# Patient Record
Sex: Male | Born: 1955
Health system: Southern US, Community
[De-identification: ages and names within clinical notes are randomized; demographics above are authoritative.]

## PROBLEM LIST (undated history)

## (undated) DIAGNOSIS — C259 Malignant neoplasm of pancreas, unspecified: Secondary | ICD-10-CM

## (undated) DIAGNOSIS — I2510 Atherosclerotic heart disease of native coronary artery without angina pectoris: Secondary | ICD-10-CM

## (undated) DIAGNOSIS — I4891 Unspecified atrial fibrillation: Secondary | ICD-10-CM

## (undated) DIAGNOSIS — Z9289 Personal history of other medical treatment: Secondary | ICD-10-CM

## (undated) DIAGNOSIS — I639 Cerebral infarction, unspecified: Secondary | ICD-10-CM

## (undated) DIAGNOSIS — I219 Acute myocardial infarction, unspecified: Secondary | ICD-10-CM

## (undated) HISTORY — DX: Personal history of other medical treatment: Z92.89

---
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
## 1998-07-24 ENCOUNTER — Emergency Department (HOSPITAL_COMMUNITY): Admission: EM | Admit: 1998-07-24 | Discharge: 1998-07-24 | Payer: Self-pay

## 1998-12-23 ENCOUNTER — Inpatient Hospital Stay (HOSPITAL_COMMUNITY): Admission: EM | Admit: 1998-12-23 | Discharge: 1998-12-27 | Payer: Self-pay | Admitting: Emergency Medicine

## 1998-12-23 ENCOUNTER — Encounter: Payer: Self-pay | Admitting: Emergency Medicine

## 1999-01-17 ENCOUNTER — Encounter (HOSPITAL_COMMUNITY): Admission: RE | Admit: 1999-01-17 | Discharge: 1999-04-17 | Payer: Self-pay | Admitting: Interventional Cardiology

## 1999-03-20 HISTORY — PX: CORONARY STENT PLACEMENT: SHX1402

## 1999-04-18 ENCOUNTER — Encounter (HOSPITAL_COMMUNITY): Admission: RE | Admit: 1999-04-18 | Discharge: 1999-06-05 | Payer: Self-pay | Admitting: Interventional Cardiology

## 2014-04-19 DIAGNOSIS — I639 Cerebral infarction, unspecified: Secondary | ICD-10-CM

## 2014-04-19 HISTORY — DX: Cerebral infarction, unspecified: I63.9

## 2014-05-04 ENCOUNTER — Inpatient Hospital Stay (HOSPITAL_COMMUNITY): Payer: BC Managed Care – PPO

## 2014-05-04 ENCOUNTER — Encounter (HOSPITAL_COMMUNITY): Payer: Self-pay | Admitting: Radiology

## 2014-05-04 ENCOUNTER — Emergency Department (HOSPITAL_COMMUNITY): Payer: BC Managed Care – PPO

## 2014-05-04 ENCOUNTER — Inpatient Hospital Stay (HOSPITAL_COMMUNITY)
Admission: EM | Admit: 2014-05-04 | Discharge: 2014-05-05 | DRG: 066 | Disposition: A | Discharge status: Home / Self Care | Payer: BC Managed Care – PPO | Attending: Internal Medicine | Admitting: Internal Medicine

## 2014-05-04 DIAGNOSIS — I252 Old myocardial infarction: Secondary | ICD-10-CM | POA: Diagnosis not present

## 2014-05-04 DIAGNOSIS — I4891 Unspecified atrial fibrillation: Secondary | ICD-10-CM | POA: Diagnosis present

## 2014-05-04 DIAGNOSIS — I6789 Other cerebrovascular disease: Secondary | ICD-10-CM

## 2014-05-04 DIAGNOSIS — Z7982 Long term (current) use of aspirin: Secondary | ICD-10-CM

## 2014-05-04 DIAGNOSIS — G459 Transient cerebral ischemic attack, unspecified: Secondary | ICD-10-CM | POA: Diagnosis present

## 2014-05-04 DIAGNOSIS — I25119 Atherosclerotic heart disease of native coronary artery with unspecified angina pectoris: Secondary | ICD-10-CM | POA: Diagnosis present

## 2014-05-04 DIAGNOSIS — Z955 Presence of coronary angioplasty implant and graft: Secondary | ICD-10-CM | POA: Diagnosis not present

## 2014-05-04 DIAGNOSIS — Z9660 Presence of unspecified orthopedic joint implant: Secondary | ICD-10-CM | POA: Diagnosis present

## 2014-05-04 DIAGNOSIS — I10 Essential (primary) hypertension: Secondary | ICD-10-CM | POA: Diagnosis present

## 2014-05-04 DIAGNOSIS — E780 Pure hypercholesterolemia: Secondary | ICD-10-CM | POA: Diagnosis not present

## 2014-05-04 DIAGNOSIS — I63412 Cerebral infarction due to embolism of left middle cerebral artery: Secondary | ICD-10-CM

## 2014-05-04 DIAGNOSIS — I639 Cerebral infarction, unspecified: Principal | ICD-10-CM | POA: Diagnosis present

## 2014-05-04 DIAGNOSIS — I63411 Cerebral infarction due to embolism of right middle cerebral artery: Secondary | ICD-10-CM

## 2014-05-04 DIAGNOSIS — E785 Hyperlipidemia, unspecified: Secondary | ICD-10-CM | POA: Diagnosis present

## 2014-05-04 DIAGNOSIS — Z8673 Personal history of transient ischemic attack (TIA), and cerebral infarction without residual deficits: Secondary | ICD-10-CM | POA: Diagnosis present

## 2014-05-04 DIAGNOSIS — I4819 Other persistent atrial fibrillation: Secondary | ICD-10-CM

## 2014-05-04 DIAGNOSIS — E875 Hyperkalemia: Secondary | ICD-10-CM

## 2014-05-04 HISTORY — DX: Acute myocardial infarction, unspecified: I21.9

## 2014-05-04 HISTORY — DX: Cerebral infarction, unspecified: I63.9

## 2014-05-04 HISTORY — DX: Atherosclerotic heart disease of native coronary artery without angina pectoris: I25.10

## 2014-05-04 LAB — DIFFERENTIAL
BASOS PCT: 1 % (ref 0–1)
Basophils Absolute: 0.1 10*3/uL (ref 0.0–0.1)
EOS ABS: 0.9 10*3/uL — AB (ref 0.0–0.7)
Eosinophils Relative: 9 % — ABNORMAL HIGH (ref 0–5)
Lymphocytes Relative: 36 % (ref 12–46)
Lymphs Abs: 3.5 10*3/uL (ref 0.7–4.0)
MONO ABS: 0.9 10*3/uL (ref 0.1–1.0)
MONOS PCT: 10 % (ref 3–12)
Neutro Abs: 4.3 10*3/uL (ref 1.7–7.7)
Neutrophils Relative %: 44 % (ref 43–77)

## 2014-05-04 LAB — I-STAT CHEM 8, ED
BUN: 13 mg/dL (ref 6–23)
Calcium, Ion: 1.06 mmol/L — ABNORMAL LOW (ref 1.13–1.30)
Chloride: 99 mmol/L (ref 96–112)
Creatinine, Ser: 0.8 mg/dL (ref 0.50–1.35)
Glucose, Bld: 129 mg/dL — ABNORMAL HIGH (ref 70–99)
HCT: 44 % (ref 39.0–52.0)
Hemoglobin: 15 g/dL (ref 13.0–17.0)
Potassium: 3.6 mmol/L (ref 3.5–5.1)
Sodium: 142 mmol/L (ref 135–145)
TCO2: 28 mmol/L (ref 0–100)

## 2014-05-04 LAB — URINALYSIS, ROUTINE W REFLEX MICROSCOPIC
BILIRUBIN URINE: NEGATIVE
Glucose, UA: NEGATIVE mg/dL
Hgb urine dipstick: NEGATIVE
Ketones, ur: NEGATIVE mg/dL
LEUKOCYTES UA: NEGATIVE
NITRITE: NEGATIVE
Protein, ur: NEGATIVE mg/dL
Specific Gravity, Urine: 1.028 (ref 1.005–1.030)
Urobilinogen, UA: 0.2 mg/dL (ref 0.0–1.0)
pH: 8.5 — ABNORMAL HIGH (ref 5.0–8.0)

## 2014-05-04 LAB — COMPREHENSIVE METABOLIC PANEL
ALT: 55 U/L — AB (ref 0–53)
AST: 44 U/L — ABNORMAL HIGH (ref 0–37)
Albumin: 3.8 g/dL (ref 3.5–5.2)
Alkaline Phosphatase: 41 U/L (ref 39–117)
Anion gap: 8 (ref 5–15)
BUN: 12 mg/dL (ref 6–23)
CO2: 31 mmol/L (ref 19–32)
Calcium: 8.9 mg/dL (ref 8.4–10.5)
Chloride: 99 mmol/L (ref 96–112)
Creatinine, Ser: 0.85 mg/dL (ref 0.50–1.35)
GFR calc Af Amer: >90 mL/min (ref >90)
GLUCOSE: 129 mg/dL — AB (ref 70–99)
POTASSIUM: 3.9 mmol/L (ref 3.5–5.1)
Sodium: 138 mmol/L (ref 135–145)
Total Bilirubin: 0.9 mg/dL (ref 0.3–1.2)
Total Protein: 6.4 g/dL (ref 6.0–8.3)

## 2014-05-04 LAB — RAPID URINE DRUG SCREEN, HOSP PERFORMED
Amphetamines: NOT DETECTED
Barbiturates: NOT DETECTED
Benzodiazepines: NOT DETECTED
Cocaine: NOT DETECTED
Opiates: NOT DETECTED
TETRAHYDROCANNABINOL: NOT DETECTED

## 2014-05-04 LAB — LIPID PANEL
CHOLESTEROL: 168 mg/dL (ref 0–200)
HDL: 30 mg/dL — ABNORMAL LOW (ref >39)
LDL CALC: 66 mg/dL (ref 0–99)
TRIGLYCERIDES: 362 mg/dL — AB (ref <150)
Total CHOL/HDL Ratio: 5.6 RATIO
VLDL: 72 mg/dL — ABNORMAL HIGH (ref 0–40)

## 2014-05-04 LAB — CBC
HCT: 48.5 % (ref 39.0–52.0)
HEMOGLOBIN: 15.4 g/dL (ref 13.0–17.0)
MCH: 30.6 pg (ref 26.0–34.0)
MCHC: 31.8 g/dL (ref 30.0–36.0)
MCV: 96.2 fL (ref 78.0–100.0)
PLATELETS: 237 10*3/uL (ref 150–400)
RBC: 5.04 MIL/uL (ref 4.22–5.81)
RDW: 12.8 % (ref 11.5–15.5)
WBC: 9.6 10*3/uL (ref 4.0–10.5)

## 2014-05-04 LAB — TROPONIN I: Troponin I: <0.03 ng/mL (ref <0.031)

## 2014-05-04 LAB — I-STAT TROPONIN, ED: Troponin i, poc: 0 ng/mL (ref 0.00–0.08)

## 2014-05-04 LAB — PROTIME-INR
INR: 0.98 (ref 0.00–1.49)
PROTHROMBIN TIME: 13.1 s (ref 11.6–15.2)

## 2014-05-04 LAB — ETHANOL: Alcohol, Ethyl (B): <5 mg/dL (ref 0–9)

## 2014-05-04 LAB — APTT: aPTT: 31 seconds (ref 24–37)

## 2014-05-04 MED ORDER — SENNOSIDES-DOCUSATE SODIUM 8.6-50 MG PO TABS
1.0000 | ORAL_TABLET | Freq: Every evening | ORAL | Status: DC | PRN
Start: 2014-05-04 — End: 2014-05-05
  Filled 2014-05-04: qty 1

## 2014-05-04 MED ORDER — STROKE: EARLY STAGES OF RECOVERY BOOK
Freq: Once | Status: DC
  Filled 2014-05-04: qty 1

## 2014-05-04 MED ORDER — FLUTICASONE PROPIONATE 50 MCG/ACT NA SUSP
2.0000 | NASAL | Status: DC | PRN
  Administered 2014-05-04: 2 via NASAL
  Filled 2014-05-04 (×2): qty 16

## 2014-05-04 MED ORDER — IOHEXOL 350 MG/ML SOLN
50.0000 mL | Freq: Once | INTRAVENOUS | Status: AC | PRN
  Administered 2014-05-04: 50 mL via INTRAVENOUS

## 2014-05-04 MED ORDER — METOPROLOL TARTRATE 25 MG PO TABS
25.0000 mg | ORAL_TABLET | Freq: Two times a day (BID) | ORAL | Status: DC
  Administered 2014-05-04 (×2): 25 mg via ORAL
  Filled 2014-05-04 (×2): qty 1

## 2014-05-04 MED ORDER — ASPIRIN 325 MG PO TABS
325.0000 mg | ORAL_TABLET | Freq: Every day | ORAL | Status: DC
  Administered 2014-05-04: 325 mg via ORAL
  Filled 2014-05-04: qty 1

## 2014-05-04 MED ORDER — ATORVASTATIN CALCIUM 10 MG PO TABS
10.0000 mg | ORAL_TABLET | Freq: Every day | ORAL | Status: DC
  Administered 2014-05-04: 10 mg via ORAL
  Filled 2014-05-04 (×2): qty 1

## 2014-05-04 MED ORDER — CLOPIDOGREL BISULFATE 75 MG PO TABS: 75.0000 mg | ORAL_TABLET | Freq: Every day | ORAL | Status: DC

## 2014-05-04 MED ORDER — NITROGLYCERIN 0.4 MG SL SUBL: 0.4000 mg | SUBLINGUAL_TABLET | SUBLINGUAL | Status: DC | PRN

## 2014-05-04 MED ORDER — HEPARIN SODIUM (PORCINE) 5000 UNIT/ML IJ SOLN
5000.0000 [IU] | Freq: Three times a day (TID) | INTRAMUSCULAR | Status: DC
  Administered 2014-05-04 (×3): 5000 [IU] via SUBCUTANEOUS
  Filled 2014-05-04 (×4): qty 1

## 2014-05-04 MED ORDER — ASPIRIN 300 MG RE SUPP: 300.0000 mg | Freq: Every day | RECTAL | Status: DC

## 2014-05-04 MED ORDER — ENOXAPARIN SODIUM 30 MG/0.3ML ~~LOC~~ SOLN
30.0000 mg | SUBCUTANEOUS | Status: DC
  Filled 2014-05-04: qty 0.3

## 2014-05-04 NOTE — ED Notes (Addendum)
Pt got up to use the bathroom around 1015 and vomited X 1, pt went back to sleep and then awoken, pt wife reported slurred speech and pt staring off. Pt with R sided deficits and facial droop

## 2014-05-04 NOTE — Consult Note (Signed)
Neurology Consultation Reason for Consult: Aphasia Referring Physician: Margaretha Seeds  CC: Aphasia  History is obtained from:patient, wife  HPI: Casey Pierce is a 59 y.o. male with a history of heart disease who was in his normal state of health on going to bed at 10:15pm and then around 12am was returning from the bathroom and found himself unable to speak. He did complain of having diarrhea earlier in the night.   He states that he takes aspirin as his only blood thinner.  Initially, on presentation to the emergency room he was partially aphasic with right facial droop and discoordination of his right arm. He was taken for CT scan which appears to demonstrate a thrombus in the insular branch of the left MCA, however on CT angiogram appears that this thrombus is moved significantly distally.  This corresponded exactly in time to the patient's clinical improvement with rapid resolution of all symptoms except for mild dysarthria and right hand numbness.   LKW: 10:15 pm 2/15 tpa given?: no, rapidly improving symptoms    ROS: A 14 point ROS was performed and is negative except as noted in the HPI.   PMH: Heart disease High cholesterol  Family History: Grandmother - stroke  Social History: Tob: Denies  Exam: Current vital signs: BP 153/71 mmHg  Pulse 79  Temp(Src) 97.8 F (36.6 C)  Resp 18  Ht 5\' 9"  (1.753 m)  Wt 81.647 kg (180 lb)  BMI 26.57 kg/m2  SpO2 95% Vital signs in last 24 hours: Temp:  [97.8 F (36.6 C)] 97.8 F (36.6 C) (02/16 0147) Pulse Rate:  [79-90] 79 (02/16 0145) Resp:  [18-23] 18 (02/16 0145) BP: (141-153)/(71-77) 153/71 mmHg (02/16 0145) SpO2:  [95 %-98 %] 95 % (02/16 0145) Weight:  [81.647 kg (180 lb)] 81.647 kg (180 lb) (02/16 0125)   Physical Exam  Constitutional: Appears well-developed and well-nourished.  Psych: Affect appropriate to situation Eyes: No scleral injection HENT: No OP obstrucion Head: Normocephalic.  Cardiovascular: Normal rate  and regular rhythm.  Respiratory: Effort normal and breath sounds normal to anterior ascultation GI: Soft.  No distension. There is no tenderness.  Skin: WDI   The following is a post-improvement exam. Neuro: Mental Status: Patient is awake, alert, oriented to person, place, month, year, and situation. Patient is able to give a clear and coherent history. No signs of aphasia or neglect He has mild dysarthria Cranial Nerves: II: Visual Fields are full. Pupils are equal, round, and reactive to light.   III,IV, VI: EOMI without ptosis or diploplia.  V: Facial sensation is symmetric to temperature VII: Facial movement is symmetric.  VIII: hearing is intact to voice X: Uvula elevates symmetrically XI: Shoulder shrug is symmetric. XII: tongue is midline without atrophy or fasciculations.  Motor: Tone is normal. Bulk is normal. 5/5 strength was present in all four extremities.  Sensory: Sensation is decreased to pin in the right hand.  Plantars: Toes are downgoing bilaterally.  Cerebellar: FNF and HKS are intact bilaterally   I have reviewed labs in epic and the results pertinent to this consultation are: Chem 8 - nml creatinine  I have reviewed the images obtained: CT head - hyperdense mca branch  Impression: 59 year old male with likely embolic transient ischemic attack versus mild stroke. He will need a workup for possible sources of emboli. He was on aspirin prior to arrival.  Recommendations: 1. HgbA1c, fasting lipid panel 2. MRI of the brain without MRA 3. Frequent neuro checks 4. Echocardiogram 5. Carotid Dopplers  are not needed given the CT angiogram 6. Prophylactic therapy-Antiplatelet med: Aspirin - dose 325mg  PO or 300mg  PR 7. Risk factor modification 8. Telemetry monitoring 9. PT consult, OT consult, Speech consult    Roland Rack, MD [facility] [phone]  If 7pm- 7am, please page neurology on call as listed in [place].

## 2014-05-04 NOTE — Progress Notes (Signed)
Pt arrived to 4N14 alert and oriented X4. Tele placed and CCMD notified. Oriented to room and questions answered. Will continue to monitor. Bobbye Charleston, RN

## 2014-05-04 NOTE — Progress Notes (Signed)
Echocardiogram 2D Echocardiogram has been performed.  Casey Pierce 05/04/2014, 9:36 AM

## 2014-05-04 NOTE — Code Documentation (Addendum)
Casey Pierce is a 59yo wm presenting to the ED with c/o garbled speech after an episode of N/V/D.  He got in bed around 2215 and got up at midnight to use the restroom.  Casey Pierce had an episode of V/D & he family noticed his speech was garbled & he seemed weak on th right side.  Initially he was an NIH 5 but began to make improvements and by the time he got out of the scanner he had improved.

## 2014-05-04 NOTE — Progress Notes (Signed)
STROKE TEAM PROGRESS NOTE   HISTORY Casey Pierce is a 59 y.o. male with a history of heart disease who was in his normal state of health on going to bed at 10:15pm and then around 12am was returning from the bathroom and found himself unable to speak. He did complain of having diarrhea earlier in the night.   He states that he takes aspirin as his only blood thinner.  Initially, on presentation to the emergency room he was partially aphasic with right facial droop and discoordination of his right arm. He was taken for CT scan which appears to demonstrate a thrombus in the insular branch of the left MCA, however on CT angiogram appears that this thrombus is moved significantly distally.  This corresponded exactly in time to the patient's clinical improvement with rapid resolution of all symptoms except for mild dysarthria and right hand numbness.  LKW: 10:15 pm 2/15 tpa given?: no, rapidly improving symptoms  SUBJECTIVE (INTERVAL HISTORY) His wife is at the bedside.  Overall he feels his condition has improved and resolved. He stated that he had an MRI 16 years ago, no palpitation or heart racing. Denies weight loss, abdominal pain, chest pain, GI bleeding.  OBJECTIVE Temp:  [97.8 F (36.6 C)-98.8 F (37.1 C)] 98.4 F (36.9 C) (02/16 1047) Pulse Rate:  [64-90] 64 (02/16 1047) Cardiac Rhythm:  [-]  Resp:  [14-23] 14 (02/16 1047) BP: (120-153)/(61-85) 128/75 mmHg (02/16 1047) SpO2:  [93 %-98 %] 95 % (02/16 1047) Weight:  [81.647 kg (180 lb)] 81.647 kg (180 lb) (02/16 0125)  No results for input(s): GLUCAP in the last 168 hours.  Recent Labs Lab 05/04/14 0110 05/04/14 0112  NA 138 142  K 3.9 3.6  CL 99 99  CO2 31  --   GLUCOSE 129* 129*  BUN 12 13  CREATININE 0.85 0.80  CALCIUM 8.9  --     Recent Labs Lab 05/04/14 0110  AST 44*  ALT 55*  ALKPHOS 41  BILITOT 0.9  PROT 6.4  ALBUMIN 3.8    Recent Labs Lab 05/04/14 0110 05/04/14 0112  WBC 9.6  --   NEUTROABS 4.3   --   HGB 15.4 15.0  HCT 48.5 44.0  MCV 96.2  --   PLT 237  --     Recent Labs Lab 05/04/14 0110  TROPONINI <0.03    Recent Labs  05/04/14 0110  LABPROT 13.1  INR 0.98    Recent Labs  05/04/14 0315  COLORURINE YELLOW  LABSPEC 1.028  PHURINE 8.5*  GLUCOSEU NEGATIVE  HGBUR NEGATIVE  BILIRUBINUR NEGATIVE  KETONESUR NEGATIVE  PROTEINUR NEGATIVE  UROBILINOGEN 0.2  NITRITE NEGATIVE  LEUKOCYTESUR NEGATIVE       Component Value Date/Time   CHOL 168 05/04/2014 0110   TRIG 362* 05/04/2014 0110   HDL 30* 05/04/2014 0110   CHOLHDL 5.6 05/04/2014 0110   VLDL 72* 05/04/2014 0110   LDLCALC 66 05/04/2014 0110   No results found for: HGBA1C    Component Value Date/Time   LABOPIA NONE DETECTED 05/04/2014 0315   COCAINSCRNUR NONE DETECTED 05/04/2014 0315   LABBENZ NONE DETECTED 05/04/2014 0315   AMPHETMU NONE DETECTED 05/04/2014 0315   THCU NONE DETECTED 05/04/2014 0315   LABBARB NONE DETECTED 05/04/2014 0315     Recent Labs Lab 05/04/14 0110  ETH <5    Ct Angio head and Neck W/cm &/or Wo/cm  05/04/2014  IMPRESSION: CT head: Insular. Sign concerning for LEFT MCA thrombus without large vessel occlusion.  CTA  neck: Mild intimal thickening and calcific atherosclerosis without hemodynamically significant stenosis.  CTA head: Tiny left M3 branch occlusion, this appears to suggest migration of the M3 thrombus during the examination.    Mri and Mra Head/brain Wo Cm  05/04/2014   IMPRESSION: MRI HEAD: 2 subcentimeter foci of acute ischemia in LEFT middle cerebral artery territory. Linear susceptibility artifact in LEFT M3 branch consistent with thromboembolic disease.  Otherwise normal MRI of the brain for age.  MRA HEAD: Thready irregular LEFT M3 segment and slightly decreased distal LEFT middle cerebral artery branches. No large vessel occlusion.    Echo: - Left ventricle: The cavity size was normal. Wall thickness was normal. Systolic function was normal. The estimated  ejection fraction was in the range of 50% to 55%. Wall motion was normal; there were no regional wall motion abnormalities. Left ventricular diastolic function parameters were normal. - Atrial septum: No defect or patent foramen ovale was identified.  Impressions: - No cardiac source of emboli was indentified.  TEE: Pending  LE venous doppler - pending  EKG: Pending  PHYSICAL EXAM Constitutional: Appears well-developed and well-nourished.  Psych: Affect appropriate to situation Eyes: No scleral injection HENT: No OP obstrucion Head: Normocephalic.  Cardiovascular: Normal rate and regular rhythm.  Respiratory: Effort normal and breath sounds normal to anterior ascultation GI: Soft. No distension. There is no tenderness.  Skin: WDI  Neuro: Mental Status: Patient is awake, alert, oriented to person, place, month, year, and situation. Patient is able to give a clear and coherent history. No signs of aphasia or neglect He has mild dysarthria Cranial Nerves: II: Visual Fields are full. Pupils are equal, round, and reactive to light.  III,IV, VI: EOMI without ptosis or diploplia.  V: Facial sensation is symmetric to temperature VII: Facial movement is symmetric.  VIII: hearing is intact to voice X: Uvula elevates symmetrically XI: Shoulder shrug is symmetric. XII: tongue is midline without atrophy or fasciculations.  Motor: Tone is normal. Bulk is normal. 5/5 strength was present in all four extremities.  Sensory: Sensation is decreased to pin in the right hand.  Plantars: Toes are downgoing bilaterally.  Cerebellar: FNF and HKS are intact bilaterally Gait normal  ASSESSMENT/PLAN [name] is a 59 y.o. male with history of MI status post stent in 2000 presenting with aphasia with right facial droop and discoordination of his right arm. He did not receive IV t-PA due to rapidly improving symptoms.   Stroke - small acute infarct at the left  insular cortex, consistent with embolic stroke, source unclear.     MRI as above  MRA - Linear susceptibility artifact in LEFT M3 branch consistent with thromboembolic disease.  2D Echo  unremarkable  LE venous Doppler pending  TEE with Loop Pending. NPO after midnight.  LDL 66, at the goal  HgbA1c pending  Heparin for VTE prophylaxis  Diet Heart  Diet NPO time specified thin liquids  aspirin 81 mg orally every day prior to admission, now on clopidogrel 75 mg orally every day. Continue Plavix at discharge  Patient counseled to be compliant with his antithrombotic medications  Ongoing aggressive stroke risk factor management  Therapy recommendations:  Pending   Disposition:  Pending   Hypertension  Home meds:  Metoprolol  Stable  Patient counseled to be compliant with his blood pressure medications  Hyperlipidemia  Home meds:  Lipitor 20 daily resumed in hospital  LDL 66, goal < 70  Under control  Continue statin at discharge  Other Stroke  Risk Factors  Coronary artery disease s/p stent placement  Hospital day # 0  Patient seen and discussed with Dr. Lerry Liner, PA-C Department of Neurology [facility].  I, the attending vascular neurologist, have personally obtained a history, examined the patient, evaluated laboratory data, individually viewed imaging studies and agree with radiology interpretations. Together with the NP/PA, we formulated the assessment and plan of care which reflects our mutual decision.  I have made any additions or clarifications directly to the above note and agree with the findings and plan as currently documented.   59 year old male with history of CAD status post stenting presented with aphasia with right-sided deficit. MRI showed left insular cortex infarct, consistent with embolic stroke. CTA neck did not show significant carotid artery stenosis. Concerning for cardioembolic stroke. Will do TEE with  potential loop recorder. Pending lower extremity DVT and A1c. On Plavix and Lipitor.  Rosalin Hawking, MD PhD Stroke Neurology 05/04/2014 4:27 PM    To contact Stroke Continuity provider, please refer to [email or web address] After hours, contact General Neurology

## 2014-05-04 NOTE — Progress Notes (Signed)
PT Cancellation Note  Patient Details Name: Casey Pierce MRN: [id number] DOB: 09-May-1955   Cancelled Treatment:    Reason Eval/Treat Not Completed: Pt currently on bedrest until 14:22 today. Also, PT orders are to begin 05/05/14 0700.  MD, if appropriate, please update activity orders AND start time for PT evaluation.    Daysha Ashmore 05/04/2014, 8:05 AM Pager [phone]

## 2014-05-04 NOTE — Progress Notes (Signed)
Patient seen and examined, patient admitted by Dr. Ernestina Patches this morning  Brief history 59 year old male admitted with slurred speech, right facial droop and numbness of his right arm. CT scan showed thrombus in the insular branch of left MCA MRI showed 2 subcentimeter foci of acute ischemia in the left middle cerebral artery territory.  BP 115/76 mmHg  Pulse 60  Temp(Src) 98.2 F (36.8 C) (Oral)  Resp 16  Ht 5\' 9"  (1.753 m)  Wt 81.647 kg (180 lb)  BMI 26.57 kg/m2  SpO2 96%   Patient seen and examined, and agree with assessment and plan per Dr. Ernestina Patches Neurology consulted, continue stroke Workup Discussed with patient and his wife at the bedside in detail.   Becket Wecker M.D. [facility] 05/04/2014, 12:51 PM  Pager: [phone]

## 2014-05-04 NOTE — ED Provider Notes (Signed)
CSN: [id number]     Arrival date & time 05/04/14  0106 History  This chart was scribed for Kalman Drape, MD by Randa Evens, ED Scribe. This patient was seen in room D33C/D33C and the patient's care was started at 1:10 AM.     Chief Complaint  Patient presents with  . Code Stroke   The history is provided by the EMS personnel. No language interpreter was used.   HPI Comments: Casey Pierce is a 59 y.o. male brought in by ambulance, who presents to the Emergency Department for stroke like symptoms. Per EMS pt was last seen normal at 12 AM 1 hour PTA. EMS states that pt started out with nausea and vomiting. EMS states that shortly after pt became non verbal and any time he was asked to speak he would speak in [place]. EMS states that he showed weakness in his right hand as well. Per wife, she last spoke to him around 1045.  Pt reports he knew just before going to bed that something was not right.  Pt had n/v x 1.  Prior h/o CAD/MI 16 years ago.  Pt with left facial droop, left sided weakness, and word finding difficulty.  No prior h/o stroke.     History reviewed. No pertinent past medical history. No past surgical history on file. No family history on file. History  Substance Use Topics  . Smoking status: Never Smoker   . Smokeless tobacco: Not on file  . Alcohol Use: No    Review of Systems  Neurological: Positive for speech difficulty and weakness.    Allergies  Review of patient's allergies indicates no known allergies.  Home Medications   Prior to Admission medications   Medication Sig Start Date End Date Taking? Authorizing Provider  aspirin EC 81 MG tablet Take 81 mg by mouth daily.   Yes Historical Provider, MD  atorvastatin (LIPITOR) 20 MG tablet Take 10 mg by mouth daily.   Yes Historical Provider, MD  metoprolol tartrate (LOPRESSOR) 25 MG tablet Take 25 mg by mouth 2 (two) times daily.   Yes Historical Provider, MD  nitroGLYCERIN (NITROSTAT) 0.4 MG SL tablet Place 0.4  mg under the tongue every 5 (five) minutes as needed for chest pain.   Yes Historical Provider, MD  Omega-3 Fatty Acids (FISH OIL PO) Take 1 tablet by mouth daily.   Yes Historical Provider, MD   Triage Vitals: Pulse 90  Resp 18  Ht 5\' 9"  (1.753 m)  Wt 180 lb (81.647 kg)  BMI 26.57 kg/m2  SpO2 98%   Physical Exam  Constitutional: He is oriented to person, place, and time. He appears well-developed and well-nourished. He appears distressed (anxious appearing).  HENT:  Head: Normocephalic and atraumatic.  Right Ear: External ear normal.  Left Ear: External ear normal.  Nose: Nose normal.  Mouth/Throat: Oropharynx is clear and moist.  Eyes: Conjunctivae and EOM are normal. Pupils are equal, round, and reactive to light.  Neck: Normal range of motion. Neck supple. No JVD present. No tracheal deviation present. No thyromegaly present.  Cardiovascular: Normal rate, regular rhythm, normal heart sounds and intact distal pulses.  Exam reveals no gallop and no friction rub.   No murmur heard. Pulmonary/Chest: Effort normal and breath sounds normal. No stridor. No respiratory distress. He has no wheezes. He has no rales. He exhibits no tenderness.  Abdominal: Soft. Bowel sounds are normal. He exhibits no distension and no mass. There is no tenderness. There is no rebound and no  guarding.  Musculoskeletal: Normal range of motion. He exhibits no edema or tenderness.  Lymphadenopathy:    He has no cervical adenopathy.  Neurological: He is alert and oriented to person, place, and time. He displays normal reflexes. A cranial nerve deficit (right sided facial droop) is present. He exhibits normal muscle tone. Coordination (right sided weakness 3/5) abnormal.  Slurred speech, word finding difficulty  Skin: Skin is warm and dry. No rash noted. No erythema. No pallor.  Psychiatric: He has a normal mood and affect. His behavior is normal. Judgment and thought content normal.  Nursing note and vitals  reviewed.   ED Course  Procedures (including critical care time) DIAGNOSTIC STUDIES: Oxygen Saturation is 98% on RA, normal by my interpretation.    COORDINATION OF CARE:    Labs Review Labs Reviewed  DIFFERENTIAL - Abnormal; Notable for the following:    Eosinophils Relative 9 (*)    Eosinophils Absolute 0.9 (*)    All other components within normal limits  COMPREHENSIVE METABOLIC PANEL - Abnormal; Notable for the following:    Glucose, Bld 129 (*)    AST 44 (*)    ALT 55 (*)    All other components within normal limits  I-STAT CHEM 8, ED - Abnormal; Notable for the following:    Glucose, Bld 129 (*)    Calcium, Ion 1.06 (*)    All other components within normal limits  ETHANOL  PROTIME-INR  APTT  CBC  TROPONIN I  URINE RAPID DRUG SCREEN (HOSP PERFORMED)  URINALYSIS, ROUTINE W REFLEX MICROSCOPIC  HEMOGLOBIN A1C  LIPID PANEL  CBC  CREATININE, SERUM  I-STAT TROPOININ, ED  I-STAT TROPOININ, ED  I-STAT CHEM 8, ED    Imaging Review Ct Angio Head W/cm &/or Wo Cm  05/04/2014   CLINICAL DATA:  Nausea and vomiting beginning 1 hour prior to arrival in the ER, subsequently nonverbal then altered, RIGHT hand weakness.  EXAM: CT ANGIOGRAPHY HEAD AND NECK  TECHNIQUE: Multidetector CT imaging of the head and neck was performed using the standard protocol during bolus administration of intravenous contrast. Multiplanar CT image reconstructions and MIPs were obtained to evaluate the vascular anatomy. Carotid stenosis measurements (when applicable) are obtained utilizing NASCET criteria, using the distal internal carotid diameter as the denominator.  CONTRAST:  37mL OMNIPAQUE IOHEXOL 350 MG/ML SOLN  COMPARISON:  None.  FINDINGS: CT HEAD  The ventricles and sulci are normal. No intraparenchymal hemorrhage, mass effect nor midline shift. No acute large vascular territory infarcts. LEFT insular. Spine, axial 14/32.  No abnormal extra-axial fluid collections. Basal cisterns are patent.  No  skull fracture. The included ocular globes and orbital contents are non-suspicious ; Bilateral drusen. Paranasal sinus mucosal thickening without air-fluid levels. Mastoid air cells are well aerated.  CTA NECK  Normal appearance of the thoracic arch, normal branch pattern. The origins of the innominate, left Common carotid artery and subclavian artery are widely patent.  Bilateral Common carotid arteries are widely patent, coursing in a straight line fashion. Eccentric RIGHT greater LEFT intimal thickening of the distal Common carotid arteries, trace calcific atherosclerosis of the LEFT carotid bulb. Normal appearance of the carotid bifurcations without hemodynamically significant stenosis by NASCET criteria. Normal appearance of the included internal carotid arteries. Bilateral tonsillar loops.  RIGHT vertebral artery is dominant. Normal appearance of the vertebral arteries, which appear widely patent. Mild extrinsic compression due to uncovertebral hypertrophy and facet arthropathy.  No hemodynamically significant stenosis by NASCET criteria. No dissection, no pseudoaneurysm. No abnormal luminal  irregularity. No contrast extravasation.  Fatty replaced parotid glands with small intra parotid lymph nodes. No acute osseous process though bone windows have not been submitted.  CTA HEAD  Anterior circulation: Normal appearance of the cervical internal carotid arteries, petrous, cavernous and supra clinoid internal carotid arteries. Mild calcific atherosclerosis of the carotid siphons. Widely patent anterior communicating artery. Normal appearance of the anterior cerebral arteries. Focally occluded branch of left M3 branch.  Posterior circulation: RIGHT vertebral artery is dominant with normal appearance of the vertebral arteries, vertebrobasilar junction and basilar artery, as well as main branch vessels. RIGHT P1 infundibulum. LEFT vertebral artery terminates in the LEFT posterior-inferior cerebellar artery. Robust  RIGHT posterior communicating artery present. Normal appearance of the posterior cerebral arteries.  No large vessel occlusion, hemodynamically significant stenosis, dissection, luminal irregularity, contrast extravasation or aneurysm within the anterior nor posterior circulation.  IMPRESSION: CT head: Insular. Sign concerning for LEFT MCA thrombus without large vessel occlusion.  CTA neck: Mild intimal thickening and calcific atherosclerosis without hemodynamically significant stenosis.  CTA head: Tiny left M3 branch occlusion, this appears to suggest migration of the M3 thrombus during the examination.  Acute findings discussed with and reconfirmed by Dr.Kirkpatrick on 05/04/2014 at 1:53 am.   Electronically Signed   By: Elon Alas   On: 05/04/2014 01:54   Ct Angio Neck W/cm &/or Wo/cm  05/04/2014   CLINICAL DATA:  Nausea and vomiting beginning 1 hour prior to arrival in the ER, subsequently nonverbal then altered, RIGHT hand weakness.  EXAM: CT ANGIOGRAPHY HEAD AND NECK  TECHNIQUE: Multidetector CT imaging of the head and neck was performed using the standard protocol during bolus administration of intravenous contrast. Multiplanar CT image reconstructions and MIPs were obtained to evaluate the vascular anatomy. Carotid stenosis measurements (when applicable) are obtained utilizing NASCET criteria, using the distal internal carotid diameter as the denominator.  CONTRAST:  19mL OMNIPAQUE IOHEXOL 350 MG/ML SOLN  COMPARISON:  None.  FINDINGS: CT HEAD  The ventricles and sulci are normal. No intraparenchymal hemorrhage, mass effect nor midline shift. No acute large vascular territory infarcts. LEFT insular. Spine, axial 14/32.  No abnormal extra-axial fluid collections. Basal cisterns are patent.  No skull fracture. The included ocular globes and orbital contents are non-suspicious ; Bilateral drusen. Paranasal sinus mucosal thickening without air-fluid levels. Mastoid air cells are well aerated.  CTA NECK   Normal appearance of the thoracic arch, normal branch pattern. The origins of the innominate, left Common carotid artery and subclavian artery are widely patent.  Bilateral Common carotid arteries are widely patent, coursing in a straight line fashion. Eccentric RIGHT greater LEFT intimal thickening of the distal Common carotid arteries, trace calcific atherosclerosis of the LEFT carotid bulb. Normal appearance of the carotid bifurcations without hemodynamically significant stenosis by NASCET criteria. Normal appearance of the included internal carotid arteries. Bilateral tonsillar loops.  RIGHT vertebral artery is dominant. Normal appearance of the vertebral arteries, which appear widely patent. Mild extrinsic compression due to uncovertebral hypertrophy and facet arthropathy.  No hemodynamically significant stenosis by NASCET criteria. No dissection, no pseudoaneurysm. No abnormal luminal irregularity. No contrast extravasation.  Fatty replaced parotid glands with small intra parotid lymph nodes. No acute osseous process though bone windows have not been submitted.  CTA HEAD  Anterior circulation: Normal appearance of the cervical internal carotid arteries, petrous, cavernous and supra clinoid internal carotid arteries. Mild calcific atherosclerosis of the carotid siphons. Widely patent anterior communicating artery. Normal appearance of the anterior cerebral arteries. Focally occluded branch  of left M3 branch.  Posterior circulation: RIGHT vertebral artery is dominant with normal appearance of the vertebral arteries, vertebrobasilar junction and basilar artery, as well as main branch vessels. RIGHT P1 infundibulum. LEFT vertebral artery terminates in the LEFT posterior-inferior cerebellar artery. Robust RIGHT posterior communicating artery present. Normal appearance of the posterior cerebral arteries.  No large vessel occlusion, hemodynamically significant stenosis, dissection, luminal irregularity, contrast  extravasation or aneurysm within the anterior nor posterior circulation.  IMPRESSION: CT head: Insular. Sign concerning for LEFT MCA thrombus without large vessel occlusion.  CTA neck: Mild intimal thickening and calcific atherosclerosis without hemodynamically significant stenosis.  CTA head: Tiny left M3 branch occlusion, this appears to suggest migration of the M3 thrombus during the examination.  Acute findings discussed with and reconfirmed by Dr.Kirkpatrick on 05/04/2014 at 1:53 am.   Electronically Signed   By: Elon Alas   On: 05/04/2014 01:54     EKG Interpretation   Date/Time:  Tuesday May 04 2014 01:28:46 EST Ventricular Rate:  87 PR Interval:  239 QRS Duration: 106 QT Interval:  395 QTC Calculation: 475 R Axis:   -58 Text Interpretation:  Sinus rhythm Atrial premature complex Prolonged PR  interval Probable left atrial enlargement Incomplete RBBB and LAFB  Consider right ventricular hypertrophy Borderline prolonged QT interval No  old tracing to compare Confirmed by Jossette Zirbel  MD, Zailey Audia ([id number]) on 05/04/2014  1:30:46 AM     CRITICAL CARE Performed by: Kalman Drape Total critical care time: 30 min Critical care time was exclusive of separately billable procedures and treating other patients. Critical care was necessary to treat or prevent imminent or life-threatening deterioration. Critical care was time spent personally by me on the following activities: development of treatment plan with patient and/or surrogate as well as nursing, discussions with consultants, evaluation of patient's response to treatment, examination of patient, obtaining history from patient or surrogate, ordering and performing treatments and interventions, ordering and review of laboratory studies, ordering and review of radiographic studies, pulse oximetry and re-evaluation of patient's condition.  MDM   Final diagnoses:  Transient cerebral ischemia, unspecified transient cerebral ischemia type     I personally performed the services described in this documentation, which was scribed in my presence. The recorded information has been reviewed and is accurate.  Pt with onset of symptoms around 1045 pm, presents with right sided facial droop, right sided weakness, slurred speech with word finding difficulty.  After CTA neck/head, pt with complete resolution of symptoms.  Not TPA candidate.  Clot seen to move from Left MCA to M3 distribution on two head CTs.  To be admitted to hospitalist service, Dr Leonel Ramsay has seen in consult.     Kalman Drape, MD 05/04/14 [phone]

## 2014-05-04 NOTE — H&P (Signed)
Hospitalist Admission History and Physical  Patient name: Casey Pierce Medical record number: [id number] Date of birth: Oct 17, 1955 Age: 59 y.o. Gender: male  Primary Care Provider: Irven Shelling, MD  Chief Complaint: CVA History of Present Illness:This is a 59 y.o. year old male with significant past medical history of CAD s/p MI, HLD  presenting with CVA. Per report, pt was found by daughter around midnight non verbal, w/ R sided facial droop and unable to lift up R hand. Symptoms persisted on presentation to the ER. A code stroke was called. Pt had head CT that initially showed L MCA thrombus. However, after IV contrast was given for Head CTA, thrombus had appeared to migrate to small M3 branch w/ subsequent complete resolution of sxs.  Pt denies any prior hx/o CVA in the past. Non smoker. Reports compliance w/ medication at home. No reported illicit drug use. Hemodynamically stable on presentation. CBC, CMET, trop grossly WNL.   Assessment and Plan: Casey Pierce is a 59 y.o. year old male presenting with stroke   Active Problems:   Stroke   TIA (transient ischemic attack)   1- Stroke -focal deficits on exam clinically resolved  -proceed down stroke pathway including MRI, MRA, 2D ECHO, carotid dopplers, risk stratification labs -full dose ASA -f/u neuro recs  2- HTN -BP stable  -cont home regimen   3-HLD -check lipid panel  -cont lipitor  FEN/GI: NPO pending bedside swallow eval  Prophylaxis: lovenox  Disposition: pending further evaluation  Code Status:Full Code    Patient Active Problem List   Diagnosis Date Noted  . Stroke 05/04/2014  . TIA (transient ischemic attack) 05/04/2014   Past Medical History: History reviewed. No pertinent past medical history.  Past Surgical History: Past Surgical History  Procedure Laterality Date  . Joint replacement      Social History: History   Social History  . Marital Status: Married    Spouse Name: N/A  .  Number of Children: N/A  . Years of Education: N/A   Social History Main Topics  . Smoking status: Never Smoker   . Smokeless tobacco: Not on file  . Alcohol Use: No  . Drug Use: Not on file  . Sexual Activity: Not on file   Other Topics Concern  . None   Social History Narrative  . None    Family History: No family history on file.  Allergies: No Known Allergies  Current Facility-Administered Medications  Medication Dose Route Frequency Provider Last Rate Last Dose  .  stroke: mapping our early stages of recovery book   Does not apply Once Shanda Howells, MD      . aspirin suppository 300 mg  300 mg Rectal Daily Shanda Howells, MD       Or  . aspirin tablet 325 mg  325 mg Oral Daily Shanda Howells, MD      . atorvastatin (LIPITOR) tablet 10 mg  10 mg Oral Daily Shanda Howells, MD      . enoxaparin (LOVENOX) injection 30 mg  30 mg Subcutaneous Q24H Shanda Howells, MD      . metoprolol tartrate (LOPRESSOR) tablet 25 mg  25 mg Oral BID Shanda Howells, MD      . nitroGLYCERIN (NITROSTAT) SL tablet 0.4 mg  0.4 mg Sublingual Q5 min PRN Shanda Howells, MD      . senna-docusate (Senokot-S) tablet 1 tablet  1 tablet Oral QHS PRN Shanda Howells, MD       Current Outpatient Prescriptions  Medication Sig Dispense  Refill  . aspirin EC 81 MG tablet Take 81 mg by mouth daily.    Marland Kitchen atorvastatin (LIPITOR) 20 MG tablet Take 10 mg by mouth daily.    . metoprolol tartrate (LOPRESSOR) 25 MG tablet Take 25 mg by mouth 2 (two) times daily.    . nitroGLYCERIN (NITROSTAT) 0.4 MG SL tablet Place 0.4 mg under the tongue every 5 (five) minutes as needed for chest pain.    . Omega-3 Fatty Acids (FISH OIL PO) Take 1 tablet by mouth daily.     Review Of Systems: 12 point ROS negative except as noted above in HPI.  Physical Exam: Filed Vitals:   05/04/14 0230  BP: 134/85  Pulse: 78  Temp:   Resp: 18    General: alert and cooperative HEENT: PERRLA and extra ocular movement intact Heart: S1, S2  normal, no murmur, rub or gallop, regular rate and rhythm Lungs: clear to auscultation, no wheezes or rales and unlabored breathing Abdomen: abdomen is soft without significant tenderness, masses, organomegaly or guarding Extremities: extremities normal, atraumatic, no cyanosis or edema Skin:no rashes, no ecchymoses Neurology: normal without focal findings  Labs and Imaging: Lab Results  Component Value Date/Time   NA 142 05/04/2014 01:12 AM   K 3.6 05/04/2014 01:12 AM   CL 99 05/04/2014 01:12 AM   CO2 31 05/04/2014 01:10 AM   BUN 13 05/04/2014 01:12 AM   CREATININE 0.80 05/04/2014 01:12 AM   GLUCOSE 129* 05/04/2014 01:12 AM   Lab Results  Component Value Date   WBC 9.6 05/04/2014   HGB 15.0 05/04/2014   HCT 44.0 05/04/2014   MCV 96.2 05/04/2014   PLT 237 05/04/2014    Ct Angio Head W/cm &/or Wo Cm  05/04/2014   CLINICAL DATA:  Nausea and vomiting beginning 1 hour prior to arrival in the ER, subsequently nonverbal then altered, RIGHT hand weakness.  EXAM: CT ANGIOGRAPHY HEAD AND NECK  TECHNIQUE: Multidetector CT imaging of the head and neck was performed using the standard protocol during bolus administration of intravenous contrast. Multiplanar CT image reconstructions and MIPs were obtained to evaluate the vascular anatomy. Carotid stenosis measurements (when applicable) are obtained utilizing NASCET criteria, using the distal internal carotid diameter as the denominator.  CONTRAST:  91mL OMNIPAQUE IOHEXOL 350 MG/ML SOLN  COMPARISON:  None.  FINDINGS: CT HEAD  The ventricles and sulci are normal. No intraparenchymal hemorrhage, mass effect nor midline shift. No acute large vascular territory infarcts. LEFT insular. Spine, axial 14/32.  No abnormal extra-axial fluid collections. Basal cisterns are patent.  No skull fracture. The included ocular globes and orbital contents are non-suspicious ; Bilateral drusen. Paranasal sinus mucosal thickening without air-fluid levels. Mastoid air cells  are well aerated.  CTA NECK  Normal appearance of the thoracic arch, normal branch pattern. The origins of the innominate, left Common carotid artery and subclavian artery are widely patent.  Bilateral Common carotid arteries are widely patent, coursing in a straight line fashion. Eccentric RIGHT greater LEFT intimal thickening of the distal Common carotid arteries, trace calcific atherosclerosis of the LEFT carotid bulb. Normal appearance of the carotid bifurcations without hemodynamically significant stenosis by NASCET criteria. Normal appearance of the included internal carotid arteries. Bilateral tonsillar loops.  RIGHT vertebral artery is dominant. Normal appearance of the vertebral arteries, which appear widely patent. Mild extrinsic compression due to uncovertebral hypertrophy and facet arthropathy.  No hemodynamically significant stenosis by NASCET criteria. No dissection, no pseudoaneurysm. No abnormal luminal irregularity. No contrast extravasation.  Fatty replaced  parotid glands with small intra parotid lymph nodes. No acute osseous process though bone windows have not been submitted.  CTA HEAD  Anterior circulation: Normal appearance of the cervical internal carotid arteries, petrous, cavernous and supra clinoid internal carotid arteries. Mild calcific atherosclerosis of the carotid siphons. Widely patent anterior communicating artery. Normal appearance of the anterior cerebral arteries. Focally occluded branch of left M3 branch.  Posterior circulation: RIGHT vertebral artery is dominant with normal appearance of the vertebral arteries, vertebrobasilar junction and basilar artery, as well as main branch vessels. RIGHT P1 infundibulum. LEFT vertebral artery terminates in the LEFT posterior-inferior cerebellar artery. Robust RIGHT posterior communicating artery present. Normal appearance of the posterior cerebral arteries.  No large vessel occlusion, hemodynamically significant stenosis, dissection,  luminal irregularity, contrast extravasation or aneurysm within the anterior nor posterior circulation.  IMPRESSION: CT head: Insular. Sign concerning for LEFT MCA thrombus without large vessel occlusion.  CTA neck: Mild intimal thickening and calcific atherosclerosis without hemodynamically significant stenosis.  CTA head: Tiny left M3 branch occlusion, this appears to suggest migration of the M3 thrombus during the examination.  Acute findings discussed with and reconfirmed by Dr.Kirkpatrick on 05/04/2014 at 1:53 am.   Electronically Signed   By: Elon Alas   On: 05/04/2014 01:54   Ct Angio Neck W/cm &/or Wo/cm  05/04/2014   CLINICAL DATA:  Nausea and vomiting beginning 1 hour prior to arrival in the ER, subsequently nonverbal then altered, RIGHT hand weakness.  EXAM: CT ANGIOGRAPHY HEAD AND NECK  TECHNIQUE: Multidetector CT imaging of the head and neck was performed using the standard protocol during bolus administration of intravenous contrast. Multiplanar CT image reconstructions and MIPs were obtained to evaluate the vascular anatomy. Carotid stenosis measurements (when applicable) are obtained utilizing NASCET criteria, using the distal internal carotid diameter as the denominator.  CONTRAST:  20mL OMNIPAQUE IOHEXOL 350 MG/ML SOLN  COMPARISON:  None.  FINDINGS: CT HEAD  The ventricles and sulci are normal. No intraparenchymal hemorrhage, mass effect nor midline shift. No acute large vascular territory infarcts. LEFT insular. Spine, axial 14/32.  No abnormal extra-axial fluid collections. Basal cisterns are patent.  No skull fracture. The included ocular globes and orbital contents are non-suspicious ; Bilateral drusen. Paranasal sinus mucosal thickening without air-fluid levels. Mastoid air cells are well aerated.  CTA NECK  Normal appearance of the thoracic arch, normal branch pattern. The origins of the innominate, left Common carotid artery and subclavian artery are widely patent.  Bilateral  Common carotid arteries are widely patent, coursing in a straight line fashion. Eccentric RIGHT greater LEFT intimal thickening of the distal Common carotid arteries, trace calcific atherosclerosis of the LEFT carotid bulb. Normal appearance of the carotid bifurcations without hemodynamically significant stenosis by NASCET criteria. Normal appearance of the included internal carotid arteries. Bilateral tonsillar loops.  RIGHT vertebral artery is dominant. Normal appearance of the vertebral arteries, which appear widely patent. Mild extrinsic compression due to uncovertebral hypertrophy and facet arthropathy.  No hemodynamically significant stenosis by NASCET criteria. No dissection, no pseudoaneurysm. No abnormal luminal irregularity. No contrast extravasation.  Fatty replaced parotid glands with small intra parotid lymph nodes. No acute osseous process though bone windows have not been submitted.  CTA HEAD  Anterior circulation: Normal appearance of the cervical internal carotid arteries, petrous, cavernous and supra clinoid internal carotid arteries. Mild calcific atherosclerosis of the carotid siphons. Widely patent anterior communicating artery. Normal appearance of the anterior cerebral arteries. Focally occluded branch of left M3 branch.  Posterior circulation:  RIGHT vertebral artery is dominant with normal appearance of the vertebral arteries, vertebrobasilar junction and basilar artery, as well as main branch vessels. RIGHT P1 infundibulum. LEFT vertebral artery terminates in the LEFT posterior-inferior cerebellar artery. Robust RIGHT posterior communicating artery present. Normal appearance of the posterior cerebral arteries.  No large vessel occlusion, hemodynamically significant stenosis, dissection, luminal irregularity, contrast extravasation or aneurysm within the anterior nor posterior circulation.  IMPRESSION: CT head: Insular. Sign concerning for LEFT MCA thrombus without large vessel occlusion.   CTA neck: Mild intimal thickening and calcific atherosclerosis without hemodynamically significant stenosis.  CTA head: Tiny left M3 branch occlusion, this appears to suggest migration of the M3 thrombus during the examination.  Acute findings discussed with and reconfirmed by Dr.Kirkpatrick on 05/04/2014 at 1:53 am.   Electronically Signed   By: Elon Alas   On: 05/04/2014 01:54           Shanda Howells MD  Pager: [phone]

## 2014-05-05 ENCOUNTER — Encounter (HOSPITAL_COMMUNITY)
Admission: EM | Disposition: A | Discharge status: Home / Self Care | Payer: Self-pay | Source: Home / Self Care | Attending: Internal Medicine

## 2014-05-05 ENCOUNTER — Other Ambulatory Visit: Payer: Self-pay | Admitting: Neurology

## 2014-05-05 ENCOUNTER — Ambulatory Visit (HOSPITAL_COMMUNITY): Admit: 2014-05-05 | Payer: Self-pay | Admitting: Cardiology

## 2014-05-05 ENCOUNTER — Encounter (HOSPITAL_COMMUNITY): Payer: Self-pay

## 2014-05-05 DIAGNOSIS — I480 Paroxysmal atrial fibrillation: Secondary | ICD-10-CM

## 2014-05-05 DIAGNOSIS — I63411 Cerebral infarction due to embolism of right middle cerebral artery: Secondary | ICD-10-CM

## 2014-05-05 DIAGNOSIS — I4819 Other persistent atrial fibrillation: Secondary | ICD-10-CM

## 2014-05-05 LAB — BASIC METABOLIC PANEL
Anion gap: 3 — ABNORMAL LOW (ref 5–15)
BUN: 10 mg/dL (ref 6–23)
CHLORIDE: 105 mmol/L (ref 96–112)
CO2: 28 mmol/L (ref 19–32)
CREATININE: 0.81 mg/dL (ref 0.50–1.35)
Calcium: 8.9 mg/dL (ref 8.4–10.5)
GFR calc non Af Amer: >90 mL/min (ref >90)
GLUCOSE: 112 mg/dL — AB (ref 70–99)
POTASSIUM: 3.8 mmol/L (ref 3.5–5.1)
Sodium: 136 mmol/L (ref 135–145)

## 2014-05-05 LAB — CBC
HCT: 44.3 % (ref 39.0–52.0)
Hemoglobin: 15.3 g/dL (ref 13.0–17.0)
MCH: 30.1 pg (ref 26.0–34.0)
MCHC: 34.5 g/dL (ref 30.0–36.0)
MCV: 87.2 fL (ref 78.0–100.0)
PLATELETS: 230 10*3/uL (ref 150–400)
RBC: 5.08 MIL/uL (ref 4.22–5.81)
RDW: 12.6 % (ref 11.5–15.5)
WBC: 9 10*3/uL (ref 4.0–10.5)

## 2014-05-05 LAB — HEMOGLOBIN A1C
Hgb A1c MFr Bld: 6.3 % — ABNORMAL HIGH (ref 4.8–5.6)
MEAN PLASMA GLUCOSE: 134 mg/dL

## 2014-05-05 SURGERY — ECHOCARDIOGRAM, TRANSESOPHAGEAL
Anesthesia: Moderate Sedation

## 2014-05-05 SURGERY — Surgical Case
Anesthesia: *Unknown

## 2014-05-05 SURGERY — LOOP RECORDER IMPLANT
Anesthesia: LOCAL

## 2014-05-05 MED ORDER — APIXABAN 5 MG PO TABS
5.0000 mg | ORAL_TABLET | Freq: Two times a day (BID) | ORAL | Status: DC
  Administered 2014-05-05: 5 mg via ORAL
  Filled 2014-05-05: qty 1

## 2014-05-05 MED ORDER — APIXABAN 5 MG PO TABS: 5.0000 mg | ORAL_TABLET | Freq: Two times a day (BID) | ORAL | Status: DC

## 2014-05-05 MED ORDER — IBUPROFEN 200 MG PO TABS
800.0000 mg | ORAL_TABLET | Freq: Once | ORAL | Status: AC
  Administered 2014-05-05: 800 mg via ORAL
  Filled 2014-05-05: qty 4

## 2014-05-05 NOTE — Progress Notes (Addendum)
STROKE TEAM PROGRESS NOTE   HISTORY Casey Pierce is a 59 y.o. male with a history of heart disease who was in his normal state of health on going to bed at 10:15pm and then around 12am was returning from the bathroom and found himself unable to speak. He did complain of having diarrhea earlier in the night.   He states that he takes aspirin as his only blood thinner.  Initially, on presentation to the emergency room he was partially aphasic with right facial droop and discoordination of his right arm. He was taken for CT scan which appears to demonstrate a thrombus in the insular branch of the left MCA, however on CT angiogram appears that this thrombus is moved significantly distally.  This corresponded exactly in time to the patient's clinical improvement with rapid resolution of all symptoms except for mild dysarthria and right hand numbness.  LKW: 10:15 pm 2/15 tpa given?: no, rapidly improving symptoms  SUBJECTIVE (INTERVAL HISTORY) His wife is at the bedside.  Overall he feels his condition has resolved. No issues overnight as per patient and nursing staff. EKG today revealed A-fib, his TEE and loop were then cancelled. Put on eliquis.  OBJECTIVE Temp:  [97.5 F (36.4 C)-98.4 F (36.9 C)] 97.5 F (36.4 C) (02/17 1035) Pulse Rate:  [57-78] 76 (02/17 1035) Cardiac Rhythm:  [-] Heart block (02/17 0800) Resp:  [15-18] 16 (02/17 1035) BP: (101-128)/(58-90) 128/90 mmHg (02/17 1035) SpO2:  [95 %-98 %] 97 % (02/17 1035)  No results for input(s): GLUCAP in the last 168 hours.  Recent Labs Lab 05/04/14 0110 05/04/14 0112 05/05/14 0621  NA 138 142 136  K 3.9 3.6 3.8  CL 99 99 105  CO2 31  --  28  GLUCOSE 129* 129* 112*  BUN 12 13 10   CREATININE 0.85 0.80 0.81  CALCIUM 8.9  --  8.9    Recent Labs Lab 05/04/14 0110  AST 44*  ALT 55*  ALKPHOS 41  BILITOT 0.9  PROT 6.4  ALBUMIN 3.8    Recent Labs Lab 05/04/14 0110 05/04/14 0112 05/05/14 0621  WBC 9.6  --  9.0   NEUTROABS 4.3  --   --   HGB 15.4 15.0 15.3  HCT 48.5 44.0 44.3  MCV 96.2  --  87.2  PLT 237  --  230    Recent Labs Lab 05/04/14 0110  TROPONINI <0.03    Recent Labs  05/04/14 0110  LABPROT 13.1  INR 0.98    Recent Labs  05/04/14 0315  COLORURINE YELLOW  LABSPEC 1.028  PHURINE 8.5*  GLUCOSEU NEGATIVE  HGBUR NEGATIVE  BILIRUBINUR NEGATIVE  KETONESUR NEGATIVE  PROTEINUR NEGATIVE  UROBILINOGEN 0.2  NITRITE NEGATIVE  LEUKOCYTESUR NEGATIVE       Component Value Date/Time   CHOL 168 05/04/2014 0110   TRIG 362* 05/04/2014 0110   HDL 30* 05/04/2014 0110   CHOLHDL 5.6 05/04/2014 0110   VLDL 72* 05/04/2014 0110   LDLCALC 66 05/04/2014 0110   Lab Results  Component Value Date   HGBA1C 6.3* 05/04/2014      Component Value Date/Time   LABOPIA NONE DETECTED 05/04/2014 0315   COCAINSCRNUR NONE DETECTED 05/04/2014 0315   LABBENZ NONE DETECTED 05/04/2014 0315   AMPHETMU NONE DETECTED 05/04/2014 0315   THCU NONE DETECTED 05/04/2014 0315   LABBARB NONE DETECTED 05/04/2014 0315     Recent Labs Lab 05/04/14 0110  ETH <5   I have personally reviewed the radiological images below and agree with the  radiology interpretations.  Ct Angio head and Neck W/cm &/or Wo/cm  05/04/2014  IMPRESSION: CT head: Insular. Sign concerning for LEFT MCA thrombus without large vessel occlusion.  CTA neck: Mild intimal thickening and calcific atherosclerosis without hemodynamically significant stenosis.  CTA head: Tiny left M3 branch occlusion, this appears to suggest migration of the M3 thrombus during the examination.    Mri and Mra Head/brain Wo Cm  05/04/2014   IMPRESSION: MRI HEAD: 2 subcentimeter foci of acute ischemia in LEFT middle cerebral artery territory. Linear susceptibility artifact in LEFT M3 branch consistent with thromboembolic disease.  Otherwise normal MRI of the brain for age.  MRA HEAD: Thready irregular LEFT M3 segment and slightly decreased distal LEFT middle  cerebral artery branches. No large vessel occlusion.    Echo: - Left ventricle: The cavity size was normal. Wall thickness was normal. Systolic function was normal. The estimated ejection fraction was in the range of 50% to 55%. Wall motion was normal; there were no regional wall motion abnormalities. Left ventricular diastolic function parameters were normal. - Atrial septum: No defect or patent foramen ovale was identified. Impressions: - No cardiac source of emboli was indentified.  LE venous doppler - Neg  EKG: Afib captured on 05/05/14  PHYSICAL EXAM Constitutional: Appears well-developed and well-nourished.  Psych: Affect appropriate to situation Eyes: No scleral injection HENT: No OP obstrucion Head: Normocephalic.  Cardiovascular: Normal rate and regular rhythm.  Respiratory: Effort normal and breath sounds normal to anterior ascultation GI: Soft. No distension. There is no tenderness.  Skin: WDI  Neuro: Mental Status: Patient is awake, alert, oriented to person, place, month, year, and situation. Patient is able to give a clear and coherent history. No signs of aphasia or neglect He has mild dysarthria Cranial Nerves: II: Visual Fields are full. Pupils are equal, round, and reactive to light.  III,IV, VI: EOMI without ptosis or diploplia.  V: Facial sensation is symmetric to temperature VII: Facial movement is symmetric.  VIII: hearing is intact to voice X: Uvula elevates symmetrically XI: Shoulder shrug is symmetric. XII: tongue is midline without atrophy or fasciculations.  Motor: Tone is normal. Bulk is normal. 5/5 strength was present in all four extremities.  Sensory: Sensation is decreased to pin in the right hand.  Plantars: Toes are downgoing bilaterally.  Cerebellar: FNF and HKS are intact bilaterally Gait normal  ASSESSMENT/PLAN [name] is a 59 y.o. male with history of MI status post stent in 2000 presenting with  aphasia with right facial droop and discoordination of his right arm. He did not receive IV t-PA due to rapidly improving symptoms.   Stroke - small acute infarct at the left insular cortex, consistent with embolic stroke from new onset afib seen on EKG  MRI as above  MRA - Linear susceptibility artifact in LEFT M3 branch consistent with thromboembolic disease.  2D Echo  unremarkable  LE venous Doppler Neg  LDL 66, at the goal  HgbA1c 6.3  Heparin for VTE prophylaxis  Diet Heart thin liquids  aspirin 81 mg orally every day prior to admission, now on eliquis (apixaban). Continue eliquis at discharge  Patient counseled to be compliant with his antithrombotic medications  Ongoing aggressive stroke risk factor management  Therapy recommendations: Home   Disposition:  home  Afib  Found on EKG and tele today  Put on eliqis  D/c ASA  Continue metoprolol for rate control  Hypertension  Home meds:  Metoprolol  Stable  Patient counseled to be compliant  with his blood pressure medications  Hyperlipidemia  Home meds:  Lipitor 20 daily resumed in hospital  LDL 66, goal < 70  Under control  Continue statin at discharge  Put on fish oil for high TG  Other Stroke Risk Factors  Coronary artery disease s/p stent placement  Hospital day # 1  Patient seen and discussed with Dr. Erlinda Hong No other Neurology recommendations at this time. We will sign off.  Lanelle Bal, PA-C Department of Neurology [facility].  05/05/2014 12:28 PM  I, the attending vascular neurologist, have personally obtained a history, examined the patient, evaluated laboratory data, individually viewed imaging studies and agree with radiology interpretations. I also discussed with Dr. Tyrell Antonio and Dr. [name] regarding his care plan. Together with the NP/PA, we formulated the assessment and plan of care which reflects our mutual decision.  I have made any additions or clarifications  directly to the above note and agree with the findings and plan as currently documented.   59 yo M with PMH of HLD and CAD s/p stent admitted for left cortical small stroke, embolic pattern and was due to afib (newly captured on EKG). Change ASA to eliquis. Continue lipitor and metoprolol. Comply with medications. Follow up in clinic.  Neurology will sign off. Please call with questions. Pt will follow up with Dr. Erlinda Hong at [facility] in about 2 months. Thanks for the consult.   Rosalin Hawking, MD PhD Stroke Neurology 05/05/2014 11:02 PM        To contact Stroke Continuity provider, please refer to [email or web address] After hours, contact General Neurology

## 2014-05-05 NOTE — Progress Notes (Signed)
Patient [place] home via car with wife.  DC instructions and prescription were given to patient and fully understood.  Vital signs and assessments were stable.

## 2014-05-05 NOTE — Care Management Note (Signed)
    Page 1 of 1   05/05/2014     3:37:26 PM CARE MANAGEMENT NOTE 05/05/2014  Patient:  Casey Pierce,Casey Pierce   Account Number:  [id number]  Date Initiated:  05/05/2014  Documentation initiated by:  Lorne Skeens  Subjective/Objective Assessment:   Patient was admitted with CVA.  Lives at home with spouse.     Action/Plan:   Will follow for discharge needs pending PT/OT evals and physician orders.   Anticipated DC Date:     Anticipated DC Plan:  [place]         Choice offered to / List presented to:             Status of service:  In process, will continue to follow Medicare Important Message given?   (If response is "NO", the following Medicare IM given date fields will be blank) Date Medicare IM given:   Medicare IM given by:   Date Additional Medicare IM given:   Additional Medicare IM given by:    Discharge Disposition:  HOME/SELF CARE  Per UR Regulation:  Reviewed for med. necessity/level of care/duration of stay  If discussed at [place] of Stay Meetings, dates discussed:    Comments:  05/05/14 Lorne Skeens RN, MSN, CM-  BENEFITS CHECK RESULTS:  [place] is covered. No Prior Authorization required. Retail Pharmacy Co-Payment is $64.00. Available at [facility], [facility], [facility], etc.  05/05/14 Lorne Skeens RN, MSN, CM- Patient was given Eliquis 30 day free trial and $10 copay cards.  Awaiting results of benefits check to determine in prior authorization is required.

## 2014-05-05 NOTE — Consult Note (Signed)
ELECTROPHYSIOLOGY CONSULT NOTE  Patient ID: Casey Pierce MRN: [id number], DOB/AGE: [birth year]   Admit date: 05/04/2014 Date of Consult: 05/05/2014  Primary Physician: Casey Shelling, MD Primary Cardiologist: Casey Pierce: Cryptogenic stroke; recommendations regarding Implantable Loop Recorder  History of Present Illness Casey Pierce was admitted on 05/04/2014 with acute CVA. Telemetry monitoring today demonstrates atrial fibrillation with a controlled ventricular response. He is completely asymptomatic regarding his arrthymia. Neurologic deficits have resolved.  His CHA2DS2 VASc is 2.    Past Medical History Past Medical History  Diagnosis Date  . Coronary artery disease   . Myocardial infarction   . Stroke 04/2014    Past Surgical History Past Surgical History  Procedure Laterality Date  . Joint replacement    . Coronary stent placement  2001    Allergies/Intolerances No Known Allergies Inpatient Medications .  stroke: mapping our early stages of recovery book   Does not apply Once  . atorvastatin  10 mg Oral Daily  . clopidogrel  75 mg Oral Daily  . heparin subcutaneous  5,000 Units Subcutaneous 3 times per day  . metoprolol tartrate  25 mg Oral BID     Social History History   Social History  . Marital Status: Married    Spouse Name: N/A  . Number of Children: N/A  . Years of Education: N/A   Occupational History  . Not on file.   Social History Main Topics  . Smoking status: Never Smoker   . Smokeless tobacco: Former Systems developer    Types: Chew     Comment: quit chewing 7 years ago  . Alcohol Use: No  . Drug Use: No  . Sexual Activity: Not on file   Other Topics Concern  . Not on file   Social History Narrative  . No narrative on file    Review of Systems General: No chills, fever, night sweats or weight changes  Cardiovascular:  No chest pain, dyspnea on exertion, edema, orthopnea, palpitations, paroxysmal nocturnal  dyspnea Dermatological: No rash, lesions or masses Respiratory: No cough, dyspnea Urologic: No hematuria, dysuria Abdominal: No nausea, vomiting, diarrhea, bright red blood per rectum, melena, or hematemesis Neurologic: No visual changes, weakness, changes in mental status All other systems reviewed and are otherwise negative except as noted above.  Physical Exam Blood pressure 128/90, pulse 76, temperature 97.5 F (36.4 C), temperature source Oral, resp. rate 16, height 5\' 9"  (1.753 m), weight 180 lb (81.647 kg), SpO2 97 %.  General: Well developed, well appearing 59 y.o. male in no acute distress. HEENT: Normocephalic, atraumatic. EOMs intact. Sclera nonicteric. Oropharynx clear.  Neck: Supple without bruits. No JVD. Lungs: Respirations regular and unlabored, CTA bilaterally. No wheezes, rales or rhonchi. Heart: RRR. S1, S2 present. No murmurs, rub, S3 or S4. Abdomen: Soft, non-tender, non-distended. BS present x 4 quadrants. No hepatosplenomegaly.  Extremities: No clubbing, cyanosis or edema. DP/PT/Radials 2+ and equal bilaterally. Psych: Normal affect. Neuro: Alert and oriented X 3. Moves all extremities spontaneously. Musculoskeletal: No kyphosis. Skin: Intact. Warm and dry. No rashes or petechiae in exposed areas.   Labs Lab Results  Component Value Date   WBC 9.0 05/05/2014   HGB 15.3 05/05/2014   HCT 44.3 05/05/2014   MCV 87.2 05/05/2014   PLT 230 05/05/2014    Recent Labs Lab 05/04/14 0110  05/05/14 0621  NA 138  < > 136  K 3.9  < > 3.8  CL 99  < > 105  CO2 31  --  28  BUN 12  < > 10  CREATININE 0.85  < > 0.81  CALCIUM 8.9  --  8.9  PROT 6.4  --   --   BILITOT 0.9  --   --   ALKPHOS 41  --   --   ALT 55*  --   --   AST 44*  --   --   GLUCOSE 129*  < > 112*  < > = values in this interval not displayed.  Recent Labs  05/04/14 0110  INR 0.98    Radiology/Studies Ct Angio Head W/cm &/or Wo Cm  05/04/2014   CLINICAL DATA:  Nausea and vomiting beginning 1  hour prior to arrival in the ER, subsequently nonverbal then altered, RIGHT hand weakness.  EXAM: CT ANGIOGRAPHY HEAD AND NECK  TECHNIQUE: Multidetector CT imaging of the head and neck was performed using the standard protocol during bolus administration of intravenous contrast. Multiplanar CT image reconstructions and MIPs were obtained to evaluate the vascular anatomy. Carotid stenosis measurements (when applicable) are obtained utilizing NASCET criteria, using the distal internal carotid diameter as the denominator.  CONTRAST:  38mL OMNIPAQUE IOHEXOL 350 MG/ML SOLN  COMPARISON:  None.  FINDINGS: CT HEAD  The ventricles and sulci are normal. No intraparenchymal hemorrhage, mass effect nor midline shift. No acute large vascular territory infarcts. LEFT insular. Spine, axial 14/32.  No abnormal extra-axial fluid collections. Basal cisterns are patent.  No skull fracture. The included ocular globes and orbital contents are non-suspicious ; Bilateral drusen. Paranasal sinus mucosal thickening without air-fluid levels. Mastoid air cells are well aerated.  CTA NECK  Normal appearance of the thoracic arch, normal branch pattern. The origins of the innominate, left Common carotid artery and subclavian artery are widely patent.  Bilateral Common carotid arteries are widely patent, coursing in a straight line fashion. Eccentric RIGHT greater LEFT intimal thickening of the distal Common carotid arteries, trace calcific atherosclerosis of the LEFT carotid bulb. Normal appearance of the carotid bifurcations without hemodynamically significant stenosis by NASCET criteria. Normal appearance of the included internal carotid arteries. Bilateral tonsillar loops.  RIGHT vertebral artery is dominant. Normal appearance of the vertebral arteries, which appear widely patent. Mild extrinsic compression due to uncovertebral hypertrophy and facet arthropathy.  No hemodynamically significant stenosis by NASCET criteria. No dissection, no  pseudoaneurysm. No abnormal luminal irregularity. No contrast extravasation.  Fatty replaced parotid glands with small intra parotid lymph nodes. No acute osseous process though bone windows have not been submitted.  CTA HEAD  Anterior circulation: Normal appearance of the cervical internal carotid arteries, petrous, cavernous and supra clinoid internal carotid arteries. Mild calcific atherosclerosis of the carotid siphons. Widely patent anterior communicating artery. Normal appearance of the anterior cerebral arteries. Focally occluded branch of left M3 branch.  Posterior circulation: RIGHT vertebral artery is dominant with normal appearance of the vertebral arteries, vertebrobasilar junction and basilar artery, as well as main branch vessels. RIGHT P1 infundibulum. LEFT vertebral artery terminates in the LEFT posterior-inferior cerebellar artery. Robust RIGHT posterior communicating artery present. Normal appearance of the posterior cerebral arteries.  No large vessel occlusion, hemodynamically significant stenosis, dissection, luminal irregularity, contrast extravasation or aneurysm within the anterior nor posterior circulation.  IMPRESSION: CT head: Insular. Sign concerning for LEFT MCA thrombus without large vessel occlusion.  CTA neck: Mild intimal thickening and calcific atherosclerosis without hemodynamically significant stenosis.  CTA head: Tiny left M3 branch occlusion, this appears to suggest migration of the M3 thrombus during the examination.  Acute findings discussed with  and reconfirmed by Dr.Kirkpatrick on 05/04/2014 at 1:53 am.   Electronically Signed   By: Elon Alas   On: 05/04/2014 01:54   Ct Angio Neck W/cm &/or Wo/cm  05/04/2014   CLINICAL DATA:  Nausea and vomiting beginning 1 hour prior to arrival in the ER, subsequently nonverbal then altered, RIGHT hand weakness.  EXAM: CT ANGIOGRAPHY HEAD AND NECK  TECHNIQUE: Multidetector CT imaging of the head and neck was performed using the  standard protocol during bolus administration of intravenous contrast. Multiplanar CT image reconstructions and MIPs were obtained to evaluate the vascular anatomy. Carotid stenosis measurements (when applicable) are obtained utilizing NASCET criteria, using the distal internal carotid diameter as the denominator.  CONTRAST:  68mL OMNIPAQUE IOHEXOL 350 MG/ML SOLN  COMPARISON:  None.  FINDINGS: CT HEAD  The ventricles and sulci are normal. No intraparenchymal hemorrhage, mass effect nor midline shift. No acute large vascular territory infarcts. LEFT insular. Spine, axial 14/32.  No abnormal extra-axial fluid collections. Basal cisterns are patent.  No skull fracture. The included ocular globes and orbital contents are non-suspicious ; Bilateral drusen. Paranasal sinus mucosal thickening without air-fluid levels. Mastoid air cells are well aerated.  CTA NECK  Normal appearance of the thoracic arch, normal branch pattern. The origins of the innominate, left Common carotid artery and subclavian artery are widely patent.  Bilateral Common carotid arteries are widely patent, coursing in a straight line fashion. Eccentric RIGHT greater LEFT intimal thickening of the distal Common carotid arteries, trace calcific atherosclerosis of the LEFT carotid bulb. Normal appearance of the carotid bifurcations without hemodynamically significant stenosis by NASCET criteria. Normal appearance of the included internal carotid arteries. Bilateral tonsillar loops.  RIGHT vertebral artery is dominant. Normal appearance of the vertebral arteries, which appear widely patent. Mild extrinsic compression due to uncovertebral hypertrophy and facet arthropathy.  No hemodynamically significant stenosis by NASCET criteria. No dissection, no pseudoaneurysm. No abnormal luminal irregularity. No contrast extravasation.  Fatty replaced parotid glands with small intra parotid lymph nodes. No acute osseous process though bone windows have not been  submitted.  CTA HEAD  Anterior circulation: Normal appearance of the cervical internal carotid arteries, petrous, cavernous and supra clinoid internal carotid arteries. Mild calcific atherosclerosis of the carotid siphons. Widely patent anterior communicating artery. Normal appearance of the anterior cerebral arteries. Focally occluded branch of left M3 branch.  Posterior circulation: RIGHT vertebral artery is dominant with normal appearance of the vertebral arteries, vertebrobasilar junction and basilar artery, as well as main branch vessels. RIGHT P1 infundibulum. LEFT vertebral artery terminates in the LEFT posterior-inferior cerebellar artery. Robust RIGHT posterior communicating artery present. Normal appearance of the posterior cerebral arteries.  No large vessel occlusion, hemodynamically significant stenosis, dissection, luminal irregularity, contrast extravasation or aneurysm within the anterior nor posterior circulation.  IMPRESSION: CT head: Insular. Sign concerning for LEFT MCA thrombus without large vessel occlusion.  CTA neck: Mild intimal thickening and calcific atherosclerosis without hemodynamically significant stenosis.  CTA head: Tiny left M3 branch occlusion, this appears to suggest migration of the M3 thrombus during the examination.  Acute findings discussed with and reconfirmed by Dr.Kirkpatrick on 05/04/2014 at 1:53 am.   Electronically Signed   By: Elon Alas   On: 05/04/2014 01:54   Mr Brain Wo Contrast  05/04/2014   CLINICAL DATA:  Acute onset RIGHT facial droop and aphasia at midnight, suspect LEFT MCA thrombosis.  EXAM: MRI HEAD WITHOUT CONTRAST  MRA HEAD WITHOUT CONTRAST  TECHNIQUE: Multiplanar, multiecho pulse sequences of the brain  and surrounding structures were obtained without intravenous contrast. Angiographic images of the head were obtained using MRA technique without contrast.  COMPARISON:  CT of the head and CTA May 04, 2014 at 1:13 a.m.  FINDINGS: MRI HEAD  FINDINGS  Subcentimeter focus of reduced diffusion within the LEFT temporal lobe, superior gyrus. Faint linear reduced diffusion within the LEFT parietal lobe, post central gyrus. No susceptibility artifact to suggest hemorrhage. Linear susceptibility artifact in LEFT M3 small branch, corresponding to occlusion on CTA of same day, reported separately.  Ventricles and sulci are normal for patient's age. A few subcentimeter supratentorial white matter T2 hyperintensities are less than expected for age in the nonspecific may reflect chronic small vessel ischemic disease. Nor mass lesions or mass effect.  No abnormal extra-axial fluid collections. Major intracranial vascular flow voids seen at the skull base. FLAIR bright signal within it LEFT M3 segment and distal branches.  Mild to moderate paranasal sinus mucosal thickening with sphenoid sinus air-fluid level. The mastoid air cells are well aerated. Ocular globes and orbital contents are unremarkable. No abnormal sellar expansion. No cerebellar tonsillar ectopia.  MRA HEAD FINDINGS  Anterior circulation: Normal flow related enhancement of the included cervical, petrous, cavernous and supra clinoid internal carotid arteries. Patent anterior communicating artery. Normal flow related enhancement of the anterior and RIGHT middle cerebral arteries, including more distal segments. Robust RIGHT posterior communicating artery. Thready irregular LEFT M3 segment and slightly decreased distal LEFT middle cerebral artery branches.  No large vessel occlusion, high-grade stenosis, abnormal luminal irregularity, aneurysm.  Posterior circulation: RIGHT vertebral artery is dominant. LEFT vertebral artery appears to terminate in the posterior inferior cerebellar artery. Basilar artery is patent, with normal flow related enhancement of the main branch vessels. RIGHT T1 infundibulum. Normal flow related enhancement of the posterior cerebral arteries.  No large vessel occlusion,  high-grade stenosis, abnormal luminal irregularity, aneurysm.  IMPRESSION: MRI HEAD: 2 subcentimeter foci of acute ischemia in LEFT middle cerebral artery territory. Linear susceptibility artifact in LEFT M3 branch consistent with thromboembolic disease.  Otherwise normal MRI of the brain for age.  MRA HEAD: Thready irregular LEFT M3 segment and slightly decreased distal LEFT middle cerebral artery branches. No large vessel occlusion.   Electronically Signed   By: Elon Alas   On: 05/04/2014 04:06   Mr Jodene Nam Head/brain Wo Cm  05/04/2014   CLINICAL DATA:  Acute onset RIGHT facial droop and aphasia at midnight, suspect LEFT MCA thrombosis.  EXAM: MRI HEAD WITHOUT CONTRAST  MRA HEAD WITHOUT CONTRAST  TECHNIQUE: Multiplanar, multiecho pulse sequences of the brain and surrounding structures were obtained without intravenous contrast. Angiographic images of the head were obtained using MRA technique without contrast.  COMPARISON:  CT of the head and CTA May 04, 2014 at 1:13 a.m.  FINDINGS: MRI HEAD FINDINGS  Subcentimeter focus of reduced diffusion within the LEFT temporal lobe, superior gyrus. Faint linear reduced diffusion within the LEFT parietal lobe, post central gyrus. No susceptibility artifact to suggest hemorrhage. Linear susceptibility artifact in LEFT M3 small branch, corresponding to occlusion on CTA of same day, reported separately.  Ventricles and sulci are normal for patient's age. A few subcentimeter supratentorial white matter T2 hyperintensities are less than expected for age in the nonspecific may reflect chronic small vessel ischemic disease. Nor mass lesions or mass effect.  No abnormal extra-axial fluid collections. Major intracranial vascular flow voids seen at the skull base. FLAIR bright signal within it LEFT M3 segment and distal branches.  Mild to moderate  paranasal sinus mucosal thickening with sphenoid sinus air-fluid level. The mastoid air cells are well aerated. Ocular globes  and orbital contents are unremarkable. No abnormal sellar expansion. No cerebellar tonsillar ectopia.  MRA HEAD FINDINGS  Anterior circulation: Normal flow related enhancement of the included cervical, petrous, cavernous and supra clinoid internal carotid arteries. Patent anterior communicating artery. Normal flow related enhancement of the anterior and RIGHT middle cerebral arteries, including more distal segments. Robust RIGHT posterior communicating artery. Thready irregular LEFT M3 segment and slightly decreased distal LEFT middle cerebral artery branches.  No large vessel occlusion, high-grade stenosis, abnormal luminal irregularity, aneurysm.  Posterior circulation: RIGHT vertebral artery is dominant. LEFT vertebral artery appears to terminate in the posterior inferior cerebellar artery. Basilar artery is patent, with normal flow related enhancement of the main branch vessels. RIGHT T1 infundibulum. Normal flow related enhancement of the posterior cerebral arteries.  No large vessel occlusion, high-grade stenosis, abnormal luminal irregularity, aneurysm.  IMPRESSION: MRI HEAD: 2 subcentimeter foci of acute ischemia in LEFT middle cerebral artery territory. Linear susceptibility artifact in LEFT M3 branch consistent with thromboembolic disease.  Otherwise normal MRI of the brain for age.  MRA HEAD: Thready irregular LEFT M3 segment and slightly decreased distal LEFT middle cerebral artery branches. No large vessel occlusion.   Electronically Signed   By: Elon Alas   On: 05/04/2014 04:06    Echocardiogram  2D echo 05/04/14 Study Conclusions  - Left ventricle: The cavity size was normal. Wall thickness was normal. Systolic function was normal. The estimated ejection fraction was in the range of 50% to 55%. Wall motion was normal; there were no regional wall motion abnormalities. Left ventricular diastolic function parameters were normal. - Atrial septum: No defect or patent foramen  ovale was identified.  Impressions:  - No cardiac source of emboli was indentified.    12-lead ECG  Atrial Fibrillation w/ a controlled ventricular response.   Telemetry Atrial Fibrillation w/ a controlled ventricular response.    Assessment and Plan  1. CVA: Telemetry captured atrial fibrillation today. This has been confirmed by 12 lead EKG. Neurology notified. We will cancel TEE and loop implant.   2. Atrial Fibrillation: controlled ventricular response. Asymptomatic. Continue metoprolol for rate control. With recent CVA, his CHA2DS2 VASc is at least 2. Recommend oral anticoagulation. Xarelto vs Eliquis. Will defer to neurology.   Signed, SIMMONS, BRITTAINY 05/05/2014, 10:50 AM  As above. Patient will need anticoagulation. Agree with the choice of apixaban.  His CAD hx is not available presently and will need to understand this as it relates to issues of anticoagulation/antiplatlet therapy  He has hypertrigyceridemia which may need attention  Will follow in office

## 2014-05-05 NOTE — Evaluation (Signed)
Occupational Therapy Evaluation Patient Details Name: Casey Pierce MRN: [id number] DOB: November 26, 1955 Today's Date: 05/05/2014    History of Present Illness admitted on 05/04/2014 with acute CVA. R sided facial droop and unable to lift up R hand   Clinical Impression   Pt at baseline level of funciton, independent with ADLs and ADL mobility. L UE weakness and facial droop resolved    Follow Up Recommendations  No OT follow up    Equipment Recommendations  None recommended by OT    Recommendations for Other Services       Precautions / Restrictions Precautions Precautions: None Restrictions Weight Bearing Restrictions: No      Mobility Bed Mobility Overal bed mobility: Independent                Transfers Overall transfer level: Independent Equipment used: None                  Balance Overall balance assessment: No apparent balance deficits (not formally assessed)                                          ADL Overall ADL's : Independent;At baseline                                             Vision  wears contact lenses, has glasses. no change from baseline, no visual field deficits   Perception Perception Perception Tested?: No   Praxis Praxis Praxis tested?: Not tested    Pertinent Vitals/Pain Pain Assessment: No/denies pain     Hand Dominance Right   Extremity/Trunk Assessment Upper Extremity Assessment Upper Extremity Assessment: Generalized weakness;Overall [facility] for tasks assessed   Lower Extremity Assessment Lower Extremity Assessment: Defer to PT evaluation   Cervical / Trunk Assessment Cervical / Trunk Assessment: Normal   Communication Communication Communication: No difficulties   Cognition Arousal/Alertness: Awake/alert Behavior During Therapy: WFL for tasks assessed/performed Overall Cognitive Status: Within Functional Limits for tasks assessed                     General  Comments   Pt very pleasant and cooperative                 Home Living Family/patient expects to be discharged to:: Private residence Living Arrangements: Spouse/significant other Available Help at Discharge: Family Type of Home: House Home Access: Stairs to enter     Home Layout: Multi-level     Bathroom Shower/Tub: Occupational psychologist: Handicapped height     Home Equipment: None          Prior Functioning/Environment Level of Independence: Independent             OT Diagnosis: Other (comment) (R hand numbness, tingling)   OT Problem List:     OT Treatment/Interventions:      OT Goals(Current goals can be found in the care plan section) Acute Rehab OT Goals Patient Stated Goal: return home  OT Frequency:     Barriers to D/C:  none                        End of Session    Activity Tolerance: Patient tolerated treatment well Patient left: in bed;with  family/visitor present   Time: 1209-1228 OT Time Calculation (min): 19 min Charges:  OT General Charges $OT Visit: 1 Procedure G-Codes:    [name] 05/05/2014, 12:28 PM

## 2014-05-05 NOTE — Discharge Summary (Signed)
Physician Discharge Summary  Casey Pierce ZLD:[id number] DOB: 1955/11/13 DOA: 05/04/2014  PCP: Irven Shelling, MD  Admit date: 05/04/2014 Discharge date: 05/05/2014  Time spent: 35 minutes  Recommendations for Outpatient Follow-up:  Needs repeat triglyceride level, if still elevated he will need tricor. He will need LFT prior to that. He had mild elevation of LFT.  Please Make sure patient follow up with Cardiology, Dr Lequita Halt.  Patient aware that need to work on diet due to pre diabetes.    Discharge Diagnoses:    Stroke   A fib   Hyperlipidemia   Coronary artery disease involving native coronary artery of native heart with angina pectoris   Discharge Condition: stable.   Diet recommendation: Carb modified  Filed Weights   05/04/14 0125  Weight: 81.647 kg (180 lb)    History of present illness:  History of Present Illness:This is a 59 y.o. year old male with significant past medical history of CAD s/p MI, HLD presenting with CVA. Per report, pt was found by daughter around midnight non verbal, w/ R sided facial droop and unable to lift up R hand. Symptoms persisted on presentation to the ER. A code stroke was called. Pt had head CT that initially showed L MCA thrombus. However, after IV contrast was given for Head CTA, thrombus had appeared to migrate to small M3 branch w/ subsequent complete resolution of sxs.  Pt denies any prior hx/o CVA in the past. Non smoker. Reports compliance w/ medication at home. No reported illicit drug use. Hemodynamically stable on presentation. CBC, CMET, trop grossly WNL.   Hospital Course:  1-Acute stroke. Patient was found to have A fib. Started on eliquis for stroke prevention. ECHO; with Ef 55 %. MRI positive for acute stroke. On lipitor. High triglyceride, patient will work on diet. Need repeat LFT prior starting tricor.  Neurologic deficit almost completed resolved, only has right hand numbness.  HbA 1 at .6.3, patient counseling regarding  diet.  2-A fib started on eliquis. Rate controlled with metoprolol.   Procedures:  ECHO;No cardiac source of emboli was indentified.  Doppler; No evidence of deep vein thrombosis involving the right lower extremity and left lower extremity. - No evidence of Baker&'s cyst on the right or left  Consultations:  neurology  Discharge Exam: Filed Vitals:   05/05/14 1035  BP: 128/90  Pulse: 76  Temp: 97.5 F (36.4 C)  Resp: 16    General: alert in no distress.  Cardiovascular: IRR Respiratory: CTA  Discharge Instructions   Discharge Instructions    Diet - low sodium heart healthy    Complete by:  As directed      Increase activity slowly    Complete by:  As directed           Current Discharge Medication List    START taking these medications   Details  apixaban (ELIQUIS) 5 MG TABS tablet Take 1 tablet (5 mg total) by mouth 2 (two) times daily. Qty: 60 tablet, Refills: 0      CONTINUE these medications which have NOT CHANGED   Details  atorvastatin (LIPITOR) 20 MG tablet Take 10 mg by mouth daily.    metoprolol tartrate (LOPRESSOR) 25 MG tablet Take 25 mg by mouth 2 (two) times daily.    nitroGLYCERIN (NITROSTAT) 0.4 MG SL tablet Place 0.4 mg under the tongue every 5 (five) minutes as needed for chest pain.    Omega-3 Fatty Acids (FISH OIL PO) Take 1 tablet by mouth daily.  STOP taking these medications     aspirin EC 81 MG tablet        No Known Allergies Follow-up Information    Follow up with SETHI,PRAMOD, MD In 4 weeks.   Specialties:  Neurology, Radiology   Contact information:   [address] [place] [address] [phone]       Follow up with Irven Shelling, MD In 1 week.   Specialty:  Internal Medicine   Contact information:   [address] [place]  [address] [phone]        The results of significant diagnostics from this hospitalization (including imaging, microbiology,  ancillary and laboratory) are listed below for reference.    Significant Diagnostic Studies: Ct Angio Head W/cm &/or Wo Cm  05/04/2014   CLINICAL DATA:  Nausea and vomiting beginning 1 hour prior to arrival in the ER, subsequently nonverbal then altered, RIGHT hand weakness.  EXAM: CT ANGIOGRAPHY HEAD AND NECK  TECHNIQUE: Multidetector CT imaging of the head and neck was performed using the standard protocol during bolus administration of intravenous contrast. Multiplanar CT image reconstructions and MIPs were obtained to evaluate the vascular anatomy. Carotid stenosis measurements (when applicable) are obtained utilizing NASCET criteria, using the distal internal carotid diameter as the denominator.  CONTRAST:  6mL OMNIPAQUE IOHEXOL 350 MG/ML SOLN  COMPARISON:  None.  FINDINGS: CT HEAD  The ventricles and sulci are normal. No intraparenchymal hemorrhage, mass effect nor midline shift. No acute large vascular territory infarcts. LEFT insular. Spine, axial 14/32.  No abnormal extra-axial fluid collections. Basal cisterns are patent.  No skull fracture. The included ocular globes and orbital contents are non-suspicious ; Bilateral drusen. Paranasal sinus mucosal thickening without air-fluid levels. Mastoid air cells are well aerated.  CTA NECK  Normal appearance of the thoracic arch, normal branch pattern. The origins of the innominate, left Common carotid artery and subclavian artery are widely patent.  Bilateral Common carotid arteries are widely patent, coursing in a straight line fashion. Eccentric RIGHT greater LEFT intimal thickening of the distal Common carotid arteries, trace calcific atherosclerosis of the LEFT carotid bulb. Normal appearance of the carotid bifurcations without hemodynamically significant stenosis by NASCET criteria. Normal appearance of the included internal carotid arteries. Bilateral tonsillar loops.  RIGHT vertebral artery is dominant. Normal appearance of the vertebral arteries,  which appear widely patent. Mild extrinsic compression due to uncovertebral hypertrophy and facet arthropathy.  No hemodynamically significant stenosis by NASCET criteria. No dissection, no pseudoaneurysm. No abnormal luminal irregularity. No contrast extravasation.  Fatty replaced parotid glands with small intra parotid lymph nodes. No acute osseous process though bone windows have not been submitted.  CTA HEAD  Anterior circulation: Normal appearance of the cervical internal carotid arteries, petrous, cavernous and supra clinoid internal carotid arteries. Mild calcific atherosclerosis of the carotid siphons. Widely patent anterior communicating artery. Normal appearance of the anterior cerebral arteries. Focally occluded branch of left M3 branch.  Posterior circulation: RIGHT vertebral artery is dominant with normal appearance of the vertebral arteries, vertebrobasilar junction and basilar artery, as well as main branch vessels. RIGHT P1 infundibulum. LEFT vertebral artery terminates in the LEFT posterior-inferior cerebellar artery. Robust RIGHT posterior communicating artery present. Normal appearance of the posterior cerebral arteries.  No large vessel occlusion, hemodynamically significant stenosis, dissection, luminal irregularity, contrast extravasation or aneurysm within the anterior nor posterior circulation.  IMPRESSION: CT head: Insular. Sign concerning for LEFT MCA thrombus without large vessel occlusion.  CTA neck: Mild intimal thickening and  calcific atherosclerosis without hemodynamically significant stenosis.  CTA head: Tiny left M3 branch occlusion, this appears to suggest migration of the M3 thrombus during the examination.  Acute findings discussed with and reconfirmed by Dr.Kirkpatrick on 05/04/2014 at 1:53 am.   Electronically Signed   By: Elon Alas   On: 05/04/2014 01:54   Ct Angio Neck W/cm &/or Wo/cm  05/04/2014   CLINICAL DATA:  Nausea and vomiting beginning 1 hour prior to arrival  in the ER, subsequently nonverbal then altered, RIGHT hand weakness.  EXAM: CT ANGIOGRAPHY HEAD AND NECK  TECHNIQUE: Multidetector CT imaging of the head and neck was performed using the standard protocol during bolus administration of intravenous contrast. Multiplanar CT image reconstructions and MIPs were obtained to evaluate the vascular anatomy. Carotid stenosis measurements (when applicable) are obtained utilizing NASCET criteria, using the distal internal carotid diameter as the denominator.  CONTRAST:  87mL OMNIPAQUE IOHEXOL 350 MG/ML SOLN  COMPARISON:  None.  FINDINGS: CT HEAD  The ventricles and sulci are normal. No intraparenchymal hemorrhage, mass effect nor midline shift. No acute large vascular territory infarcts. LEFT insular. Spine, axial 14/32.  No abnormal extra-axial fluid collections. Basal cisterns are patent.  No skull fracture. The included ocular globes and orbital contents are non-suspicious ; Bilateral drusen. Paranasal sinus mucosal thickening without air-fluid levels. Mastoid air cells are well aerated.  CTA NECK  Normal appearance of the thoracic arch, normal branch pattern. The origins of the innominate, left Common carotid artery and subclavian artery are widely patent.  Bilateral Common carotid arteries are widely patent, coursing in a straight line fashion. Eccentric RIGHT greater LEFT intimal thickening of the distal Common carotid arteries, trace calcific atherosclerosis of the LEFT carotid bulb. Normal appearance of the carotid bifurcations without hemodynamically significant stenosis by NASCET criteria. Normal appearance of the included internal carotid arteries. Bilateral tonsillar loops.  RIGHT vertebral artery is dominant. Normal appearance of the vertebral arteries, which appear widely patent. Mild extrinsic compression due to uncovertebral hypertrophy and facet arthropathy.  No hemodynamically significant stenosis by NASCET criteria. No dissection, no pseudoaneurysm. No  abnormal luminal irregularity. No contrast extravasation.  Fatty replaced parotid glands with small intra parotid lymph nodes. No acute osseous process though bone windows have not been submitted.  CTA HEAD  Anterior circulation: Normal appearance of the cervical internal carotid arteries, petrous, cavernous and supra clinoid internal carotid arteries. Mild calcific atherosclerosis of the carotid siphons. Widely patent anterior communicating artery. Normal appearance of the anterior cerebral arteries. Focally occluded branch of left M3 branch.  Posterior circulation: RIGHT vertebral artery is dominant with normal appearance of the vertebral arteries, vertebrobasilar junction and basilar artery, as well as main branch vessels. RIGHT P1 infundibulum. LEFT vertebral artery terminates in the LEFT posterior-inferior cerebellar artery. Robust RIGHT posterior communicating artery present. Normal appearance of the posterior cerebral arteries.  No large vessel occlusion, hemodynamically significant stenosis, dissection, luminal irregularity, contrast extravasation or aneurysm within the anterior nor posterior circulation.  IMPRESSION: CT head: Insular. Sign concerning for LEFT MCA thrombus without large vessel occlusion.  CTA neck: Mild intimal thickening and calcific atherosclerosis without hemodynamically significant stenosis.  CTA head: Tiny left M3 branch occlusion, this appears to suggest migration of the M3 thrombus during the examination.  Acute findings discussed with and reconfirmed by Dr.Kirkpatrick on 05/04/2014 at 1:53 am.   Electronically Signed   By: Elon Alas   On: 05/04/2014 01:54   Mr Brain Wo Contrast  05/04/2014   CLINICAL DATA:  Acute onset  RIGHT facial droop and aphasia at midnight, suspect LEFT MCA thrombosis.  EXAM: MRI HEAD WITHOUT CONTRAST  MRA HEAD WITHOUT CONTRAST  TECHNIQUE: Multiplanar, multiecho pulse sequences of the brain and surrounding structures were obtained without intravenous  contrast. Angiographic images of the head were obtained using MRA technique without contrast.  COMPARISON:  CT of the head and CTA May 04, 2014 at 1:13 a.m.  FINDINGS: MRI HEAD FINDINGS  Subcentimeter focus of reduced diffusion within the LEFT temporal lobe, superior gyrus. Faint linear reduced diffusion within the LEFT parietal lobe, post central gyrus. No susceptibility artifact to suggest hemorrhage. Linear susceptibility artifact in LEFT M3 small branch, corresponding to occlusion on CTA of same day, reported separately.  Ventricles and sulci are normal for patient's age. A few subcentimeter supratentorial white matter T2 hyperintensities are less than expected for age in the nonspecific may reflect chronic small vessel ischemic disease. Nor mass lesions or mass effect.  No abnormal extra-axial fluid collections. Major intracranial vascular flow voids seen at the skull base. FLAIR bright signal within it LEFT M3 segment and distal branches.  Mild to moderate paranasal sinus mucosal thickening with sphenoid sinus air-fluid level. The mastoid air cells are well aerated. Ocular globes and orbital contents are unremarkable. No abnormal sellar expansion. No cerebellar tonsillar ectopia.  MRA HEAD FINDINGS  Anterior circulation: Normal flow related enhancement of the included cervical, petrous, cavernous and supra clinoid internal carotid arteries. Patent anterior communicating artery. Normal flow related enhancement of the anterior and RIGHT middle cerebral arteries, including more distal segments. Robust RIGHT posterior communicating artery. Thready irregular LEFT M3 segment and slightly decreased distal LEFT middle cerebral artery branches.  No large vessel occlusion, high-grade stenosis, abnormal luminal irregularity, aneurysm.  Posterior circulation: RIGHT vertebral artery is dominant. LEFT vertebral artery appears to terminate in the posterior inferior cerebellar artery. Basilar artery is patent, with  normal flow related enhancement of the main branch vessels. RIGHT T1 infundibulum. Normal flow related enhancement of the posterior cerebral arteries.  No large vessel occlusion, high-grade stenosis, abnormal luminal irregularity, aneurysm.  IMPRESSION: MRI HEAD: 2 subcentimeter foci of acute ischemia in LEFT middle cerebral artery territory. Linear susceptibility artifact in LEFT M3 branch consistent with thromboembolic disease.  Otherwise normal MRI of the brain for age.  MRA HEAD: Thready irregular LEFT M3 segment and slightly decreased distal LEFT middle cerebral artery branches. No large vessel occlusion.   Electronically Signed   By: Elon Alas   On: 05/04/2014 04:06   Mr Jodene Nam Head/brain Wo Cm  05/04/2014   CLINICAL DATA:  Acute onset RIGHT facial droop and aphasia at midnight, suspect LEFT MCA thrombosis.  EXAM: MRI HEAD WITHOUT CONTRAST  MRA HEAD WITHOUT CONTRAST  TECHNIQUE: Multiplanar, multiecho pulse sequences of the brain and surrounding structures were obtained without intravenous contrast. Angiographic images of the head were obtained using MRA technique without contrast.  COMPARISON:  CT of the head and CTA May 04, 2014 at 1:13 a.m.  FINDINGS: MRI HEAD FINDINGS  Subcentimeter focus of reduced diffusion within the LEFT temporal lobe, superior gyrus. Faint linear reduced diffusion within the LEFT parietal lobe, post central gyrus. No susceptibility artifact to suggest hemorrhage. Linear susceptibility artifact in LEFT M3 small branch, corresponding to occlusion on CTA of same day, reported separately.  Ventricles and sulci are normal for patient's age. A few subcentimeter supratentorial white matter T2 hyperintensities are less than expected for age in the nonspecific may reflect chronic small vessel ischemic disease. Nor mass lesions or mass effect.  No abnormal extra-axial fluid collections. Major intracranial vascular flow voids seen at the skull base. FLAIR bright signal within it LEFT  M3 segment and distal branches.  Mild to moderate paranasal sinus mucosal thickening with sphenoid sinus air-fluid level. The mastoid air cells are well aerated. Ocular globes and orbital contents are unremarkable. No abnormal sellar expansion. No cerebellar tonsillar ectopia.  MRA HEAD FINDINGS  Anterior circulation: Normal flow related enhancement of the included cervical, petrous, cavernous and supra clinoid internal carotid arteries. Patent anterior communicating artery. Normal flow related enhancement of the anterior and RIGHT middle cerebral arteries, including more distal segments. Robust RIGHT posterior communicating artery. Thready irregular LEFT M3 segment and slightly decreased distal LEFT middle cerebral artery branches.  No large vessel occlusion, high-grade stenosis, abnormal luminal irregularity, aneurysm.  Posterior circulation: RIGHT vertebral artery is dominant. LEFT vertebral artery appears to terminate in the posterior inferior cerebellar artery. Basilar artery is patent, with normal flow related enhancement of the main branch vessels. RIGHT T1 infundibulum. Normal flow related enhancement of the posterior cerebral arteries.  No large vessel occlusion, high-grade stenosis, abnormal luminal irregularity, aneurysm.  IMPRESSION: MRI HEAD: 2 subcentimeter foci of acute ischemia in LEFT middle cerebral artery territory. Linear susceptibility artifact in LEFT M3 branch consistent with thromboembolic disease.  Otherwise normal MRI of the brain for age.  MRA HEAD: Thready irregular LEFT M3 segment and slightly decreased distal LEFT middle cerebral artery branches. No large vessel occlusion.   Electronically Signed   By: Elon Alas   On: 05/04/2014 04:06    Microbiology: No results found for this or any previous visit (from the past 240 hour(s)).   Labs: Basic Metabolic Panel:  Recent Labs Lab 05/04/14 0110 05/04/14 0112 05/05/14 0621  NA 138 142 136  K 3.9 3.6 3.8  CL 99 99 105   CO2 31  --  28  GLUCOSE 129* 129* 112*  BUN 12 13 10   CREATININE 0.85 0.80 0.81  CALCIUM 8.9  --  8.9   Liver Function Tests:  Recent Labs Lab 05/04/14 0110  AST 44*  ALT 55*  ALKPHOS 41  BILITOT 0.9  PROT 6.4  ALBUMIN 3.8   No results for input(s): LIPASE, AMYLASE in the last 168 hours. No results for input(s): AMMONIA in the last 168 hours. CBC:  Recent Labs Lab 05/04/14 0110 05/04/14 0112 05/05/14 0621  WBC 9.6  --  9.0  NEUTROABS 4.3  --   --   HGB 15.4 15.0 15.3  HCT 48.5 44.0 44.3  MCV 96.2  --  87.2  PLT 237  --  230   Cardiac Enzymes:  Recent Labs Lab 05/04/14 0110  TROPONINI <0.03   BNP: BNP (last 3 results) No results for input(s): BNP in the last 8760 hours.  ProBNP (last 3 results) No results for input(s): PROBNP in the last 8760 hours.  CBG: No results for input(s): GLUCAP in the last 168 hours.     Signed:  Niel Hummer A  [facility] 05/05/2014, 2:37 PM

## 2014-05-05 NOTE — Progress Notes (Signed)
UR complete.  Othniel Maret RN, MSN 

## 2014-05-05 NOTE — Progress Notes (Signed)
Inpatient Diabetes Program Recommendations  AACE/ADA: New Consensus Statement on Inpatient Glycemic Control (2013)  Target Ranges:  Prepandial:   less than 140 mg/dL      Peak postprandial:   less than 180 mg/dL (1-2 hours)      Critically ill patients:  140 - 180 mg/dL   Inpatient Diabetes Program Recommendations HgbA1C: 6.3 prediabetes range   Note: Order for "education, diet" placed.  This consult is more appropriate for a RD.  RD consult placed.   Diabetes Coordinator will also meet with patient to answer questions/concerns.   Thank you  Raoul Pitch BSN, RN,CDE Inpatient Diabetes Coordinator [phone] (team pager)

## 2014-05-05 NOTE — Evaluation (Signed)
Physical Therapy Evaluation Patient Details Name: Casey Pierce MRN: [id number] DOB: [birth year] Today's Date: 05/05/2014   History of Present Illness  admitted on 05/04/2014 with acute CVA. R sided facial droop and unable to lift up R hand  Clinical Impression  Patient presents close to functional baseline and able to tolerate ambulating community distances while performing higher level balance challenges and negotiate steps w/out difficulty. Only reported symptoms are right hand numbness/tingling. Pt does not require further skilled therapy services. Discharge from therapy.    Follow Up Recommendations No PT follow up    Equipment Recommendations  None recommended by PT    Recommendations for Other Services       Precautions / Restrictions Precautions Precautions: None Restrictions Weight Bearing Restrictions: No      Mobility  Bed Mobility Overal bed mobility: Independent                Transfers Overall transfer level: Independent Equipment used: None                Ambulation/Gait Ambulation/Gait assistance: Independent Ambulation Distance (Feet): 500 Feet Assistive device: None Gait Pattern/deviations: WFL(Within Functional Limits)   Gait velocity interpretation: at or above normal speed for age/gender General Gait Details: Pt with steady gait. Tolerated higher level balance activities without LOB or difficulty.   Stairs Stairs: Yes Stairs assistance: Modified independent (Device/Increase time) Stair Management: One rail Left;Alternating pattern Number of Stairs: 3    Wheelchair Mobility    Modified Rankin (Stroke Patients Only) Modified Rankin (Stroke Patients Only) Pre-Morbid Rankin Score: No symptoms Modified Rankin: No significant disability     Balance Overall balance assessment: Needs assistance   Sitting balance-Leahy Scale: Normal       Standing balance-Leahy Scale: Normal               High level balance activites:  Side stepping;Backward walking;Direction changes;Turns;Sudden stops;Head turns High Level Balance Comments: Able to step over objects and change gait speeds without deviations in gait or LOB. Performed above activities without difficulty.              Pertinent Vitals/Pain Pain Assessment: No/denies pain    Home Living Family/patient expects to be discharged to:: Private residence Living Arrangements: Spouse/significant other Available Help at Discharge: Family Type of Home: House Home Access: Stairs to enter Entrance Stairs-Rails: Right Entrance Stairs-Number of Steps: 3 Home Layout: Multi-level Home Equipment: None      Prior Function Level of Independence: Independent               Hand Dominance   Dominant Hand: Right    Extremity/Trunk Assessment   Upper Extremity Assessment: Defer to OT evaluation (right hand numbness/tingling)           Lower Extremity Assessment: Overall WFL for tasks assessed      Cervical / Trunk Assessment: Normal  Communication   Communication: No difficulties  Cognition Arousal/Alertness: Awake/alert Behavior During Therapy: WFL for tasks assessed/performed Overall Cognitive Status: Within Functional Limits for tasks assessed                      General Comments      Exercises        Assessment/Plan    PT Assessment Patent does not need any further PT services  PT Diagnosis     PT Problem List    PT Treatment Interventions     PT Goals (Current goals can be found in the Care Plan section) Acute  Rehab PT Goals Patient Stated Goal: return home PT Goal Formulation: All assessment and education complete, DC therapy Time For Goal Achievement: 05/19/14 Potential to Achieve Goals: Good    Frequency     Barriers to discharge        Co-evaluation               End of Session Equipment Utilized During Treatment: Gait belt Activity Tolerance: Patient tolerated treatment well Patient left: in  bed;with call bell/phone within reach;with family/visitor present Nurse Communication: Mobility status         Time: 6060-0459 PT Time Calculation (min) (ACUTE ONLY): 10 min   Charges:   PT Evaluation $Initial PT Evaluation Tier I: 1 Procedure     PT G CodesCandy Sledge A 2014-05-19, 3:01 PM  Candy Sledge, [place], DPT [phone]

## 2014-05-28 ENCOUNTER — Encounter: Payer: Self-pay | Admitting: Internal Medicine

## 2014-05-28 ENCOUNTER — Ambulatory Visit (INDEPENDENT_AMBULATORY_CARE_PROVIDER_SITE_OTHER): Payer: BC Managed Care – PPO | Admitting: Internal Medicine

## 2014-05-28 VITALS — BP 90/60 | HR 60 | Ht 65.0 in | Wt 177.2 lb

## 2014-05-28 DIAGNOSIS — I480 Paroxysmal atrial fibrillation: Secondary | ICD-10-CM

## 2014-05-28 DIAGNOSIS — I639 Cerebral infarction, unspecified: Secondary | ICD-10-CM

## 2014-05-28 MED ORDER — APIXABAN 5 MG PO TABS: 5.0000 mg | ORAL_TABLET | Freq: Two times a day (BID) | ORAL | Status: DC

## 2014-05-28 NOTE — Progress Notes (Signed)
Electrophysiology Office Note   Date:  05/28/2014   ID:  Casey Pierce, DOB 09/14/1955, MRN [id number]  PCP:  Irven Shelling, MD  Cardiologist:  Primary Electrophysiologist:    Virl Axe, MD    Chief Complaint  Patient presents with  . Appointment    afib f/u     History of Present Illness: Casey Pierce is a 59 y.o. male    seen in follow-up after he presented for aborted 2016 with an acute stroke telemetry demonstrated atrial fibrillation. He was started on apixaban. He also has a history of dyslipidemia. If history of coronary artery disease  Echocardiogram demonstrated normal left ventricular function; left atrial size was also normal  Today, he denies symptoms of  chest pain, shortness of breath, orthopnea, PND, lower extremity edema, bleeding, or neurologic sequela.  he has no complaints of palpitations, lightheadedness presyncope or syncope  The patient is tolerating medications without difficulties and is otherwise without complaint today.    Past Medical History  Diagnosis Date  . Coronary artery disease   . Myocardial infarction   . Stroke 04/2014   Past Surgical History  Procedure Laterality Date  . Joint replacement    . Coronary stent placement  2001     Current Outpatient Prescriptions  Medication Sig Dispense Refill  . apixaban (ELIQUIS) 5 MG TABS tablet Take 1 tablet (5 mg total) by mouth 2 (two) times daily. 60 tablet 0  . atorvastatin (LIPITOR) 20 MG tablet Take 10 mg by mouth daily.    . metoprolol tartrate (LOPRESSOR) 25 MG tablet Take 25 mg by mouth 2 (two) times daily.    . nitroGLYCERIN (NITROSTAT) 0.4 MG SL tablet Place 0.4 mg under the tongue every 5 (five) minutes as needed for chest pain.    . Omega-3 Fatty Acids (FISH OIL PO) Take 1 tablet by mouth daily.     No current facility-administered medications for this visit.    Allergies:   Review of patient's allergies indicates no known allergies.   Social History:  The patient   reports that he has never smoked. He has quit using smokeless tobacco. His smokeless tobacco use included Chew. He reports that he does not drink alcohol or use illicit drugs.   Family History:  The patient's family history includes Cancer in his father; Heart attack in his maternal uncle.    ROS:  Please see the history of present illness.   Otherwise, review of systems is negative .    PHYSICA morningL EXAM: VS:  BP 90/60 mmHg  Pulse 60  Ht 5\' 5"  (1.651 m)  Wt 177 lb 3.2 oz (80.377 kg)  BMI 29.49 kg/m2 , BMI Body mass index is 29.49 kg/(m^2). GEN: Well nourished, well developed, in no acute distress HEENT: normal Neck: JVD flat, carotid bruits, or masses Cardiac: IRR RR; no murmur  rubs,  noS4  Respiratory:  clear to auscultation bilaterally, normal work of breathing Back without kyphosis or CVAT GI: soft, nontender, nondistended, + BS MS: no deformity or atrophy Skin: warm and dry,   Extremities No Clubbing cyanosis none Edema Neuro:  Strength and sensation are intact Psych: euthymic mood, full affect  EKG:  EKG is ordered today. The ekg ordered today satrial fibrillation    Recent Labs: 05/04/2014: ALT 55* 05/05/2014: BUN 10; Creatinine 0.81; Hemoglobin 15.3; Platelets 230; Potassium 3.8; Sodium 136    Lipid Panel     Component Value Date/Time   CHOL 168 05/04/2014 0110   TRIG 362* 05/04/2014 0110  HDL 30* 05/04/2014 0110   CHOLHDL 5.6 05/04/2014 0110   VLDL 72* 05/04/2014 0110   LDLCALC 66 05/04/2014 0110     Wt Readings from Last 3 Encounters:  05/28/14 177 lb 3.2 oz (80.377 kg)      Other studies Reviewed: Additional studies/ records that were reviewed today include: hospital recordsReview of the above records today demonstrates: As above   ASSESSMENT AND PLAN:   Atrial fibrillation   stroke with right hemiparesis and aphasia-marked resolution   Ischemic heart disease with prior infarction and prior stenting  Sleep disordered breathing  She is  asymptomatic with his low blood pressure. He has no symptoms of associated with his atrial fibrillation. At this point we will continue him on anticoagulation with apixaban. I will need to be in touch with Dr. Tamala Julian as to what he would like to do now 15 years following his first generation stent.  With atrial fibrillation, a time information and request and sleep disordered breathing I think he needs a sleep study. I will forward  t request of Dr. Laurann Montana  Current medicines are reviewed at length with the patient today.   The patient does not have concerns regarding his medicines.  The following changes were made today:  none  Labs/ tests ordered today include:    No orders of the defined types were placed in this encounter.     Disposition:   FU with me6 6 month(s)   Signed, Virl Axe, MD  05/28/2014 11:29 AM     [address] [place] [address] [phone] (office) [phone] (fax)

## 2014-05-28 NOTE — Patient Instructions (Signed)
Your physician recommends that you continue on your current medications as directed. Please refer to the Current Medication list given to you today.  Your physician wants you to follow-up in: 6 months with Dr. Klein. You will receive a reminder letter in the mail two months in advance. If you don't receive a letter, please call our office to schedule the follow-up appointment.  

## 2014-06-07 ENCOUNTER — Encounter: Payer: Self-pay | Admitting: Neurology

## 2014-06-07 ENCOUNTER — Ambulatory Visit (INDEPENDENT_AMBULATORY_CARE_PROVIDER_SITE_OTHER): Payer: BC Managed Care – PPO | Admitting: Neurology

## 2014-06-07 ENCOUNTER — Ambulatory Visit: Payer: Self-pay | Admitting: Neurology

## 2014-06-07 VITALS — BP 107/65 | HR 60 | Ht 65.0 in | Wt 178.0 lb

## 2014-06-07 DIAGNOSIS — R29818 Other symptoms and signs involving the nervous system: Secondary | ICD-10-CM

## 2014-06-07 DIAGNOSIS — Z789 Other specified health status: Secondary | ICD-10-CM

## 2014-06-07 DIAGNOSIS — R200 Anesthesia of skin: Secondary | ICD-10-CM | POA: Diagnosis not present

## 2014-06-07 DIAGNOSIS — I63132 Cerebral infarction due to embolism of left carotid artery: Secondary | ICD-10-CM | POA: Diagnosis not present

## 2014-06-07 DIAGNOSIS — R29898 Other symptoms and signs involving the musculoskeletal system: Secondary | ICD-10-CM

## 2014-06-07 NOTE — Progress Notes (Signed)
[id number]    Provider:  Dr Jaynee Eagles Referring Provider: Lavone Orn, MD Primary Care Physician:  Irven Shelling, MD  CC:  Stroke  HPI:  Casey Pierce is a 59 y.o. male here as a referral from Dr. Laurann Montana for Stroke. On 2/16 at midnight he found himself unable to speak. In the ED he was aphasic with right facial droop. He was taken for CT scan which demonstrated a thrombus or embolus in the insular branch of the left MCA, however on CT angiogram the thrombus or embolus moved significantly distally. He was not given TPA due to rapidly improving symptoms. This corresponded exactly in time to the patient's clinical improvement with rapid resolution of all symptoms except for mild dysarthria and right hand numbness. He was found to have afib which explained the embolic stroke, started on Eliquis  He is feeling well, thinks he can go back to work. The numbness in his right hand persists, dysarthria is improving. No side effects from the Eliquis. Doing well.   Reviewed notes, labs and imaging from outside physicians, which showed:  IMPRESSION: MRI HEAD: 2 subcentimeter foci of acute ischemia in LEFT middle cerebral artery territory. Linear susceptibility artifact in LEFT M3 branch consistent with thromboembolic disease.  Otherwise normal MRI of the brain for age.  MRA HEAD: Thready irregular LEFT M3 segment and slightly decreased distal LEFT middle cerebral artery branches. No large vessel Occlusion.  Personally reviewed images, agree with findings  HgbA1c 6.3 LDL 66  Review of Systems: Patient complains of symptoms per HPI as well as the following symptoms:headache, snoring. Pertinent negatives per HPI. All others negative.   History   Social History  . Marital Status: Married    Spouse Name: N/A  . Number of Children: N/A  . Years of Education: N/A   Occupational History  . Not on file.   Social History Main Topics  . Smoking status: Never Smoker     . Smokeless tobacco: Former Systems developer    Types: Chew     Comment: quit chewing 7 years ago  . Alcohol Use: No  . Drug Use: No  . Sexual Activity: Not on file   Other Topics Concern  . Not on file   Social History Narrative    Family History  Problem Relation Age of Onset  . Cancer Father   . Heart attack Maternal Uncle     Past Medical History  Diagnosis Date  . Coronary artery disease   . Myocardial infarction   . Stroke 04/2014    Past Surgical History  Procedure Laterality Date  . Joint replacement    . Coronary stent placement  2001    Current Outpatient Prescriptions  Medication Sig Dispense Refill  . apixaban (ELIQUIS) 5 MG TABS tablet Take 1 tablet (5 mg total) by mouth 2 (two) times daily. 60 tablet 6  . atorvastatin (LIPITOR) 20 MG tablet Take 10 mg by mouth daily.    . metoprolol tartrate (LOPRESSOR) 25 MG tablet Take 25 mg by mouth 2 (two) times daily.    . nitroGLYCERIN (NITROSTAT) 0.4 MG SL tablet Place 0.4 mg under the tongue every 5 (five) minutes as needed for chest pain.    . Omega-3 Fatty Acids (FISH OIL PO) Take 1 tablet by mouth daily.    . ALPHAGAN P 0.1 % SOLN Place 1 drop into both eyes 2 times daily at 12 noon and 4 pm.  3   No current facility-administered medications for this visit.  Allergies as of 06/07/2014  . (No Known Allergies)    Vitals: BP 107/65 mmHg  Pulse 60  Ht 5\' 5"  (1.651 m)  Wt 178 lb (80.74 kg)  BMI 29.62 kg/m2 Last Weight:  Wt Readings from Last 1 Encounters:  06/07/14 178 lb (80.74 kg)   Last Height:   Ht Readings from Last 1 Encounters:  06/07/14 5\' 5"  (1.651 m)   Physical exam: Exam: Gen: NAD, conversant, well nourised, obese, well groomed                     CV: RRR, no MRG. No Carotid Bruits. No peripheral edema, warm, nontender Eyes: Conjunctivae clear without exudates or hemorrhage  Neuro: Detailed Neurologic Exam  Speech:    Speech is normal; fluent and spontaneous with normal comprehension.   Cognition:    The patient is oriented to person, place, and time;     recent and remote memory intact;     language fluent;     normal attention, concentration,     fund of knowledge Cranial Nerves:    The pupils are equal, round, and reactive to light. The fundi are normal and spontaneous venous pulsations are present. Visual fields are full to finger confrontation. Extraocular movements are intact. Trigeminal sensation is intact and the muscles of mastication are normal. The face is symmetric. The palate elevates in the midline. Hearing intact. Voice is normal. Shoulder shrug is normal. The tongue has normal motion without fasciculations.   Coordination:    Normal finger to nose and heel to shin. Normal rapid alternating movements.   Gait:    Heel-toe and tandem gait are normal.   Motor Observation:    No asymmetry, no atrophy, and no involuntary movements noted. Tone:    Normal muscle tone.    Posture:    Posture is normal. normal erect    Strength:    Strength is V/V in the upper and lower limbs.      Sensation: decreased right hand with some clumsiness     Reflex Exam:  DTR's:    Deep tendon reflexes in the upper and lower extremities are normal bilaterally.   Toes:    The toes are downgoing bilaterally.   Clonus:    Clonus is absent.       Assessment/Plan:  59 year old male with left MCA embolic stroke from new-onset afib with resultant right hand deficits.  - can go back to work without restrictions - continue lipitor, eliquis, metoprolol, fish oil. Follow closely woth pcp to control vascular risk factors - f/u with cardiology and pcp  Sarina Ill, MD  [facility] [address] [place], [place] [address]  Phone [phone] Fax [phone]

## 2014-06-07 NOTE — Patient Instructions (Signed)
Overall you are doing fairly well but I do want to suggest a few things today:   Remember to drink plenty of fluid, eat healthy meals and do not skip any meals. Try to eat protein with a every meal and eat a healthy snack such as fruit or nuts in between meals. Try to keep a regular sleep-wake schedule and try to exercise daily, particularly in the form of walking, 20-30 minutes a day, if you can.   As far as your medications are concerned, I would like to suggest: continue current medications  I would like to see you back as needed, sooner if we need to. Please call us with any interim questions, concerns, problems, updates or refill requests.   Please also call us for any test results so we can go over those with you on the phone.  My clinical assistant and will answer any of your questions and relay your messages to me and also relay most of my messages to you.   Our phone number is [phone]. We also have an after hours call service for urgent matters and there is a physician on-call for urgent questions. For any emergencies you know to call 911 or go to the nearest emergency room

## 2014-12-02 ENCOUNTER — Other Ambulatory Visit: Payer: Self-pay | Admitting: Gastroenterology

## 2014-12-18 ENCOUNTER — Other Ambulatory Visit: Payer: Self-pay | Admitting: Internal Medicine

## 2015-01-16 ENCOUNTER — Other Ambulatory Visit: Payer: Self-pay | Admitting: Internal Medicine

## 2015-02-02 ENCOUNTER — Encounter (HOSPITAL_COMMUNITY): Payer: Self-pay | Admitting: *Deleted

## 2015-02-06 NOTE — Anesthesia Preprocedure Evaluation (Addendum)
Anesthesia Evaluation  Patient identified by MRN, date of birth, ID band Patient awake    Reviewed: Allergy & Precautions, H&P , NPO status , Patient's Chart, lab work & pertinent test results, reviewed documented beta blocker date and time   Airway Mallampati: II  TM Distance: >3 FB Neck ROM: Full    Dental no notable dental hx. (+) Teeth Intact, Dental Advisory Given   Pulmonary neg pulmonary ROS,    Pulmonary exam normal breath sounds clear to auscultation       Cardiovascular hypertension, Pt. on medications and Pt. on home beta blockers + CAD, + Past MI and + Cardiac Stents  Atrial Fibrillation  Rhythm:Irregular Rate:Normal     Neuro/Psych CVA, Residual Symptoms negative psych ROS   GI/Hepatic negative GI ROS, Neg liver ROS,   Endo/Other  negative endocrine ROS  Renal/GU negative Renal ROS  negative genitourinary   Musculoskeletal   Abdominal   Peds  Hematology negative hematology ROS (+)   Anesthesia Other Findings   Reproductive/Obstetrics negative OB ROS                            Anesthesia Physical Anesthesia Plan  ASA: III  Anesthesia Plan: MAC   Post-op Pain Management:    Induction: Intravenous  Airway Management Planned: Simple Face Mask  Additional Equipment:   Intra-op Plan:   Post-operative Plan:   Informed Consent: I have reviewed the patients History and Physical, chart, labs and discussed the procedure including the risks, benefits and alternatives for the proposed anesthesia with the patient or authorized representative who has indicated his/her understanding and acceptance.   Dental advisory given  Plan Discussed with: CRNA  Anesthesia Plan Comments:         Anesthesia Quick Evaluation

## 2015-02-07 ENCOUNTER — Ambulatory Visit (HOSPITAL_COMMUNITY): Payer: BC Managed Care – PPO | Admitting: Anesthesiology

## 2015-02-07 ENCOUNTER — Ambulatory Visit (HOSPITAL_COMMUNITY)
Admission: RE | Admit: 2015-02-07 | Discharge: 2015-02-07 | Disposition: A | Discharge status: Home / Self Care | Payer: BC Managed Care – PPO | Source: Ambulatory Visit | Attending: Gastroenterology | Admitting: Gastroenterology

## 2015-02-07 ENCOUNTER — Encounter (HOSPITAL_COMMUNITY)
Admission: RE | Disposition: A | Discharge status: Home / Self Care | Payer: Self-pay | Source: Ambulatory Visit | Attending: Gastroenterology

## 2015-02-07 DIAGNOSIS — I252 Old myocardial infarction: Secondary | ICD-10-CM | POA: Insufficient documentation

## 2015-02-07 DIAGNOSIS — I482 Chronic atrial fibrillation: Secondary | ICD-10-CM | POA: Diagnosis not present

## 2015-02-07 DIAGNOSIS — Z955 Presence of coronary angioplasty implant and graft: Secondary | ICD-10-CM | POA: Diagnosis not present

## 2015-02-07 DIAGNOSIS — I2510 Atherosclerotic heart disease of native coronary artery without angina pectoris: Secondary | ICD-10-CM | POA: Insufficient documentation

## 2015-02-07 DIAGNOSIS — Z7901 Long term (current) use of anticoagulants: Secondary | ICD-10-CM | POA: Diagnosis not present

## 2015-02-07 DIAGNOSIS — Z1211 Encounter for screening for malignant neoplasm of colon: Secondary | ICD-10-CM | POA: Diagnosis present

## 2015-02-07 HISTORY — PX: COLONOSCOPY WITH PROPOFOL: SHX5780

## 2015-02-07 SURGERY — COLONOSCOPY WITH PROPOFOL
Anesthesia: Monitor Anesthesia Care

## 2015-02-07 MED ORDER — PROPOFOL 10 MG/ML IV BOLUS
INTRAVENOUS | Status: AC
  Filled 2015-02-07: qty 20

## 2015-02-07 MED ORDER — LACTATED RINGERS IV SOLN
INTRAVENOUS | Status: DC
  Administered 2015-02-07: 1000 mL via INTRAVENOUS

## 2015-02-07 MED ORDER — SODIUM CHLORIDE 0.9 % IV SOLN: INTRAVENOUS | Status: DC

## 2015-02-07 MED ORDER — PROPOFOL 10 MG/ML IV BOLUS
INTRAVENOUS | Status: DC | PRN
  Administered 2015-02-07: 30 mg via INTRAVENOUS
  Administered 2015-02-07: 20 mg via INTRAVENOUS
  Administered 2015-02-07: 100 mg via INTRAVENOUS
  Administered 2015-02-07: 30 mg via INTRAVENOUS
  Administered 2015-02-07 (×2): 20 mg via INTRAVENOUS

## 2015-02-07 SURGICAL SUPPLY — 22 items

## 2015-02-07 NOTE — Anesthesia Postprocedure Evaluation (Signed)
Anesthesia Post Note  Patient: Casey Pierce  Procedure(s) Performed: Procedure(s) (LRB): COLONOSCOPY WITH PROPOFOL (N/A)  Patient location during evaluation: PACU Anesthesia Type: MAC Level of consciousness: awake and alert Pain management: pain level controlled Vital Signs Assessment: post-procedure vital signs reviewed and stable Respiratory status: spontaneous breathing Cardiovascular status: blood pressure returned to baseline Postop Assessment: No signs of nausea or vomiting Anesthetic complications: no    Last Vitals:  Filed Vitals:   02/07/15 0840 02/07/15 0848  BP: 113/74 125/87  Pulse: 56 57  Temp:    Resp: 15 17    Last Pain: There were no vitals filed for this visit.               Jamol Ginyard,W. EDMOND

## 2015-02-07 NOTE — Op Note (Signed)
Procedure: Screening colonoscopy. Normal screening colonoscopy performed on 02/26/2005  Endoscopist: Earle Gell  Premedication: Propofol administered by anesthesia  Procedure: The patient was placed in the left lateral decubitus position. Anal inspection and digital rectal exam were normal. The Pentax pediatric colonoscope was introduced into the rectum and advanced to the cecum. A normal-appearing appendiceal orifice was identified. A normal-appearing ileocecal valve was intubated and the terminal ileum inspected. Colonic preparation for the exam today was satisfactory. Withdrawal time was 14 minutes  Rectum. Normal. Retroflexed view of the distal rectum was normal  Sigmoid colon and descending colon. Normal graft splenic flexure. Normal  Transverse colon. Normal  Hepatic flexure. Normal  Ascending colon. Normal  Cecum and ileocecal valve. Normal  Assessment: Normal screening colonoscopy  Recommendation: Schedule repeat screening colonoscopy in 10 years

## 2015-02-07 NOTE — Discharge Instructions (Signed)

## 2015-02-07 NOTE — H&P (Signed)
  Procedure: Screening colonoscopy. Normal screening colonoscopy performed on 02/26/2005. Chronic anticoagulation due to atrial fibrillation  History: The patient is a 59 year old male born May 16, 1955. He is scheduled to undergo a repeat screening colonoscopy. He stopped taking Eliquis 4 days ago.  Past medical history: Right knee surgery. Coronary artery disease complicated by myocardial infarction. Coronary artery stent placement. Paroxysmal atrial fibrillation.  Exam: The patient is alert and lying comfortably on the endoscopy stretcher. Cardiac exam reveals an irregular rhythm consistent with atrial fibrillation. Abdomen is soft and nontender to palpation. Lungs are clear to auscultation.  Plan: Proceed with screening colonoscopy

## 2015-02-07 NOTE — Transfer of Care (Signed)
Immediate Anesthesia Transfer of Care Note  Patient: Casey Pierce  Procedure(s) Performed: Procedure(s): COLONOSCOPY WITH PROPOFOL (N/A)  Patient Location: PACU  Anesthesia Type:MAC  Level of Consciousness: awake, alert  and oriented  Airway & Oxygen Therapy: Patient Spontanous Breathing and Patient connected to face mask oxygen  Post-op Assessment: Report given to RN and Post -op Vital signs reviewed and stable  Post vital signs: Reviewed and stable  Last Vitals:  Filed Vitals:   02/07/15 0654  BP: 106/60  Pulse: 55  Temp: 36.7 C  Resp: 22    Complications: No apparent anesthesia complications

## 2015-02-08 ENCOUNTER — Encounter (HOSPITAL_COMMUNITY): Payer: Self-pay | Admitting: Gastroenterology

## 2015-03-04 ENCOUNTER — Other Ambulatory Visit: Payer: Self-pay | Admitting: Internal Medicine

## 2015-03-08 ENCOUNTER — Other Ambulatory Visit: Payer: Self-pay | Admitting: Internal Medicine

## 2015-04-03 ENCOUNTER — Other Ambulatory Visit: Payer: Self-pay | Admitting: Internal Medicine

## 2015-04-04 ENCOUNTER — Other Ambulatory Visit: Payer: Self-pay | Admitting: Internal Medicine

## 2015-04-06 ENCOUNTER — Other Ambulatory Visit: Payer: Self-pay | Admitting: Internal Medicine

## 2015-05-03 ENCOUNTER — Other Ambulatory Visit: Payer: Self-pay | Admitting: Internal Medicine

## 2015-05-03 NOTE — Telephone Encounter (Deleted)
Error

## 2015-06-01 ENCOUNTER — Other Ambulatory Visit: Payer: Self-pay | Admitting: Internal Medicine

## 2015-06-06 ENCOUNTER — Ambulatory Visit (INDEPENDENT_AMBULATORY_CARE_PROVIDER_SITE_OTHER): Payer: BC Managed Care – PPO | Admitting: Internal Medicine

## 2015-06-06 ENCOUNTER — Encounter: Payer: Self-pay | Admitting: *Deleted

## 2015-06-06 ENCOUNTER — Encounter: Payer: Self-pay | Admitting: Internal Medicine

## 2015-06-06 VITALS — BP 110/72 | HR 74 | Ht 65.0 in | Wt 177.6 lb

## 2015-06-06 DIAGNOSIS — I639 Cerebral infarction, unspecified: Secondary | ICD-10-CM

## 2015-06-06 DIAGNOSIS — I480 Paroxysmal atrial fibrillation: Secondary | ICD-10-CM

## 2015-06-06 LAB — CBC WITH DIFFERENTIAL/PLATELET
Basophils Absolute: 0 10*3/uL (ref 0.0–0.1)
Basophils Relative: 0 % (ref 0–1)
Eosinophils Absolute: 0.3 10*3/uL (ref 0.0–0.7)
Eosinophils Relative: 4 % (ref 0–5)
HEMATOCRIT: 42.8 % (ref 39.0–52.0)
HEMOGLOBIN: 14.7 g/dL (ref 13.0–17.0)
LYMPHS ABS: 1.6 10*3/uL (ref 0.7–4.0)
Lymphocytes Relative: 19 % (ref 12–46)
MCH: 30.1 pg (ref 26.0–34.0)
MCHC: 34.3 g/dL (ref 30.0–36.0)
MCV: 87.5 fL (ref 78.0–100.0)
MONO ABS: 0.6 10*3/uL (ref 0.1–1.0)
MONOS PCT: 7 % (ref 3–12)
MPV: 11.2 fL (ref 8.6–12.4)
NEUTROS ABS: 5.7 10*3/uL (ref 1.7–7.7)
NEUTROS PCT: 70 % (ref 43–77)
Platelets: 275 10*3/uL (ref 150–400)
RBC: 4.89 MIL/uL (ref 4.22–5.81)
RDW: 13.2 % (ref 11.5–15.5)
WBC: 8.2 10*3/uL (ref 4.0–10.5)

## 2015-06-06 LAB — BASIC METABOLIC PANEL
BUN: 14 mg/dL (ref 7–25)
CALCIUM: 9.7 mg/dL (ref 8.6–10.3)
CHLORIDE: 103 mmol/L (ref 98–110)
CO2: 25 mmol/L (ref 20–31)
Creat: 0.72 mg/dL (ref 0.70–1.25)
GLUCOSE: 117 mg/dL — AB (ref 65–99)
Potassium: 4 mmol/L (ref 3.5–5.3)
SODIUM: 138 mmol/L (ref 135–146)

## 2015-06-06 NOTE — Progress Notes (Signed)
Electrophysiology Office Note   Date:  06/06/2015   ID:  Casey Pierce, DOB 1955-09-17, MRN [id number]  PCP:  Irven Shelling, MD  Cardiologist:  Primary Electrophysiologist:    Casey Axe, MD    No chief complaint on file.   Wonder History of Present Illness: Casey Pierce is a 60 y.o. male    seen in follow-up a  with an acute stroke and telemetry demonstrated atrial fibrillation. He was started on apixaban. He also has a history of dyslipidemia;   He has a history of coronary artery disease with prior stenting by Dr. Tamala Julian about 15 years ago. He had residual nonobstructive disease.  He is on low-dose statin therapy  Echocardiogram demonstrated normal left ventricular function; left atrial size was also normal  Today, he denies symptoms of  chest pain, shortness of breath, orthopnea, PND, lower extremity edema, bleeding, or neurologic sequela.  he has no complaints of palpitations, lightheadedness presyncope or syncope  The patient is tolerating medications without difficulties and is otherwise without complaint today.    Past Medical History  Diagnosis Date  . Coronary artery disease   . Myocardial infarction ([place])   . Stroke [facility]) 04/2014   Past Surgical History  Procedure Laterality Date  . Coronary stent placement  2001  . Knee surgery  1972  . Colonoscopy with propofol N/A 02/07/2015    Procedure: COLONOSCOPY WITH PROPOFOL;  Surgeon: [name], MD;  Location: [facility] ENDOSCOPY;  Service: Endoscopy;  Laterality: N/A;     Current Outpatient Prescriptions  Medication Sig Dispense Refill  . ALPHAGAN P 0.1 % SOLN Place 1 drop into both eyes 2 (two) times daily.   3  . atorvastatin (LIPITOR) 20 MG tablet Take 10 mg by mouth daily.    Marland Kitchen ELIQUIS 5 MG TABS tablet TAKE 1 TABLET BY MOUTH TWICE A DAY 60 tablet 0  . metoprolol tartrate (LOPRESSOR) 25 MG tablet Take 25 mg by mouth 2 (two) times daily.    . Multiple Vitamin (MULTIVITAMIN WITH MINERALS) TABS  tablet Take 1 tablet by mouth daily.    . nitroGLYCERIN (NITROSTAT) 0.4 MG SL tablet Place 0.4 mg under the tongue every 5 (five) minutes as needed for chest pain.    . Omega-3 Fatty Acids (FISH OIL PO) Take 1 tablet by mouth daily.    . polyethylene glycol-electrolytes (NULYTELY/GOLYTELY) 420 G solution Take 4,000 mLs by mouth as directed.  0   No current facility-administered medications for this visit.    Allergies:   Review of patient's allergies indicates no known allergies.   Social History:  The patient  reports that he has never smoked. He quit smokeless tobacco use about 16 years ago. His smokeless tobacco use included Chew. He reports that he does not drink alcohol or use illicit drugs.   Family History:  The patient's family history includes Cancer in his father; Heart attack in his maternal uncle.    ROS:  Please see the history of present illness.   Otherwise, review of systems is negative .    PHYSICA morningL EXAM: VS:  BP 110/72 mmHg  Pulse 74  Ht 5\' 5"  (1.651 m)  Wt 177 lb 9.6 oz (80.559 kg)  BMI 29.55 kg/m2 , BMI Body mass index is 29.55 kg/(m^2). GEN: Well nourished, well developed, in no acute distress HEENT: normal Neck: JVD flat, carotid bruits, or masses Cardiac: IRR RR; no murmur  rubs,  noS4  Respiratory:  clear to auscultation bilaterally, normal work of  breathing Back without kyphosis or CVAT GI: soft, nontender, nondistended, + BS MS: no deformity or atrophy Skin: warm and dry,   Extremities No Clubbing cyanosis  Edema Neuro:  Strength and sensation are intact Psych: euthymic mood, full affect  EKG:  EKG is ordered today. The ekg ordered today  atrial fibrillation at a rate of 74 Intervals-/10/38 Axis left -65 Incomplete right bundle branch block.   Recent Labs: No results found for requested labs within last 365 days.    Lipid Panel     Component Value Date/Time   CHOL 168 05/04/2014 0110   TRIG 362* 05/04/2014 0110   HDL 30* 05/04/2014  0110   CHOLHDL 5.6 05/04/2014 0110   VLDL 72* 05/04/2014 0110   LDLCALC 66 05/04/2014 0110     Wt Readings from Last 3 Encounters:  06/06/15 177 lb 9.6 oz (80.559 kg)  02/07/15 158 lb (71.668 kg)  06/07/14 178 lb (80.74 kg)      Other studies Reviewed: Additional studies/ records that were reviewed today include: hospital recordsReview of the above records today demonstrates: As above   ASSESSMENT AND PLAN:   Atrial fibrillation   stroke with right hemiparesis and aphasia-marked resolution   Ischemic heart disease with prior infarction and prior stenting  Sleep disordered breathing He has no symptoms of associated with his atrial fibrillation. At this point we will continue him on anticoagulation with apixaban.   We will attempt cardioversion. He has been in atrial fibrillation known for a year. LV function LA size were normal about a year ago.    Current medicines are reviewed at length with the patient today.   The patient does not have concerns regarding his medicines.  The following changes were made today:  none  Labs/ tests ordered today include:    No orders of the defined types were placed in this encounter.     D   Signed, Casey Axe, MD  06/06/2015 2:00 PM     [address] [place] [address] [phone] (office) [phone] (fax)

## 2015-06-06 NOTE — Patient Instructions (Signed)
Medication Instructions: - Your physician recommends that you continue on your current medications as directed. Please refer to the Current Medication list given to you today.  Labwork: - Your physician recommends that you have lab work today: CBC/ BMP  Procedures/Testing: - Your physician has recommended that you have a Cardioversion (DCCV). Electrical Cardioversion uses a jolt of electricity to your heart either through paddles or wired patches attached to your chest. This is a controlled, usually prescheduled, procedure. Defibrillation is done under light anesthesia in the hospital, and you usually go home the day of the procedure. This is done to get your heart back into a normal rhythm. You are not awake for the procedure. Please see the instruction sheet given to you today.  Follow-Up: - Your physician recommends that you schedule a follow-up appointment: Roderic Palau, NP in the A-fib clinic - the week of 07/11/15  Any Additional Special Instructions Will Be Listed Below (If Applicable).     If you need a refill on your cardiac medications before your next appointment, please call your pharmacy.

## 2015-06-11 ENCOUNTER — Other Ambulatory Visit: Payer: Self-pay | Admitting: Internal Medicine

## 2015-06-19 MED ORDER — SODIUM CHLORIDE 0.9 % IV SOLN
INTRAVENOUS | Status: DC
  Administered 2015-06-20: 13:00:00 via INTRAVENOUS

## 2015-06-20 ENCOUNTER — Ambulatory Visit (HOSPITAL_COMMUNITY)
Admission: RE | Admit: 2015-06-20 | Discharge: 2015-06-20 | Disposition: A | Discharge status: Home / Self Care | Payer: BC Managed Care – PPO | Source: Ambulatory Visit | Attending: Cardiovascular Disease | Admitting: Cardiovascular Disease

## 2015-06-20 ENCOUNTER — Encounter (HOSPITAL_COMMUNITY): Payer: Self-pay | Admitting: *Deleted

## 2015-06-20 ENCOUNTER — Ambulatory Visit (HOSPITAL_COMMUNITY): Payer: BC Managed Care – PPO | Admitting: Anesthesiology

## 2015-06-20 ENCOUNTER — Encounter (HOSPITAL_COMMUNITY)
Admission: RE | Disposition: A | Discharge status: Home / Self Care | Payer: Self-pay | Source: Ambulatory Visit | Attending: Cardiovascular Disease

## 2015-06-20 DIAGNOSIS — I4891 Unspecified atrial fibrillation: Secondary | ICD-10-CM | POA: Diagnosis not present

## 2015-06-20 DIAGNOSIS — I480 Paroxysmal atrial fibrillation: Secondary | ICD-10-CM

## 2015-06-20 DIAGNOSIS — Z8673 Personal history of transient ischemic attack (TIA), and cerebral infarction without residual deficits: Secondary | ICD-10-CM | POA: Insufficient documentation

## 2015-06-20 DIAGNOSIS — Z955 Presence of coronary angioplasty implant and graft: Secondary | ICD-10-CM | POA: Diagnosis not present

## 2015-06-20 DIAGNOSIS — I252 Old myocardial infarction: Secondary | ICD-10-CM | POA: Insufficient documentation

## 2015-06-20 DIAGNOSIS — Z79899 Other long term (current) drug therapy: Secondary | ICD-10-CM | POA: Insufficient documentation

## 2015-06-20 DIAGNOSIS — Z7901 Long term (current) use of anticoagulants: Secondary | ICD-10-CM | POA: Insufficient documentation

## 2015-06-20 DIAGNOSIS — I259 Chronic ischemic heart disease, unspecified: Secondary | ICD-10-CM | POA: Insufficient documentation

## 2015-06-20 DIAGNOSIS — E785 Hyperlipidemia, unspecified: Secondary | ICD-10-CM | POA: Insufficient documentation

## 2015-06-20 DIAGNOSIS — I2510 Atherosclerotic heart disease of native coronary artery without angina pectoris: Secondary | ICD-10-CM | POA: Insufficient documentation

## 2015-06-20 DIAGNOSIS — I639 Cerebral infarction, unspecified: Secondary | ICD-10-CM

## 2015-06-20 DIAGNOSIS — F17220 Nicotine dependence, chewing tobacco, uncomplicated: Secondary | ICD-10-CM | POA: Insufficient documentation

## 2015-06-20 DIAGNOSIS — G4730 Sleep apnea, unspecified: Secondary | ICD-10-CM | POA: Diagnosis not present

## 2015-06-20 HISTORY — PX: CARDIOVERSION: SHX1299

## 2015-06-20 SURGERY — CARDIOVERSION
Anesthesia: Monitor Anesthesia Care

## 2015-06-20 MED ORDER — PROPOFOL 10 MG/ML IV BOLUS
INTRAVENOUS | Status: DC | PRN
  Administered 2015-06-20: 90 mg via INTRAVENOUS

## 2015-06-20 MED ORDER — LIDOCAINE HCL (CARDIAC) 20 MG/ML IV SOLN
INTRAVENOUS | Status: DC | PRN
  Administered 2015-06-20: 60 mg via INTRATRACHEAL

## 2015-06-20 NOTE — Discharge Instructions (Signed)
Monitored Anesthesia Care °Monitored anesthesia care is an anesthesia service for a medical procedure. Anesthesia is the loss of the ability to feel pain. It is produced by medicines called anesthetics. It may affect a small area of your body (local anesthesia), a large area of your body (regional anesthesia), or your entire body (general anesthesia). The need for monitored anesthesia care depends your procedure, your condition, and the potential need for regional or general anesthesia. It is often provided during procedures where:  °· General anesthesia may be needed if there are complications. This is because you need special care when you are under general anesthesia.   °· You will be under local or regional anesthesia. This is so that you are able to have higher levels of anesthesia if needed.   °· You will receive calming medicines (sedatives). This is especially the case if sedatives are given to put you in a semi-conscious state of relaxation (deep sedation). This is because the amount of sedative needed to produce this state can be hard to predict. Too much of a sedative can produce general anesthesia. °Monitored anesthesia care is performed by one or more health care providers who have special training in all types of anesthesia. You will need to meet with these health care providers before your procedure. During this meeting, they will ask you about your medical history. They will also give you instructions to follow. (For example, you will need to stop eating and drinking before your procedure. You may also need to stop or change medicines you are taking.) During your procedure, your health care providers will stay with you. They will:  °· Watch your condition. This includes watching your blood pressure, breathing, and level of pain.   °· Diagnose and treat problems that occur.   °· Give medicines if they are needed. These may include calming medicines (sedatives) and anesthetics.   °· Make sure you are  comfortable.   °Having monitored anesthesia care does not necessarily mean that you will be under anesthesia. It does mean that your health care providers will be able to manage anesthesia if you need it or if it occurs. It also means that you will be able to have a different type of anesthesia than you are having if you need it. When your procedure is complete, your health care providers will continue to watch your condition. They will make sure any medicines wear off before you are allowed to go home.  °  °This information is not intended to replace advice given to you by your health care provider. Make sure you discuss any questions you have with your health care provider. °  °Document Released: 11/29/2004 Document Revised: 03/26/2014 Document Reviewed: 04/16/2012 °Elsevier Interactive Patient Education ©2016 [employer] Inc[employer] °Electrical Cardioversion, Care After °Refer to this sheet in the next few weeks. These instructions provide you with information on caring for yourself after your procedure. Your health care provider may also give you more specific instructions. Your treatment has been planned according to current medical practices, but problems sometimes occur. Call your health care provider if you have any problems or questions after your procedure. °WHAT TO EXPECT AFTER THE PROCEDURE °After your procedure, it is typical to have the following sensations: °· Some redness on the skin where the shocks were delivered. If this is tender, a sunburn lotion or hydrocortisone cream may help. °· Possible return of an abnormal heart rhythm within hours or days after the procedure. °HOME CARE INSTRUCTIONS °· Take medicines only as directed by your health care provider.   Be sure you understand how and when to take your medicine. °· Learn how to feel your pulse and check it often. °· Limit your activity for 48 hours after the procedure or as directed by your health care provider. °· Avoid or minimize caffeine and other  stimulants as directed by your health care provider. °SEEK MEDICAL CARE IF: °· You feel like your heart is beating too fast or your pulse is not regular. °· You have any questions about your medicines. °· You have bleeding that will not stop. °SEEK IMMEDIATE MEDICAL CARE IF: °· You are dizzy or feel faint. °· It is hard to breathe or you feel short of breath. °· There is a change in discomfort in your chest. °· Your speech is slurred or you have trouble moving an arm or leg on one side of your body. °· You get a serious muscle cramp that does not go away. °· Your fingers or toes turn cold or blue. °  °This information is not intended to replace advice given to you by your health care provider. Make sure you discuss any questions you have with your health care provider. °  °Document Released: 12/24/2012 Document Revised: 03/26/2014 Document Reviewed: 12/24/2012 °Elsevier Interactive Patient Education ©2016 Elsevier Inc. ° °

## 2015-06-20 NOTE — H&P (View-Only) (Signed)
Electrophysiology Office Note   Date:  06/06/2015   ID:  Casey Pierce, DOB 1955/08/08, MRN [id number]  PCP:  Irven Shelling, MD  Cardiologist:  Primary Electrophysiologist:    Virl Axe, MD    No chief complaint on file.   Wonder History of Present Illness: Casey Pierce is a 60 y.o. male    seen in follow-up a  with an acute stroke and telemetry demonstrated atrial fibrillation. He was started on apixaban. He also has a history of dyslipidemia;   He has a history of coronary artery disease with prior stenting by Dr. Tamala Julian about 15 years ago. He had residual nonobstructive disease.  He is on low-dose statin therapy  Echocardiogram demonstrated normal left ventricular function; left atrial size was also normal  Today, he denies symptoms of  chest pain, shortness of breath, orthopnea, PND, lower extremity edema, bleeding, or neurologic sequela.  he has no complaints of palpitations, lightheadedness presyncope or syncope  The patient is tolerating medications without difficulties and is otherwise without complaint today.    Past Medical History  Diagnosis Date  . Coronary artery disease   . Myocardial infarction ([place])   . Stroke [facility]) 04/2014   Past Surgical History  Procedure Laterality Date  . Coronary stent placement  2001  . Knee surgery  1972  . Colonoscopy with propofol N/A 02/07/2015    Procedure: COLONOSCOPY WITH PROPOFOL;  Surgeon: [name], MD;  Location: [facility] END[facility]COPY;  Service: Endoscopy;  Laterality: N/A;     Current Outpatient Prescriptions  Medication Sig Dispense Refill  . ALPHAGAN P 0.1 % SOLN Place 1 drop into both eyes 2 (two) times daily.   3  . atorvastatin (LIPITOR) 20 MG tablet Take 10 mg by mouth daily.    Marland Kitchen ELIQUIS 5 MG TABS tablet TAKE 1 TABLET BY MOUTH TWICE A DAY 60 tablet 0  . metoprolol tartrate (LOPRESSOR) 25 MG tablet Take 25 mg by mouth 2 (two) times daily.    . Multiple Vitamin (MULTIVITAMIN WITH MINERALS) TABS  tablet Take 1 tablet by mouth daily.    . nitroGLYCERIN (NITROSTAT) 0.4 MG SL tablet Place 0.4 mg under the tongue every 5 (five) minutes as needed for chest pain.    . Omega-3 Fatty Acids (FISH OIL PO) Take 1 tablet by mouth daily.    . polyethylene glycol-electrolytes (NULYTELY/GOLYTELY) 420 G solution Take 4,000 mLs by mouth as directed.  0   No current facility-administered medications for this visit.    Allergies:   Review of patient's allergies indicates no known allergies.   Social History:  The patient  reports that he has never smoked. He quit smokeless tobacco use about 16 years ago. His smokeless tobacco use included Chew. He reports that he does not drink alcohol or use illicit drugs.   Family History:  The patient's family history includes Cancer in his father; Heart attack in his maternal uncle.    ROS:  Please see the history of present illness.   Otherwise, review of systems is negative .    PHYSICA morningL EXAM: VS:  BP 110/72 mmHg  Pulse 74  Ht 5\' 5"  (1.651 m)  Wt 177 lb 9.6 oz (80.559 kg)  BMI 29.55 kg/m2 , BMI Body mass index is 29.55 kg/(m^2). GEN: Well nourished, well developed, in no acute distress HEENT: normal Neck: JVD flat, carotid bruits, or masses Cardiac: IRR RR; no murmur  rubs,  noS4  Respiratory:  clear to auscultation bilaterally, normal work of  breathing Back without kyphosis or CVAT GI: soft, nontender, nondistended, + BS MS: no deformity or atrophy Skin: warm and dry,   Extremities No Clubbing cyanosis  Edema Neuro:  Strength and sensation are intact Psych: euthymic mood, full affect  EKG:  EKG is ordered today. The ekg ordered today  atrial fibrillation at a rate of 74 Intervals-/10/38 Axis left -65 Incomplete right bundle branch block.   Recent Labs: No results found for requested labs within last 365 days.    Lipid Panel     Component Value Date/Time   CHOL 168 05/04/2014 0110   TRIG 362* 05/04/2014 0110   HDL 30* 05/04/2014  0110   CHOLHDL 5.6 05/04/2014 0110   VLDL 72* 05/04/2014 0110   LDLCALC 66 05/04/2014 0110     Wt Readings from Last 3 Encounters:  06/06/15 177 lb 9.6 oz (80.559 kg)  02/07/15 158 lb (71.668 kg)  06/07/14 178 lb (80.74 kg)      Other studies Reviewed: Additional studies/ records that were reviewed today include: hospital recordsReview of the above records today demonstrates: As above   ASSESSMENT AND PLAN:   Atrial fibrillation   stroke with right hemiparesis and aphasia-marked resolution   Ischemic heart disease with prior infarction and prior stenting  Sleep disordered breathing He has no symptoms of associated with his atrial fibrillation. At this point we will continue him on anticoagulation with apixaban.   We will attempt cardioversion. He has been in atrial fibrillation known for a year. LV function LA size were normal about a year ago.    Current medicines are reviewed at length with the patient today.   The patient does not have concerns regarding his medicines.  The following changes were made today:  none  Labs/ tests ordered today include:    No orders of the defined types were placed in this encounter.     D   Signed, Virl Axe, MD  06/06/2015 2:00 PM     [address] [place] [address] [phone] (office) [phone] (fax)

## 2015-06-20 NOTE — Interval H&P Note (Signed)
History and Physical Interval Note:  06/20/2015 1:06 PM  Casey Pierce  has presented today for surgery, with the diagnosis of AFIB  The various methods of treatment have been discussed with the patient and family. After consideration of risks, benefits and other options for treatment, the patient has consented to  Procedure(s): CARDIOVERSION (N/A) as a surgical intervention .  The patient's history has been reviewed, patient examined, no change in status, stable for surgery.  I have reviewed the patient's chart and labs.  Questions were answered to the patient's satisfaction.     Shanita Kanan

## 2015-06-20 NOTE — Anesthesia Preprocedure Evaluation (Addendum)
Anesthesia Evaluation  Patient identified by MRN, date of birth, ID band Patient awake    Reviewed: Allergy & Precautions, NPO status , Patient's Chart, lab work & pertinent test results  History of Anesthesia Complications Negative for: history of anesthetic complications  Airway Mallampati: II  TM Distance: >3 FB Neck ROM: Full    Dental  (+) Teeth Intact, Dental Advisory Given   Pulmonary neg pulmonary ROS,    breath sounds clear to auscultation       Cardiovascular + angina + CAD, + Past MI and + Cardiac Stents   Rhythm:Irregular Rate:Normal     Neuro/Psych Numbness in right hand CVA, Residual Symptoms negative psych ROS   GI/Hepatic negative GI ROS, Neg liver ROS,   Endo/Other    Renal/GU negative Renal ROS     Musculoskeletal negative musculoskeletal ROS (+)   Abdominal   Peds  Hematology   Anesthesia Other Findings   Reproductive/Obstetrics negative OB ROS                            Anesthesia Physical Anesthesia Plan  ASA: III  Anesthesia Plan: MAC   Post-op Pain Management:    Induction: Intravenous  Airway Management Planned: Mask and Natural Airway  Additional Equipment:   Intra-op Plan:   Post-operative Plan:   Informed Consent: I have reviewed the patients History and Physical, chart, labs and discussed the procedure including the risks, benefits and alternatives for the proposed anesthesia with the patient or authorized representative who has indicated his/her understanding and acceptance.   Dental advisory given  Plan Discussed with: CRNA, Anesthesiologist and Surgeon  Anesthesia Plan Comments:        Anesthesia Quick Evaluation

## 2015-06-20 NOTE — Transfer of Care (Signed)
Immediate Anesthesia Transfer of Care Note  Patient: Casey Pierce  Procedure(s) Performed: Procedure(s): CARDIOVERSION (N/A)  Patient Location: Endoscopy Unit  Anesthesia Type:MAC  Level of Consciousness: sedated and patient cooperative  Airway & Oxygen Therapy: Patient Spontanous Breathing and Patient connected to nasal cannula oxygen  Post-op Assessment: Report given to RN and Post -op Vital signs reviewed and stable  Post vital signs: Reviewed  Last Vitals:  Filed Vitals:   06/20/15 1154 06/20/15 1315  BP: 130/66 104/71  Pulse: 62 64  Resp: 19 19    Complications: No apparent anesthesia complications

## 2015-06-20 NOTE — Op Note (Signed)
Procedure: Electrical Cardioversion Indications:  Atrial Fibrillation  Procedure Details:  Consent: Risks of procedure as well as the alternatives and risks of each were explained to the (patient/caregiver).  Consent for procedure obtained.  Time Out: Verified patient identification, verified procedure, site/side was marked, verified correct patient position, special equipment/implants available, medications/allergies/relevent history reviewed, required imaging and test results available.  Performed  Patient placed on cardiac monitor, pulse oximetry, supplemental oxygen as necessary.  Sedation given: Propofol IV, Dr. Marcie Bal Pacer pads placed anterior and posterior chest.  Cardioverted 1 time(s).  Cardioversion with synchronized biphasic 120J shock.  Evaluation: Findings: Post procedure EKG shows: NSR Complications: None Patient did tolerate procedure well.  Time Spent Directly with the Patient:  30 minutes   Benedicto Capozzi 06/20/2015, 1:21 PM

## 2015-06-20 NOTE — Anesthesia Procedure Notes (Signed)
Procedure Name: MAC Date/Time: 06/20/2015 1:07 PM Performed by: Jenne Campus Pre-anesthesia Checklist: Patient identified, Emergency Drugs available, Suction available, Patient being monitored and Timeout performed Oxygen Delivery Method: Ambu bag

## 2015-06-21 ENCOUNTER — Encounter (HOSPITAL_COMMUNITY): Payer: Self-pay | Admitting: Cardiovascular Disease

## 2015-06-22 NOTE — Anesthesia Postprocedure Evaluation (Signed)
Anesthesia Post Note  Patient: Casey Pierce  Procedure(s) Performed: Procedure(s) (LRB): CARDIOVERSION (N/A)  Patient location during evaluation: PACU Anesthesia Type: MAC Level of consciousness: awake and alert Pain management: pain level controlled Vital Signs Assessment: post-procedure vital signs reviewed and stable Respiratory status: spontaneous breathing, nonlabored ventilation, respiratory function stable and patient connected to nasal cannula oxygen Cardiovascular status: stable and blood pressure returned to baseline Anesthetic complications: no    Last Vitals:  Filed Vitals:   06/20/15 1335 06/20/15 1345  BP: 111/72 114/75  Pulse: 68 64  Resp: 18 23    Last Pain: There were no vitals filed for this visit.               [place]

## 2015-07-03 ENCOUNTER — Other Ambulatory Visit: Payer: Self-pay | Admitting: Internal Medicine

## 2015-07-12 ENCOUNTER — Encounter (HOSPITAL_COMMUNITY): Payer: Self-pay | Admitting: Nurse Practitioner

## 2015-07-12 ENCOUNTER — Ambulatory Visit (HOSPITAL_COMMUNITY)
Admission: RE | Admit: 2015-07-12 | Discharge: 2015-07-12 | Disposition: A | Discharge status: Home / Self Care | Payer: BC Managed Care – PPO | Source: Ambulatory Visit | Attending: Nurse Practitioner | Admitting: Nurse Practitioner

## 2015-07-12 ENCOUNTER — Other Ambulatory Visit: Payer: Self-pay

## 2015-07-12 VITALS — BP 110/62 | HR 65 | Ht 65.0 in | Wt 177.0 lb

## 2015-07-12 DIAGNOSIS — Z7901 Long term (current) use of anticoagulants: Secondary | ICD-10-CM | POA: Insufficient documentation

## 2015-07-12 DIAGNOSIS — Z8673 Personal history of transient ischemic attack (TIA), and cerebral infarction without residual deficits: Secondary | ICD-10-CM | POA: Insufficient documentation

## 2015-07-12 DIAGNOSIS — Z955 Presence of coronary angioplasty implant and graft: Secondary | ICD-10-CM | POA: Diagnosis not present

## 2015-07-12 DIAGNOSIS — I480 Paroxysmal atrial fibrillation: Secondary | ICD-10-CM | POA: Diagnosis not present

## 2015-07-12 DIAGNOSIS — I2510 Atherosclerotic heart disease of native coronary artery without angina pectoris: Secondary | ICD-10-CM | POA: Diagnosis not present

## 2015-07-12 DIAGNOSIS — Z0189 Encounter for other specified special examinations: Secondary | ICD-10-CM | POA: Diagnosis present

## 2015-07-12 DIAGNOSIS — G4733 Obstructive sleep apnea (adult) (pediatric): Secondary | ICD-10-CM

## 2015-07-12 DIAGNOSIS — Z87891 Personal history of nicotine dependence: Secondary | ICD-10-CM | POA: Insufficient documentation

## 2015-07-12 DIAGNOSIS — I482 Chronic atrial fibrillation: Secondary | ICD-10-CM | POA: Insufficient documentation

## 2015-07-12 DIAGNOSIS — I252 Old myocardial infarction: Secondary | ICD-10-CM | POA: Diagnosis not present

## 2015-07-12 NOTE — Progress Notes (Signed)
Patient ID: Casey Pierce, male   DOB: [birth year], 59 y.o.   MRN: [id number]     Primary Care Physician: Irven Shelling, MD Referring Physician: Dr. [name] is a 60 y.o. male with a h/o CAD, afib discovered at time of CVA 2/16, no longterm deficients,  and has been in chronic afib since that time. At last visit with Dr. [name] it was decided to pursue cardioversion which pt did convert initially but is back in afib in the office today. He really does not feel that different in afib vrs SR. But, he does think that he would like to try restore SR, knowing that the longer he waits, the harder it may be able to restore SR and because he does not know if he will tolerate as well in a few years. His duaghtr is with him and has some concerns regarding his snoring an dapnea episodes as well as he says that he does often wake up tired and has daytime somnolence.  He does not drink alcohol or caffeine excessively,no smoking. He is active and normal weight. Normal structure of heart. After talking re antiarrythmic drugs, ( flecainide not an option due to CAD), pt is interested in [place], but will pursue the sleep study first. He is complaint with Eliquis.  Today, he denies symptoms of palpitations, chest pain, shortness of breath, orthopnea, PND, lower extremity edema, dizziness, presyncope, syncope, or neurologic sequela. The patient is tolerating medications without difficulties and is otherwise without complaint today.   Past Medical History  Diagnosis Date  . Coronary artery disease   . Myocardial infarction ([name])   . Stroke [facility]) 04/2014   Past Surgical History  Procedure Laterality Date  . Coronary stent placement  2001  . Knee surgery  1972  . Colonoscopy with propofol N/A 02/07/2015    Procedure: COLONOSCOPY WITH PROPOFOL;  Surgeon: [name], MD;  Location: [facility];  Service: Endoscopy;  Laterality: N/A;  . Cardioversion N/A 06/20/2015    Procedure:  CARDIOVERSION;  Surgeon: [name], MD;  Location: [facility];  Service: Cardiovascular;  Laterality: N/A;    Current Outpatient Prescriptions  Medication Sig Dispense Refill  . ALPHAGAN P 0.1 % SOLN Place 1 drop into both eyes 2 (two) times daily.   3  . atorvastatin (LIPITOR) 10 MG tablet Take 10 mg by mouth daily.    Marland Kitchen ELIQUIS 5 MG TABS tablet TAKE 1 TABLET BY MOUTH TWICE A DAY 60 tablet 10  . metoprolol tartrate (LOPRESSOR) 25 MG tablet Take 25 mg by mouth 2 (two) times daily.    . Multiple Vitamin (MULTIVITAMIN WITH MINERALS) TABS tablet Take 1 tablet by mouth daily.    . nitroGLYCERIN (NITROSTAT) 0.4 MG SL tablet Place 0.4 mg under the tongue every 5 (five) minutes as needed for chest pain.    . Omega-3 Fatty Acids (FISH OIL PO) Take 2 tablets by mouth 2 (two) times daily.      No current facility-administered medications for this encounter.    No Known Allergies  Social History   Social History  . Marital Status: Married    Spouse Name: Patty  . Number of Children: 3  . Years of Education: Associate   Occupational History  . [employer]     Social History Main Topics  . Smoking status: Never Smoker   . Smokeless tobacco: Former Systems developer    Types: [place] date: 03/20/1999  Comment: quit chewing 7 years ago  . Alcohol Use: No  . Drug Use: No  . Sexual Activity: Not on file   Other Topics Concern  . Not on file   Social History Narrative   Lives at home with wife.    Caffeine: Drinks coffee- about 3 cups per day       Family History  Problem Relation Age of Onset  . Cancer Father   . Heart attack Maternal Uncle     ROS- All systems are reviewed and negative except as per the HPI above  Physical Exam: Filed Vitals:   07/12/15 0905  BP: 110/62  Pulse: 65  Height: 5\' 5"  (1.651 m)  Weight: 177 lb (80.287 kg)    GEN- The patient is well appearing, alert and oriented x 3 today.   Head- normocephalic, atraumatic Eyes-  Sclera clear,  conjunctiva pink Ears- hearing intact Oropharynx- clear Neck- supple, no JVP Lymph- no cervical lymphadenopathy Lungs- Clear to ausculation bilaterally, normal work of breathing Heart- irregular rate and rhythm, no murmurs, rubs or gallops, PMI not laterally displaced GI- soft, NT, ND, + BS Extremities- no clubbing, cyanosis, or edema MS- no significant deformity or atrophy Skin- no rash or lesion Psych- euthymic mood, full affect Neuro- strength and sensation are intact  EKG- atrial fibrillation at 65 bpm, QRS int 80 ms,. Qtc 391 ms Epic records reviewed Echo-Left ventricle: The cavity size was normal. Wall thickness was normal. Systolic function was normal. The estimated ejection fraction was in the range of 50% to 55%. Wall motion was normal; there were no regional wall motion abnormalities. Left ventricular diastolic function parameters were normal. - Atrial septum: No defect or patent foramen ovale was identified.  Impressions:  - No cardiac source of emboli was indentified.  Assessment and Plan: 1. Chronic afib ERAF after cardioversion 4/3 Pt is interested in AAT to restore SR, but does snore and would like to purse sleep study first He will be scheduled for sleep study and after the test will return to the office( approximately 6 weeks) Will further discuss AAD therapy but after today's discussion, he is leaning toward tikosyn I will try to coordinate with PharmD for hospitalization at that time  Casey Pierce, [facility] [address] [place], [place] [address] [phone]

## 2015-09-07 ENCOUNTER — Ambulatory Visit (HOSPITAL_BASED_OUTPATIENT_CLINIC_OR_DEPARTMENT_OTHER): Payer: BC Managed Care – PPO | Attending: Nurse Practitioner | Admitting: Cardiology

## 2015-09-07 VITALS — Ht 65.0 in | Wt 170.0 lb

## 2015-09-07 DIAGNOSIS — G4733 Obstructive sleep apnea (adult) (pediatric): Secondary | ICD-10-CM | POA: Insufficient documentation

## 2015-09-07 DIAGNOSIS — I480 Paroxysmal atrial fibrillation: Secondary | ICD-10-CM | POA: Diagnosis not present

## 2015-09-07 DIAGNOSIS — R0683 Snoring: Secondary | ICD-10-CM | POA: Diagnosis not present

## 2015-10-12 ENCOUNTER — Telehealth: Payer: Self-pay | Admitting: Cardiology

## 2015-10-12 NOTE — Telephone Encounter (Signed)
Please let patient know that sleep study showed no significant sleep apnea.    

## 2015-10-12 NOTE — Procedures (Deleted)
    NAME: Casey Pierce DATE OF BIRTH:  02-Feb-1956 MEDICAL RECORD NUMBER [id number]  LOCATION: [facility]  PHYSICIAN: Traci Turner  DATE OF STUDY: 09/07/2015  SLEEP STUDY TYPE: Out of Center Sleep Test                REFERRING PHYSICIAN: Sherran Needs, NP  INDICATION FOR STUDY: ***  EPWORTH SLEEPINESS SCORE:   HEIGHT: 5\' 5"  (165.1 cm)  WEIGHT: 170 lb (77.1 kg)    Body mass index is 28.29 kg/m.  NECK SIZE: 16.25 in.  MEDICATIONS: ***  IMPRESSION:  ***    RECOMMENDATION:  ***   Traci Turner Diplomate, American Board of Sleep Medicine  ELECTRONICALLY SIGNED ON:  10/12/2015, 3:00 PM [facility] PH: [phone]   FX: [phone] [place]

## 2015-10-12 NOTE — Procedures (Signed)
      Patient Name: Casey Pierce, Casey Pierce Date: 09/07/2015 Gender: Male D.O.B: 11-14-1955 Age (years): 60 Referring Provider: Sherran Needs Height (inches): 12 Interpreting Physician: Fransico Him MD, ABSM Weight (lbs): 170 RPSGT: Neeriemer, Holly BMI: 28 MRN: [id number] Neck Size: 16.25  CLINICAL INFORMATION Sleep Study Type: NPSG Indication for sleep study: OSA Epworth Sleepiness Score: 4  SLEEP STUDY TECHNIQUE As per the AASM Manual for the Scoring of Sleep and Associated Events v2.3 (April 2016) with a hypopnea requiring 4% desaturations. The channels recorded and monitored were frontal, central and occipital EEG, electrooculogram (EOG), submentalis EMG (chin), nasal and oral airflow, thoracic and abdominal wall motion, anterior tibialis EMG, snore microphone, electrocardiogram, and pulse oximetry.  MEDICATIONS Patient's medications include: Reveiewed in the chart. Medications self-administered by patient during sleep study : No sleep medicine administered.  SLEEP ARCHITECTURE The study was initiated at 9:59:21 PM and ended at 4:38:09 AM. Sleep onset time was 30.5 minutes and the sleep efficiency was reduced at 79.6%. The total sleep time was 317.5 minutes. Stage REM latency was prolonged at 196.0 minutes. The patient spent 5.04% of the night in stage N1 sleep, 81.73% in stage N2 sleep, 0.00% in stage N3 and 13.23% in REM. Alpha intrusion was absent. Supine sleep was 31.97%.  RESPIRATORY PARAMETERS The overall apnea/hypopnea index (AHI) was 3.0 per hour. There were 6 total apneas, including 5 obstructive, 1 central and 0 mixed apneas. There were 10 hypopneas and 33 RERAs. The AHI during Stage REM sleep was 17.1 per hour. AHI while supine was 4.1 per hour. The mean oxygen saturation was 94.34%. The minimum SpO2 during sleep was 81.00%.  Time spent with oxygen desaturations < 88% was 2 minutes. Moderate snoring was noted during this study.  CARDIAC DATA The 2 lead  EKG demonstrated atrial fibrillation. The mean heart rate was 57.63 beats per minute. Other EKG findings include: None.  LEG MOVEMENT DATA  The total PLMS were 0 with a resulting PLMS index of 0.00. Associated arousal with leg movement index was 0.0 .  IMPRESSIONS - No significant obstructive sleep apnea occurred during this study (AHI = 3.0/h). - No significant central sleep apnea occurred during this study (CAI = 0.2/h). - Moderate oxygen desaturation was noted during this study (Min O2 = 81.00%). - The patient snored with Moderate snoring volume. - Atrial fibrillation was noted during this study. - Clinically significant periodic limb movements did not occur during sleep. No significant associated arousals.  DIAGNOSIS - Atrial Fibrillation  RECOMMENDATIONS - Avoid alcohol, sedatives and other CNS depressants that may result in sleep apnea and disrupt normal sleep architecture. - Sleep hygiene should be reviewed to assess factors that may improve sleep quality. - Weight management and regular exercise should be initiated or continued if appropriate.   [place], American Board of Sleep Medicine  ELECTRONICALLY SIGNED ON:  10/12/2015, 3:01 PM [place] PH: [phone]   FX: [phone] [place]

## 2015-10-24 NOTE — Telephone Encounter (Signed)
Informed patient of sleep study results.  Stated verbal understanding and he was appreciative of the call.

## 2016-05-22 ENCOUNTER — Other Ambulatory Visit: Payer: Self-pay | Admitting: Internal Medicine

## 2016-05-22 NOTE — Telephone Encounter (Signed)
Received refill request for Eliquis 5mg , pt wt-80.6kg, 61yrs old, Crea-0.72, last seen by Nolon Rod on 07/12/15. Refill sent as requested.

## 2016-05-24 ENCOUNTER — Encounter: Payer: Self-pay | Admitting: Internal Medicine

## 2016-08-30 ENCOUNTER — Telehealth: Payer: Self-pay | Admitting: Internal Medicine

## 2016-08-30 NOTE — Telephone Encounter (Signed)
Spoke with Casey Pierce  re  Message has noted  Chest pain has not taken ntg would like an appointment feels  This is from job related stress appointment made with Richardson Dopp pac for 09-05-16 at 9:45 am .Instructed Casey Pierce to try ntg if need be and  If notes increase in s/s to go to er for eval and tx.Casey Pierce verbalized understanding ./cy

## 2016-08-30 NOTE — Telephone Encounter (Signed)
Patient calling for an appt with Dr. [name]. Patient mentioned that is having some chest tightness that he believes may be stress-related. Patient states that he was not having any chest pains when asked. I did inform patient that Nira Conn is out of the office today and patient is okay.

## 2016-09-04 NOTE — Progress Notes (Signed)
Cardiology Office Note:    Date:  09/05/2016   ID:  Casey Pierce, DOB 1955/06/10, MRN [id number]  PCP:  Casey Orn, MD  Cardiologist:  Dr. Virl Pierce    Referring MD: Casey Orn, MD   Chief Complaint  Patient presents with  . Chest Pain    History of Present Illness:    Casey Pierce is a 61 y.o. male with a hx of prior stroke, atrial fibrillation, HL, CAD s/p prior PCI.  He failed DCCV last year and was seen in follow up in the [facility].  AAD therapy was discussed.  But, the patient has not been seen in follow up since 4/17.  He did have a sleep study last year that did not demonstrate obstructive sleep apnea.  He called in recently with c/o chest pain.    [name] returns for the evaluation of chest pain, fatigue. He is here with his daughter. Over the past couple of weeks, he has noted significant fatigue, especially in the afternoon. He does note dyspnea with exertion that seems to have worsened over the past 6 months. He had one episode of chest discomfort described as tightness last week. This occurred with emotional stress. It last about 15 minutes. He had some shortness of breath associated but no other associated symptoms. He denies orthopnea, edema. He denies syncope. He works at [employer]. 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 He is under a great amount of stress. He mainly feels bad in relation to stress from his job. He denies exertional chest discomfort.  But, he does admit to decreased exercise tolerance.   Prior CV studies:   The following studies were reviewed today:  Echo 2/16 EF 50-55, no RWMA  Past Medical History:  Diagnosis Date  . Coronary artery disease   . Myocardial infarction ([place])   . Stroke [facility]) 04/2014    Past Surgical History:  Procedure Laterality Date  . CARDIOVERSION N/A 06/20/2015   Procedure: CARDIOVERSION;  Surgeon: Casey Klein, MD;  Location: [facility];  Service: Cardiovascular;  Laterality: N/A;  .  COLONOSCOPY WITH PROPOFOL N/A 02/07/2015   Procedure: COLONOSCOPY WITH PROPOFOL;  Surgeon: Casey Fair, MD;  Location: [facility];  Service: Endoscopy;  Laterality: N/A;  . CORONARY STENT PLACEMENT  2001  . KNEE SURGERY  1972    Current Medications: Current Meds  Medication Sig  . ALPHAGAN P 0.1 % SOLN Place 1 drop into both eyes 2 (two) times daily.   Marland Kitchen atorvastatin (LIPITOR) 10 MG tablet Take 10 mg by mouth daily.  Marland Kitchen ELIQUIS 5 MG TABS tablet TAKE 1 TABLET BY MOUTH TWICE A DAY  . metoprolol tartrate (LOPRESSOR) 25 MG tablet Take 25 mg by mouth 2 (two) times daily.  . nitroGLYCERIN (NITROSTAT) 0.4 MG SL tablet Place 1 tablet (0.4 mg total) under the tongue every 5 (five) minutes as needed for chest pain.  . Omega-3 Fatty Acids (FISH OIL PO) Take 2 tablets by mouth 2 (two) times daily.      Allergies:   Patient has no known allergies.   Social History   Social History  . Marital status: Married    Spouse name: Casey Pierce  . Number of children: 3  . Years of education: Associate   Occupational History  . [employer]     Social History Main Topics  . Smoking status: Never Smoker  . Smokeless tobacco: Former Systems developer    Types: Loss adjuster, chartered  Quit date: 03/20/1999     Comment: quit chewing 7 years ago  . Alcohol use No  . Drug use: No  . Sexual activity: Not Asked   Other Topics Concern  . None   Social History Narrative   Lives at home with wife.    Caffeine: Drinks coffee- about 3 cups per day        Family Hx: The patient's family history includes Cancer in his father; Heart attack in his maternal uncle.  ROS:   Please see the history of present illness.    Review of Systems  Respiratory: Positive for shortness of breath.   Neurological: Positive for headaches.   All other systems reviewed and are negative.   EKGs/Labs/Other Test Reviewed:    EKG:  EKG is  ordered today.  The ekg ordered today demonstrates AFib, HR 61, LAD, no significant change since last  tracing  Recent Labs: No results found for requested labs within last 8760 hours.   Recent Lipid Panel Lab Results  Component Value Date/Time   CHOL 168 05/04/2014 01:10 AM   TRIG 362 (H) 05/04/2014 01:10 AM   HDL 30 (L) 05/04/2014 01:10 AM   CHOLHDL 5.6 05/04/2014 01:10 AM   LDLCALC 66 05/04/2014 01:10 AM    Physical Exam:    VS:  BP 120/60   Pulse 61   Ht 5\' 5"  (1.651 m)   Wt 170 lb 12.8 oz (77.5 kg)   SpO2 97%   BMI 28.42 kg/m     Wt Readings from Last 3 Encounters:  09/05/16 170 lb 12.8 oz (77.5 kg)  09/07/15 170 lb (77.1 kg)  07/12/15 177 lb (80.3 kg)     Physical Exam  Constitutional: He is oriented to person, place, and time. He appears well-developed and well-nourished. No distress.  HENT:  Head: Normocephalic and atraumatic.  Eyes: No scleral icterus.  Neck: Normal range of motion. No JVD present. Carotid bruit is not present.  Cardiovascular: Normal rate, S1 normal and S2 normal.  An irregularly irregular rhythm present.  No murmur heard. Pulmonary/Chest: Effort normal and breath sounds normal. He has no wheezes. He has no rhonchi. He has no rales.  Abdominal: Soft. There is no tenderness.  Musculoskeletal: He exhibits no edema.  Neurological: He is alert and oriented to person, place, and time.  Skin: Skin is warm and dry.  Psychiatric: He has a normal mood and affect.    ASSESSMENT:    1. Precordial pain   2. Other fatigue   3. Persistent atrial fibrillation ([place])   4. Coronary artery disease involving native coronary artery of native heart without angina pectoris   5. Hyperlipidemia, unspecified hyperlipidemia type    PLAN:    In order of problems listed above:  1. Precordial pain -  He presents with fatigue, decreased exercise tolerance and one episode of chest pain.  His chest pain sounds like angina.  However, he has only had one episode.  His ECG does not demonstrate any acute ST-TW changes.  He has a hx of CAD with prior MI in 2001.  He  has not had an assessment for ischemia in a long time.  He seems to have a lot of symptoms related to his job and emotional stress.  He has noted dyspnea on exertion that is somewhat worse over the past 6 months.  But, he has noted dyspnea on exertion since being dx with AFib.   -  Arrange echocardiogram   -  Arrange ETT-Myoview  -  Close FU 2 weeks.   2. Other fatigue - Suspect this is mainly related to stress.  Will get echo and Myoview as noted.  Also obtain TSH, BMET, CBC.  I will also give him a note to remain out of work for 2 weeks to allow him a chance to see how he feels with reduced stress.   3. Persistent atrial fibrillation (HCC) - Rate controlled. Check CBC, BMET.  Continue Apixaban.  He may be symptomatic with AF.  He did not want to consider Dofetilide in the past.  If testing ok, may need to consider rhythm control again.   4. Coronary artery disease - Hx of remote MI and PCI in 2000.  I cannot locate records. He is not on ASA as he is on Apixaban.  Refill NTG.  Continue statin, beta-blocker.   5. Hyperlipidemia - LDL optimal on most recent lab work.  Continue current Rx.    Dispo:  Return in about 2 weeks (around 09/19/2016) for Close Follow Up w/ Dr. [name].   Medication Adjustments/Labs and Tests Ordered: Current medicines are reviewed at length with the patient today.  Concerns regarding medicines are outlined above.  Orders/Tests:  Orders Placed This Encounter  Procedures  . Basic Metabolic Panel (BMET)  . CBC  . TSH  . Myocardial Perfusion Imaging  . EKG 12-Lead  . ECHOCARDIOGRAM COMPLETE   Medication changes: Meds ordered this encounter  Medications  . nitroGLYCERIN (NITROSTAT) 0.4 MG SL tablet    Sig: Place 1 tablet (0.4 mg total) under the tongue every 5 (five) minutes as needed for chest pain.    Dispense:  25 tablet    Refill:  3   Signed, Richardson Dopp, PA-C  09/05/2016 10:29 AM    [facility] [place], [place], [place]   [address] Phone: [phone]; Fax: [phone]

## 2016-09-05 ENCOUNTER — Telehealth: Payer: Self-pay | Admitting: *Deleted

## 2016-09-05 ENCOUNTER — Ambulatory Visit (INDEPENDENT_AMBULATORY_CARE_PROVIDER_SITE_OTHER): Payer: BC Managed Care – PPO | Admitting: Physician Assistant

## 2016-09-05 ENCOUNTER — Encounter: Payer: Self-pay | Admitting: Physician Assistant

## 2016-09-05 VITALS — BP 120/60 | HR 61 | Ht 65.0 in | Wt 170.8 lb

## 2016-09-05 DIAGNOSIS — E785 Hyperlipidemia, unspecified: Secondary | ICD-10-CM | POA: Diagnosis not present

## 2016-09-05 DIAGNOSIS — I2510 Atherosclerotic heart disease of native coronary artery without angina pectoris: Secondary | ICD-10-CM

## 2016-09-05 DIAGNOSIS — I4819 Other persistent atrial fibrillation: Secondary | ICD-10-CM

## 2016-09-05 DIAGNOSIS — I481 Persistent atrial fibrillation: Secondary | ICD-10-CM

## 2016-09-05 DIAGNOSIS — R072 Precordial pain: Secondary | ICD-10-CM

## 2016-09-05 DIAGNOSIS — R5383 Other fatigue: Secondary | ICD-10-CM

## 2016-09-05 LAB — CBC
Hematocrit: 45.8 % (ref 37.5–51.0)
Hemoglobin: 15.7 g/dL (ref 13.0–17.7)
MCH: 30.3 pg (ref 26.6–33.0)
MCHC: 34.3 g/dL (ref 31.5–35.7)
MCV: 88 fL (ref 79–97)
PLATELETS: 246 10*3/uL (ref 150–379)
RBC: 5.18 x10E6/uL (ref 4.14–5.80)
RDW: 13.1 % (ref 12.3–15.4)
WBC: 9.9 10*3/uL (ref 3.4–10.8)

## 2016-09-05 LAB — BASIC METABOLIC PANEL
BUN / CREAT RATIO: 12 (ref 10–24)
BUN: 10 mg/dL (ref 8–27)
CO2: 23 mmol/L (ref 20–29)
CREATININE: 0.85 mg/dL (ref 0.76–1.27)
Calcium: 9.6 mg/dL (ref 8.6–10.2)
Chloride: 101 mmol/L (ref 96–106)
GFR calc Af Amer: 108 mL/min/{1.73_m2} (ref >59)
GFR, EST NON AFRICAN AMERICAN: 93 mL/min/{1.73_m2} (ref >59)
Glucose: 115 mg/dL — ABNORMAL HIGH (ref 65–99)
POTASSIUM: 4.3 mmol/L (ref 3.5–5.2)
SODIUM: 138 mmol/L (ref 134–144)

## 2016-09-05 LAB — TSH: TSH: 4.52 u[IU]/mL — AB (ref 0.450–4.500)

## 2016-09-05 MED ORDER — NITROGLYCERIN 0.4 MG SL SUBL: 0.4000 mg | SUBLINGUAL_TABLET | SUBLINGUAL | 3 refills | Status: DC | PRN

## 2016-09-05 NOTE — Telephone Encounter (Signed)
Pt has been notified of lab results and findings by phone with verbal understanding. Pt advised to f/u w/PCP in 6-8 weeks per Nicki Reaper W. PA in regards to minimally elevated TSH. Pt is agreeable to plan of care and thanked me for my call.

## 2016-09-05 NOTE — Telephone Encounter (Signed)
-----   Message from Liliane Shi, [place] sent at 09/05/2016  4:48 PM EDT ----- Please call the patient Kidney function and hemoglobin are normal.  The TSH is minimally elevated and not causing his symptoms. But, he should have his TSH repeated with his PCP in ~ 6-8 weeks. Please send a copy of the labs to Lavone Orn, MD and make sure Mr. Bossier knows to follow up with him. Richardson Dopp, PA-C    09/05/2016 4:46 PM

## 2016-09-05 NOTE — Patient Instructions (Signed)
Medication Instructions:  1. AN REFILL FOR NTG HAS BEEN SENT IN  Labwork: 1. TODAY BMET, CBC, TSH  Testing/Procedures: 1. Your physician has requested that you have an echocardiogram. Echocardiography is a painless test that uses sound waves to create images of your heart. It provides your doctor with information about the size and shape of your heart and how well your heart's chambers and valves are working. This procedure takes approximately one hour. There are no restrictions for this procedure.  2. Your physician has requested that you have en exercise stress myoview. For further information please visit [email or web address]. Please follow instruction sheet, as given. PER SCOTT W. PAC PT WILL NEED TO TAKE HIS METOPROLOL, THOUGH OK PER SCOTT W.PAC OK TO CHANGE TO LEXISCAN IF HR NOT AT TARGET GOAL   Follow-Up: DR. [name] IN 2 WEEKS   Any Other Special Instructions Will Be Listed Below (If Applicable).     If you need a refill on your cardiac medications before your next appointment, please call your pharmacy.

## 2016-09-05 NOTE — Telephone Encounter (Signed)
Please see my office note from today. Richardson Dopp, PA-C    09/05/2016 1:25 PM

## 2016-09-13 ENCOUNTER — Telehealth (HOSPITAL_COMMUNITY): Payer: Self-pay | Admitting: *Deleted

## 2016-09-13 NOTE — Telephone Encounter (Signed)
Left message on voicemail in reference to upcoming appointment scheduled for 09/18/16. Phone number given for a call back so details instructions can be given. Casey Pierce

## 2016-09-17 ENCOUNTER — Telehealth (HOSPITAL_COMMUNITY): Payer: Self-pay | Admitting: Radiology

## 2016-09-17 NOTE — Telephone Encounter (Signed)
Patient given detailed instructions per Myocardial Perfusion Study Information Sheet for the test on 09/18/2016 at 9:45. Patient notified to arrive 15 minutes early and that it is imperative to arrive on time for appointment to keep from having the test rescheduled.  If you need to cancel or reschedule your appointment, please call the office within 24 hours of your appointment. . Patient verbalized understanding.EHK

## 2016-09-18 ENCOUNTER — Ambulatory Visit (HOSPITAL_BASED_OUTPATIENT_CLINIC_OR_DEPARTMENT_OTHER): Payer: BC Managed Care – PPO

## 2016-09-18 ENCOUNTER — Ambulatory Visit (HOSPITAL_COMMUNITY): Payer: BC Managed Care – PPO | Attending: Cardiovascular Disease

## 2016-09-18 ENCOUNTER — Telehealth: Payer: Self-pay | Admitting: *Deleted

## 2016-09-18 ENCOUNTER — Other Ambulatory Visit: Payer: Self-pay

## 2016-09-18 ENCOUNTER — Encounter: Payer: Self-pay | Admitting: Physician Assistant

## 2016-09-18 DIAGNOSIS — I4891 Unspecified atrial fibrillation: Secondary | ICD-10-CM | POA: Diagnosis not present

## 2016-09-18 DIAGNOSIS — R072 Precordial pain: Secondary | ICD-10-CM | POA: Diagnosis present

## 2016-09-18 DIAGNOSIS — R5383 Other fatigue: Secondary | ICD-10-CM | POA: Diagnosis not present

## 2016-09-18 DIAGNOSIS — I340 Nonrheumatic mitral (valve) insufficiency: Secondary | ICD-10-CM | POA: Insufficient documentation

## 2016-09-18 LAB — MYOCARDIAL PERFUSION IMAGING
CHL CUP NUCLEAR SDS: 0
CHL CUP NUCLEAR SRS: 2
LHR: 0.35
Peak HR: 109 {beats}/min
Rest HR: 51 {beats}/min
SSS: 2
TID: 1.08

## 2016-09-18 MED ORDER — TECHNETIUM TC 99M TETROFOSMIN IV KIT
32.5000 | PACK | Freq: Once | INTRAVENOUS | Status: AC | PRN
  Administered 2016-09-18: 32.5 via INTRAVENOUS
  Filled 2016-09-18: qty 33

## 2016-09-18 MED ORDER — TECHNETIUM TC 99M TETROFOSMIN IV KIT
10.2000 | PACK | Freq: Once | INTRAVENOUS | Status: AC | PRN
  Administered 2016-09-18: 10.2 via INTRAVENOUS
  Filled 2016-09-18: qty 11

## 2016-09-18 MED ORDER — REGADENOSON 0.4 MG/5ML IV SOLN
0.4000 mg | Freq: Once | INTRAVENOUS | Status: AC
  Administered 2016-09-18: 0.4 mg via INTRAVENOUS

## 2016-09-18 NOTE — Telephone Encounter (Signed)
-----   Message from Liliane Shi, [place] sent at 09/18/2016  1:50 PM EDT ----- Please call the patient. The echocardiogram demonstrates normal heart function (ejection fraction). Continue current management. Please fax a copy of this study result to his PCP:  Lavone Orn, MD  Thanks! Richardson Dopp, PA-C    09/18/2016 1:48 PM

## 2016-09-18 NOTE — Telephone Encounter (Signed)
Lmtcb for echo results 

## 2016-09-20 ENCOUNTER — Ambulatory Visit (INDEPENDENT_AMBULATORY_CARE_PROVIDER_SITE_OTHER): Payer: BC Managed Care – PPO | Admitting: Internal Medicine

## 2016-09-20 ENCOUNTER — Encounter: Payer: Self-pay | Admitting: Internal Medicine

## 2016-09-20 VITALS — BP 110/64 | HR 60 | Ht 65.0 in | Wt 169.0 lb

## 2016-09-20 DIAGNOSIS — I480 Paroxysmal atrial fibrillation: Secondary | ICD-10-CM | POA: Diagnosis not present

## 2016-09-20 DIAGNOSIS — I259 Chronic ischemic heart disease, unspecified: Secondary | ICD-10-CM | POA: Diagnosis not present

## 2016-09-20 NOTE — Telephone Encounter (Signed)
-----   Message from Liliane Shi, [place] sent at 09/18/2016  4:48 PM EDT ----- Please call the patient. The stress test is normal. Continue current management.  Please fax a copy of this study result to his PCP:  Lavone Orn, MD  Thanks! Richardson Dopp, PA-C    09/18/2016 4:47 PM

## 2016-09-20 NOTE — Progress Notes (Signed)
Electrophysiology Office Note   Date:  09/20/2016   ID:  IGNACE MANDIGO, DOB June 17, 1955, MRN [id number]  PCP:  Lavone Orn, MD  Cardiologist:  Primary Electrophysiologist:    Virl Axe, MD    No chief complaint on file.   Wonder History of Present Illness: MALEKE FERIA is a 61 y.o. male    seen in follow-up a  with an acute stroke and telemetry demonstrated atrial fibrillation. He was started on apixaban. He also has a history of dyslipidemia;   He has a history of coronary artery disease with prior stenting by Dr. Tamala Julian about 15 years ago. He had residual nonobstructive disease.  He is on low-dose statin therapy  DATE TEST    6/18    Echo   EF 55-60 %   7/18    Myoview   EF   % No ischemia         He was seen 09/05/16 with complaints of chest pressure and weaknes/LH  Is incurred at work. He underwent evaluation outlined above. It was unrevealing. He is not to work the last 2 weeks and has had no further symptoms. He acknowledges that work has been exceedingly stressful. He comes in requesting FMLA. He is  anticipated to return to work on Monday.  The patient denies chest pain, shortness of breath, nocturnal dyspnea, orthopnea or peripheral edema.  There have been no palpitations, lightheadedness or syncope.    The patient is tolerating medications without difficulties and is otherwise without complaint today.    Past Medical History:  Diagnosis Date  . Coronary artery disease    Myoview 7/18: normal perfusion, Low Risk  . History of echocardiogram    Echo 7/18: mild LVH, EF 60-65, no RWMA, mild MR, mod LAE  . Myocardial infarction ([place])   . Stroke [facility]) 04/2014   Past Surgical History:  Procedure Laterality Date  . CARDIOVERSION N/A 06/20/2015   Procedure: CARDIOVERSION;  Surgeon: Sanda Smriti Barkow, MD;  Location: [facility];  Service: Cardiovascular;  Laterality: N/A;  . COLONOSCOPY WITH PROPOFOL N/A 02/07/2015   Procedure: COLONOSCOPY WITH PROPOFOL;  Surgeon:  [name], MD;  Location: [facility];  Service: Endoscopy;  Laterality: N/A;  . CORONARY STENT PLACEMENT  2001  . KNEE SURGERY  1972     Current Outpatient Prescriptions  Medication Sig Dispense Refill  . ALPHAGAN P 0.1 % SOLN Place 1 drop into both eyes 2 (two) times daily.   3  . atorvastatin (LIPITOR) 10 MG tablet Take 10 mg by mouth daily.    Marland Kitchen ELIQUIS 5 MG TABS tablet TAKE 1 TABLET BY MOUTH TWICE A DAY 60 tablet 5  . metoprolol tartrate (LOPRESSOR) 25 MG tablet Take 25 mg by mouth 2 (two) times daily.    . nitroGLYCERIN (NITROSTAT) 0.4 MG SL tablet Place 1 tablet (0.4 mg total) under the tongue every 5 (five) minutes as needed for chest pain. 25 tablet 3  . Omega-3 Fatty Acids (FISH OIL PO) Take 1 tablet by mouth 2 (two) times daily.      No current facility-administered medications for this visit.     Allergies:   Patient has no known allergies.   Social History:  The patient  reports that he has never smoked. He quit smokeless tobacco use about 17 years ago. His smokeless tobacco use included Chew. He reports that he does not drink alcohol or use drugs.   Family History:  The patient's family history includes Cancer in his father;  Heart attack in his maternal uncle.    ROS:  Please see the history of present illness.   Otherwise, review of systems is negative .    PHYSICA morningL EXAM: VS:  BP 110/64   Pulse 60   Ht 5\' 5"  (1.651 m)   Wt 169 lb (76.7 kg)   SpO2 97%   BMI 28.12 kg/m  , BMI Body mass index is 28.12 kg/m. Well developed and nourished in no acute distress HENT normal Neck supple with JVP-flat Carotids brisk and full without bruits Clear Irregularly irregular rate and rhythm with controlled ventricular response, no murmurs or gallops Abd-soft with active BS without hepatomegaly No Clubbing cyanosis edema Skin-warm and dry A & Oriented  Grossly normal sensory and motor function t  EKG:    Atrial fibrillation at 62 Intervals-/09/42 Left  axis deviation -61  Recent Labs: 09/05/2016: BUN 10; Creatinine, Ser 0.85; Hemoglobin 15.7; Platelets 246; Potassium 4.3; Sodium 138; TSH 4.520    Lipid Panel     Component Value Date/Time   CHOL 168 05/04/2014 0110   TRIG 362 (H) 05/04/2014 0110   HDL 30 (L) 05/04/2014 0110   CHOLHDL 5.6 05/04/2014 0110   VLDL 72 (H) 05/04/2014 0110   LDLCALC 66 05/04/2014 0110     Wt Readings from Last 3 Encounters:  09/20/16 169 lb (76.7 kg)  09/18/16 170 lb (77.1 kg)  09/05/16 170 lb 12.8 oz (77.5 kg)      Other studies Reviewed: Additional studies/ records that were reviewed today include: hospital recordsReview of the above records today demonstrates: As above   ASSESSMENT AND PLAN:  Atrial fibrillation  stroke with right hemiparesis and aphasia-marked resolution   Ischemic heart disease with prior infarction and prior stenting  Sleep disordered breathing  Psychosocial Stress  He has no symptoms of associated with his atrial fibrillation. At this point we will continue him on anticoagulation with apixaban.   Without symptoms of ischemia  Distress  really quite debilitating. I wonder whether there may not be a component  of depression manifested sleep disturbance and irritability. I reached out to his primary care physician. I spoke with Dr POLITE covering for Dr Coral Spikes and they will followup  More than 50% of 45 min was spent in counseling related to the above   Current medicines are reviewed at length with the patient today.   The patient does not have concerns regarding his medicines.  The following changes were made today:  none  Labs/ tests ordered today include:    No orders of the defined types were placed in this encounter.    D   Signed, Virl Axe, MD  09/20/2016 1:58 PM     [address] [place] [address] [phone] (office) [phone] (fax)

## 2016-09-20 NOTE — Patient Instructions (Signed)

## 2016-09-20 NOTE — Telephone Encounter (Signed)
Lmtcb x 2 to go over test result.

## 2016-09-25 NOTE — Telephone Encounter (Signed)
Pt has been notified of both Myoview and Echo results by phone with verbal understanding. Pt thanked me for my call today. I will forward results to PCP as well.

## 2016-09-25 NOTE — Telephone Encounter (Signed)
-----   Message from Liliane Shi, [place] sent at 09/18/2016  4:48 PM EDT ----- Please call the patient. The stress test is normal. Continue current management.  Please fax a copy of this study result to his PCP:  Lavone Orn, MD  Thanks! Richardson Dopp, PA-C    09/18/2016 4:47 PM

## 2016-10-28 IMAGING — CT CT ANGIO HEAD
1 of 11 series · 1 of 33 positions shown · IV contrast (Iohexol (Omnipaque 350))
Comparison: None.

CLINICAL DATA: Nausea and vomiting beginning 1 hour prior to
arrival in the ER, subsequently nonverbal then altered, RIGHT hand
weakness.

EXAM:
CT ANGIOGRAPHY HEAD AND NECK
TECHNIQUE: Multidetector CT imaging of the head and neck was performed using
the standard protocol during bolus administration of intravenous
contrast. Multiplanar CT image reconstructions and MIPs were
obtained to evaluate the vascular anatomy. Carotid stenosis
measurements (when applicable) are obtained utilizing NASCET
criteria, using the distal internal carotid diameter as the
denominator.
CONTRAST:  50mL OMNIPAQUE IOHEXOL 350 MG/ML SOLN

[Series 300: locator · axial · 0.49mm/px · 1 of 1 slices shown]
[im 1/1  soft-tissue]
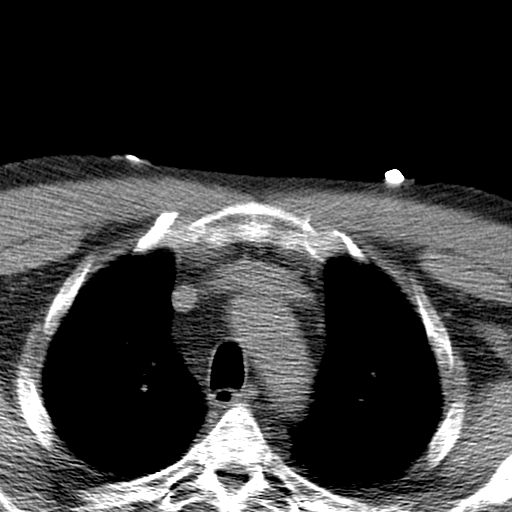

[1 of 33 positions shown; findings below may reference images not displayed]

FINDINGS: CT HEAD

The ventricles and sulci are normal. No intraparenchymal hemorrhage,
mass effect nor midline shift. No acute large vascular territory
infarcts. LEFT insular. Spine, axial 14/32.

No abnormal extra-axial fluid collections. Basal cisterns are
patent.

No skull fracture. The included ocular globes and orbital contents
are non-suspicious ; Bilateral drusen. Paranasal sinus mucosal
thickening without air-fluid levels. Mastoid air cells are well
aerated.

CTA NECK

Normal appearance of the thoracic arch, normal branch pattern. The
origins of the innominate, left Common carotid artery and subclavian
artery are widely patent.

Bilateral Common carotid arteries are widely patent, coursing in a
straight line fashion. Eccentric RIGHT greater LEFT intimal
thickening of the distal Common carotid arteries, trace calcific
atherosclerosis of the LEFT carotid bulb. Normal appearance of the
carotid bifurcations without hemodynamically significant stenosis by
NASCET criteria. Normal appearance of the included internal carotid
arteries. Bilateral tonsillar loops.

RIGHT vertebral artery is dominant. Normal appearance of the
vertebral arteries, which appear widely patent. Mild extrinsic
compression due to uncovertebral hypertrophy and facet arthropathy.

No hemodynamically significant stenosis by NASCET criteria. No
dissection, no pseudoaneurysm. No abnormal luminal irregularity. No
contrast extravasation.

Fatty replaced parotid glands with small intra parotid lymph nodes.
No acute osseous process though bone windows have not been
submitted.

CTA HEAD

Anterior circulation: Normal appearance of the cervical internal
carotid arteries, petrous, cavernous and supra clinoid internal
carotid arteries. Mild calcific atherosclerosis of the carotid
siphons. Widely patent anterior communicating artery. Normal
appearance of the anterior cerebral arteries. Focally occluded
branch of left M3 branch.

Posterior circulation: RIGHT vertebral artery is dominant with
normal appearance of the vertebral arteries, vertebrobasilar
junction and basilar artery, as well as main branch vessels. RIGHT
P1 infundibulum. LEFT vertebral artery terminates in the LEFT
posterior-inferior cerebellar artery. Robust RIGHT posterior
communicating artery present. Normal appearance of the posterior
cerebral arteries.

No large vessel occlusion, hemodynamically significant stenosis,
dissection, luminal irregularity, contrast extravasation or aneurysm
within the anterior nor posterior circulation.
IMPRESSION: CT head: Insular. Sign concerning for LEFT MCA thrombus without
large vessel occlusion.

CTA neck: Mild intimal thickening and calcific atherosclerosis
without hemodynamically significant stenosis.

CTA head: Tiny left M3 branch occlusion, this appears to suggest
migration of the M3 thrombus during the examination.

Acute findings discussed with and reconfirmed by Dr.Freda on
05/04/2014 at [DATE].

  By: Freddy Hauser

## 2016-11-21 ENCOUNTER — Other Ambulatory Visit: Payer: Self-pay | Admitting: Internal Medicine

## 2016-11-23 ENCOUNTER — Encounter: Payer: Self-pay | Admitting: Physician Assistant

## 2017-02-13 ENCOUNTER — Other Ambulatory Visit: Payer: Self-pay | Admitting: *Deleted

## 2017-02-13 MED ORDER — APIXABAN 5 MG PO TABS: 5.0000 mg | ORAL_TABLET | Freq: Two times a day (BID) | ORAL | 1 refills | Status: DC

## 2017-02-13 NOTE — Telephone Encounter (Signed)
Pharmacy requests a ninety day rx. 

## 2017-02-13 NOTE — Telephone Encounter (Signed)
Eliquis 5mg  refill request received; pt is 61 yrs old, wt-76.7kg, Crea-0.85 on 09/05/16, last seen by Dr. [name] on 09/20/16; refill request sent as requested.

## 2017-09-13 ENCOUNTER — Encounter: Payer: Self-pay | Admitting: Internal Medicine

## 2017-09-23 ENCOUNTER — Encounter: Payer: Self-pay | Admitting: Internal Medicine

## 2017-09-23 ENCOUNTER — Ambulatory Visit: Payer: BC Managed Care – PPO | Admitting: Internal Medicine

## 2017-09-23 VITALS — BP 104/76 | HR 59 | Ht 65.0 in | Wt 165.8 lb

## 2017-09-23 DIAGNOSIS — I480 Paroxysmal atrial fibrillation: Secondary | ICD-10-CM

## 2017-09-23 DIAGNOSIS — I259 Chronic ischemic heart disease, unspecified: Secondary | ICD-10-CM

## 2017-09-23 NOTE — Patient Instructions (Signed)
Medication Instructions:  Your physician recommends that you continue on your current medications as directed. Please refer to the Current Medication list given to you today.  Labwork: None ordered.  Testing/Procedures: None ordered.  Follow-Up: Your physician wants you to follow-up in: One Year with Tommye Standard, PA.  You will receive a reminder letter in the mail two months in advance. If you don't receive a letter, please call our office to schedule the follow-up appointment.    Any Other Special Instructions Will Be Listed Below (If Applicable).     If you need a refill on your cardiac medications before your next appointment, please call your pharmacy.

## 2017-09-23 NOTE — Progress Notes (Signed)
Electrophysiology Office Note   Date:  09/23/2017   ID:  Casey Pierce, DOB 29-Mar-1955, MRN [id number]  PCP:  Lavone Orn, MD  Cardiologist:  Primary Electrophysiologist:    Virl Axe, MD    No chief complaint on file.   Wonder History of Present Illness: Casey Pierce is a 62 y.o. male    seen in follow-up a  with an acute stroke and telemetry demonstrated atrial fibrillation. He was started on apixaban. He also has a history of dyslipidemia;   He has a history of coronary artery disease with prior stenting by Dr. Tamala Julian about 15 years ago. He had residual nonobstructive disease.  He is on low-dose statin therapy  DATE TEST EF   6/18    Echo   EF 55-60 %   7/18    Myoview   EF   % No ischemia          Date Cr Hgb  6/18 0.85 15.7   3/19 0.92 16.3    He was seen 09/05/16 with complaints of chest pressure and weaknes/LH  Is incurred at work. He underwent evaluation outlined above. It was unrevealing.   Since having been seen last year he is now retired.  He feels terrific.  The patient denies chest pain, shortness of breath, nocturnal dyspnea, orthopnea or peripheral edema.  There have been no palpitations, lightheadedness or syncope.   On Anticoagulation;  No bleeding issues  .     Past Medical History:  Diagnosis Date  . Coronary artery disease    NSTEMI 12/1998 - LHC:  prox 85-90, RCA prox 30-50, EF 65-70 >> XAJ:[id number] and BMS to LAD; large Dx jailed by stent (90%), EF 60  // Myoview 7/18: normal perfusion, Low Risk  . History of echocardiogram    Echo 7/18: mild LVH, EF 60-65, no RWMA, mild MR, mod LAE  . Myocardial infarction ([place])   . Stroke [facility]) 04/2014   Past Surgical History:  Procedure Laterality Date  . CARDIOVERSION N/A 06/20/2015   Procedure: CARDIOVERSION;  Surgeon: Sanda Caliyah Sieh, MD;  Location: [facility];  Service: Cardiovascular;  Laterality: N/A;  . COLONOSCOPY WITH PROPOFOL N/A 02/07/2015   Procedure: COLONOSCOPY WITH PROPOFOL;  Surgeon:  [name], MD;  Location: [facility];  Service: Endoscopy;  Laterality: N/A;  . CORONARY STENT PLACEMENT  2001  . KNEE SURGERY  1972     Current Outpatient Medications  Medication Sig Dispense Refill  . ALPHAGAN P 0.1 % SOLN Place 1 drop into both eyes 2 (two) times daily.   3  . apixaban (ELIQUIS) 5 MG TABS tablet Take 1 tablet (5 mg total) by mouth 2 (two) times daily. 180 tablet 1  . atorvastatin (LIPITOR) 10 MG tablet Take 10 mg by mouth daily.    . Magnesium 100 MG CAPS Take 1 capsule by mouth 2 (two) times daily.    . metoprolol tartrate (LOPRESSOR) 25 MG tablet Take 25 mg by mouth 2 (two) times daily.    . Multiple Vitamins-Minerals (MULTIVITAMIN ADULT PO) Take 1 tablet by mouth 2 (two) times daily.    . nitroGLYCERIN (NITROSTAT) 0.4 MG SL tablet Place 1 tablet (0.4 mg total) under the tongue every 5 (five) minutes as needed for chest pain. 25 tablet 3  . Omega-3 Fatty Acids (FISH OIL PO) Take 1 tablet by mouth 2 (two) times daily.      No current facility-administered medications for this visit.     Allergies:   Patient has  no known allergies.   Social History:  The patient  reports that he has never smoked. He quit smokeless tobacco use about 18 years ago. His smokeless tobacco use included chew. He reports that he does not drink alcohol or use drugs.   Family History:  The patient's family history includes Cancer in his father; Heart attack in his maternal uncle.    ROS:  Please see the history of present illness.   Otherwise, review of systems is negative .    PHYSICA morningL EXAM: VS:  BP 104/76   Pulse (!) 59   Ht 5\' 5"  (1.651 m)   Wt 165 lb 12.8 oz (75.2 kg)   SpO2 99%   BMI 27.59 kg/m  , BMI Body mass index is 27.59 kg/m. Well developed and nourished in no acute distress HENT normal Neck supple with JVP-flat Carotids brisk and full without bruits Clear Irregularly irregular rate and rhythm with controlled ventricular response, no murmurs or  gallops Abd-soft with active BS without hepatomegaly No Clubbing cyanosis edema Skin-warm and dry A & Oriented  Grossly normal sensory and motor function    EKG:    Atrial fibrillation at 59 Intervals-/12/44  right bundle branch block   Lipid Panel     Component Value Date/Time   CHOL 168 05/04/2014 0110   TRIG 362 (H) 05/04/2014 0110   HDL 30 (L) 05/04/2014 0110   CHOLHDL 5.6 05/04/2014 0110   VLDL 72 (H) 05/04/2014 0110   LDLCALC 66 05/04/2014 0110     Wt Readings from Last 3 Encounters:  09/23/17 165 lb 12.8 oz (75.2 kg)  09/20/16 169 lb (76.7 kg)  09/18/16 170 lb (77.1 kg)      Other studies Reviewed: Additional studies/ records that were reviewed today include: hospital recordsReview of the above records today demonstrates: As above   ASSESSMENT AND PLAN:  Atrial fibrillation-permanent  Stroke with right hemiparesis and aphasia-marked resolution   Ischemic heart disease with prior infarction and prior stenting  Sleep disordered breathing  Psychosocial Stress-improved post retirement   On Anticoagulation;  No bleeding issues   No symptoms    Without symptoms of ischemia   Labs/ tests ordered today include:    Orders Placed This Encounter  Procedures  . EKG 12-Lead        Signed, Virl Axe, MD  09/23/2017 12:07 PM     [place] [address] [phone] (office) [phone] (fax)

## 2018-02-21 ENCOUNTER — Telehealth: Payer: Self-pay | Admitting: Internal Medicine

## 2018-02-21 MED ORDER — APIXABAN 5 MG PO TABS: 5.0000 mg | ORAL_TABLET | Freq: Two times a day (BID) | ORAL | 1 refills | Status: DC

## 2018-02-21 NOTE — Telephone Encounter (Signed)
New Message    *STAT* If patient is at the pharmacy, call can be transferred to refill team.   1. Which medications need to be refilled? (please list name of each medication and dose if known) apixaban (ELIQUIS) 5 MG TABS tablet  2. Which pharmacy/location (including street and city if local pharmacy) is medication to be sent to? [id number] [place], [place]  3. Do they need a 30 day or 90 day supply? [place]

## 2018-08-30 ENCOUNTER — Other Ambulatory Visit: Payer: Self-pay | Admitting: Internal Medicine

## 2018-09-01 ENCOUNTER — Other Ambulatory Visit: Payer: Self-pay | Admitting: Internal Medicine

## 2018-09-01 NOTE — Telephone Encounter (Signed)
New Message      *STAT* If patient is at the pharmacy, call can be transferred to refill team.   1. Which medications need to be refilled? (please list name of each medication and dose if known) Eliquis   2. Which pharmacy/location (including street and city if local pharmacy) is medication to be sent to? [facility] [address]   3. Do they need a 30 day or 90 day supply? [place]

## 2018-09-01 NOTE — Telephone Encounter (Signed)
Received Eliquis refill request. While reviewing chart Eliquis refill had been sent this morning, however, [facility]. [facility] [address] [facility]  (take 1 tablet by mouth twice daily).  90 day supply with 1 refill.

## 2018-09-01 NOTE — Telephone Encounter (Signed)
Age 63, weight 75kg, SCr 0.85 at [facility] on 08/28/18, afib indication, last OV 09/2017

## 2018-11-10 ENCOUNTER — Ambulatory Visit (INDEPENDENT_AMBULATORY_CARE_PROVIDER_SITE_OTHER): Payer: BC Managed Care – PPO | Admitting: Internal Medicine

## 2018-11-10 ENCOUNTER — Encounter: Payer: Self-pay | Admitting: Internal Medicine

## 2018-11-10 ENCOUNTER — Other Ambulatory Visit: Payer: Self-pay

## 2018-11-10 VITALS — BP 98/62 | HR 59 | Ht 65.0 in | Wt 171.0 lb

## 2018-11-10 DIAGNOSIS — I480 Paroxysmal atrial fibrillation: Secondary | ICD-10-CM | POA: Diagnosis not present

## 2018-11-10 DIAGNOSIS — I259 Chronic ischemic heart disease, unspecified: Secondary | ICD-10-CM

## 2018-11-10 DIAGNOSIS — I25119 Atherosclerotic heart disease of native coronary artery with unspecified angina pectoris: Secondary | ICD-10-CM

## 2018-11-10 NOTE — Progress Notes (Signed)
Electrophysiology Office Note   Date:  11/10/2018   ID:  Casey Pierce, DOB 07-10-1955, MRN [id number]  PCP:  Lavone Orn, MD  Cardiologist:  Primary Electrophysiologist:    Virl Axe, MD      History of Present Illness: Casey Pierce is a 63 y.o. male    seen in follow-up for a stroke and telemetry demonstrated atrial fibrillation. He was started on apixaban. He also has a history of dyslipidemia;   He has a history of coronary artery disease with prior stenting by Dr. Tamala Julian about 15 years ago. He had residual nonobstructive disease.   The patient denies chest pain, nocturnal dyspnea, orthopnea or peripheral edema.  There have been no palpitations or syncope.  He does note exertional lightheadedness and some dyspnea " my afib is kicking in."   DATE TEST EF   6/18    Echo   EF 55-60 %   7/18    Myoview   EF   % No ischemia          Date Cr Hgb  6/18 0.85 15.7   3/19 0.92 16.3  6/20 0.85 15.9         Past Medical History:  Diagnosis Date  . Coronary artery disease    NSTEMI 12/1998 - LHC:  prox 85-90, RCA prox 30-50, EF 65-70 >> [id number] and BMS to LAD; large Dx jailed by stent (90%), EF 60  // Myoview 7/18: normal perfusion, Low Risk  . History of echocardiogram    Echo 7/18: mild LVH, EF 60-65, no RWMA, mild MR, mod LAE  . Myocardial infarction ([place])   . Stroke [facility]) 04/2014   Past Surgical History:  Procedure Laterality Date  . CARDIOVERSION N/A 06/20/2015   Procedure: CARDIOVERSION;  Surgeon: Sanda Munir Victorian, MD;  Location: [facility];  Service: Cardiovascular;  Laterality: N/A;  . COLONOSCOPY WITH PROPOFOL N/A 02/07/2015   Procedure: COLONOSCOPY WITH PROPOFOL;  Surgeon: [name], MD;  Location: [facility];  Service: Endoscopy;  Laterality: N/A;  . CORONARY STENT PLACEMENT  2001  . KNEE SURGERY  1972     Current Outpatient Medications  Medication Sig Dispense Refill  . ALPHAGAN P 0.1 % SOLN Place 1 drop into both eyes 2 (two) times daily.    3  . atorvastatin (LIPITOR) 10 MG tablet Take 10 mg by mouth daily.    Marland Kitchen ELIQUIS 5 MG TABS tablet TAKE 1 TABLET BY MOUTH TWICE A DAY 180 tablet 1  . Magnesium 100 MG CAPS Take 1 capsule by mouth 2 (two) times daily.    . metoprolol tartrate (LOPRESSOR) 25 MG tablet Take 25 mg by mouth 2 (two) times daily.    . Multiple Vitamins-Minerals (MULTIVITAMIN ADULT PO) Take 1 tablet by mouth 2 (two) times daily.    . nitroGLYCERIN (NITROSTAT) 0.4 MG SL tablet Place 1 tablet (0.4 mg total) under the tongue every 5 (five) minutes as needed for chest pain. 25 tablet 3  . Omega-3 Fatty Acids (FISH OIL PO) Take 1 tablet by mouth 2 (two) times daily.      No current facility-administered medications for this visit.     Allergies:   Patient has no known allergies.   Social History:  The patient  reports that he has never smoked. He quit smokeless tobacco use about 19 years ago.  His smokeless tobacco use included chew. He reports that he does not drink alcohol or use drugs.   Family History:  The patient's family history  includes Cancer in his father; Heart attack in his maternal uncle.    ROS:  Please see the history of present illness.   Otherwise, review of systems is negative .    PHYSICAL EXAM: BP 98/62   Pulse (!) 59   Ht 5\' 5"  (1.651 m)   Wt 171 lb (77.6 kg)   SpO2 98%   BMI 28.46 kg/m  Well developed and nourished in no acute distress HENT normal Neck supple with JVP-flat Carotids brisk and full without bruits Clear Irregularly irregular rate and rhythm with controlled ventricular response, no murmurs or gallops Abd-soft with active BS without hepatomegaly No Clubbing cyanosis edema Skin-warm and dry A & Oriented  Grossly normal sensory and motor function    Lipid Panel     Component Value Date/Time   CHOL 168 05/04/2014 0110   TRIG 362 (H) 05/04/2014 0110   HDL 30 (L) 05/04/2014 0110   CHOLHDL 5.6 05/04/2014 0110   VLDL 72 (H) 05/04/2014 0110   LDLCALC 66 05/04/2014 0110      Wt Readings from Last 3 Encounters:  11/10/18 171 lb (77.6 kg)  09/23/17 165 lb 12.8 oz (75.2 kg)  09/20/16 169 lb (76.7 kg)    ECG  Afib @  59 -/12/42   ASSESSMENT AND PLAN:  Atrial fibrillation-permanent  RBBB  Stroke with right hemiparesis and aphasia-marked resolution   Ischemic heart disease with prior infarction and prior stenting  Dyspnea with exertion   Sleep disordered breathing  Statin low dose    On Anticoagulation;  No bleeding issues   Dyspnea with exertion is stable over the last few years, unassociated with chest pain and much better then 3-5 years ago  Interval neg myoview  His statin dose is low, but LDL 6/20 is only 31  Hard to know that it is the right thing to do to increase statin       Signed, Virl Axe, MD  11/10/2018 9:11 AM     [facility] [address] [place] [address] [phone] (office) [phone] (fax)

## 2018-11-10 NOTE — Patient Instructions (Signed)

## 2019-03-27 ENCOUNTER — Other Ambulatory Visit: Payer: Self-pay | Admitting: *Deleted

## 2019-03-27 MED ORDER — APIXABAN 5 MG PO TABS: 5.0000 mg | ORAL_TABLET | Freq: Two times a day (BID) | ORAL | 1 refills | Status: DC

## 2019-03-27 NOTE — Telephone Encounter (Signed)
Prescription refill request for Eliquis received.  Last office visit: 11/10/2018, [name] Scr: 0.85, 08/28/2018, via kpn Age: 64 y.o. Weight: 77.6kg   Prescription refill sent.

## 2019-03-27 NOTE — Addendum Note (Signed)
Addended by: Johny Shock B on: 03/27/2019 10:44 AM   Modules accepted: Orders

## 2019-09-02 ENCOUNTER — Other Ambulatory Visit: Payer: Self-pay | Admitting: *Deleted

## 2019-09-02 MED ORDER — APIXABAN 5 MG PO TABS: 5.0000 mg | ORAL_TABLET | Freq: Two times a day (BID) | ORAL | 1 refills | Status: DC

## 2019-09-02 NOTE — Telephone Encounter (Signed)
Eliquis 5mg  refill request received. Patient is 64 years old, weight-77.6kg, Crea-0.90 on 08/31/2019 via KPN from [place] PCP, Diagnosis-Afib, and last seen by Dr. [name] on 11/10/2018 and next appt 11/30/2019 with Dr. [name]. Dose is appropriate based on dosing criteria. Will send in refill to requested pharmacy.

## 2019-11-28 DIAGNOSIS — I4510 Unspecified right bundle-branch block: Secondary | ICD-10-CM | POA: Insufficient documentation

## 2019-11-30 ENCOUNTER — Other Ambulatory Visit: Payer: Self-pay

## 2019-11-30 ENCOUNTER — Ambulatory Visit (INDEPENDENT_AMBULATORY_CARE_PROVIDER_SITE_OTHER): Payer: Medicare Other | Admitting: Internal Medicine

## 2019-11-30 ENCOUNTER — Encounter: Payer: Self-pay | Admitting: Internal Medicine

## 2019-11-30 VITALS — BP 104/80 | HR 43 | Ht 65.0 in | Wt 168.6 lb

## 2019-11-30 DIAGNOSIS — Z8673 Personal history of transient ischemic attack (TIA), and cerebral infarction without residual deficits: Secondary | ICD-10-CM | POA: Diagnosis not present

## 2019-11-30 DIAGNOSIS — I4510 Unspecified right bundle-branch block: Secondary | ICD-10-CM

## 2019-11-30 NOTE — Patient Instructions (Signed)

## 2019-11-30 NOTE — Progress Notes (Signed)
Electrophysiology Office Note   Date:  11/30/2019   ID:  Casey Pierce, DOB 08-07-1955, MRN [id number]  PCP:  Lavone Orn, MD  Cardiologist:  Primary Electrophysiologist:    Virl Axe, MD      History of Present Illness: Casey Pierce is a 64 y.o. male    seen in follow-up for a stroke and telemetry demonstrated atrial fibrillation. He was started on apixaban. He also has a history of dyslipidemia;   He has a history of coronary artery disease with prior stenting by Dr. Tamala Julian about 15 years ago. He had residual nonobstructive disease.  Underwent cardioversion last 2017-protracted atrial fibrillation-successful but within 3 weeks he had reverted to atrial fibrillation.  It was elected to treat him with rate control and anticoagulation.  The patient denies chest pain, nocturnal dyspnea, orthopnea or peripheral edema.  There have been no palpitations or syncope.  Notices some lightheadedness and shortness of breath with exerting, including pushing his garbage can up to the top of the street.  Does not notice it when he is playing golf.     DATE TEST EF   6/18    Echo   EF 55-60 %   7/18    Myoview   EF   % No ischemia          Date Cr K Hgb LDL   6/18 0.85  15.7   3/19 0.92  16.3   6/20 0.85  15.9   6/21 0.9 4.9 15.5 49     Thromboembolic risk factors ( age -68 , TIA/CVA-2, Vasc disease -1) for a CHADSVASc Score >=4     Past Medical History:  Diagnosis Date  . Coronary artery disease    NSTEMI 12/1998 - LHC:  prox 85-90, RCA prox 30-50, EF 65-70 >> [id number]:[id number] and BMS to LAD; large Dx jailed by stent (90%), EF 60  // Myoview 7/18: normal perfusion, Low Risk  . History of echocardiogram    Echo 7/18: mild LVH, EF 60-65, no RWMA, mild MR, mod LAE  . Myocardial infarction ([place])   . Stroke [facility]) 04/2014   Past Surgical History:  Procedure Laterality Date  . CARDIOVERSION N/A 06/20/2015   Procedure: CARDIOVERSION;  Surgeon: Sanda Giulia Hickey, MD;  Location: [place] [facility];   Service: Cardiovascular;  Laterality: N/A;  . COLONOSCOPY WITH PROPOFOL N/A 02/07/2015   Procedure: COLONOSCOPY WITH PROPOFOL;  Surgeon: [name], MD;  [facility];  Service: Endoscopy;  Laterality: N/A;  . CORONARY STENT PLACEMENT  2001  . KNEE SURGERY  1972     Current Outpatient Medications  Medication Sig Dispense Refill  . ALPHAGAN P 0.1 % SOLN Place 1 drop into both eyes 2 (two) times daily.   3  . apixaban (ELIQUIS) 5 MG TABS tablet Take 1 tablet (5 mg total) by mouth 2 (two) times daily. 180 tablet 1  . atorvastatin (LIPITOR) 10 MG tablet Take 10 mg by mouth daily.    . Magnesium 100 MG CAPS Take 1 capsule by mouth 2 (two) times daily.    . metoprolol tartrate (LOPRESSOR) 25 MG tablet Take 25 mg by mouth 2 (two) times daily.    . Multiple Vitamins-Minerals (MULTIVITAMIN ADULT PO) Take 1 tablet by mouth 2 (two) times daily.    . nitroGLYCERIN (NITROSTAT) 0.4 MG SL tablet Place 1 tablet (0.4 mg total) under the tongue every 5 (five) minutes as needed for chest pain. 25 tablet 3  . Omega-3 Fatty Acids (FISH OIL PO) Take  1 tablet by mouth 2 (two) times daily.      No current facility-administered medications for this visit.    Allergies:   Patient has no known allergies.      ROS:  Please see the history of present illness.   Otherwise, review of systems is negative .    PHYSICAL EXAM: BP 104/80   Pulse (!) 43   Ht 5\' 5"  (1.651 m)   Wt 168 lb 9.6 oz (76.5 kg)   SpO2 98%   BMI 28.06 kg/m   Well developed and nourished in no acute distress HENT normal Neck supple with JVP-flat Carotids brisk and full without bruits Clear Irregularly irregular rate and rhythm with slow ventricular response, no murmurs or gallops Abd-soft with active BS without hepatomegaly No Clubbing cyanosis edema Skin-warm and dry A & Oriented  Grossly normal sensory and motor function    Lipid Panel     Component Value Date/Time   CHOL 168 05/04/2014 0110   TRIG 362 (H)  05/04/2014 0110   HDL 30 (L) 05/04/2014 0110   CHOLHDL 5.6 05/04/2014 0110   VLDL 72 (H) 05/04/2014 0110   LDLCALC 66 05/04/2014 0110     Wt Readings from Last 3 Encounters:  11/10/18 171 lb (77.6 kg)  09/23/17 165 lb 12.8 oz (75.2 kg)  09/20/16 169 lb (76.7 kg)    ECG  Afib @  59 -/12/42   ASSESSMENT AND PLAN:  Atrial fibrillation-permanent  RBBB  Stroke with right hemiparesis and aphasia-marked resolution   Ischemic heart disease with prior infarction and prior stenting  Dyspnea with exertion   Bradycardia  Statin low dose   On Anticoagulation;  No bleeding issues   With his bradycardia, and the indication for his beta-blocker being coronary artery disease with prior MI but interval assessments with normal LV function we will discontinue his metoprolol.  He took his magnesium to "help with heartbeat "we discussed that his A. fib is permanent.  He will continue it or not as he desires. I worry a little bit about the episodes of lightheadedness associate with shortness of breath when he is pushing his garbage can.  Not sure whether it is related to fast and or slow heartbeats.  Hopefully will be mitigated by the discontinuation of his metoprolol.  I have asked him to use his watch to try to help understand what the heart rate is, discussing in addition that the sensor will likely underestimate his heart rhythm  Without symptoms of ischemia  LDL is 49; although his atorvastatin doses are low, he is at target.  We will not adjust the dose.      Signed, Virl Axe, MD  11/30/2019 9:15 AM     [facility] [address] [place] [address] [phone] (office) [phone] (fax)

## 2020-03-21 ENCOUNTER — Other Ambulatory Visit: Payer: Self-pay | Admitting: Internal Medicine

## 2020-03-21 DIAGNOSIS — Z8673 Personal history of transient ischemic attack (TIA), and cerebral infarction without residual deficits: Secondary | ICD-10-CM

## 2020-03-21 DIAGNOSIS — I480 Paroxysmal atrial fibrillation: Secondary | ICD-10-CM

## 2020-03-21 NOTE — Telephone Encounter (Signed)
Prescription refill request for Eliquis received. Indication: a fib Last office visit: 11/30/19 Scr: 0.9 Age: 65 Weight: 76.5kg

## 2020-05-25 ENCOUNTER — Telehealth: Payer: Self-pay | Admitting: Internal Medicine

## 2020-05-25 DIAGNOSIS — Z8673 Personal history of transient ischemic attack (TIA), and cerebral infarction without residual deficits: Secondary | ICD-10-CM

## 2020-05-25 DIAGNOSIS — I480 Paroxysmal atrial fibrillation: Secondary | ICD-10-CM

## 2020-05-25 MED ORDER — APIXABAN 5 MG PO TABS: 5.0000 mg | ORAL_TABLET | Freq: Two times a day (BID) | ORAL | 1 refills | Status: DC

## 2020-05-25 NOTE — Addendum Note (Signed)
Addended by: Johny Shock B on: 05/25/2020 04:23 PM   Modules accepted: Orders

## 2020-05-25 NOTE — Telephone Encounter (Signed)
Prescription refill request for Eliquis received. Indication: TIA, afib Last office visit: 11/30/2019, [name] Scr: 0.9, 08/31/2019 Age: 65 yo  Weight: 76.5 kg   Pt is on the correct dose of Eliquis per dosing criteria, prescription refill sent for Eliquis 5mg  BID.

## 2020-05-25 NOTE — Telephone Encounter (Signed)
  *  STAT* If patient is at the pharmacy, call can be transferred to refill team.   1. Which medications need to be refilled? (please list name of each medication and dose if known) ELIQUIS 5 MG TABS tablet  2. Which pharmacy/location (including street and city if local pharmacy) is medication to be sent to? [facility] mail order pharmacy  3. Do they need a 30 day or 90 day supply? 90 days

## 2020-10-22 ENCOUNTER — Other Ambulatory Visit: Payer: Self-pay | Admitting: Internal Medicine

## 2020-10-22 DIAGNOSIS — Z8673 Personal history of transient ischemic attack (TIA), and cerebral infarction without residual deficits: Secondary | ICD-10-CM

## 2020-10-22 DIAGNOSIS — I480 Paroxysmal atrial fibrillation: Secondary | ICD-10-CM

## 2020-10-24 NOTE — Telephone Encounter (Addendum)
Eliquis '5mg'$  refill request received. Patient is 65 years old, weight-76.5kg, Crea-0.90 on 08/31/19 via [facility] from [place], Diagnosis-Afib & CVA, and last seen by Dr. [name] on 11/30/19 & pending appt on 11/29/2020. Dose is appropriate based on dosing criteria.   Will need to call PCP office to see if any recent labs. Spoke with PCP office and they have CBC but no BMET/CMET. CBC-Hemoglobin 15.5 & HCT-45.6. Will place BMET lab order when pt has appt with Dr. [name] on 11/29/20. Lab appt made for pt with [name] appt & note placed on MD appt, too.

## 2020-11-29 ENCOUNTER — Encounter: Payer: Self-pay | Admitting: Internal Medicine

## 2020-11-29 ENCOUNTER — Ambulatory Visit: Payer: Medicare PPO | Admitting: Internal Medicine

## 2020-11-29 ENCOUNTER — Other Ambulatory Visit: Payer: Medicare PPO

## 2020-11-29 ENCOUNTER — Other Ambulatory Visit: Payer: Self-pay

## 2020-11-29 VITALS — BP 112/88 | HR 82 | Ht 65.0 in | Wt 165.0 lb

## 2020-11-29 DIAGNOSIS — Z79899 Other long term (current) drug therapy: Secondary | ICD-10-CM | POA: Diagnosis not present

## 2020-11-29 DIAGNOSIS — I4821 Permanent atrial fibrillation: Secondary | ICD-10-CM | POA: Insufficient documentation

## 2020-11-29 DIAGNOSIS — I4510 Unspecified right bundle-branch block: Secondary | ICD-10-CM

## 2020-11-29 DIAGNOSIS — Z8673 Personal history of transient ischemic attack (TIA), and cerebral infarction without residual deficits: Secondary | ICD-10-CM | POA: Diagnosis not present

## 2020-11-29 NOTE — Progress Notes (Signed)
Electrophysiology Office Note   Date:  11/29/2020   ID:  Casey Radon., DOB 1955/12/12, MRN [id number]  PCP:  Lavone Orn, MD  Cardiologist:  Primary Electrophysiologist:    Madelin Rear      History of Present Illness: Casey Kieran. is a 65 y.o. male  seen in follow-up for a stroke and telemetry demonstrated atrial fibrillation. He was started on apixaban. He also has a history of dyslipidemia. Coronary artery disease with remote stenting by Dr. Tamala Julian. He had residual nonobstructive disease.  Underwent cardioversion last 2017-protracted atrial fibrillation-successful but within 3 weeks he had reverted to atrial fibrillation.  He was classified PERMANENT afib.     Today he is feeling well overall.  For activity he continues to play golf 1-2 times a week, mows the lawn and tries to stay busy. He denies any exertional symptoms.    Recently he developed right hip pain with a burning sensation. He began taking a tumeric supplement which seems to help him.    Today the patient denies chest pain, shortness of breath, nocturnal dyspnea, orthopnea or peripheral edema.  There have been no palpitations, lightheadedness or syncope.  A few times, his watch noted irregular heartbeats after he over-exerted playing golf, but he did not feel this. Yesterday, his average heart rate was 64 bpm.  DATE TEST EF   6/18    Echo   EF 55-60 %   7/18    Myoview   EF   % No ischemia          Date Cr K Hgb LDL   6/18 0.85 4.3 15.7   3/19 0.92  16.3   6/20 0.85  15.9   6/21 0.9 4.9 15.5 49           Thromboembolic risk factors ( age -37 , TIA/CVA-2, Vasc disease -1) for a CHADSVASc Score >=4     Past Medical History:  Diagnosis Date   Coronary artery disease    NSTEMI 12/1998 - LHC:  prox 85-90, RCA prox 30-50, EF 65-70 >> [id number] and BMS to LAD; large Dx jailed by stent (90%), EF 60  // Myoview 7/18: normal perfusion, Low Risk   History of echocardiogram    Echo 7/18: mild  LVH, EF 60-65, no RWMA, mild MR, mod LAE   Myocardial infarction [facility])    Stroke ([place]) 04/2014   Past Surgical History:  Procedure Laterality Date   CARDIOVERSION N/A 06/20/2015   Procedure: CARDIOVERSION;  Surgeon: Sanda Taccara Bushnell, MD;  Location: [facility];  Service: Cardiovascular;  Laterality: N/A;   COLONOSCOPY WITH PROPOFOL N/A 02/07/2015   Procedure: COLONOSCOPY WITH PROPOFOL;  Surgeon: [name], MD;  Location: [facility];  Service: Endoscopy;  Laterality: N/A;   CORONARY STENT PLACEMENT  2001   KNEE SURGERY  1972    Current Outpatient Medications  Medication Sig Dispense Refill   atorvastatin (LIPITOR) 10 MG tablet Take 10 mg by mouth daily.     ELIQUIS 5 MG TABS tablet TAKE 1 TABLET TWICE DAILY 180 tablet 0   Magnesium 100 MG CAPS Take 1 capsule by mouth daily.     Multiple Vitamins-Minerals (MULTIVITAMIN ADULT PO) Take 1 tablet by mouth 2 (two) times daily.     nitroGLYCERIN (NITROSTAT) 0.4 MG SL tablet Place 1 tablet (0.4 mg total) under the tongue every 5 (five) minutes as needed for chest pain. 25 tablet 3   Omega-3 Fatty Acids (FISH OIL PO) Take 1 tablet by mouth  2 (two) times daily.      TURMERIC PO Take 115 mg by mouth daily.     No current facility-administered medications for this visit.    Allergies:   Patient has no known allergies.    ROS:  Please see the history of present illness.   Otherwise, review of systems is negative .    PHYSICAL EXAM: BP 112/88   Pulse 82   Ht '5\' 5"'$  (1.651 m)   Wt 165 lb (74.8 kg)   SpO2 98%   BMI 27.46 kg/m  Well developed and nourished in no acute distress HENT normal Neck supple with JVP-  flat  Clear Irregularly irregular, no murmurs or gallops Abd-soft with active BS No Clubbing cyanosis edema Skin-warm and dry A & Oriented  Grossly normal sensory and motor function  ECG Afib @ 82 -/12/36     Lipid Panel     Component Value Date/Time   CHOL 168 05/04/2014 0110   TRIG 362 (H) 05/04/2014 0110   HDL  30 (L) 05/04/2014 0110   CHOLHDL 5.6 05/04/2014 0110   VLDL 72 (H) 05/04/2014 0110   LDLCALC 66 05/04/2014 0110   Wt Readings from Last 3 Encounters:  11/29/20 165 lb (74.8 kg)  11/30/19 168 lb 9.6 oz (76.5 kg)  11/10/18 171 lb (77.6 kg)    ASSESSMENT AND PLAN:  Atrial fibrillation-permanent  RBBB  Stroke with right hemiparesis and aphasia-marked resolution   Ischemic heart disease with prior infarction and prior stenting  Bradycardia  Statin low dose    AFib permanent, continue Apixoban '5mg'$  bid  no bleeding  No angina, continue atorva 10 mg,. LDL at goal (< 70)  Continue statin also for prior stroke and secondary prevention      I,Mathew Stumpf,acting as a scribe for Virl Axe, MD.,have documented all relevant documentation on the behalf of Virl Axe, MD,as directed by  Virl Axe, MD while in the presence of Virl Axe, MD.  I, Virl Axe, MD, have reviewed all documentation for this visit. The documentation on 11/29/20 for the exam, diagnosis, procedures, and orders are all accurate and complete.    Waynetta Pean  11/29/2020 3:01 PM     [address] [place] [address] [phone] (office) [phone] (fax)

## 2020-11-29 NOTE — Progress Notes (Deleted)
Error

## 2020-11-29 NOTE — Progress Notes (Signed)
Error

## 2020-11-29 NOTE — Patient Instructions (Signed)

## 2020-12-05 DIAGNOSIS — I4891 Unspecified atrial fibrillation: Secondary | ICD-10-CM | POA: Diagnosis not present

## 2020-12-05 DIAGNOSIS — Z8249 Family history of ischemic heart disease and other diseases of the circulatory system: Secondary | ICD-10-CM | POA: Diagnosis not present

## 2020-12-05 DIAGNOSIS — Z85818 Personal history of malignant neoplasm of other sites of lip, oral cavity, and pharynx: Secondary | ICD-10-CM | POA: Diagnosis not present

## 2020-12-05 DIAGNOSIS — I2510 Atherosclerotic heart disease of native coronary artery without angina pectoris: Secondary | ICD-10-CM | POA: Diagnosis not present

## 2020-12-05 DIAGNOSIS — D6869 Other thrombophilia: Secondary | ICD-10-CM | POA: Diagnosis not present

## 2020-12-05 DIAGNOSIS — R030 Elevated blood-pressure reading, without diagnosis of hypertension: Secondary | ICD-10-CM | POA: Diagnosis not present

## 2020-12-05 DIAGNOSIS — E785 Hyperlipidemia, unspecified: Secondary | ICD-10-CM | POA: Diagnosis not present

## 2020-12-05 DIAGNOSIS — Z7901 Long term (current) use of anticoagulants: Secondary | ICD-10-CM | POA: Diagnosis not present

## 2020-12-05 DIAGNOSIS — I252 Old myocardial infarction: Secondary | ICD-10-CM | POA: Diagnosis not present

## 2021-03-02 ENCOUNTER — Other Ambulatory Visit: Payer: Self-pay | Admitting: Internal Medicine

## 2021-03-02 ENCOUNTER — Other Ambulatory Visit (HOSPITAL_COMMUNITY): Payer: Self-pay | Admitting: Internal Medicine

## 2021-03-02 DIAGNOSIS — R17 Unspecified jaundice: Secondary | ICD-10-CM

## 2021-03-03 ENCOUNTER — Other Ambulatory Visit: Payer: Self-pay | Admitting: Internal Medicine

## 2021-03-03 ENCOUNTER — Ambulatory Visit (HOSPITAL_COMMUNITY)
Admission: RE | Admit: 2021-03-03 | Discharge: 2021-03-03 | Disposition: A | Discharge status: Home / Self Care | Payer: Medicare PPO | Source: Ambulatory Visit | Attending: Internal Medicine | Admitting: Internal Medicine

## 2021-03-03 DIAGNOSIS — R17 Unspecified jaundice: Secondary | ICD-10-CM | POA: Diagnosis not present

## 2021-03-03 DIAGNOSIS — K831 Obstruction of bile duct: Secondary | ICD-10-CM

## 2021-03-15 ENCOUNTER — Ambulatory Visit
Admission: RE | Admit: 2021-03-15 | Discharge: 2021-03-15 | Disposition: A | Discharge status: Home / Self Care | Payer: Medicare PPO | Source: Ambulatory Visit | Attending: Internal Medicine | Admitting: Internal Medicine

## 2021-03-15 ENCOUNTER — Other Ambulatory Visit: Payer: Self-pay

## 2021-03-15 DIAGNOSIS — K8689 Other specified diseases of pancreas: Secondary | ICD-10-CM | POA: Diagnosis not present

## 2021-03-15 DIAGNOSIS — K831 Obstruction of bile duct: Secondary | ICD-10-CM

## 2021-03-15 DIAGNOSIS — R748 Abnormal levels of other serum enzymes: Secondary | ICD-10-CM | POA: Diagnosis not present

## 2021-03-15 DIAGNOSIS — K828 Other specified diseases of gallbladder: Secondary | ICD-10-CM | POA: Diagnosis not present

## 2021-03-15 MED ORDER — GADOBENATE DIMEGLUMINE 529 MG/ML IV SOLN
15.0000 mL | Freq: Once | INTRAVENOUS | Status: AC | PRN
  Administered 2021-03-15: 16:00:00 15 mL via INTRAVENOUS

## 2021-03-22 ENCOUNTER — Other Ambulatory Visit: Payer: Self-pay | Admitting: Gastroenterology

## 2021-03-22 DIAGNOSIS — K831 Obstruction of bile duct: Secondary | ICD-10-CM | POA: Diagnosis not present

## 2021-03-22 DIAGNOSIS — K219 Gastro-esophageal reflux disease without esophagitis: Secondary | ICD-10-CM | POA: Diagnosis not present

## 2021-03-22 DIAGNOSIS — R197 Diarrhea, unspecified: Secondary | ICD-10-CM | POA: Diagnosis not present

## 2021-03-24 ENCOUNTER — Telehealth: Payer: Self-pay | Admitting: Internal Medicine

## 2021-03-24 NOTE — Telephone Encounter (Signed)
° °  Pre-operative Risk Assessment    Patient Name: Casey Pierce.  DOB: April 26, 1955 MRN: [id number]      Request for Surgical Clearance    Procedure:  EUS and ERCP  Date of Surgery:  Clearance 03/28/21                                 Surgeon:  Dr. Harley Alto and Dr. Paulita Fujita Surgeon's Group or Practice Name:  [facility] Phone number:  [phone] Fax number:  [phone]   Type of Clearance Requested:   - Medical  - Pharmacy:  Hold Apixaban (Eliquis) 1  day   Type of Anesthesia:  propofol   Additional requests/questions:    Signed, Selena Zobro   03/24/2021, 8:21 AM

## 2021-03-24 NOTE — Telephone Encounter (Signed)
Patient with diagnosis of A Fib on Eliquis for anticoagulation.    Procedure:  EUS and ERCP Date of procedure:  03/28/21                                CHA2DS2-VASc Score = 4  This indicates a 4.8% annual risk of stroke. The patient's score is based upon: CHF History: 0 HTN History: 0 Diabetes History: 0 Stroke History: 2 Vascular Disease History: 1 Age Score: 1 Gender Score: 0  CrCl 87 mL/min Platelet count unknown   Per office protocol, patient can hold Eliquis for 1 days prior to procedure.

## 2021-03-24 NOTE — Telephone Encounter (Signed)
° °  Primary Cardiologist: Virl Axe, MD  Chart reviewed as part of pre-operative protocol coverage. Given past medical history and time since last visit, based on ACC/AHA guidelines, Casey Pierce. would be at acceptable risk for the planned procedure without further cardiovascular testing.   His RCRI is a class III risk, 6.6% risk of major cardiac event.  He is able to complete greater than 4 METS of physical activity.  Patient with diagnosis of A Fib on Eliquis for anticoagulation.     Procedure:  EUS and ERCP Date of procedure:  03/28/21                                  CHA2DS2-VASc Score = 4  This indicates a 4.8% annual risk of stroke. The patient's score is based upon: CHF History: 0 HTN History: 0 Diabetes History: 0 Stroke History: 2 Vascular Disease History: 1 Age Score: 1 Gender Score: 0   CrCl 87 mL/min Platelet count unknown     Per office protocol, patient can hold Eliquis for 1 days prior to procedure.  Patient was advised that if he develops new symptoms prior to surgery to contact our office to arrange a follow-up appointment.  He verbalized understanding.  I will route this recommendation to the requesting party via Epic fax function and remove from pre-op pool.  Please call with questions.  [name]. [name] NP-C    03/24/2021, 9:28 AM [address] Office [phone] Fax [phone]

## 2021-03-25 ENCOUNTER — Other Ambulatory Visit: Payer: Medicare PPO

## 2021-03-27 NOTE — Anesthesia Preprocedure Evaluation (Addendum)
Anesthesia Evaluation  Patient identified by MRN, date of birth, ID band Patient awake    Reviewed: Allergy & Precautions, NPO status , Patient's Chart, lab work & pertinent test results  Airway Mallampati: III  TM Distance: >3 FB Neck ROM: Full    Dental  (+) Dental Advisory Given, Chipped,    Pulmonary neg pulmonary ROS,    Pulmonary exam normal breath sounds clear to auscultation       Cardiovascular (-) angina+ CAD, + Past MI and + Cardiac Stents (2001)  Normal cardiovascular exam+ dysrhythmias (eliquis- last dose: 3d ago) Atrial Fibrillation + Valvular Problems/Murmurs (mild MR) MR  Rhythm:Regular Rate:Normal  Echo 2018: - Left ventricle: The cavity size was normal. Wall thickness was  increased in a pattern of mild LVH. Systolic function was normal.  The estimated ejection fraction was in the range of 60% to 65%.  Wall motion was normal; there were no regional wall motion  abnormalities.  - Mitral valve: There was mild regurgitation.  - Left atrium: The atrium was moderately dilated.    Neuro/Psych CVA (2016) negative psych ROS   GI/Hepatic Neg liver ROS, Obstructive jaundice    Endo/Other  negative endocrine ROS  Renal/GU negative Renal ROS  negative genitourinary   Musculoskeletal negative musculoskeletal ROS (+)   Abdominal   Peds  Hematology negative hematology ROS (+)   Anesthesia Other Findings   Reproductive/Obstetrics negative OB ROS                            Anesthesia Physical Anesthesia Plan  ASA: 3  Anesthesia Plan: General   Post-op Pain Management:    Induction: Intravenous  PONV Risk Score and Plan: 2 and Ondansetron, Dexamethasone, Midazolam and Treatment may vary due to age or medical condition  Airway Management Planned: Oral ETT  Additional Equipment: None  Intra-op Plan:   Post-operative Plan: Extubation in OR  Informed Consent: I have  reviewed the patients History and Physical, chart, labs and discussed the procedure including the risks, benefits and alternatives for the proposed anesthesia with the patient or authorized representative who has indicated his/her understanding and acceptance.     Dental advisory given  Plan Discussed with: CRNA  Anesthesia Plan Comments:        Anesthesia Quick Evaluation

## 2021-03-28 ENCOUNTER — Ambulatory Visit (HOSPITAL_COMMUNITY): Payer: Medicare PPO | Admitting: Anesthesiology

## 2021-03-28 ENCOUNTER — Encounter (HOSPITAL_COMMUNITY): Payer: Self-pay | Admitting: Gastroenterology

## 2021-03-28 ENCOUNTER — Ambulatory Visit (HOSPITAL_COMMUNITY)
Admission: RE | Admit: 2021-03-28 | Discharge: 2021-03-28 | Disposition: A | Discharge status: Home / Self Care | Payer: Medicare PPO | Attending: Gastroenterology | Admitting: Gastroenterology

## 2021-03-28 ENCOUNTER — Other Ambulatory Visit: Payer: Self-pay

## 2021-03-28 ENCOUNTER — Encounter (HOSPITAL_COMMUNITY)
Admission: RE | Disposition: A | Discharge status: Home / Self Care | Payer: Self-pay | Source: Home / Self Care | Attending: Gastroenterology

## 2021-03-28 ENCOUNTER — Ambulatory Visit (HOSPITAL_COMMUNITY): Payer: Medicare PPO

## 2021-03-28 DIAGNOSIS — I2510 Atherosclerotic heart disease of native coronary artery without angina pectoris: Secondary | ICD-10-CM | POA: Diagnosis not present

## 2021-03-28 DIAGNOSIS — Z0389 Encounter for observation for other suspected diseases and conditions ruled out: Secondary | ICD-10-CM | POA: Diagnosis not present

## 2021-03-28 DIAGNOSIS — K831 Obstruction of bile duct: Secondary | ICD-10-CM | POA: Diagnosis not present

## 2021-03-28 DIAGNOSIS — R17 Unspecified jaundice: Secondary | ICD-10-CM | POA: Diagnosis not present

## 2021-03-28 DIAGNOSIS — R932 Abnormal findings on diagnostic imaging of liver and biliary tract: Secondary | ICD-10-CM | POA: Diagnosis not present

## 2021-03-28 DIAGNOSIS — I340 Nonrheumatic mitral (valve) insufficiency: Secondary | ICD-10-CM | POA: Diagnosis not present

## 2021-03-28 DIAGNOSIS — I899 Noninfective disorder of lymphatic vessels and lymph nodes, unspecified: Secondary | ICD-10-CM | POA: Diagnosis not present

## 2021-03-28 DIAGNOSIS — I4891 Unspecified atrial fibrillation: Secondary | ICD-10-CM | POA: Diagnosis not present

## 2021-03-28 DIAGNOSIS — K838 Other specified diseases of biliary tract: Secondary | ICD-10-CM | POA: Diagnosis not present

## 2021-03-28 DIAGNOSIS — R748 Abnormal levels of other serum enzymes: Secondary | ICD-10-CM | POA: Diagnosis not present

## 2021-03-28 DIAGNOSIS — C250 Malignant neoplasm of head of pancreas: Secondary | ICD-10-CM | POA: Diagnosis not present

## 2021-03-28 DIAGNOSIS — R52 Pain, unspecified: Secondary | ICD-10-CM

## 2021-03-28 DIAGNOSIS — I252 Old myocardial infarction: Secondary | ICD-10-CM | POA: Diagnosis not present

## 2021-03-28 DIAGNOSIS — Z7901 Long term (current) use of anticoagulants: Secondary | ICD-10-CM | POA: Diagnosis not present

## 2021-03-28 DIAGNOSIS — R933 Abnormal findings on diagnostic imaging of other parts of digestive tract: Secondary | ICD-10-CM | POA: Diagnosis not present

## 2021-03-28 DIAGNOSIS — Z8673 Personal history of transient ischemic attack (TIA), and cerebral infarction without residual deficits: Secondary | ICD-10-CM | POA: Insufficient documentation

## 2021-03-28 DIAGNOSIS — K8689 Other specified diseases of pancreas: Secondary | ICD-10-CM | POA: Diagnosis not present

## 2021-03-28 HISTORY — PX: ERCP: SHX5425

## 2021-03-28 HISTORY — PX: SPHINCTEROTOMY: SHX5279

## 2021-03-28 HISTORY — PX: BILIARY STENT PLACEMENT: SHX5538

## 2021-03-28 HISTORY — PX: EUS: SHX5427

## 2021-03-28 HISTORY — PX: FINE NEEDLE ASPIRATION: SHX5430

## 2021-03-28 HISTORY — PX: ESOPHAGOGASTRODUODENOSCOPY: SHX5428

## 2021-03-28 SURGERY — ERCP, WITH INTERVENTION IF INDICATED
Anesthesia: General

## 2021-03-28 MED ORDER — CIPROFLOXACIN IN D5W 400 MG/200ML IV SOLN
INTRAVENOUS | Status: AC
  Filled 2021-03-28: qty 200

## 2021-03-28 MED ORDER — ROCURONIUM BROMIDE 100 MG/10ML IV SOLN
INTRAVENOUS | Status: DC | PRN
  Administered 2021-03-28: 60 mg via INTRAVENOUS

## 2021-03-28 MED ORDER — LIDOCAINE HCL (CARDIAC) PF 100 MG/5ML IV SOSY
PREFILLED_SYRINGE | INTRAVENOUS | Status: DC | PRN
  Administered 2021-03-28: 60 mg via INTRAVENOUS

## 2021-03-28 MED ORDER — PHENYLEPHRINE HCL (PRESSORS) 10 MG/ML IV SOLN
INTRAVENOUS | Status: DC | PRN
  Administered 2021-03-28 (×4): 80 ug via INTRAVENOUS

## 2021-03-28 MED ORDER — PHENYLEPHRINE HCL (PRESSORS) 10 MG/ML IV SOLN
INTRAVENOUS | Status: AC
  Filled 2021-03-28: qty 1

## 2021-03-28 MED ORDER — FENTANYL CITRATE (PF) 100 MCG/2ML IJ SOLN
INTRAMUSCULAR | Status: DC | PRN
Start: 2021-03-28 — End: 2021-03-28
  Administered 2021-03-28: 100 ug via INTRAVENOUS

## 2021-03-28 MED ORDER — INDOMETHACIN 50 MG RE SUPP
RECTAL | Status: DC | PRN
  Administered 2021-03-28: 100 mg via RECTAL

## 2021-03-28 MED ORDER — GLUCAGON HCL RDNA (DIAGNOSTIC) 1 MG IJ SOLR
INTRAMUSCULAR | Status: AC
  Filled 2021-03-28: qty 2

## 2021-03-28 MED ORDER — LACTATED RINGERS IV SOLN
INTRAVENOUS | Status: AC | PRN
  Administered 2021-03-28: 10 mL/h via INTRAVENOUS

## 2021-03-28 MED ORDER — SUGAMMADEX SODIUM 200 MG/2ML IV SOLN
INTRAVENOUS | Status: DC | PRN
  Administered 2021-03-28: 200 mg via INTRAVENOUS

## 2021-03-28 MED ORDER — CIPROFLOXACIN IN D5W 400 MG/200ML IV SOLN
INTRAVENOUS | Status: DC | PRN
Start: 2021-03-28 — End: 2021-03-28
  Administered 2021-03-28: 400 mg via INTRAVENOUS

## 2021-03-28 MED ORDER — ONDANSETRON HCL 4 MG/2ML IJ SOLN
INTRAMUSCULAR | Status: DC | PRN
  Administered 2021-03-28: 4 mg via INTRAVENOUS

## 2021-03-28 MED ORDER — INDOMETHACIN 50 MG RE SUPP
RECTAL | Status: AC
  Filled 2021-03-28: qty 2

## 2021-03-28 MED ORDER — FENTANYL CITRATE (PF) 100 MCG/2ML IJ SOLN
INTRAMUSCULAR | Status: AC
  Filled 2021-03-28: qty 2

## 2021-03-28 MED ORDER — SODIUM CHLORIDE 0.9 % IV SOLN
INTRAVENOUS | Status: DC | PRN
  Administered 2021-03-28: 15 mL

## 2021-03-28 MED ORDER — SODIUM CHLORIDE 0.9 % IV SOLN: INTRAVENOUS | Status: DC

## 2021-03-28 MED ORDER — PROPOFOL 10 MG/ML IV BOLUS
INTRAVENOUS | Status: DC | PRN
  Administered 2021-03-28: 150 mg via INTRAVENOUS

## 2021-03-28 MED ORDER — PHENYLEPHRINE HCL-NACL 20-0.9 MG/250ML-% IV SOLN
INTRAVENOUS | Status: DC | PRN
  Administered 2021-03-28: 25 ug/min via INTRAVENOUS

## 2021-03-28 MED ORDER — PROPOFOL 10 MG/ML IV BOLUS
INTRAVENOUS | Status: AC
  Filled 2021-03-28: qty 20

## 2021-03-28 NOTE — Anesthesia Postprocedure Evaluation (Signed)
Anesthesia Post Note  Patient: Casey Pierce.  Procedure(s) Performed: ENDOSCOPIC RETROGRADE CHOLANGIOPANCREATOGRAPHY (ERCP) FINE NEEDLE ASPIRATION (FNA) LINEAR SPHINCTEROTOMY BILIARY STENT PLACEMENT FULL UPPER ENDOSCOPIC ULTRASOUND (EUS) RADIAL     Patient location during evaluation: PACU Anesthesia Type: General Level of consciousness: awake and alert, oriented and patient cooperative Pain management: pain level controlled Vital Signs Assessment: post-procedure vital signs reviewed and stable Respiratory status: spontaneous breathing, nonlabored ventilation and respiratory function stable Cardiovascular status: blood pressure returned to baseline and stable Postop Assessment: no apparent nausea or vomiting Anesthetic complications: no   No notable events documented.  Last Vitals:  Vitals:   03/28/21 1240 03/28/21 1250  BP: 111/62 111/73  Pulse: 78 84  Resp: (!) 27 15  Temp:    SpO2: 99% 98%    Last Pain:  Vitals:   03/28/21 1250  TempSrc:   PainSc: 0-No pain                 Pervis Hocking

## 2021-03-28 NOTE — Anesthesia Procedure Notes (Signed)
Procedure Name: Intubation Date/Time: 03/28/2021 10:58 AM Performed by: [place] Eloina Ergle C, CRNA Pre-anesthesia Checklist: Patient identified, Emergency Drugs available, Suction available and Patient being monitored Patient Re-evaluated:Patient Re-evaluated prior to induction Oxygen Delivery Method: Circle system utilized Preoxygenation: Pre-oxygenation with 100% oxygen Induction Type: IV induction Ventilation: Mask ventilation without difficulty Laryngoscope Size: Mac and 4 Grade View: Grade I Tube type: Oral Tube size: 7.5 mm Number of attempts: 1 Airway Equipment and Method: Stylet and Oral airway Placement Confirmation: ETT inserted through vocal cords under direct vision, positive ETCO2 and breath sounds checked- equal and bilateral Tube secured with: Tape Dental Injury: Teeth and Oropharynx as per pre-operative assessment

## 2021-03-28 NOTE — Op Note (Signed)
[facility] Patient Name: Casey Pierce Procedure Date: 03/28/2021 MRN: [id number] Attending MD: Clarene Essex , MD Date of Birth: November 04, 1955 CSN: [id number] Age: 66 Admit Type: Outpatient Procedure:                ERCP Indications:              Abnormal MRCP, Jaundice, Malignant tumor of the                            head of pancreas Providers:                Burtis Junes, RN, Frazier Richards, Technician, Clarene Essex, MD, Doristine Johns, RN Referring MD:              Medicines:                General Anesthesia Complications:            No immediate complications. Estimated Blood Loss:     Estimated blood loss: none. Procedure:                Pre-Anesthesia Assessment:                           - Prior to the procedure, a History and Physical                            was performed, and patient medications and                            allergies were reviewed. The patient's tolerance of                            previous anesthesia was also reviewed. The risks                            and benefits of the procedure and the sedation                            options and risks were discussed with the patient.                            All questions were answered, and informed consent                            was obtained. Prior Anticoagulants: The patient has                            taken Eliquis (apixaban), last dose was 2 days                            prior to procedure. ASA Grade Assessment: III - A  patient with severe systemic disease. After                            reviewing the risks and benefits, the patient was                            deemed in satisfactory condition to undergo the                            procedure.                           - Prior to the procedure, a History and Physical                            was performed, and patient medications and                            allergies  were reviewed. The patient's tolerance of                            previous anesthesia was also reviewed. The risks                            and benefits of the procedure and the sedation                            options and risks were discussed with the patient.                            All questions were answered, and informed consent                            was obtained. Prior Anticoagulants: The patient has                            taken Eliquis (apixaban), last dose was 3 days                            prior to procedure. ASA Grade Assessment: II - A                            patient with mild systemic disease. After reviewing                            the risks and benefits, the patient was deemed in                            satisfactory condition to undergo the procedure.                           After obtaining informed consent, the scope was  passed under direct vision. Throughout the                            procedure, the patient's blood pressure, pulse, and                            oxygen saturations were monitored continuously. The                            [id number] ([id number] Olympus duodenoscope was                            introduced through the mouth, and used to inject                            contrast into and used to cannulate the bile duct.                            The ERCP was somewhat difficult due to challenging                            cannulation because of abnormal anatomy. Successful                            completion of the procedure was aided by performing                            the maneuvers documented (below) in this report.                            The patient tolerated the procedure well. Scope In: Scope Out: Findings:      The major papilla was normal. We tried to cannulate with the       sphincterotome loaded with the Jagwire but were initially unsuccessful       and the sphincterotome  was in adequate position engaged in the ampulla       so we proceeded with a biliary pre-cut sphincterotomy which was made       with a Hydratome sphincterotome using ERBE electrocautery. There was no       post-sphincterotomy bleeding. After a short sphincterotomy deep       selective cannulation was readily obtained and dye was injected and a       long distal stricture was confirmed as well as the dilated duct and the       biliary sphincterotomy was extended with a Hydratome sphincterotome       using ERBE electrocautery until we had a little biliary drainage and       could get the half bowed sphincterotome in and out of the duct. There       was no post-sphincterotomy bleeding. We then placed one 10 Fr by 6 cm       uncovered metal stent with no external flaps and no internal flaps was       placed 5.5 cm into the common bile duct. Sludge flowed through the       stent. The stent was in good position.  The wire introducer and scope       were removed and the patient tolerated the procedure well and there was       no pancreatic duct injection or wire advancement throughout the procedure Impression:               - The major papilla appeared normal.                           - A precut biliary sphincterotomy was performed.                           - A biliary sphincterotomy extension was performed.                           - One uncovered metal stent was placed into the                            common bile duct. Moderate Sedation:      Not Applicable - Patient had care per Anesthesia. Recommendation:           - Clear liquid diet for 6 hours. If doing well this                            evening may have soft solids                           - Continue present medications.                           - Resume Eliquis (apixaban) at prior dose in 3 days.                           - Return to GI clinic PRN.                           - Telephone GI clinic for pathology results in 5                             days.                           - Telephone GI clinic if symptomatic PRN.                           - Refer to a surgeon at appointment to be scheduled.                           - Refer to an oncologist at appointment to be                            scheduled.                           - Check liver enzymes (AST, ALT, alkaline  phosphatase, bilirubin) in 7 days. Procedure Code(s):        --- Professional ---                           [phone], Endoscopic retrograde                            cholangiopancreatography (ERCP); with placement of                            endoscopic stent into biliary or pancreatic duct,                            including pre- and post-dilation and guide wire                            passage, when performed, including sphincterotomy,                            when performed, each stent Diagnosis Code(s):        --- Professional ---                           R17, Unspecified jaundice                           C25.0, Malignant neoplasm of head of pancreas                           R93.2, Abnormal findings on diagnostic imaging of                            liver and biliary tract CPT copyright 2019 American Medical Association. All rights reserved. The codes documented in this report are preliminary and upon coder review may  be revised to meet current compliance requirements. Clarene Essex, MD 03/28/2021 12:54:28 PM This report has been signed electronically. Number of Addenda: 0

## 2021-03-28 NOTE — H&P (Signed)
[facility] H/P Note  Chief Complaint: obstructive jaundice  HPI: Casey Pierce. is an 66 y.o. male.  Jaundice x 2 weeks.  No pain or weight loss.  + dark urine, + light stools.  Imaging showed pancreatic head mass with intra- and extrahepatic biliary ductal dilatation.  Past Medical History:  Diagnosis Date   Coronary artery disease    NSTEMI 12/1998 - LHC:  prox 85-90, RCA prox 30-50, EF 65-70 >> MIW:OEHO and BMS to LAD; large Dx jailed by stent (90%), EF 60  // Myoview 7/18: normal perfusion, Low Risk   History of echocardiogram    Echo 7/18: mild LVH, EF 60-65, no RWMA, mild MR, mod LAE   Myocardial infarction [facility])    Stroke ([place]) 04/2014    Past Surgical History:  Procedure Laterality Date   CARDIOVERSION N/A 06/20/2015   Procedure: CARDIOVERSION;  Surgeon: [name], MD;  Location: [place];  Service: Cardiovascular;  Laterality: N/A;   COLONOSCOPY WITH PROPOFOL N/A 02/07/2015   Procedure: COLONOSCOPY WITH PROPOFOL;  Surgeon: [name], MD;  Location: [facility];  Service: Endoscopy;  Laterality: N/A;   CORONARY STENT PLACEMENT  2001   KNEE SURGERY  1972    Medications Prior to Admission  Medication Sig Dispense Refill   ELIQUIS 5 MG TABS tablet TAKE 1 TABLET TWICE DAILY 180 tablet 0    Allergies: No Known Allergies  Family History  Problem Relation Age of Onset   Cancer Father    Heart attack Maternal Uncle     Social History:  reports that he has never smoked. He quit smokeless tobacco use about 22 years ago.  His smokeless tobacco use included chew. He reports that he does not drink alcohol and does not use drugs.   ROS: As per HPI, all others negative   Blood pressure 126/83, pulse 91, temperature 97.8 F (36.6 C), temperature source Temporal, resp. rate 15, height 5\' 5"  (1.651 m), weight 73.9 kg, SpO2 100 %. General appearance: NAD, jaundiced HEENT:  Icteric ABD:  Soft, non-tender NEURO:  Non-focal CV:  Regular RESP:   CTA  No results found for this or any previous visit (from the past 48 hour(s)). No results found.  Assessment/Plan   Obstructive jaundice from pancreatic head mass. Chronic anticoagulation, cardiac stent, Eliquis (off x 2 days). EUS (Dr. Paulita Fujita) + ERCP (Dr. Watt Climes). Risks (bleeding, infection, bowel perforation that could require surgery, sedation-related changes in cardiopulmonary systems), benefits (identification and possible treatment of source of symptoms, exclusion of certain causes of symptoms), and alternatives (watchful waiting, radiographic imaging studies, empiric medical treatment) of upper endoscopy with ultrasound and possible fine needle aspiration biopsies (EUS +/- FNA) were explained to patient/family in detail and patient wishes to proceed.  Risks (up to and including bleeding, infection, perforation, pancreatitis that can be complicated by infected necrosis and death), benefits (removal of stones, alleviating blockage, decreasing risk of cholangitis or choledocholithiasis-related pancreatitis), and alternatives (watchful waiting, percutaneous transhepatic cholangiography) of ERCP were explained to patient/family in detail and patient elects to proceed.   Casey Pierce 03/28/2021, 10:49 AM

## 2021-03-28 NOTE — Op Note (Signed)
[facility] Patient Name: Casey Pierce Procedure Date: 03/28/2021 MRN: [id number] Attending MD: Arta Silence , MD Date of Birth: May 07, 1955 CSN: [id number] Age: 66 Admit Type: Outpatient Procedure:                Upper EUS Indications:              Common bile duct dilation (acquired) seen on MRI,                            Suspected mass in pancreas on MRI, Elevated liver                            enzymes Providers:                Arta Silence, MD, Jeanella Cara, RN,                            Frazier Richards, Technician Referring MD:             Lavone Orn, MD Medicines:                General Anesthesia Complications:            No immediate complications. Estimated Blood Loss:     Estimated blood loss: none. Procedure:                Pre-Anesthesia Assessment:                           - Prior to the procedure, a History and Physical                            was performed, and patient medications and                            allergies were reviewed. The patient's tolerance of                            previous anesthesia was also reviewed. The risks                            and benefits of the procedure and the sedation                            options and risks were discussed with the patient.                            All questions were answered, and informed consent                            was obtained. Prior Anticoagulants: The patient has                            taken Eliquis (apixaban), last dose was 2 days  prior to procedure. ASA Grade Assessment: III - A                            patient with severe systemic disease. After                            reviewing the risks and benefits, the patient was                            deemed in satisfactory condition to undergo the                            procedure.                           After obtaining informed consent, the endoscope was                             passed under direct vision. Throughout the                            procedure, the patient's blood pressure, pulse, and                            oxygen saturations were monitored continuously. The                            [id number] ([id number] Olympus linear ultrasound scope                            was introduced through the mouth, and advanced to                            the duodenal bulb. The upper EUS was accomplished                            without difficulty. The patient tolerated the                            procedure well. Scope In: Scope Out: Findings:      ENDOSONOGRAPHIC FINDING: :      There was dilation in the common bile duct which measured up to 15 mm.      A few abnormal lymph nodes were visualized in the porta hepatis region.       The nodes were hypoechoic and had poorly defined margins.      An irregular mass was identified in the pancreatic head. The mass was       hypoechoic. The mass measured 25 mm by 20 mm in maximal cross-sectional       diameter. The endosonographic borders were poorly-defined. An intact       interface was seen between the mass and the superior mesenteric artery,       portal vein, superior mesenteric vein and splenic vein suggesting a lack       of invasion. The remainder of the pancreas was examined.  The       endosonographic appearance of parenchyma and the upstream pancreatic       duct indicated duct dilation. Fine needle aspiration for cytology was       performed. Color Doppler imaging was utilized prior to needle puncture       to confirm a lack of significant vascular structures within the needle       path. Three passes were made with the 25 gauge needle using a       transduodenal approach. A stylet was used. A cytotechnologist was       present to evaluate the adequacy of the specimen. Preliminary cytology       is suspicious for adenocarcinoma (final results are pending).      There was no sign of  significant endosonographic abnormality in the left       lobe of the liver. No focal pathology was identified. Impression:               - There was dilation in the common bile duct which                            measured up to 15 mm.                           - A few abnormal lymph nodes were visualized in the                            porta hepatis region.                           - A mass was identified in the pancreatic head.                            This was staged T3 N1 Mx by endosonographic                            criteria. Fine needle aspiration performed.                           - There was no evidence of significant pathology in                            the left lobe of the liver. Moderate Sedation:      None Recommendation:           - Perform an ERCP today.                           - Await cytology results. Procedure Code(s):        --- Professional ---                           [phone], Esophagogastroduodenoscopy, flexible,                            transoral; with transendoscopic ultrasound-guided  intramural or transmural fine needle                            aspiration/biopsy(s), (includes endoscopic                            ultrasound examination limited to the esophagus,                            stomach or duodenum, and adjacent structures) Diagnosis Code(s):        --- Professional ---                           K83.8, Other specified diseases of biliary tract                           I89.9, Noninfective disorder of lymphatic vessels                            and lymph nodes, unspecified                           K86.89, Other specified diseases of pancreas                           R74.8, Abnormal levels of other serum enzymes                           R93.3, Abnormal findings on diagnostic imaging of                            other parts of digestive tract CPT copyright 2019 [employer]. All rights  reserved. The codes documented in this report are preliminary and upon coder review may  be revised to meet current compliance requirements. Arta Silence, MD 03/28/2021 12:00:27 PM This report has been signed electronically. Number of Addenda: 0

## 2021-03-28 NOTE — Discharge Instructions (Addendum)
Call if question or problem otherwise clear liquid diet until 5 PM and if doing well tonight may have soft solids and may advance diet tomorrow and if doing okay may resume Eliquis on Friday but call if nausea vomiting abdominal pain fever or signs of GI bleeding or black diarrhea  YOU HAD AN ENDOSCOPIC PROCEDURE TODAY: Refer to the procedure report and other information in the discharge instructions given to you for any specific questions about what was found during the examination. If this information does not answer your questions, please call [place] office at [phone] to clarify.   YOU SHOULD EXPECT: Some feelings of bloating in the abdomen. Passage of more gas than usual. Walking can help get rid of the air that was put into your GI tract during the procedure and reduce the bloating. If you had a lower endoscopy (such as a colonoscopy or flexible sigmoidoscopy) you may notice spotting of blood in your stool or on the toilet paper. Some abdominal soreness may be present for a day or two, also.  DIET: Your first meal following the procedure should be a light meal and then it is ok to progress to your normal diet. A half-sandwich or bowl of soup is an example of a good first meal. Heavy or fried foods are harder to digest and may make you feel nauseous or bloated. Drink plenty of fluids but you should avoid alcoholic beverages for 24 hours. If you had a esophageal dilation, please see attached instructions for diet.    ACTIVITY: Your care partner should take you home directly after the procedure. You should plan to take it easy, moving slowly for the rest of the day. You can resume normal activity the day after the procedure however YOU SHOULD NOT DRIVE, use power tools, machinery or perform tasks that involve climbing or major physical exertion for 24 hours (because of the sedation medicines used during the test).   SYMPTOMS TO REPORT IMMEDIATELY: A gastroenterologist can be reached at any hour.  Please call [phone]  for any of the following symptoms:   Following upper endoscopy (EGD, EUS, ERCP, esophageal dilation) Vomiting of blood or coffee ground material  New, significant abdominal pain  New, significant chest pain or pain under the shoulder blades  Painful or persistently difficult swallowing  New shortness of breath  Black, tarry-looking or red, bloody stools  FOLLOW UP:  If any biopsies were taken you will be contacted by phone or by letter within the next 1-3 weeks. Call [phone]  if you have not heard about the biopsies in 3 weeks.  Please also call with any specific questions about appointments or follow up tests.

## 2021-03-28 NOTE — Transfer of Care (Signed)
Immediate Anesthesia Transfer of Care Note  Patient: Casey Pierce  Procedure(s) Performed: FULL UPPER ENDOSCOPIC ULTRASOUND (EUS) RADIAL ENDOSCOPIC RETROGRADE CHOLANGIOPANCREATOGRAPHY (ERCP) FINE NEEDLE ASPIRATION (FNA) LINEAR SPHINCTEROTOMY BILIARY STENT PLACEMENT  Patient Location: PACU and Endoscopy Unit  Anesthesia Type:General  Level of Consciousness: awake and drowsy  Airway & Oxygen Therapy: Patient Spontanous Breathing and Patient connected to face mask oxygen  Post-op Assessment: Report given to RN and Post -op Vital signs reviewed and stable  Post vital signs: Reviewed and stable  Last Vitals:  Vitals Value Taken Time  BP 113/75 03/28/21 1234  Temp    Pulse 75 03/28/21 1236  Resp 22 03/28/21 1236  SpO2 100 % 03/28/21 1236  Vitals shown include unvalidated device data.  Last Pain:  Vitals:   03/28/21 1234  TempSrc:   PainSc: 0-No pain         Complications: No notable events documented.

## 2021-03-29 LAB — CYTOLOGY - NON PAP

## 2021-03-30 ENCOUNTER — Telehealth: Payer: Self-pay | Admitting: Physician Assistant

## 2021-03-30 NOTE — Telephone Encounter (Signed)
Scheduled appt per 1/12 referral. Pt is aware of appt date and time. Pt is aware to arrive 15 mins prior to appt time.  °

## 2021-03-31 ENCOUNTER — Encounter (HOSPITAL_COMMUNITY): Payer: Self-pay | Admitting: Gastroenterology

## 2021-04-04 DIAGNOSIS — R17 Unspecified jaundice: Secondary | ICD-10-CM | POA: Diagnosis not present

## 2021-04-04 DIAGNOSIS — Z2821 Immunization not carried out because of patient refusal: Secondary | ICD-10-CM | POA: Diagnosis not present

## 2021-04-04 DIAGNOSIS — R634 Abnormal weight loss: Secondary | ICD-10-CM | POA: Diagnosis not present

## 2021-04-04 DIAGNOSIS — K838 Other specified diseases of biliary tract: Secondary | ICD-10-CM | POA: Diagnosis not present

## 2021-04-04 DIAGNOSIS — C259 Malignant neoplasm of pancreas, unspecified: Secondary | ICD-10-CM | POA: Diagnosis not present

## 2021-04-05 DIAGNOSIS — C259 Malignant neoplasm of pancreas, unspecified: Secondary | ICD-10-CM | POA: Insufficient documentation

## 2021-04-05 NOTE — Progress Notes (Signed)
[place] Telephone:(336) [phone]   Fax:([phone]) [phone]  CONSULT NOTE  REFERRING PHYSICIAN: Dr. Paulita Fujita  REASON FOR CONSULTATION:  Newly diagnosed pancreatic adenocarcinoma  HPI Casey Pierce. is a 66 y.o. male with a past medical history significant for coronary artery disease, "mini" MI at the age of 14 s/p stent placement, left MCA CVA in 2014-04-29, persistent atrial fibrillation currently on anticoagulation, and GERD is referred to the clinic for evaluation of newly diagnosed pancreatic cancer.  At the end of November 2022/Early December 2022, the patient noticed that he had painless jaundice. The patient then made an appointment with his primary care provider who arranged for a right upper quadrant ultrasound which was performed on 03/03/2021. This revealed probable fatty infiltration of the liver, distended gallbladder without definite stones or wall thickening, and borderline common bile duct dilation.   The patient subsequently had an MR of the abdomen with MRCP with and without contrast on 03/15/2021 which showed enhancing 2.6 x 2.2 x 3.3 cm mass in the head of the pancreas causing obstruction of the distal common bile duct resulting in intra and extrahepatic biliary duct dilations.  Findings were noted to be highly concerning for primary pancreatic neoplasm.   The patient was referred to [facility] gastroenterology and had an appointment with PA Kristeen Miss on 03/22/21.  The patient noted associated increased fatigue, decreased appetite, and worsening reflux symptoms which started around Christmas. He also had dark tea colored urine and lighter, looser stools.  He estimated that he had more liquid and watery stools 4-5 times per day.  The patient denied any associated nausea or vomiting.  Denied any abdominal pain.  Denied any itching.  It was recommended that he take Pepto-Bismol for his reflux symptoms and diarrhea.  It was thought that his diarrhea was due to  cholestasis.  He was also set up for an ERCP and EUS.   Dr. Paulita Fujita and Dr. Watt Climes performed an EUS and ERCP on 03/28/2021.  The patient had a metallic stent placed. The mass measured 17m x 20 mm. There is no definite signs of metastatic disease in the abdomen. An intact interface was seen between the mass and the superior mesenteric artery, portal vein, superior mesenteric vein and splenic vein suggesting a lack of invasion per EUS report. The final pathology ([id number]) from the FNA of the pancreatic head lesion showed malignant cells consistent with adenocarcinoma.  He is [facility] Monday, [facility]  The patient saw his PCP earlier this week.  He had repeat lab work performed which showed downtrending LFTs per patient report.  These labs are not available to me.  Since having the stent placed, the patient has been feeling significantly improved.  He states that he feels 90% better with regard to his fatigue. He no longer has a diminished appetite.  He reports his urine is now clear.  He also states for the first time in 2 to 3 years he is having solid stools.  He is still visibly jaundiced, but this is reportedly better compared to prior.  He denies any chest pain, shortness of breath, or cough.  Denies any nausea, vomiting, or constipation.  Denies any fever, chills, night sweats, or lymphadenopathy.  He denies any back pain. Denies any abdominal pain.  Denies dysphagia.    The patient denies any family history of malignancy except for his father who passed away in [id number] to head and neck  cancer.  He believes the cancer was on his tongue.  The patient's mother is healthy.   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  The patient is a never smoker, nondrinker, and denies any drug use.  The patient is independent with his activities of daily living  and plays golf 2-3 times per week.  The patient has minimal side effects/debility from his history of stroke.  He reports that he has some numbness in the palm of his right hand.  Consequently, he sometimes cannot feel how hard he is gripping an object with his right hand.  Otherwise, he has normal strength and is right-handed and able to perform his normal activities.  He is married and lives at home with his wife, Chong Sicilian, who is accompanying him to his visit today.  He is also accompanied by 2 of his 3 daughters.   HPI  Past Medical History:  Diagnosis Date   Coronary artery disease    NSTEMI 12/1998 - LHC:  prox 85-90, RCA prox 30-50, EF 65-70 >> JFH:LKTG and BMS to LAD; large Dx jailed by stent (90%), EF 60  // Myoview 7/18: normal perfusion, Low Risk   History of echocardiogram    Echo 7/18: mild LVH, EF 60-65, no RWMA, mild MR, mod LAE   Myocardial infarction [name])    Stroke ([place]) 04/2014    Past Surgical History:  Procedure Laterality Date   BILIARY STENT PLACEMENT N/A 03/28/2021   Procedure: BILIARY STENT PLACEMENT;  Surgeon: Clarene Essex, MD;  Location: [facility] ENDOSCOPY;  Service: Endoscopy;  Laterality: N/A;   CARDIOVERSION N/A 06/20/2015   Procedure: CARDIOVERSION;  Surgeon: [name], MD;  Location: [place] ENDOSCOPY;  Service: Cardiovascular;  Laterality: N/A;   COLONOSCOPY WITH PROPOFOL N/A 02/07/2015   Procedure: COLONOSCOPY WITH PROPOFOL;  Surgeon: [name], MD;  Location: [facility] ENDOSCOPY;  Service: Endoscopy;  Laterality: N/A;   CORONARY STENT PLACEMENT  2001   ERCP N/A 03/28/2021   Procedure: ENDOSCOPIC RETROGRADE CHOLANGIOPANCREATOGRAPHY (ERCP);  Surgeon: Clarene Essex, MD;  Location: [facility];  Service: Endoscopy;  Laterality: N/A;   ESOPHAGOGASTRODUODENOSCOPY N/A 03/28/2021   Procedure: ESOPHAGOGASTRODUODENOSCOPY (EGD);  Surgeon: Arta Silence, MD;  Location: [facility];  Service: Gastroenterology;  Laterality: N/A;   EUS N/A 03/28/2021   Procedure: FULL UPPER  ENDOSCOPIC ULTRASOUND (EUS) RADIAL;  Surgeon: Arta Silence, MD;  Location: [facility];  Service: Gastroenterology;  Laterality: N/A;   FINE NEEDLE ASPIRATION N/A 03/28/2021   Procedure: FINE NEEDLE ASPIRATION (FNA) LINEAR;  Surgeon: Arta Silence, MD;  Location: [facility];  Service: Gastroenterology;  Laterality: N/A;   KNEE SURGERY  1972   SPHINCTEROTOMY  03/28/2021   Procedure: SPHINCTEROTOMY;  Surgeon: Clarene Essex, MD;  Location: [facility]OSCOPY;  Service: Endoscopy;;    Family History  Problem Relation Age of Onset   Cancer Father    Heart attack Maternal Uncle     Social History Social History   Tobacco Use   Smoking status: Never   Smokeless tobacco: Former    Types: Chew    Quit date: 03/20/1999   Tobacco comments:    quit chewing 7 years ago  Vaping Use   Vaping Use: Never used  Substance Use Topics   Alcohol use: No    Alcohol/week: 0.0 standard drinks   Drug use: No    No Known Allergies  Current Outpatient Medications  Medication Sig Dispense Refill   atorvastatin (LIPITOR) 10 MG tablet  1 tablet     ELIQUIS 5 MG TABS tablet TAKE 1 TABLET TWICE DAILY 180 tablet 0   famotidine (PEPCID) 20 MG tablet 1 tablet at bedtime as needed     No current facility-administered medications for this visit.    REVIEW OF SYSTEMS:   Review of Systems  Constitutional: Positive for fatigue but "90%" improved compared to prior.  Positive for weight loss.  Negative for appetite change (improved), chills, fever . HENT: Negative for mouth sores, nosebleeds, sore throat and trouble swallowing.   Eyes: Negative for eye problems.  Positive for scleral icterus. Respiratory: Negative for cough, hemoptysis, shortness of breath and wheezing.   Cardiovascular: Negative for chest pain and leg swelling.  Gastrointestinal: Negative for abdominal pain, constipation, diarrhea (improved), nausea and vomiting.  Genitourinary: Negative for bladder incontinence, difficulty urinating, dysuria,  frequency and hematuria.  Improvement in the dark/tea colored urine. Musculoskeletal: Negative for back pain, gait problem, neck pain and neck stiffness.  Skin: Negative for itching and rash.  Neurological: Negative for dizziness, extremity weakness, gait problem, headaches, light-headedness and seizures.  Hematological: Negative for adenopathy. Does not bruise/bleed easily.  Psychiatric/Behavioral: Negative for confusion, depression and sleep disturbance. The patient is not nervous/anxious.     PHYSICAL EXAMINATION:  Blood pressure 140/73, pulse 89, temperature 97.7 F (36.5 C), temperature source Tympanic, resp. rate 16, weight 158 lb (71.7 kg), SpO2 100 %.  ECOG PERFORMANCE STATUS: 1  Physical Exam  Constitutional: Oriented to person, HENT:  Head: Normocephalic and atraumatic.  Mouth/Throat: Oropharynx is clear and moist. No oropharyngeal exudate.  Eyes: Conjunctivae are normal. Right eye exhibits no discharge. Left eye exhibits no discharge.  Positive for yellowing of the sclera. Neck: Normal range of motion. Neck supple.  Cardiovascular: Normal rate, regular rhythm, normal heart sounds and intact distal pulses.   Pulmonary/Chest: Effort normal and breath sounds normal. No respiratory distress. No wheezes. No rales.  Abdominal: Soft. Bowel sounds are normal. Exhibits no distension and no mass. There is no tenderness.  Musculoskeletal: Normal range of motion. Exhibits no edema.  Lymphadenopathy:    No cervical adenopathy.  Neurological: Alert and oriented to person, place, and time. Exhibits normal muscle tone. Gait normal. Coordination normal.  Skin: The patient is visibly jaundiced (reportedly improved compared to last week). Skin is warm and dry. No rash noted. Not diaphoretic. No erythema. No pallor.  Psychiatric: Mood, memory and judgment normal.  Vitals reviewed.  LABORATORY DATA: Lab Results  Component Value Date   WBC 9.9 09/05/2016   HGB 15.7 09/05/2016   HCT 45.8  09/05/2016   MCV 88 09/05/2016   PLT 246 09/05/2016      Chemistry      Component Value Date/Time   NA 138 09/05/2016 1057   K 4.3 09/05/2016 1057   CL 101 09/05/2016 1057   CO2 23 09/05/2016 1057   BUN 10 09/05/2016 1057   CREATININE 0.85 09/05/2016 1057   CREATININE 0.72 06/06/2015 1445      Component Value Date/Time   CALCIUM 9.6 09/05/2016 1057   ALKPHOS 41 05/04/2014 0110   AST 44 (H) 05/04/2014 0110   ALT 55 (H) 05/04/2014 0110   BILITOT 0.9 05/04/2014 0110       RADIOGRAPHIC STUDIES: DG ERCP WITH SPHINCTEROTOMY  Result Date: 03/28/2021 CLINICAL DATA:  ERCP EXAM: ERCP TECHNIQUE: Multiple spot images obtained with the fluoroscopic device and submitted for interpretation post-procedure. FLUOROSCOPY TIME:  Refer to procedure report COMPARISON:  None. FINDINGS: A total of 4 fluoroscopic spot  images taken during ERCP are submitted for review. Initial images demonstrate scope overlying the upper abdomen with wire catheterization of the common bile duct. Contrast opacification of the common bile duct demonstrates a dilated appearance with abrupt shouldering compatible with obstruction. A metal CBD stent is then placed, and appears in grossly satisfactory position. IMPRESSION: Fluoroscopic spot images taken during ERCP with metallic stent deployment as described. Refer to dedicated procedure report for full details. These images were submitted for radiologic interpretation only. Please see the procedural report for the amount of contrast and the fluoroscopy time utilized. Electronically Signed   By: Albin Felling M.D.   On: 03/28/2021 12:41   MR ABDOMEN MRCP W WO CONTAST  Result Date: 03/16/2021 CLINICAL DATA:  66 year old male with history of elevated liver enzymes. Obstructive jaundice. EXAM: MRI ABDOMEN WITHOUT AND WITH CONTRAST (INCLUDING MRCP) TECHNIQUE: Multiplanar multisequence MR imaging of the abdomen was performed both before and after the administration of intravenous  contrast. Heavily T2-weighted images of the biliary and pancreatic ducts were obtained, and three-dimensional MRCP images were rendered by post processing. CONTRAST:  56m MULTIHANCE GADOBENATE DIMEGLUMINE 529 MG/ML IV SOLN COMPARISON:  No prior abdominal MRI. Abdominal ultrasound 03/03/2021. FINDINGS: Lower chest: Unremarkable. Hepatobiliary: No suspicious cystic or solid hepatic lesions. MRCP images demonstrate mild intrahepatic biliary ductal dilatation. Common bile duct is moderately dilated measuring up to 12 mm in the porta hepatis. There is abrupt cut off of the distal common bile duct which is irregular in appearance, related to the obstructing pancreatic head mass (discussed below). Gallbladder is moderately distended. No gallbladder wall thickening. No pericholecystic fluid or surrounding inflammatory changes. Pancreas: In the posterior aspect of the head of the pancreas (axial image 49 of series 23 and coronal image 38 of series 22) there is a lesion which is T1 hypointense, mildly T2 hyperintense, demonstrates diffusion restriction, and is hypovascular during arterial phase of the examination with progressive enhancement on more delayed imaging, compatible with neoplasm. This lesion measures 2.6 x 2.2 x 3.3 cm and causes obstruction of the distal common bile duct secondary to mass effect. Remaining portions of the pancreas are otherwise normal in appearance. No pancreatic ductal dilatation noted on MRCP images. No peripancreatic fluid collections or inflammatory changes. Spleen:  Unremarkable. Adrenals/Urinary Tract: Bilateral kidneys and bilateral adrenal glands are normal in appearance. No hydroureteronephrosis in the visualized portions of the abdomen. Stomach/Bowel: Visualized portions are unremarkable. Vascular/Lymphatic: No aneurysm identified in the visualized abdominal vasculature. No lymphadenopathy noted in the abdomen. Other: No significant volume of ascites noted in the visualized portions of  the peritoneal cavity. Musculoskeletal: No aggressive appearing osseous lesions are noted in the visualized portions of the skeleton. IMPRESSION: 1. Enhancing 2.6 x 2.2 x 3.3 cm mass in the head of the pancreas causing obstruction of the distal common bile duct resulting in intra and extrahepatic biliary ductal dilatation. Findings are highly concerning for primary pancreatic neoplasm. Further evaluation with endoscopic ultrasound is strongly recommended in the near future. 2. No definite signs of metastatic disease identified in the abdomen. Electronically Signed   By: DVinnie LangtonM.D.   On: 03/16/2021 09:57    ASSESSMENT: This is a very pleasant 66year old Caucasian male with:  Pancreatic adenocarcinoma.  Stage as T2 N1. Stage IIB diagnosed in January 2022 -The patient presented with painless obstructive jaundice at the end of November/early December 2022. -Patient had a right upper quadrant ultrasound and subsequently MRI of the abdomen 03/07/2021 which showed a 2.6 x 2.2 x 3.3 cm  mass in the head of the pancreas causing obstruction of the distal common bile duct resulting in intra and extrahepatic biliary duct dilation. -Patient underwent ERCP and EUS on 03/28/2021 with stent placement.  -The mass is 25 mm x 20 mm. An intact interface was seen between the mass and the superior mesenteric artery, portal vein, superior mesenteric vein and splenic vein suggesting a lack of invasion per EUS report. The final pathology ([id number]) from the FNA of the pancreatic head lesion showed malignant cells consistent with adenocarcinoma. -We will request MMR testing today. Reportedly not enough tissue.  -The patient was seen with Dr. Burr Medico today.  -Dr. Burr Medico discussed that this is an aggressive cancer in nature, prognosis, and risk of recurrence.  -Dr. Burr Medico recommends that we complete the staging work-up with CT of the chest to complete the staging workup.  -Dr. Burr Medico discussed that the only potential  curative option is surgery. Dr. Burr Medico discussed that her cancer is likely resectable given the location and that it is not invading any of the major vessels.  -Dr. Burr Medico discussed the role of neoadjuvant or adjuvant chemotherapy.  Dr. Burr Medico discussed that she would consider the patient for FOLFIRINOX given his good performance status.  Dr. Burr Medico discussed that neoadjuvant treatment is usually given once every 2 weeks for approximately 4 months.  Discussed that a Port-A-Cath and pump is required.  Discussed a Port-A-Cath to be placed by Dr. Barry Dienes.  Discussed that Burr Medico D/C is 2 days after chemotherapy infusion.  She also discussed that treatment involves G-CSF support. -The patient is scheduled to see Dr. Barry Dienes from [facility] next week on 04/10/2021.  -Dr. Burr Medico discussed the nature of pancreatic cancer and high recurrence risk after surgery alone.  If she proceeds with surgery first, Dr. Burr Medico will recommend adjuvant chemotherapy for 6 months.   - The intent of treatment would be with the intent to cure.  Dr. Burr Medico discussed that the cure rate in patients that undergo surgery and chemotherapy is around 30%. -Dr. Burr Medico discussed the adverse side effects of treatment including but not limited to fatigue, alopecia, peripheral neuropathy, nausea, vomiting, myelosuppression, and risk of infection. -If surgery recommends neoadjuvant chemotherapy, then we will arrange for chemo education class, antiemetics to be sent to the patient's pharmacy, and to arrange for his for first cycle of treatment in approximately 2 weeks. -We will follow-up with the patient after he sees surgery to discuss the final plan. We will discussed the patient's case at the multidisciplinary GI conference next week. -Per Dr. Burr Medico, jaundice can affect the accuracy of CA 19.9.  We will plan on arranging for a lab appointment prior to his chemo education class next week to check CBC, CMP, and CA 19.9 as long as his jaundice continues to  improve.  2. Genetics -Will place referral to genetic counselor   3. Comorbidities -Patient has history of CVA in 2016 The patient has some numbness in his right palm, but is functional with his activities of daily living without any significant debility.  4. Health Maintenance -The patient received 2 COVID-19 vaccines. -The patient has not had a flu shot.  He is agreeable to receive a flu shot today.   PLAN: -Schedule CT of chest to complete staging workup -Ca 19.9, CBC and CMP next week before possible chemo education class as long as jaundice is improving -Requested MMR. Reportedly not enough tissue -Genetics referral -Follow-up in approximately 2 weeks if multidisciplinary team is in agreement to proceed with  neoadjuvant chemotherapy.  Will need Port-A-Cath placement.   The patient voices understanding of current disease status and treatment options and is in agreement with the current care plan.  All questions were answered. The patient knows to call the clinic with any problems, questions or concerns. We can certainly see the patient much sooner if necessary.  Thank you so much for allowing me to participate in the care of [employer].. I will continue to follow up the patient with you and assist in his care.  Disclaimer: This note was dictated with voice recognition software. Similar sounding words can inadvertently be transcribed and may not be corrected upon review.   Shaniquia Brafford L Kielyn Kardell April 07, 2021, 1:28 PM  Addendum  I have seen the patient, examined him. I agree with the assessment and and plan and have edited the notes.   66 yo male with PMH of CAD, remote MI, AF and related stroke in 2016, with minimal residual right hand weakness, presented with painless jaundice.  His work-up unfortunately showed biopsy confirmed adenocarcinoma in the head of the pancreas, T2N1 by EUS and image, without significant arterial or venous vascular involvement.  No  distant metastasis on abdominal MRI.  Will obtain CT chest to complete staging.  I discussed his diagnosis, staging, and prognosis of pancreatic cancer.  This is resectable disease, however due to the size and positive lymph nodes, I recommended neoadjuvant chemotherapy, will see if hs surgeon Dr. Barry Dienes will agree, he will be seen by Dr. Barry Dienes next Monday.  I discussed the regimen of chemotherapy, he is a candidate for FOLFIRINOX, benefit and potential side effects were discussed with him in detail.  He agrees to proceed.  Plan to start in about 2 weeks after his visit with Dr. Barry Dienes, port placement, chemo class and a CT chest..  All questions were answered.  Truitt Merle  04/07/2021

## 2021-04-07 ENCOUNTER — Telehealth: Payer: Self-pay | Admitting: Physician Assistant

## 2021-04-07 ENCOUNTER — Inpatient Hospital Stay: Payer: Medicare PPO | Attending: Physician Assistant | Admitting: Physician Assistant

## 2021-04-07 ENCOUNTER — Encounter: Payer: Self-pay | Admitting: Physician Assistant

## 2021-04-07 ENCOUNTER — Inpatient Hospital Stay: Payer: Medicare PPO

## 2021-04-07 ENCOUNTER — Telehealth: Payer: Self-pay

## 2021-04-07 ENCOUNTER — Other Ambulatory Visit: Payer: Self-pay

## 2021-04-07 VITALS — BP 140/73 | HR 89 | Temp 97.7°F | Resp 16 | Wt 158.0 lb

## 2021-04-07 DIAGNOSIS — Z87891 Personal history of nicotine dependence: Secondary | ICD-10-CM | POA: Diagnosis not present

## 2021-04-07 DIAGNOSIS — I252 Old myocardial infarction: Secondary | ICD-10-CM | POA: Insufficient documentation

## 2021-04-07 DIAGNOSIS — Z23 Encounter for immunization: Secondary | ICD-10-CM | POA: Diagnosis not present

## 2021-04-07 DIAGNOSIS — Z8673 Personal history of transient ischemic attack (TIA), and cerebral infarction without residual deficits: Secondary | ICD-10-CM | POA: Insufficient documentation

## 2021-04-07 DIAGNOSIS — Z7901 Long term (current) use of anticoagulants: Secondary | ICD-10-CM | POA: Insufficient documentation

## 2021-04-07 DIAGNOSIS — C250 Malignant neoplasm of head of pancreas: Secondary | ICD-10-CM | POA: Diagnosis not present

## 2021-04-07 DIAGNOSIS — Z79899 Other long term (current) drug therapy: Secondary | ICD-10-CM | POA: Diagnosis not present

## 2021-04-07 DIAGNOSIS — I4819 Other persistent atrial fibrillation: Secondary | ICD-10-CM | POA: Diagnosis not present

## 2021-04-07 MED ORDER — INFLUENZA VAC A&B SA ADJ QUAD 0.5 ML IM PRSY
0.5000 mL | PREFILLED_SYRINGE | Freq: Once | INTRAMUSCULAR | Status: AC
  Administered 2021-04-07: 0.5 mL via INTRAMUSCULAR
  Filled 2021-04-07: qty 0.5

## 2021-04-07 NOTE — Progress Notes (Signed)
I met with Mr and Mrs Zanders during  his consultation with Cassie Heilingoepter, PA and Dr Burr Medico.  I explained my role as a nurse navigator and provided my contact information. I explained the services provided at [facility] and provided written information.  I explained the alight grant and let  them know one of the financial advisors will reach out to  them at the time of his chemo education class. I briefly explained insertion and care of a port a cath.  I showed a sample of the port a cath.  I I told them that his will be scheduled for chemotherapy education class prior to receiving chemotherapy.  I told him our schedulers will call him with those appts.  All questions were answered.  They verbalized understanding.

## 2021-04-07 NOTE — Patient Instructions (Addendum)
Summary:  -The sample (biopsy) that they took of your tumor was consistent with Pancreatic Cancer. I have more information on later pages if you are interested in reading more about this.  Luckily, it does not appear that there is any other sites of disease and it does not appear that there is any blood vessel involvement.  Some of the lymph nodes in this area do appear abnormal.  Dr. Burr Medico has stage this as a stage Iib.  -We covered a lot of important information at your appointment today regarding what the plan of care is.  -We need a CT of the chest to complete the staging workup. They will call you from the radiology scheduling department to schedule this at the [place] department. They will call you, but if you need to contact them, their number is [phone].  If you need to call them, all you need to do is tell them your name and date of birth and that you are trying to schedule your scan and they will take it from there. -We would like you to see the surgeon next week to discuss if she would recommend surgery first or chemotherapy first. Please keep the appointment with Dr. Norman Clay next week. We will discuss your case at the conference next week. -If we proceed with chemotherapy first, Dr. Burr Medico would recommend treatment treatment with FOLFIRINOX, this is 3 chemotherapy drugs (oxaliplatin, 5-FU, irinotecan) and a vitamin (leucovorin).  I have more information about these medications on the later pages; however, before you start any treatment we would have you attend chemo education class.  This involves you sitting down one-on-one with the nurse educator to discuss the treatment in more depth.  They discuss resources and possible side effects and how to manage common side effects at this class.  This class would take place prior to starting any treatment. -Treatment is once every 2 weeks; however, you go home with a pump and come back 2 days later to remove the pump.  Treatment is  typically given for 4 months.  We repeat a CT scan after 3 months of treatment.  In some situations, Dr. Burr Medico may recommend 2 additional months of chemotherapy after surgery. -Because of the pump, this is why you will need a Port-A-Cath.  Dr. Barry Dienes can discuss Port-A-Cath placement when she sees you next week.  She will also need to send you a numbing cream to your pharmacy to use for the port once the site has healed.  You may use ice with your first treatment to numb the area, since you may not be able to use the numbing cream at the time of your first infusion.  -Side effects of chemotherapy can include fatigue, losing your hair, numbness/tingling in the hands/fingers, risk of infection, nausea/vomiting, and drops in the blood count. Of course, we will be monitoring you and your blood work closely to limit or prevent/manage side effects.  We often arrange for you to receive an injection that boost your infection fighting cells after chemotherapy.  This helps protect you from infection.  -If Dr. Norman Clay recommends that we proceed with chemotherapy then we will help facilitate scheduling the chemoeducation appointments and chemotherapy and follow up appointments.  We would also need to send in a prescription for antinausea medicines -We have submitted your wife's paperwork for FMLA to the [facility] department.      Referrals or Imaging: -CT scan of the chest early next week once we have insurance approval  -I  will refer you to genetics to ensure that you do not have any hereditary cancer, because if positive, then your daughters should be tested. It is a blood test. They will call you about this appointment -If you ever decide that you are interested in seeing a nutritionist, we do have them onsite and would be happy to place referral.  We will arrange for you to have blood work performed the day of your chemo education class, as long as her jaundice continues to improve.  We will check of the tumor marker  at that time and we will be monitoring it throughout the course of treatment to ensure that it is improving.     Follow up:  -We will see you back once we have confirmation from Dr. Barry Dienes to help facilitate scheduling appointments and sending in prescriptions.

## 2021-04-07 NOTE — Telephone Encounter (Signed)
I have  LVM for Dr. Ladell Heads nurse requesting they fax his lab results from 04/03/21.

## 2021-04-07 NOTE — Telephone Encounter (Signed)
Scheduled appointment per 1/20 scheduling message. Patient is aware of upcoming appointment.

## 2021-04-09 ENCOUNTER — Encounter: Payer: Self-pay | Admitting: Hematology

## 2021-04-09 NOTE — Progress Notes (Signed)
START ON PATHWAY REGIMEN - Pancreatic Adenocarcinoma     A cycle is every 14 days:     Oxaliplatin      Leucovorin      Irinotecan      Fluorouracil   **Always confirm dose/schedule in your pharmacy ordering system**  Patient Characteristics: Preoperative (Clinical Staging), Resectable, Neoadjuvant Therapy Planned, BRCA1/2 and PALB2 Mutation Absent/Unknown Therapeutic Status: Preoperative (Clinical Staging) AJCC T Category: cT2 AJCC N Category: cN1 Resectability Status: Resectable AJCC M Category: cM0 AJCC 8 Stage Grouping: IIB BRCA1/2 Mutation Status: Awaiting Test Results PALB2 Mutation Status: Awaiting Test Results Intent of Therapy: Curative Intent, Discussed with Patient

## 2021-04-10 ENCOUNTER — Other Ambulatory Visit: Payer: Self-pay | Admitting: General Surgery

## 2021-04-10 DIAGNOSIS — Z8673 Personal history of transient ischemic attack (TIA), and cerebral infarction without residual deficits: Secondary | ICD-10-CM | POA: Diagnosis not present

## 2021-04-10 DIAGNOSIS — Z7901 Long term (current) use of anticoagulants: Secondary | ICD-10-CM | POA: Diagnosis not present

## 2021-04-10 DIAGNOSIS — I4891 Unspecified atrial fibrillation: Secondary | ICD-10-CM | POA: Diagnosis not present

## 2021-04-10 DIAGNOSIS — C250 Malignant neoplasm of head of pancreas: Secondary | ICD-10-CM | POA: Diagnosis not present

## 2021-04-10 DIAGNOSIS — I2510 Atherosclerotic heart disease of native coronary artery without angina pectoris: Secondary | ICD-10-CM | POA: Diagnosis not present

## 2021-04-11 ENCOUNTER — Other Ambulatory Visit: Payer: Self-pay | Admitting: Hematology

## 2021-04-11 DIAGNOSIS — C250 Malignant neoplasm of head of pancreas: Secondary | ICD-10-CM

## 2021-04-11 MED ORDER — ONDANSETRON HCL 8 MG PO TABS: 8.0000 mg | ORAL_TABLET | Freq: Two times a day (BID) | ORAL | 1 refills | Status: DC | PRN

## 2021-04-11 MED ORDER — PROCHLORPERAZINE MALEATE 10 MG PO TABS: 10.0000 mg | ORAL_TABLET | Freq: Four times a day (QID) | ORAL | 1 refills | Status: DC | PRN

## 2021-04-11 MED ORDER — LIDOCAINE-PRILOCAINE 2.5-2.5 % EX CREA: TOPICAL_CREAM | CUTANEOUS | 3 refills | Status: AC

## 2021-04-12 ENCOUNTER — Other Ambulatory Visit: Payer: Self-pay

## 2021-04-12 ENCOUNTER — Telehealth: Payer: Self-pay | Admitting: Hematology

## 2021-04-12 NOTE — Progress Notes (Signed)
The proposed treatment discussed in conference is for discussion purpose only and is not a binding recommendation.  The patients have not been physically examined, or presented with their treatment options.  Therefore, final treatment plans cannot be decided.  

## 2021-04-12 NOTE — Telephone Encounter (Signed)
Sch per 1/24 inbasket, left msg with pt.

## 2021-04-12 NOTE — Progress Notes (Signed)
Pharmacist Chemotherapy Monitoring - Initial Assessment    Anticipated start date: 04/19/21   The following has been reviewed per standard work regarding the patient's treatment regimen: The patient's diagnosis, treatment plan and drug doses, and organ/hematologic function Lab orders and baseline tests specific to treatment regimen  The treatment plan start date, drug sequencing, and pre-medications Prior authorization status  Patient's documented medication list, including drug-drug interaction screen and prescriptions for anti-emetics and supportive care specific to the treatment regimen The drug concentrations, fluid compatibility, administration routes, and timing of the medications to be used The patient's access for treatment and lifetime cumulative dose history, if applicable  The patient's medication allergies and previous infusion related reactions, if applicable   Changes made to treatment plan:  N/A  Follow up needed:  Pending authorization for treatment  Need updated CMET/CBC    Kennith Center, Pharm.D., CPP 04/12/2021@2 :06 PM

## 2021-04-13 MED ORDER — CHLORHEXIDINE GLUCONATE CLOTH 2 % EX PADS: 6.0000 | MEDICATED_PAD | Freq: Once | CUTANEOUS | Status: DC

## 2021-04-13 NOTE — Progress Notes (Signed)
° ° ° ° °  Enhanced Recovery after Surgery for Orthopedics Enhanced Recovery after Surgery is a protocol used to improve the stress on your body and your recovery after surgery.  Patient Instructions  The night before surgery:  No food after midnight. ONLY clear liquids after midnight  The day of surgery (if you do NOT have diabetes):  Drink ONE (1) Pre-Surgery Clear Ensure as directed.   This drink was given to you during your hospital  pre-op appointment visit. The pre-op nurse will instruct you on the time to drink the  Pre-Surgery Ensure depending on your surgery time. Finish the drink at the designated time by the pre-op nurse.  Nothing else to drink after completing the  Pre-Surgery Clear Ensure.  The day of surgery (if you have diabetes): Drink ONE (1) Gatorade 2 (G2) as directed. This drink was given to you during your hospital  pre-op appointment visit.  The pre-op nurse will instruct you on the time to drink the   Gatorade 2 (G2) depending on your surgery time. Color of the Gatorade may vary. Red is not allowed. Nothing else to drink after completing the  Gatorade 2 (G2).         If you have questions, please contact your surgeons office.  Pt given drink and instructions, verbalized understanding.

## 2021-04-14 ENCOUNTER — Other Ambulatory Visit: Payer: Self-pay | Admitting: Hematology

## 2021-04-14 ENCOUNTER — Inpatient Hospital Stay: Payer: Medicare PPO

## 2021-04-14 ENCOUNTER — Other Ambulatory Visit: Payer: Self-pay

## 2021-04-14 DIAGNOSIS — C250 Malignant neoplasm of head of pancreas: Secondary | ICD-10-CM

## 2021-04-17 ENCOUNTER — Ambulatory Visit (HOSPITAL_COMMUNITY)
Admission: RE | Admit: 2021-04-17 | Discharge: 2021-04-17 | Disposition: A | Discharge status: Home / Self Care | Payer: Medicare PPO | Source: Ambulatory Visit | Attending: Physician Assistant | Admitting: Physician Assistant

## 2021-04-17 ENCOUNTER — Other Ambulatory Visit: Payer: Self-pay

## 2021-04-17 DIAGNOSIS — I700 Atherosclerosis of aorta: Secondary | ICD-10-CM | POA: Diagnosis not present

## 2021-04-17 DIAGNOSIS — R918 Other nonspecific abnormal finding of lung field: Secondary | ICD-10-CM | POA: Diagnosis not present

## 2021-04-17 DIAGNOSIS — C250 Malignant neoplasm of head of pancreas: Secondary | ICD-10-CM | POA: Diagnosis not present

## 2021-04-17 DIAGNOSIS — R911 Solitary pulmonary nodule: Secondary | ICD-10-CM | POA: Diagnosis not present

## 2021-04-17 LAB — POCT I-STAT CREATININE: Creatinine, Ser: 0.8 mg/dL (ref 0.61–1.24)

## 2021-04-17 MED ORDER — IOHEXOL 300 MG/ML  SOLN
75.0000 mL | Freq: Once | INTRAMUSCULAR | Status: AC | PRN
  Administered 2021-04-17: 75 mL via INTRAVENOUS

## 2021-04-17 NOTE — H&P (Signed)
Chief Complaint  Patient presents with   Pancreatic Cancer        Referring MD: Burr Medico   HISTORY: Patient is a 66 year old male who presented to medical attention with jaundice.  His primary care provider ordered labs as well as a right upper quadrant ultrasound.  He was found to have elevated bilirubin and borderline ductal dilatation.  MRCP confirmed this and showed a 2.5 cm mass in the head of the pancreas.  He was referred to [facility] and subsequently had a ERCP by Dr. May Guidant endoscopic ultrasound by Dr. Paulita Fujita.  A stent was placed.  He also had a sphincterotomy.  He was seen to have an intact interface between the mass and the SMV and SMA.  He did however appear to have several nodes in the porta that were positive.  His ultrasound staging was uT3N1.   His appetite and fatigue have improved since he was stented. He lost around 6 pounds, but he is gaining this back.  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  One of his daughters is a Marine scientist and lives close by.  She works nights with home health and has a flexible schedule.  His wife is retiring March 31.   EUS:  03/28/21 Outlaw There was dilation in the common bile duct which measured up to 15 mm. - A few abnormal lymph nodes were visualized in the porta hepatis region. - A mass was identified in the pancreatic head. This was staged T3 N1 Mx by endosonographic criteria. Fine needle aspiration performed. - There was no evidence of significant pathology in the left lobe of the liver   ERCP:  [facility] 03/28/21 After a short sphincterotomy deep selective cannulation was readily obtained and dye was injected and a long distal stricture was confirmed as well as the dilated duct and the biliary sphincterotomy was extended with a Hydratome sphincterotome using ERBE electrocautery until we had a little biliary drainage and could get the half bowed sphincterotome in and out of the duct.   MRI abd  with MRCP 03/15/21 IMPRESSION: 1. Enhancing 2.6 x 2.2 x 3.3 cm mass in the head of the pancreas causing obstruction of the distal common bile duct resulting in intra and extrahepatic biliary ductal dilatation. Findings are highly concerning for primary pancreatic neoplasm. Further evaluation with endoscopic ultrasound is strongly recommended in the near future. 2. No definite signs of metastatic disease identified in the abdomen.   Cytology 03/28/21 FINAL MICROSCOPIC DIAGNOSIS:  - Malignant cells consistent with adenocarcinoma        Past Medical History:  Diagnosis Date   History of cancer     History of stroke     Hyperlipidemia             Past Surgical History:  Procedure Laterality Date   CARDIOVERSION EXTERNAL   06/20/2015    Dr. Sallyanne Kuster   ERCP; Sphincterotomy; Biliary Stent Placement   03/28/2021    Dr. Watt Climes      Current Medications        Current Outpatient Medications  Medication Sig Dispense Refill   apixaban (ELIQUIS) 5 mg tablet 1 tablet       atorvastatin (LIPITOR) 10 MG tablet 1 tablet       famotidine (PEPCID) 20 MG tablet 1 tablet at bedtime as needed        No current facility-administered medications for this visit.  No Known Allergies          Family History  Problem Relation Age of Onset   High blood pressure (Hypertension) Mother     Skin cancer Mother     Hyperlipidemia (Elevated cholesterol) Father          Social History          Socioeconomic History   Marital status: Married  Tobacco Use   Smoking status: Never   Smokeless tobacco: Never  Substance and Sexual Activity   Alcohol use: Never   Drug use: Never          REVIEW OF SYSTEMS - PERTINENT POSITIVES ONLY: A complete review of systems negative other than HPI and PMH   EXAM:    Vitals:    04/10/21 1518  BP: 132/70  Pulse: 99  Temp: 37 C (98.6 F)         Wt Readings from Last 3 Encounters:  04/10/21 71.9 kg (158 lb 9.6 oz)        Gen:  No  acute distress.  Well nourished and well groomed.   Neurological: Alert and oriented to person, place, and time. Coordination normal.  Head: Normocephalic and atraumatic.  Eyes: Conjunctivae are normal. Pupils are equal, round, and reactive to light. No scleral icterus.  Neck: Normal range of motion. Neck supple. No tracheal deviation or thyromegaly present.  Cardiovascular: Normal rate, regular rhythm, normal heart sounds and intact distal pulses.  Exam reveals no gallop and no friction rub.  No murmur heard. Respiratory: Effort normal.  No respiratory distress. No chest wall tenderness. Breath sounds normal.  No wheezes, rales or rhonchi.  GI: Soft. Bowel sounds are normal. The abdomen is soft and nontender.  There is no rebound and no guarding.  Musculoskeletal: Normal range of motion. Extremities are nontender.  Lymphadenopathy: No cervical, preauricular, postauricular or axillary adenopathy is present Skin: Skin is warm and dry. No rash noted. No diaphoresis. No erythema. No pallor. No clubbing, cyanosis, or edema.   Psychiatric: Normal mood and affect. Behavior is normal. Judgment and thought content normal.      LABORATORY RESULTS: Available labs are reviewed    Pending.     RADIOLOGY RESULTS: See E-Chart or I-Site for most recent results.  Images and reports are reviewed. See above     ASSESSMENT AND PLAN: Adenocarcinoma of head of pancreas (CMS-HCC) This appears to be resectable.  I do recommend neoadjuvant chemotherapy given the fact that the masses are 2 cm and there appeared to be lymph nodes involved.  This will ensure that he has some systemic treatment prior to localized treatment.  This would then be followed by Whipple.  We will place a Port-A-Cath.  He will need to hold his anticoagulation.   I discussed the surgery with the patient including diagrams of anatomy.  I discussed the potential for diagnostic laparoscopy.  In the case of pancreatic cancer, if spread of the  disease is found, we will abort the procedure and not proceed with resection.  The rationale for this was discussed with the patient.  There has not been data to support resection of Stage IV disease in terms of survival benefit.     We discussed possible complications including: Potential of aborting procedure if tumor is invading the superior mesenteric or hepatic arteries Bleeding Infection and possible wound complications such as hernia Damage to adjacent structures Leak of anastamoses, primarily pancreatic Possible need for other procedures Possible prolonged nausea with possible need for  external feeding.   Possible prolonged hospital stay. Possible development of diabetes or worsening of current diabetes.  Possible pancreatic exocrine insufficiency Prolonged fatigue/weakness/appetite Possible early recurrence of cancer     The patient understands and wishes to proceed.  The patient has been advised to turn in disability paperwork to our office.       Coronary artery disease involving native coronary artery of native heart without angina pectoris Patient has been stented and is doing better.  He will need cardiac risk stratification prior to Whipple.     Dr. Burr Medico is planning on rechecking his labs next week when his jaundice is improved.  Staging chest CT is scheduled for next Monday.  Schedule his port.  We will hold his Eliquis 2 days preop.  I have sent a message to Dr. [name] about cardiology risk stratification.         Milus Height, MD FACS Surgical Oncology, General Surgery, Trauma and [place] [facility], [place]

## 2021-04-18 ENCOUNTER — Ambulatory Visit (HOSPITAL_COMMUNITY): Payer: Medicare PPO

## 2021-04-18 ENCOUNTER — Other Ambulatory Visit: Payer: Self-pay

## 2021-04-18 ENCOUNTER — Encounter (HOSPITAL_BASED_OUTPATIENT_CLINIC_OR_DEPARTMENT_OTHER)
Admission: RE | Disposition: A | Discharge status: Home / Self Care | Payer: Self-pay | Source: Home / Self Care | Attending: General Surgery

## 2021-04-18 ENCOUNTER — Encounter (HOSPITAL_BASED_OUTPATIENT_CLINIC_OR_DEPARTMENT_OTHER): Payer: Self-pay | Admitting: General Surgery

## 2021-04-18 ENCOUNTER — Ambulatory Visit (HOSPITAL_BASED_OUTPATIENT_CLINIC_OR_DEPARTMENT_OTHER): Payer: Medicare PPO | Admitting: Anesthesiology

## 2021-04-18 ENCOUNTER — Ambulatory Visit (HOSPITAL_BASED_OUTPATIENT_CLINIC_OR_DEPARTMENT_OTHER)
Admission: RE | Admit: 2021-04-18 | Discharge: 2021-04-18 | Disposition: A | Discharge status: Home / Self Care | Payer: Medicare PPO | Attending: General Surgery | Admitting: General Surgery

## 2021-04-18 DIAGNOSIS — I4891 Unspecified atrial fibrillation: Secondary | ICD-10-CM | POA: Insufficient documentation

## 2021-04-18 DIAGNOSIS — C250 Malignant neoplasm of head of pancreas: Secondary | ICD-10-CM | POA: Diagnosis not present

## 2021-04-18 DIAGNOSIS — Z452 Encounter for adjustment and management of vascular access device: Secondary | ICD-10-CM | POA: Diagnosis not present

## 2021-04-18 DIAGNOSIS — K831 Obstruction of bile duct: Secondary | ICD-10-CM | POA: Diagnosis not present

## 2021-04-18 DIAGNOSIS — Z95828 Presence of other vascular implants and grafts: Secondary | ICD-10-CM

## 2021-04-18 DIAGNOSIS — Z955 Presence of coronary angioplasty implant and graft: Secondary | ICD-10-CM | POA: Diagnosis not present

## 2021-04-18 DIAGNOSIS — I2510 Atherosclerotic heart disease of native coronary artery without angina pectoris: Secondary | ICD-10-CM | POA: Insufficient documentation

## 2021-04-18 DIAGNOSIS — R911 Solitary pulmonary nodule: Secondary | ICD-10-CM | POA: Diagnosis not present

## 2021-04-18 DIAGNOSIS — I252 Old myocardial infarction: Secondary | ICD-10-CM | POA: Insufficient documentation

## 2021-04-18 DIAGNOSIS — Z8673 Personal history of transient ischemic attack (TIA), and cerebral infarction without residual deficits: Secondary | ICD-10-CM | POA: Diagnosis not present

## 2021-04-18 HISTORY — PX: PORTACATH PLACEMENT: SHX2246

## 2021-04-18 HISTORY — DX: Unspecified atrial fibrillation: I48.91

## 2021-04-18 HISTORY — DX: Malignant neoplasm of pancreas, unspecified: C25.9

## 2021-04-18 SURGERY — INSERTION, TUNNELED CENTRAL VENOUS DEVICE, WITH PORT
Anesthesia: General | Site: Chest

## 2021-04-18 MED ORDER — DEXAMETHASONE SODIUM PHOSPHATE 10 MG/ML IJ SOLN
INTRAMUSCULAR | Status: AC
  Filled 2021-04-18: qty 1

## 2021-04-18 MED ORDER — BUPIVACAINE HCL (PF) 0.25 % IJ SOLN
INTRAMUSCULAR | Status: AC
  Filled 2021-04-18: qty 60

## 2021-04-18 MED ORDER — HEPARIN (PORCINE) IN NACL 2-0.9 UNITS/ML
INTRAMUSCULAR | Status: AC | PRN
  Administered 2021-04-18: 500 mL via INTRAVENOUS

## 2021-04-18 MED ORDER — ACETAMINOPHEN 500 MG PO TABS
ORAL_TABLET | ORAL | Status: AC
  Filled 2021-04-18: qty 2

## 2021-04-18 MED ORDER — MIDAZOLAM HCL 5 MG/5ML IJ SOLN
INTRAMUSCULAR | Status: DC | PRN
  Administered 2021-04-18: .5 mg via INTRAVENOUS
  Administered 2021-04-18: 1 mg via INTRAVENOUS
  Administered 2021-04-18: .5 mg via INTRAVENOUS

## 2021-04-18 MED ORDER — FENTANYL CITRATE (PF) 100 MCG/2ML IJ SOLN
INTRAMUSCULAR | Status: DC | PRN
  Administered 2021-04-18: 25 ug via INTRAVENOUS
  Administered 2021-04-18: 50 ug via INTRAVENOUS

## 2021-04-18 MED ORDER — ONDANSETRON HCL 4 MG/2ML IJ SOLN
INTRAMUSCULAR | Status: DC | PRN
Start: 2021-04-18 — End: 2021-04-18
  Administered 2021-04-18: 4 mg via INTRAVENOUS

## 2021-04-18 MED ORDER — CEFAZOLIN SODIUM-DEXTROSE 2-4 GM/100ML-% IV SOLN
2.0000 g | INTRAVENOUS | Status: AC
  Administered 2021-04-18: 2 g via INTRAVENOUS

## 2021-04-18 MED ORDER — BUPIVACAINE HCL (PF) 0.25 % IJ SOLN
INTRAMUSCULAR | Status: AC
  Filled 2021-04-18: qty 30

## 2021-04-18 MED ORDER — OXYCODONE HCL 5 MG PO TABS: 5.0000 mg | ORAL_TABLET | Freq: Four times a day (QID) | ORAL | 0 refills | Status: DC | PRN

## 2021-04-18 MED ORDER — DEXAMETHASONE SODIUM PHOSPHATE 10 MG/ML IJ SOLN
INTRAMUSCULAR | Status: DC | PRN
  Administered 2021-04-18: 10 mg via INTRAVENOUS

## 2021-04-18 MED ORDER — ACETAMINOPHEN 500 MG PO TABS
1000.0000 mg | ORAL_TABLET | ORAL | Status: AC
  Administered 2021-04-18: 1000 mg via ORAL

## 2021-04-18 MED ORDER — ONDANSETRON HCL 4 MG/2ML IJ SOLN
INTRAMUSCULAR | Status: AC
  Filled 2021-04-18: qty 2

## 2021-04-18 MED ORDER — PROPOFOL 10 MG/ML IV BOLUS
INTRAVENOUS | Status: AC
  Filled 2021-04-18: qty 20

## 2021-04-18 MED ORDER — HEPARIN (PORCINE) IN NACL 1000-0.9 UT/500ML-% IV SOLN
INTRAVENOUS | Status: AC
  Filled 2021-04-18: qty 500

## 2021-04-18 MED ORDER — OXYCODONE HCL 5 MG/5ML PO SOLN: 5.0000 mg | Freq: Once | ORAL | Status: DC | PRN

## 2021-04-18 MED ORDER — BUPIVACAINE HCL (PF) 0.25 % IJ SOLN
INTRAMUSCULAR | Status: DC | PRN
  Administered 2021-04-18: 8 mL

## 2021-04-18 MED ORDER — MIDAZOLAM HCL 2 MG/2ML IJ SOLN
INTRAMUSCULAR | Status: AC
  Filled 2021-04-18: qty 2

## 2021-04-18 MED ORDER — LIDOCAINE 2% (20 MG/ML) 5 ML SYRINGE
INTRAMUSCULAR | Status: AC
  Filled 2021-04-18: qty 5

## 2021-04-18 MED ORDER — PHENYLEPHRINE 40 MCG/ML (10ML) SYRINGE FOR IV PUSH (FOR BLOOD PRESSURE SUPPORT)
PREFILLED_SYRINGE | INTRAVENOUS | Status: AC
  Filled 2021-04-18: qty 10

## 2021-04-18 MED ORDER — LIDOCAINE-EPINEPHRINE (PF) 1 %-1:200000 IJ SOLN
INTRAMUSCULAR | Status: DC | PRN
  Administered 2021-04-18: 8 mL

## 2021-04-18 MED ORDER — ONDANSETRON HCL 4 MG/2ML IJ SOLN: 4.0000 mg | Freq: Four times a day (QID) | INTRAMUSCULAR | Status: DC | PRN

## 2021-04-18 MED ORDER — BUPIVACAINE-EPINEPHRINE (PF) 0.5% -1:200000 IJ SOLN
INTRAMUSCULAR | Status: AC
  Filled 2021-04-18: qty 30

## 2021-04-18 MED ORDER — LIDOCAINE HCL 1 % IJ SOLN
INTRAMUSCULAR | Status: DC | PRN
  Administered 2021-04-18: 50 mg via INTRADERMAL

## 2021-04-18 MED ORDER — OXYCODONE HCL 5 MG PO TABS: 5.0000 mg | ORAL_TABLET | Freq: Once | ORAL | Status: DC | PRN

## 2021-04-18 MED ORDER — CEFAZOLIN SODIUM-DEXTROSE 2-4 GM/100ML-% IV SOLN
INTRAVENOUS | Status: AC
  Filled 2021-04-18: qty 100

## 2021-04-18 MED ORDER — PHENYLEPHRINE HCL (PRESSORS) 10 MG/ML IV SOLN
INTRAVENOUS | Status: DC | PRN
  Administered 2021-04-18 (×4): 80 ug via INTRAVENOUS

## 2021-04-18 MED ORDER — HEPARIN SOD (PORK) LOCK FLUSH 100 UNIT/ML IV SOLN
INTRAVENOUS | Status: AC
  Filled 2021-04-18: qty 5

## 2021-04-18 MED ORDER — LIDOCAINE-EPINEPHRINE (PF) 1 %-1:200000 IJ SOLN
INTRAMUSCULAR | Status: AC
  Filled 2021-04-18: qty 30

## 2021-04-18 MED ORDER — LACTATED RINGERS IV SOLN: INTRAVENOUS | Status: DC

## 2021-04-18 MED ORDER — HEPARIN SOD (PORK) LOCK FLUSH 100 UNIT/ML IV SOLN
INTRAVENOUS | Status: DC | PRN
  Administered 2021-04-18: 500 [IU] via INTRAVENOUS

## 2021-04-18 MED ORDER — FENTANYL CITRATE (PF) 100 MCG/2ML IJ SOLN
INTRAMUSCULAR | Status: AC
  Filled 2021-04-18: qty 2

## 2021-04-18 MED ORDER — PROPOFOL 10 MG/ML IV BOLUS
INTRAVENOUS | Status: DC | PRN
  Administered 2021-04-18: 150 mg via INTRAVENOUS

## 2021-04-18 MED ORDER — FENTANYL CITRATE (PF) 100 MCG/2ML IJ SOLN: 25.0000 ug | INTRAMUSCULAR | Status: DC | PRN

## 2021-04-18 MED FILL — Dexamethasone Sodium Phosphate Inj 100 MG/10ML: INTRAMUSCULAR | Qty: 1 | Status: AC

## 2021-04-18 MED FILL — Fosaprepitant Dimeglumine For IV Infusion 150 MG (Base Eq): INTRAVENOUS | Qty: 5 | Status: AC

## 2021-04-18 SURGICAL SUPPLY — 41 items
ADH SKN CLS APL DERMABOND .7 (GAUZE/BANDAGES/DRESSINGS) ×1
APL PRP STRL LF DISP 70% ISPRP (MISCELLANEOUS) ×1
BAG DECANTER FOR FLEXI CONT (MISCELLANEOUS) ×2 IMPLANT
BLADE HEX COATED 2.75 (ELECTRODE) ×2 IMPLANT
BLADE SURG 11 STRL SS (BLADE) ×2 IMPLANT
BLADE SURG 15 STRL LF DISP TIS (BLADE) ×1 IMPLANT
BLADE SURG 15 STRL SS (BLADE) ×2
CHLORAPREP W/TINT 26 (MISCELLANEOUS) ×2 IMPLANT
COVER BACK TABLE 60X90IN (DRAPES) ×2 IMPLANT
COVER MAYO STAND STRL (DRAPES) ×2 IMPLANT
DECANTER SPIKE VIAL GLASS SM (MISCELLANEOUS) IMPLANT
DERMABOND ADVANCED (GAUZE/BANDAGES/DRESSINGS) ×1
DERMABOND ADVANCED .7 DNX12 (GAUZE/BANDAGES/DRESSINGS) ×1 IMPLANT
DRAPE C-ARM 42X72 X-RAY (DRAPES) ×2 IMPLANT
DRAPE LAPAROTOMY TRNSV 102X78 (DRAPES) ×2 IMPLANT
DRAPE UTILITY XL STRL (DRAPES) ×2 IMPLANT
DRSG TEGADERM 4X4.75 (GAUZE/BANDAGES/DRESSINGS) IMPLANT
ELECT REM PT RETURN 9FT ADLT (ELECTROSURGICAL) ×2
ELECTRODE REM PT RTRN 9FT ADLT (ELECTROSURGICAL) ×1 IMPLANT
GAUZE SPONGE 4X4 12PLY STRL LF (GAUZE/BANDAGES/DRESSINGS) IMPLANT
GLOVE SURG ENC MOIS LTX SZ6 (GLOVE) ×2 IMPLANT
GLOVE SURG UNDER POLY LF SZ6.5 (GLOVE) ×2 IMPLANT
GOWN STRL REUS W/ TWL LRG LVL3 (GOWN DISPOSABLE) ×1 IMPLANT
GOWN STRL REUS W/TWL 2XL LVL3 (GOWN DISPOSABLE) ×2 IMPLANT
GOWN STRL REUS W/TWL LRG LVL3 (GOWN DISPOSABLE) ×2
IV CONNECTOR ONE LINK NDLESS (IV SETS) IMPLANT
KIT PORT POWER 8FR ISP CVUE (Port) ×1 IMPLANT
NDL HYPO 25X1 1.5 SAFETY (NEEDLE) ×1 IMPLANT
NEEDLE HYPO 25X1 1.5 SAFETY (NEEDLE) ×2 IMPLANT
PACK BASIN DAY SURGERY FS (CUSTOM PROCEDURE TRAY) ×2 IMPLANT
PENCIL SMOKE EVACUATOR (MISCELLANEOUS) ×2 IMPLANT
SLEEVE SCD COMPRESS KNEE MED (STOCKING) ×2 IMPLANT
SUT MNCRL AB 4-0 PS2 18 (SUTURE) ×2 IMPLANT
SUT PROLENE 2 0 SH DA (SUTURE) ×4 IMPLANT
SUT VIC AB 3-0 SH 27 (SUTURE) ×2
SUT VIC AB 3-0 SH 27X BRD (SUTURE) ×1 IMPLANT
SUT VICRYL 3-0 CR8 SH (SUTURE) IMPLANT
SYR 10ML LL (SYRINGE) ×2 IMPLANT
SYR 5ML LUER SLIP (SYRINGE) ×2 IMPLANT
SYR CONTROL 10ML LL (SYRINGE) ×2 IMPLANT
TOWEL GREEN STERILE FF (TOWEL DISPOSABLE) ×2 IMPLANT

## 2021-04-18 NOTE — Transfer of Care (Signed)
Immediate Anesthesia Transfer of Care Note  Patient: Forde Radon  Procedure(s) Performed: PORT PLACEMENT (Chest)  Patient Location: PACU  Anesthesia Type:General  Level of Consciousness: oriented, drowsy and patient cooperative  Airway & Oxygen Therapy: Patient connected to face mask oxygen  Post-op Assessment: Report given to RN and Post -op Vital signs reviewed and stable  Post vital signs: Reviewed and stable  Last Vitals:  Vitals Value Taken Time  BP 114/72 04/18/21 1343  Temp    Pulse 79 04/18/21 1346  Resp 19 04/18/21 1346  SpO2 100 % 04/18/21 1346  Vitals shown include unvalidated device data.  Last Pain:  Vitals:   04/18/21 1108  TempSrc: Oral  PainSc: 0-No pain         Complications: No notable events documented.

## 2021-04-18 NOTE — Anesthesia Preprocedure Evaluation (Signed)
Anesthesia Evaluation  Patient identified by MRN, date of birth, ID band Patient awake    Reviewed: Allergy & Precautions, H&P , NPO status , Patient's Chart, lab work & pertinent test results  Airway Mallampati: II   Neck ROM: full    Dental   Pulmonary neg pulmonary ROS,    breath sounds clear to auscultation       Cardiovascular + CAD, + Past MI and + Cardiac Stents  + dysrhythmias Atrial Fibrillation  Rhythm:regular Rate:Normal     Neuro/Psych CVA    GI/Hepatic Pancreatic CA   Endo/Other    Renal/GU      Musculoskeletal   Abdominal   Peds  Hematology   Anesthesia Other Findings   Reproductive/Obstetrics                             Anesthesia Physical Anesthesia Plan  ASA: 3  Anesthesia Plan: General   Post-op Pain Management:    Induction: Intravenous  PONV Risk Score and Plan: 2 and Ondansetron, Dexamethasone, Midazolam and Treatment may vary due to age or medical condition  Airway Management Planned: LMA  Additional Equipment:   Intra-op Plan:   Post-operative Plan: Extubation in OR  Informed Consent: I have reviewed the patients History and Physical, chart, labs and discussed the procedure including the risks, benefits and alternatives for the proposed anesthesia with the patient or authorized representative who has indicated his/her understanding and acceptance.     Dental advisory given  Plan Discussed with: CRNA, Anesthesiologist and Surgeon  Anesthesia Plan Comments:         Anesthesia Quick Evaluation

## 2021-04-18 NOTE — Op Note (Signed)
PREOPERATIVE DIAGNOSIS:  adenocarcinoma head of pancreas, uT3N1 c M0     POSTOPERATIVE DIAGNOSIS:  Same     PROCEDURE: Left subclavian port placement, Bard ClearVue Power Port, MRI safe, 8-French.      SURGEON:  Stark Klein, MD      ANESTHESIA:  General   FINDINGS:  Good venous return, easy flush, and tip of the catheter and   SVC 25 cm.      SPECIMEN:  None.      ESTIMATED BLOOD LOSS:  Minimal.      COMPLICATIONS:  None known.      PROCEDURE:  Pt was identified in the holding area and taken to   the operating room, where patient was placed supine on the operating room   table.  General anesthesia was induced.  Patient's arms were tucked and the upper   chest and neck were prepped and draped in sterile fashion.  Time-out was   performed according to the surgical safety check list.  When all was   correct, we continued.   Local anesthetic was administered over this   area at the angle of the clavicle.  The vein was accessed with 1 pass(es) of the needle. There was good venous return and the wire passed easily with no ectopy.   Fluoroscopy was used to confirm that the wire was in the vena cava.      The patient was placed back level and the area for the pocket was anethetized   with local anesthetic.  A 3-cm transverse incision was made with a #15   blade.  Cautery was used to divide the subcutaneous tissues down to the   pectoralis muscle.  An [employer] retractor was used to elevate the skin   while a pocket was created on top of the pectoralis fascia.  The port   was placed into the pocket to confirm that it was of adequate size.  The   catheter was preattached to the port.  The port was then secured to the   pectoralis fascia with four 2-0 Prolene sutures.  These were clamped and   not tied down yet.    The catheter was tunneled through to the wire exit   site.  The catheter was placed along the wire to determine what length it should be to be in the SVC.  The catheter was  cut at 25 cm.  The tunneler sheath and dilator were passed over the wire and the dilator and wire were removed.  The catheter was advanced through the tunneler sheath and the tunneler sheath was pulled away.  Care was taken to keep the catheter in the tunneler sheath as this occurred. This was advanced and the tunneler sheath was removed.  There was good venous   return and easy flush of the catheter.  The Prolene sutures were tied   down to the pectoral fascia.  The skin was reapproximated using 3-0   Vicryl interrupted deep dermal sutures.    Fluoroscopy was used to re-confirm good position of the catheter.  The skin   was then closed using 4-0 Monocryl in a subcuticular fashion.  The port was flushed with concentrated heparin flush as well.  The wounds were then cleaned, dried, and dressed with Dermabond.    The catheter was left accessed.  Gauze and tegaderm were placed around the access tubing.    The patient was awakened from anesthesia and taken to the PACU in stable condition.  Needle, sponge, and  instrument counts were correct.               Stark Klein, MD

## 2021-04-18 NOTE — Anesthesia Procedure Notes (Signed)
Procedure Name: LMA Insertion Date/Time: 04/18/2021 12:52 PM Performed by: Garrel Ridgel, CRNA Pre-anesthesia Checklist: Patient identified, Emergency Drugs available, Suction available and Patient being monitored Patient Re-evaluated:Patient Re-evaluated prior to induction Oxygen Delivery Method: Circle system utilized Preoxygenation: Pre-oxygenation with 100% oxygen Induction Type: IV induction Ventilation: Mask ventilation without difficulty LMA: LMA inserted LMA Size: 4.0 Number of attempts: 1 Placement Confirmation: positive ETCO2 Tube secured with: Tape Dental Injury: Teeth and Oropharynx as per pre-operative assessment

## 2021-04-18 NOTE — Interval H&P Note (Signed)
History and Physical Interval Note:  04/18/2021 12:14 PM  Casey Pierce.  has presented today for surgery, with the diagnosis of PANCREATIC CANCER.  The various methods of treatment have been discussed with the patient and family. After consideration of risks, benefits and other options for treatment, the patient has consented to  Procedure(s): PORT PLACEMENT (N/A) as a surgical intervention.  The patient's history has been reviewed, patient examined, no change in status, stable for surgery.  I have reviewed the patient's chart and labs.  Questions were answered to the patient's satisfaction.     Stark Klein

## 2021-04-18 NOTE — Discharge Instructions (Addendum)
You may have Tylenol after 5pm today, if needed.  Post Anesthesia Home Care Instructions  Activity: Get plenty of rest for the remainder of the day. A responsible individual must stay with you for 24 hours following the procedure.  For the next 24 hours, DO NOT: -Drive a car -Paediatric nurse -Drink alcoholic beverages -Take any medication unless instructed by your physician -Make any legal decisions or sign important papers.  Meals: Start with liquid foods such as gelatin or soup. Progress to regular foods as tolerated. Avoid greasy, spicy, heavy foods. If nausea and/or vomiting occur, drink only clear liquids until the nausea and/or vomiting subsides. Call your physician if vomiting continues.  Special Instructions/Symptoms: Your throat may feel dry or sore from the anesthesia or the breathing tube placed in your throat during surgery. If this causes discomfort, gargle with warm salt water. The discomfort should disappear within 24 hours.  If you had a scopolamine patch placed behind your ear for the management of post- operative nausea and/or vomiting:  1. The medication in the patch is effective for 72 hours, after which it should be removed.  Wrap patch in a tissue and discard in the trash. Wash hands thoroughly with soap and water. 2. You may remove the patch earlier than 72 hours if you experience unpleasant side effects which may include dry mouth, dizziness or visual disturbances. 3. Avoid touching the patch. Wash your hands with soap and water after contact with the patch.    Restart eliquis PM 04/19/21.    [place] Office Phone Number [phone]   POST OP INSTRUCTIONS  Always review your discharge instruction sheet given to you by the facility where your surgery was performed.  IF YOU HAVE DISABILITY OR FAMILY LEAVE FORMS, YOU MUST BRING THEM TO THE OFFICE FOR PROCESSING.  DO NOT GIVE THEM TO YOUR DOCTOR.  A prescription for pain medication may be  given to you upon discharge.  Take your pain medication as prescribed, if needed.  If narcotic pain medicine is not needed, then you may take acetaminophen (Tylenol) or ibuprofen (Advil) as needed. Take your usually prescribed medications unless otherwise directed If you need a refill on your pain medication, please contact your pharmacy.  They will contact our office to request authorization.  Prescriptions will not be filled after 5pm or on week-ends. You should eat very light the first 24 hours after surgery, such as soup, crackers, pudding, etc.  Resume your normal diet the day after surgery It is common to experience some constipation if taking pain medication after surgery.  Increasing fluid intake and taking a stool softener will usually help or prevent this problem from occurring.  A mild laxative (Milk of Magnesia or Miralax) should be taken according to package directions if there are no bowel movements after 48 hours. You may shower after the tubing is removed.  The surgical glue will flake off in 2-3 weeks.   ACTIVITIES:  No strenuous activity or heavy lifting for 1 week.   You may drive when you no longer are taking prescription pain medication, you can comfortably wear a seatbelt, and you can safely maneuver your car and apply brakes. RETURN TO WORK:  __________to be determined.  _______________ Dennis Bast should see your doctor in the office for a follow-up appointment approximately three-four weeks after your surgery.    WHEN TO CALL YOUR DOCTOR: Fever over 101.0 Nausea and/or vomiting. Extreme swelling or bruising. Continued bleeding from incision. Increased pain, redness, or drainage from the  incision.  The clinic staff is available to answer your questions during regular business hours.  Please dont hesitate to call and ask to speak to one of the nurses for clinical concerns.  If you have a medical emergency, go to the nearest emergency room or call 911.  A surgeon from [facility] is always on call at the hospital.  For further questions, please visit [email or web address]

## 2021-04-19 ENCOUNTER — Inpatient Hospital Stay: Payer: Medicare PPO | Attending: Physician Assistant

## 2021-04-19 ENCOUNTER — Inpatient Hospital Stay: Payer: Medicare PPO | Admitting: Hematology

## 2021-04-19 ENCOUNTER — Encounter: Payer: Self-pay | Admitting: Hematology

## 2021-04-19 ENCOUNTER — Encounter (HOSPITAL_BASED_OUTPATIENT_CLINIC_OR_DEPARTMENT_OTHER): Payer: Self-pay | Admitting: General Surgery

## 2021-04-19 ENCOUNTER — Other Ambulatory Visit: Payer: Self-pay

## 2021-04-19 ENCOUNTER — Inpatient Hospital Stay: Payer: Medicare PPO

## 2021-04-19 ENCOUNTER — Telehealth: Payer: Self-pay

## 2021-04-19 VITALS — BP 136/80 | HR 90 | Temp 98.1°F | Ht 65.0 in | Wt 162.5 lb

## 2021-04-19 DIAGNOSIS — C250 Malignant neoplasm of head of pancreas: Secondary | ICD-10-CM | POA: Diagnosis not present

## 2021-04-19 DIAGNOSIS — Z5111 Encounter for antineoplastic chemotherapy: Secondary | ICD-10-CM | POA: Insufficient documentation

## 2021-04-19 DIAGNOSIS — R110 Nausea: Secondary | ICD-10-CM | POA: Diagnosis not present

## 2021-04-19 DIAGNOSIS — I252 Old myocardial infarction: Secondary | ICD-10-CM | POA: Insufficient documentation

## 2021-04-19 DIAGNOSIS — Z8673 Personal history of transient ischemic attack (TIA), and cerebral infarction without residual deficits: Secondary | ICD-10-CM | POA: Diagnosis not present

## 2021-04-19 DIAGNOSIS — I4891 Unspecified atrial fibrillation: Secondary | ICD-10-CM | POA: Insufficient documentation

## 2021-04-19 DIAGNOSIS — Z79899 Other long term (current) drug therapy: Secondary | ICD-10-CM | POA: Diagnosis not present

## 2021-04-19 DIAGNOSIS — Z7901 Long term (current) use of anticoagulants: Secondary | ICD-10-CM | POA: Insufficient documentation

## 2021-04-19 DIAGNOSIS — I2510 Atherosclerotic heart disease of native coronary artery without angina pectoris: Secondary | ICD-10-CM | POA: Diagnosis not present

## 2021-04-19 LAB — CMP (CANCER CENTER ONLY)
ALT: 36 U/L (ref 0–44)
AST: 19 U/L (ref 15–41)
Albumin: 3.7 g/dL (ref 3.5–5.0)
Alkaline Phosphatase: 90 U/L (ref 38–126)
Anion gap: 7 (ref 5–15)
BUN: 11 mg/dL (ref 8–23)
CO2: 29 mmol/L (ref 22–32)
Calcium: 9.3 mg/dL (ref 8.9–10.3)
Chloride: 103 mmol/L (ref 98–111)
Creatinine: 0.63 mg/dL (ref 0.61–1.24)
GFR, Estimated: >60 mL/min (ref >60)
Glucose, Bld: 96 mg/dL (ref 70–99)
Potassium: 3.4 mmol/L — ABNORMAL LOW (ref 3.5–5.1)
Sodium: 139 mmol/L (ref 135–145)
Total Bilirubin: 3.8 mg/dL (ref 0.3–1.2)
Total Protein: 6.7 g/dL (ref 6.5–8.1)

## 2021-04-19 LAB — CBC WITH DIFFERENTIAL (CANCER CENTER ONLY)
Abs Immature Granulocytes: 0.13 10*3/uL — ABNORMAL HIGH (ref 0.00–0.07)
Basophils Absolute: 0 10*3/uL (ref 0.0–0.1)
Basophils Relative: 0 %
Eosinophils Absolute: 0 10*3/uL (ref 0.0–0.5)
Eosinophils Relative: 0 %
HCT: 38.8 % — ABNORMAL LOW (ref 39.0–52.0)
Hemoglobin: 13.2 g/dL (ref 13.0–17.0)
Immature Granulocytes: 1 %
Lymphocytes Relative: 11 %
Lymphs Abs: 2.4 10*3/uL (ref 0.7–4.0)
MCH: 31.3 pg (ref 26.0–34.0)
MCHC: 34 g/dL (ref 30.0–36.0)
MCV: 91.9 fL (ref 80.0–100.0)
Monocytes Absolute: 1.5 10*3/uL — ABNORMAL HIGH (ref 0.1–1.0)
Monocytes Relative: 7 %
Neutro Abs: 18.1 10*3/uL — ABNORMAL HIGH (ref 1.7–7.7)
Neutrophils Relative %: 81 %
Platelet Count: 263 10*3/uL (ref 150–400)
RBC: 4.22 MIL/uL (ref 4.22–5.81)
RDW: 15.5 % (ref 11.5–15.5)
WBC Count: 22.2 10*3/uL — ABNORMAL HIGH (ref 4.0–10.5)
nRBC: 0 % (ref 0.0–0.2)

## 2021-04-19 MED ORDER — OXALIPLATIN CHEMO INJECTION 100 MG/20ML
85.0000 mg/m2 | Freq: Once | INTRAVENOUS | Status: AC
  Administered 2021-04-19: 155 mg via INTRAVENOUS
  Filled 2021-04-19: qty 31

## 2021-04-19 MED ORDER — SODIUM CHLORIDE 0.9 % IV SOLN
10.0000 mg | Freq: Once | INTRAVENOUS | Status: AC
  Administered 2021-04-19: 10 mg via INTRAVENOUS
  Filled 2021-04-19: qty 10

## 2021-04-19 MED ORDER — DEXTROSE 5 % IV SOLN: Freq: Once | INTRAVENOUS | Status: AC

## 2021-04-19 MED ORDER — PALONOSETRON HCL INJECTION 0.25 MG/5ML
0.2500 mg | Freq: Once | INTRAVENOUS | Status: AC
  Administered 2021-04-19: 0.25 mg via INTRAVENOUS
  Filled 2021-04-19: qty 5

## 2021-04-19 MED ORDER — LEUCOVORIN CALCIUM INJECTION 350 MG
400.0000 mg/m2 | Freq: Once | INTRAVENOUS | Status: AC
  Administered 2021-04-19: 724 mg via INTRAVENOUS
  Filled 2021-04-19: qty 36.2

## 2021-04-19 MED ORDER — SODIUM CHLORIDE 0.9 % IV SOLN
2400.0000 mg/m2 | INTRAVENOUS | Status: DC
  Administered 2021-04-19: 4350 mg via INTRAVENOUS
  Filled 2021-04-19: qty 50

## 2021-04-19 MED ORDER — SODIUM CHLORIDE 0.9 % IV SOLN
150.0000 mg | Freq: Once | INTRAVENOUS | Status: AC
  Administered 2021-04-19: 150 mg via INTRAVENOUS
  Filled 2021-04-19: qty 150

## 2021-04-19 NOTE — Progress Notes (Signed)
Ok to treat with bili 3.8 per Dr Burr Medico

## 2021-04-19 NOTE — Progress Notes (Signed)
Called pt to introduce myself as his Arboriculturist and to discuss copay assistance.  Pt requested I speak to his wife so I informed her of copay assistance that's available.  She gave me consent to apply in the pt's behalf so I applied to the [employer] and he was approved for $4,500 from 04/19/21 to 04/19/22 for Eloxatin, Leucovorin, Camptosar and Adrucil.  Pt is overqualified for the [employer].

## 2021-04-19 NOTE — Anesthesia Postprocedure Evaluation (Signed)
Anesthesia Post Note  Patient: Casey Pierce.  Procedure(s) Performed: PORT PLACEMENT (Chest)     Patient location during evaluation: PACU Anesthesia Type: General Level of consciousness: awake and alert Pain management: pain level controlled Vital Signs Assessment: post-procedure vital signs reviewed and stable Respiratory status: spontaneous breathing, nonlabored ventilation, respiratory function stable and patient connected to nasal cannula oxygen Cardiovascular status: blood pressure returned to baseline and stable Postop Assessment: no apparent nausea or vomiting Anesthetic complications: no   No notable events documented.  Last Vitals:  Vitals:   04/18/21 1345 04/18/21 1424  BP: 115/77 111/71  Pulse: 73 80  Resp: 12 18  Temp:  (!) 36.4 C  SpO2: 100% 98%    Last Pain:  Vitals:   04/19/21 1047  TempSrc:   PainSc: [name]

## 2021-04-19 NOTE — Patient Instructions (Signed)

## 2021-04-19 NOTE — Progress Notes (Signed)
OK to treat w/ Tbili = 3.8 today. Dr. Burr Medico aware and has removed Irinotecan today.  Kennith Center, Pharm.D., CPP 04/19/2021@12 :32 PM

## 2021-04-19 NOTE — Telephone Encounter (Signed)
CRITICAL VALUE STICKER  CRITICAL VALUE: Total Bilirubin  3.8  RECEIVER (on-site recipient of call): Laureano Hetzer  DATE & TIME NOTIFIED: 04/19/21 12:03 pm  MESSENGER (representative from lab): Ulice Dash  MD NOTIFIED: Dr. Burr Medico

## 2021-04-19 NOTE — Patient Instructions (Signed)
[facility]  Discharge Instructions: Thank you for choosing [facility] to provide your oncology and hematology care.   If you have a lab appointment with the [facility], please go directly to the [facility] and check in at the registration area.   Wear comfortable clothing and clothing appropriate for easy access to any Portacath or PICC line.   We strive to give you quality time with your provider. You may need to reschedule your appointment if you arrive late (15 or more minutes).  Arriving late affects you and other patients whose appointments are after yours.  Also, if you miss three or more appointments without notifying the office, you may be dismissed from the clinic at the providers discretion.      For prescription refill requests, have your pharmacy contact our office and allow 72 hours for refills to be completed.    Today you received the following chemotherapy and/or immunotherapy agents: Oxaliplatin and Fluorouracil      To help prevent nausea and vomiting after your treatment, we encourage you to take your nausea medication as directed.  BELOW ARE SYMPTOMS THAT SHOULD BE REPORTED IMMEDIATELY: *FEVER GREATER THAN 100.4 F (38 C) OR HIGHER *CHILLS OR SWEATING *NAUSEA AND VOMITING THAT IS NOT CONTROLLED WITH YOUR NAUSEA MEDICATION *UNUSUAL SHORTNESS OF BREATH *UNUSUAL BRUISING OR BLEEDING *URINARY PROBLEMS (pain or burning when urinating, or frequent urination) *BOWEL PROBLEMS (unusual diarrhea, constipation, pain near the anus) TENDERNESS IN MOUTH AND THROAT WITH OR WITHOUT PRESENCE OF ULCERS (sore throat, sores in mouth, or a toothache) UNUSUAL RASH, SWELLING OR PAIN  UNUSUAL VAGINAL DISCHARGE OR ITCHING   Items with * indicate a potential emergency and should be followed up as soon as possible or go to the Emergency Department if any problems should occur.  Please show the CHEMOTHERAPY ALERT CARD or IMMUNOTHERAPY ALERT  CARD at check-in to the Emergency Department and triage nurse.  Should you have questions after your visit or need to cancel or reschedule your appointment, please contact [facility]  Dept: [phone]  and follow the prompts.  Office hours are 8:00 a.m. to 4:30 p.m. Monday - Friday. Please note that voicemails left after 4:00 p.m. may not be returned until the following business day.  We are closed weekends and major holidays. You have access to a nurse at all times for urgent questions. Please call the main number to the clinic Dept: [phone] and follow the prompts.   For any non-urgent questions, you may also contact your provider using MyChart. We now offer e-Visits for anyone 44 and older to request care online for non-urgent symptoms. For details visit [email or web address].   Also download the MyChart app! Go to the app store, search "MyChart", open the app, select [facility], and log in with your MyChart username and password.  Due to Covid, a mask is required upon entering the hospital/clinic. If you do not have a mask, one will be given to you upon arrival. For doctor visits, patients may have 1 support person aged 28 or older with them. For treatment visits, patients cannot have anyone with them due to current Covid guidelines and our immunocompromised population.   Oxaliplatin Injection What is this medication? OXALIPLATIN (ox AL i PLA tin) is a chemotherapy drug. It targets fast dividing cells, like cancer cells, and causes these cells to die. This medicine is used to treat cancers of the colon and rectum, and many other cancers. This  medicine may be used for other purposes; ask your health care provider or pharmacist if you have questions. COMMON BRAND NAME(S): Eloxatin What should I tell my care team before I take this medication? They need to know if you have any of these conditions: heart disease history of irregular heartbeat liver  disease low blood counts, like white cells, platelets, or red blood cells lung or breathing disease, like asthma take medicines that treat or prevent blood clots tingling of the fingers or toes, or other nerve disorder an unusual or allergic reaction to oxaliplatin, other chemotherapy, other medicines, foods, dyes, or preservatives pregnant or trying to get pregnant breast-feeding How should I use this medication? This drug is given as an infusion into a vein. It is administered in a hospital or clinic by a specially trained health care professional. Talk to your pediatrician regarding the use of this medicine in children. Special care may be needed. Overdosage: If you think you have taken too much of this medicine contact a poison control center or emergency room at once. NOTE: This medicine is only for you. Do not share this medicine with others. What if I miss a dose? It is important not to miss a dose. Call your doctor or health care professional if you are unable to keep an appointment. What may interact with this medication? Do not take this medicine with any of the following medications: cisapride dronedarone pimozide thioridazine This medicine may also interact with the following medications: aspirin and aspirin-like medicines certain medicines that treat or prevent blood clots like warfarin, apixaban, dabigatran, and rivaroxaban cisplatin cyclosporine diuretics medicines for infection like acyclovir, adefovir, amphotericin B, bacitracin, cidofovir, foscarnet, ganciclovir, gentamicin, pentamidine, vancomycin NSAIDs, medicines for pain and inflammation, like ibuprofen or naproxen other medicines that prolong the QT interval (an abnormal heart rhythm) pamidronate zoledronic acid This list may not describe all possible interactions. Give your health care provider a list of all the medicines, herbs, non-prescription drugs, or dietary supplements you use. Also tell them if you  smoke, drink alcohol, or use illegal drugs. Some items may interact with your medicine. What should I watch for while using this medication? Your condition will be monitored carefully while you are receiving this medicine. You may need blood work done while you are taking this medicine. This medicine may make you feel generally unwell. This is not uncommon as chemotherapy can affect healthy cells as well as cancer cells. Report any side effects. Continue your course of treatment even though you feel ill unless your healthcare professional tells you to stop. This medicine can make you more sensitive to cold. Do not drink cold drinks or use ice. Cover exposed skin before coming in contact with cold temperatures or cold objects. When out in cold weather wear warm clothing and cover your mouth and nose to warm the air that goes into your lungs. Tell your doctor if you get sensitive to the cold. Do not become pregnant while taking this medicine or for 9 months after stopping it. Women should inform their health care professional if they wish to become pregnant or think they might be pregnant. Men should not father a child while taking this medicine and for 6 months after stopping it. There is potential for serious side effects to an unborn child. Talk to your health care professional for more information. Do not breast-feed a child while taking this medicine or for 3 months after stopping it. This medicine has caused ovarian failure in some women. This medicine  may make it more difficult to get pregnant. Talk to your health care professional if you are concerned about your fertility. This medicine has caused decreased sperm counts in some men. This may make it more difficult to father a child. Talk to your health care professional if you are concerned about your fertility. This medicine may increase your risk of getting an infection. Call your health care professional for advice if you get a fever, chills, or  sore throat, or other symptoms of a cold or flu. Do not treat yourself. Try to avoid being around people who are sick. Avoid taking medicines that contain aspirin, acetaminophen, ibuprofen, naproxen, or ketoprofen unless instructed by your health care professional. These medicines may hide a fever. Be careful brushing or flossing your teeth or using a toothpick because you may get an infection or bleed more easily. If you have any dental work done, tell your dentist you are receiving this medicine. What side effects may I notice from receiving this medication? Side effects that you should report to your doctor or health care professional as soon as possible: allergic reactions like skin rash, itching or hives, swelling of the face, lips, or tongue breathing problems cough low blood counts - this medicine may decrease the number of white blood cells, red blood cells, and platelets. You may be at increased risk for infections and bleeding nausea, vomiting pain, redness, or irritation at site where injected pain, tingling, numbness in the hands or feet signs and symptoms of bleeding such as bloody or black, tarry stools; red or dark brown urine; spitting up blood or brown material that looks like coffee grounds; red spots on the skin; unusual bruising or bleeding from the eyes, gums, or nose signs and symptoms of a dangerous change in heartbeat or heart rhythm like chest pain; dizziness; fast, irregular heartbeat; palpitations; feeling faint or lightheaded; falls signs and symptoms of infection like fever; chills; cough; sore throat; pain or trouble passing urine signs and symptoms of liver injury like dark yellow or brown urine; general ill feeling or flu-like symptoms; light-colored stools; loss of appetite; nausea; right upper belly pain; unusually weak or tired; yellowing of the eyes or skin signs and symptoms of low red blood cells or anemia such as unusually weak or tired; feeling faint or  lightheaded; falls signs and symptoms of muscle injury like dark urine; trouble passing urine or change in the amount of urine; unusually weak or tired; muscle pain; back pain Side effects that usually do not require medical attention (report to your doctor or health care professional if they continue or are bothersome): changes in taste diarrhea gas hair loss loss of appetite mouth sores This list may not describe all possible side effects. Call your doctor for medical advice about side effects. You may report side effects to FDA at 1-800-FDA-1088. Where should I keep my medication? This drug is given in a hospital or clinic and will not be stored at home. NOTE: This sheet is a summary. It may not cover all possible information. If you have questions about this medicine, talk to your doctor, pharmacist, or health care provider.  2022 Elsevier/Gold Standard (2020-11-22 00:00:00)  Fluorouracil, 5-FU injection What is this medication? FLUOROURACIL, 5-FU (flure oh YOOR a sil) is a chemotherapy drug. It slows the growth of cancer cells. This medicine is used to treat many types of cancer like breast cancer, colon or rectal cancer, pancreatic cancer, and stomach cancer. This medicine may be used for other purposes;  ask your health care provider or pharmacist if you have questions. COMMON BRAND NAME(S): Adrucil What should I tell my care team before I take this medication? They need to know if you have any of these conditions: blood disorders dihydropyrimidine dehydrogenase (DPD) deficiency infection (especially a virus infection such as chickenpox, cold sores, or herpes) kidney disease liver disease malnourished, poor nutrition recent or ongoing radiation therapy an unusual or allergic reaction to fluorouracil, other chemotherapy, other medicines, foods, dyes, or preservatives pregnant or trying to get pregnant breast-feeding How should I use this medication? This drug is given as an  infusion or injection into a vein. It is administered in a hospital or clinic by a specially trained health care professional. Talk to your pediatrician regarding the use of this medicine in children. Special care may be needed. Overdosage: If you think you have taken too much of this medicine contact a poison control center or emergency room at once. NOTE: This medicine is only for you. Do not share this medicine with others. What if I miss a dose? It is important not to miss your dose. Call your doctor or health care professional if you are unable to keep an appointment. What may interact with this medication? Do not take this medicine with any of the following medications: live virus vaccines This medicine may also interact with the following medications: medicines that treat or prevent blood clots like warfarin, enoxaparin, and dalteparin This list may not describe all possible interactions. Give your health care provider a list of all the medicines, herbs, non-prescription drugs, or dietary supplements you use. Also tell them if you smoke, drink alcohol, or use illegal drugs. Some items may interact with your medicine. What should I watch for while using this medication? Visit your doctor for checks on your progress. This drug may make you feel generally unwell. This is not uncommon, as chemotherapy can affect healthy cells as well as cancer cells. Report any side effects. Continue your course of treatment even though you feel ill unless your doctor tells you to stop. In some cases, you may be given additional medicines to help with side effects. Follow all directions for their use. Call your doctor or health care professional for advice if you get a fever, chills or sore throat, or other symptoms of a cold or flu. Do not treat yourself. This drug decreases your body's ability to fight infections. Try to avoid being around people who are sick. This medicine may increase your risk to bruise or  bleed. Call your doctor or health care professional if you notice any unusual bleeding. Be careful brushing and flossing your teeth or using a toothpick because you may get an infection or bleed more easily. If you have any dental work done, tell your dentist you are receiving this medicine. Avoid taking products that contain aspirin, acetaminophen, ibuprofen, naproxen, or ketoprofen unless instructed by your doctor. These medicines may hide a fever. Do not become pregnant while taking this medicine. Women should inform their doctor if they wish to become pregnant or think they might be pregnant. There is a potential for serious side effects to an unborn child. Talk to your health care professional or pharmacist for more information. Do not breast-feed an infant while taking this medicine. Men should inform their doctor if they wish to father a child. This medicine may lower sperm counts. Do not treat diarrhea with over the counter products. Contact your doctor if you have diarrhea that lasts more than 2  days or if it is severe and watery. This medicine can make you more sensitive to the sun. Keep out of the sun. If you cannot avoid being in the sun, wear protective clothing and use sunscreen. Do not use sun lamps or tanning beds/booths. What side effects may I notice from receiving this medication? Side effects that you should report to your doctor or health care professional as soon as possible: allergic reactions like skin rash, itching or hives, swelling of the face, lips, or tongue low blood counts - this medicine may decrease the number of white blood cells, red blood cells and platelets. You may be at increased risk for infections and bleeding. signs of infection - fever or chills, cough, sore throat, pain or difficulty passing urine signs of decreased platelets or bleeding - bruising, pinpoint red spots on the skin, black, tarry stools, blood in the urine signs of decreased red blood cells -  unusually weak or tired, fainting spells, lightheadedness breathing problems changes in vision chest pain mouth sores nausea and vomiting pain, swelling, redness at site where injected pain, tingling, numbness in the hands or feet redness, swelling, or sores on hands or feet stomach pain unusual bleeding Side effects that usually do not require medical attention (report to your doctor or health care professional if they continue or are bothersome): changes in finger or toe nails diarrhea dry or itchy skin hair loss headache loss of appetite sensitivity of eyes to the light stomach upset unusually teary eyes This list may not describe all possible side effects. Call your doctor for medical advice about side effects. You may report side effects to FDA at 1-800-FDA-1088. Where should I keep my medication? This drug is given in a hospital or clinic and will not be stored at home. NOTE: This sheet is a summary. It may not cover all possible information. If you have questions about this medicine, talk to your doctor, pharmacist, or health care provider.  2022 Elsevier/Gold Standard (2020-11-22 00:00:00)  Irinotecan injection-- Not given today 04/19/2021 (Dr Burr Medico removed from plan today) What is this medication? IRINOTECAN (ir in oh TEE kan ) is a chemotherapy drug. It is used to treat colon and rectal cancer. This medicine may be used for other purposes; ask your health care provider or pharmacist if you have questions. COMMON BRAND NAME(S): Camptosar What should I tell my care team before I take this medication? They need to know if you have any of these conditions: dehydration diarrhea infection (especially a virus infection such as chickenpox, cold sores, or herpes) liver disease low blood counts, like low white cell, platelet, or red cell counts low levels of calcium, magnesium, or potassium in the blood recent or ongoing radiation therapy an unusual or allergic reaction to  irinotecan, other medicines, foods, dyes, or preservatives pregnant or trying to get pregnant breast-feeding How should I use this medication? This drug is given as an infusion into a vein. It is administered in a hospital or clinic by a specially trained health care professional. Talk to your pediatrician regarding the use of this medicine in children. Special care may be needed. Overdosage: If you think you have taken too much of this medicine contact a poison control center or emergency room at once. NOTE: This medicine is only for you. Do not share this medicine with others. What if I miss a dose? It is important not to miss your dose. Call your doctor or health care professional if you are unable to keep an  appointment. What may interact with this medication? Do not take this medicine with any of the following medications: cobicistat itraconazole This medicine may interact with the following medications: antiviral medicines for HIV or AIDS certain antibiotics like rifampin or rifabutin certain medicines for fungal infections like ketoconazole, posaconazole, and voriconazole certain medicines for seizures like carbamazepine, phenobarbital, phenotoin clarithromycin gemfibrozil nefazodone St. John's Wort This list may not describe all possible interactions. Give your health care provider a list of all the medicines, herbs, non-prescription drugs, or dietary supplements you use. Also tell them if you smoke, drink alcohol, or use illegal drugs. Some items may interact with your medicine. What should I watch for while using this medication? Your condition will be monitored carefully while you are receiving this medicine. You will need important blood work done while you are taking this medicine. This drug may make you feel generally unwell. This is not uncommon, as chemotherapy can affect healthy cells as well as cancer cells. Report any side effects. Continue your course of treatment even  though you feel ill unless your doctor tells you to stop. In some cases, you may be given additional medicines to help with side effects. Follow all directions for their use. You may get drowsy or dizzy. Do not drive, use machinery, or do anything that needs mental alertness until you know how this medicine affects you. Do not stand or sit up quickly, especially if you are an older patient. This reduces the risk of dizzy or fainting spells. Call your health care professional for advice if you get a fever, chills, or sore throat, or other symptoms of a cold or flu. Do not treat yourself. This medicine decreases your body's ability to fight infections. Try to avoid being around people who are sick. Avoid taking products that contain aspirin, acetaminophen, ibuprofen, naproxen, or ketoprofen unless instructed by your doctor. These medicines may hide a fever. This medicine may increase your risk to bruise or bleed. Call your doctor or health care professional if you notice any unusual bleeding. Be careful brushing and flossing your teeth or using a toothpick because you may get an infection or bleed more easily. If you have any dental work done, tell your dentist you are receiving this medicine. Do not become pregnant while taking this medicine or for 6 months after stopping it. Women should inform their health care professional if they wish to become pregnant or think they might be pregnant. Men should not father a child while taking this medicine and for 3 months after stopping it. There is potential for serious side effects to an unborn child. Talk to your health care professional for more information. Do not breast-feed an infant while taking this medicine or for 7 days after stopping it. This medicine has caused ovarian failure in some women. This medicine may make it more difficult to get pregnant. Talk to your health care professional if you are concerned about your fertility. This medicine has caused  decreased sperm counts in some men. This may make it more difficult to father a child. Talk to your health care professional if you are concerned about your fertility. What side effects may I notice from receiving this medication? Side effects that you should report to your doctor or health care professional as soon as possible: allergic reactions like skin rash, itching or hives, swelling of the face, lips, or tongue chest pain diarrhea flushing, runny nose, sweating during infusion low blood counts - this medicine may decrease the number of  white blood cells, red blood cells and platelets. You may be at increased risk for infections and bleeding. nausea, vomiting pain, swelling, warmth in the leg signs of decreased platelets or bleeding - bruising, pinpoint red spots on the skin, black, tarry stools, blood in the urine signs of infection - fever or chills, cough, sore throat, pain or difficulty passing urine signs of decreased red blood cells - unusually weak or tired, fainting spells, lightheadedness Side effects that usually do not require medical attention (report to your doctor or health care professional if they continue or are bothersome): constipation hair loss headache loss of appetite mouth sores stomach pain This list may not describe all possible side effects. Call your doctor for medical advice about side effects. You may report side effects to FDA at 1-800-FDA-1088. Where should I keep my medication? This drug is given in a hospital or clinic and will not be stored at home. NOTE: This sheet is a summary. It may not cover all possible information. If you have questions about this medicine, talk to your doctor, pharmacist, or health care provider.  2022 Elsevier/Gold Standard (2020-11-22 00:00:00)  Leucovorin injection What is this medication? LEUCOVORIN (loo koe VOR in) is used to prevent or treat the harmful effects of some medicines. This medicine is used to treat anemia  caused by a low amount of folic acid in the body. It is also used with 5-fluorouracil (5-FU) to treat colon cancer. This medicine may be used for other purposes; ask your health care provider or pharmacist if you have questions. What should I tell my care team before I take this medication? They need to know if you have any of these conditions: anemia from low levels of vitamin B-12 in the blood an unusual or allergic reaction to leucovorin, folic acid, other medicines, foods, dyes, or preservatives pregnant or trying to get pregnant breast-feeding How should I use this medication? This medicine is for injection into a muscle or into a vein. It is given by a health care professional in a hospital or clinic setting. Talk to your pediatrician regarding the use of this medicine in children. Special care may be needed. Overdosage: If you think you have taken too much of this medicine contact a poison control center or emergency room at once. NOTE: This medicine is only for you. Do not share this medicine with others. What if I miss a dose? This does not apply. What may interact with this medication? capecitabine fluorouracil phenobarbital phenytoin primidone trimethoprim-sulfamethoxazole This list may not describe all possible interactions. Give your health care provider a list of all the medicines, herbs, non-prescription drugs, or dietary supplements you use. Also tell them if you smoke, drink alcohol, or use illegal drugs. Some items may interact with your medicine. What should I watch for while using this medication? Your condition will be monitored carefully while you are receiving this medicine. This medicine may increase the side effects of 5-fluorouracil, 5-FU. Tell your doctor or health care professional if you have diarrhea or mouth sores that do not get better or that get worse. What side effects may I notice from receiving this medication? Side effects that you should report to  your doctor or health care professional as soon as possible: allergic reactions like skin rash, itching or hives, swelling of the face, lips, or tongue breathing problems fever, infection mouth sores unusual bleeding or bruising unusually weak or tired Side effects that usually do not require medical attention (report to your doctor or  health care professional if they continue or are bothersome): constipation or diarrhea loss of appetite nausea, vomiting This list may not describe all possible side effects. Call your doctor for medical advice about side effects. You may report side effects to FDA at 1-800-FDA-1088. Where should I keep my medication? This drug is given in a hospital or clinic and will not be stored at home. NOTE: This sheet is a summary. It may not cover all possible information. If you have questions about this medicine, talk to your doctor, pharmacist, or health care provider.  2022 Elsevier/Gold Standard (2007-09-11 00:00:00)

## 2021-04-19 NOTE — Progress Notes (Signed)
[place]   [phone] Fax:(336) [phone]   Clinic Follow up Note   Patient Care Team: Lavone Orn, MD as PCP - General (Internal Medicine) Deboraha Sprang, MD as PCP - Cardiology (Cardiology) Truitt Merle, MD as Consulting Physician (Oncology) Royston Bake, RN as Oncology Nurse Navigator (Oncology)  Date of Service:  04/19/2021  CHIEF COMPLAINT: f/u of pancreatic cancer  CURRENT THERAPY:  Neoadjuvant FOLFIRINOX, q2weeks, starting 04/19/21  ASSESSMENT & PLAN:  Casey Pierce. is a 66 y.o. male with   1. Pancreatic adenocarcinoma, Stage IIB, p(T2 N1) -presented with painless obstructive jaundice in 02/2021. RUQ ultrasound and subsequently MRI of the abdomen 03/07/21 showed a 2.6 x 2.2 x 3.3 cm mass in the head of the pancreas causing obstruction of the distal common bile duct resulting in intra and extrahepatic biliary duct dilation. -ERCP and EUS with stent placement performed on 03/28/21, showed 2.5 cm mass, no evidence of invasion. FNA of the pancreatic head lesion showed malignant cells consistent with adenocarcinoma. -he was seen by Dr. Barry Dienes on 04/10/21, who recommend neoadjuvant chemo followed by Whipple procedure. -staging chest CT 04/17/21 showed nonspecific small (4 mm or less) pulmonary nodules. I reviewed the results with them today. -port placed 04/18/21. He is scheduled to begin neoadjuvant FOLFIRINOX today, 04/19/21. We reviewed side effects and prevention again today. -baseline CA 19-9 obtained today is pending.   2. Genetics -appointment with genetic counselor scheduled for 05/01/21   3. Comorbidities -Patient has history of CVA in 2016 -The patient has some numbness in his right palm, but is functional with his activities of daily living without any significant debility.     PLAN: -proceed with first FOLFIRINOX today, due to tbil 3.8 today, will hold on irinotecan, GCSF injection on day 3  -phone visit in one week for toxicity  check -lab, flush, f/u, and FOLFIRINOX every 2 weeks x3   No problem-specific Assessment & Plan notes found for this encounter.   SUMMARY OF ONCOLOGIC HISTORY: Oncology History  Pancreatic cancer ([place])  03/03/2021 Imaging   US Abdomen Limited RUQ IMPRESSION: Probable fatty infiltration of liver as above.   Distended gallbladder without definite stones or wall thickening.   Borderline CBD dilatation, CBD 7 mm diameter.   03/15/2021 Imaging   MR ABDOMEN MRCP W WO CONTAST   IMPRESSION: 1. Enhancing 2.6 x 2.2 x 3.3 cm mass in the head of the pancreas causing obstruction of the distal common bile duct resulting in intra and extrahepatic biliary ductal dilatation. Findings are highly concerning for primary pancreatic neoplasm. Further evaluation with endoscopic ultrasound is strongly recommended in the near future. 2. No definite signs of metastatic disease identified in the abdomen.    03/28/2021 Procedure   DG ERCP WITH SPHINCTEROTOMY  Performed by Dr. Watt Climes  FINDINGS: A total of 4 fluoroscopic spot images taken during ERCP are submitted for review. Initial images demonstrate scope overlying the upper abdomen with wire catheterization of the common bile duct. Contrast opacification of the common bile duct demonstrates a dilated appearance with abrupt shouldering compatible with obstruction. A metal CBD stent is then placed, and appears in grossly satisfactory position.   IMPRESSION: Fluoroscopic spot images taken during ERCP with metallic stent deployment as described. Refer to dedicated procedure report for full details.   03/28/2021 Procedure   UPPER ENDOSCOPIC ULTRASOUND  - There was dilation in the common bile duct which measured up to 15 mm. - A few abnormal lymph nodes were visualized in the  porta hepatis region. - A mass was identified in the pancreatic head. This was staged T3 N1 Mx by endosonographic criteria. Fine needle aspiration performed. - There was  no evidence of significant pathology in the left lobe of the liver.  Findings: There was dilation in the common bile duct which measured up to 15 mm. A few abnormal lymph nodes were visualized in the porta hepatis region. The nodes were hypoechoic and had poorly defined margins. An irregular mass was identified in the pancreatic head. The mass was hypoechoic. The mass measured 25 mm by 20 mm in maximal cross-sectional diameter. The endosonographic borders were poorlydefined. An intact interface was seen between the mass and the superior mesenteric artery, portal vein, superior mesenteric vein and splenic vein suggesting a lack of invasion. The remainder of the pancreas was examined. The endosonographic appearance of parenchyma and the upstream pancreatic duct indicated duct dilation. Fine needle aspiration for cytology was performed. Color Doppler imaging was utilized prior to needle puncture to confirm a lack of significant vascular structures within the needle path. Three passes were made with the 25 gauge needle using a transduodenal approach. A stylet was used. A cytotechnologist was present to evaluate the adequacy of the specimen. Preliminary cytology is suspicious for adenocarcinoma (final results are pending).   03/28/2021 Pathology Results   CYTOLOGY - NON PAP  CASE: [id number]  PATIENT: Casey Pierce  Non-Gynecological Cytology Report   Clinical History: Obstructive jaudice  Specimen Submitted:  A. PANCREAS, HEAD, FINE NEEDLE ASPIRATION:    FINAL MICROSCOPIC DIAGNOSIS:  - Malignant cells consistent with adenocarcinoma    03/28/2021 Cancer Staging   Staging form: Exocrine Pancreas, AJCC 8th Edition - Clinical stage from 03/28/2021: Stage IIB (cT2, cN1, cM0) - Signed by Truitt Merle, MD on 04/06/2021 Stage prefix: Initial diagnosis    04/05/2021 Initial Diagnosis   Pancreatic cancer ([place])   04/19/2021 -  Chemotherapy   Patient is on Treatment Plan : PANCREAS Modified  FOLFIRINOX q14d x 4 cycles        INTERVAL HISTORY:  Casey Pierce. is here for a follow up of pancreatic cancer. He was last seen by me on 04/07/21 in consultation with PA Cassie. He presents to the clinic accompanied by his wife. They asked about his recent CT. I asked if he ever smoked or if he has a cough. He denies smoking history and reports cough related to acid reflux.   All other systems were reviewed with the patient and are negative.  MEDICAL HISTORY:  Past Medical History:  Diagnosis Date   Atrial fibrillation ([place])    Coronary artery disease    NSTEMI 12/1998 - LHC:  prox 85-90, RCA prox 30-50, EF 65-70 >> ZHG:DJME and BMS to LAD; large Dx jailed by stent (90%), EF 60  // Myoview 7/18: normal perfusion, Low Risk   History of echocardiogram    Echo 7/18: mild LVH, EF 60-65, no RWMA, mild MR, mod LAE   Myocardial infarction [facility])    Pancreatic cancer ([place])    Stroke ([place]) 04/2014    SURGICAL HISTORY: Past Surgical History:  Procedure Laterality Date   BILIARY STENT PLACEMENT N/A 03/28/2021   Procedure: BILIARY STENT PLACEMENT;  Surgeon: Clarene Essex, MD;  Location: [facility] ENDOSCOPY;  Service: Endoscopy;  Laterality: N/A;   CARDIOVERSION N/A 06/20/2015   Procedure: CARDIOVERSION;  Surgeon: [name], MD;  Location: [facility] ENDOSCOPY;  Service: Cardiovascular;  Laterality: N/A;   COLONOSCOPY WITH PROPOFOL N/A 02/07/2015   Procedure: COLONOSCOPY WITH PROPOFOL;  Surgeon: [name], MD;  Location: [facility];  Service: Endoscopy;  Laterality: N/A;   CORONARY STENT PLACEMENT  2001   ERCP N/A 03/28/2021   Procedure: ENDOSCOPIC RETROGRADE CHOLANGIOPANCREATOGRAPHY (ERCP);  Surgeon: Clarene Essex, MD;  Location: [facility];  Service: Endoscopy;  Laterality: N/A;   ESOPHAGOGASTRODUODENOSCOPY N/A 03/28/2021   Procedure: ESOPHAGOGASTRODUODENOSCOPY (EGD);  Surgeon: Arta Silence, MD;  Location: [facility];  Service: Gastroenterology;  Laterality: N/A;   EUS N/A 03/28/2021    Procedure: FULL UPPER ENDOSCOPIC ULTRASOUND (EUS) RADIAL;  Surgeon: Arta Silence, MD;  Location: [facility];  Service: Gastroenterology;  Laterality: N/A;   FINE NEEDLE ASPIRATION N/A 03/28/2021   Procedure: FINE NEEDLE ASPIRATION (FNA) LINEAR;  Surgeon: Arta Silence, MD;  Location: [facility];  Service: Gastroenterology;  Laterality: N/A;   KNEE SURGERY  1972   PORTACATH PLACEMENT N/A 04/18/2021   Procedure: PORT PLACEMENT;  Surgeon: Stark Klein, MD;  Location: [facility];  Service: General;  Laterality: N/A;   SPHINCTEROTOMY  03/28/2021   Procedure: SPHINCTEROTOMY;  Surgeon: Clarene Essex, MD;  Location: [facility];  Service: Endoscopy;;    I have reviewed the social history and family history with the patient and they are unchanged from previous note.  ALLERGIES:  has No Known Allergies.  MEDICATIONS:  Current Outpatient Medications  Medication Sig Dispense Refill   atorvastatin (LIPITOR) 10 MG tablet 1 tablet     ELIQUIS 5 MG TABS tablet TAKE 1 TABLET TWICE DAILY 180 tablet 0   famotidine (PEPCID) 20 MG tablet 1 tablet at bedtime as needed     lidocaine-prilocaine (EMLA) cream Apply to affected area once 30 g 3   ondansetron (ZOFRAN) 8 MG tablet Take 1 tablet (8 mg total) by mouth 2 (two) times daily as needed. Start on day 3 after chemotherapy. 30 tablet 1   oxyCODONE (OXY IR/ROXICODONE) 5 MG immediate release tablet Take 1 tablet (5 mg total) by mouth every 6 (six) hours as needed for severe pain. 5 tablet 0   prochlorperazine (COMPAZINE) 10 MG tablet Take 1 tablet (10 mg total) by mouth every 6 (six) hours as needed (Nausea or vomiting). 30 tablet 1   No current facility-administered medications for this visit.   Facility-Administered Medications Ordered in Other Visits  Medication Dose Route Frequency Provider Last Rate Last Admin   fluorouracil (ADRUCIL) 4,350 mg in sodium chloride 0.9 % 63 mL chemo infusion  2,400 mg/m2 (Treatment Plan Recorded)  Intravenous 1 day or 1 dose Truitt Merle, MD       leucovorin 724 mg in dextrose 5 % 250 mL infusion  400 mg/m2 (Treatment Plan Recorded) Intravenous Once Truitt Merle, MD       oxaliplatin (ELOXATIN) 155 mg in dextrose 5 % 500 mL chemo infusion  85 mg/m2 (Treatment Plan Recorded) Intravenous Once Truitt Merle, MD        PHYSICAL EXAMINATION: ECOG PERFORMANCE STATUS: 1 - Symptomatic but completely ambulatory  Vitals:   04/19/21 1122  BP: 136/80  Pulse: 90  Temp: 98.1 F (36.7 C)  SpO2: 100%   Wt Readings from Last 3 Encounters:  04/19/21 162 lb 8 oz (73.7 kg)  04/18/21 156 lb 1.4 oz (70.8 kg)  04/07/21 158 lb (71.7 kg)     GENERAL:alert, no distress and comfortable SKIN: skin color normal, no rashes or significant lesions EYES: normal, Conjunctiva are pink and non-injected, sclera clear  NEURO: alert & oriented x 3 with fluent speech  LABORATORY DATA:  I have reviewed the data as  listed CBC Latest Ref Rng & Units 04/19/2021 09/05/2016 06/06/2015  WBC 4.0 - 10.5 K/uL 22.2(H) 9.9 8.2  Hemoglobin 13.0 - 17.0 g/dL 13.2 15.7 14.7  Hematocrit 39.0 - 52.0 % 38.8(L) 45.8 42.8  Platelets 150 - 400 K/uL 263 246 275     CMP Latest Ref Rng & Units 04/19/2021 04/17/2021 09/05/2016  Glucose 70 - 99 mg/dL 96 - 115(H)  BUN 8 - 23 mg/dL 11 - 10  Creatinine 0.61 - 1.24 mg/dL 0.63 0.80 0.85  Sodium 135 - 145 mmol/L 139 - 138  Potassium 3.5 - 5.1 mmol/L 3.4(L) - 4.3  Chloride 98 - 111 mmol/L 103 - 101  CO2 22 - 32 mmol/L 29 - 23  Calcium 8.9 - 10.3 mg/dL 9.3 - 9.6  Total Protein 6.5 - 8.1 g/dL 6.7 - -  Total Bilirubin 0.3 - 1.2 mg/dL 3.8(HH) - -  Alkaline Phos 38 - 126 U/L 90 - -  AST 15 - 41 U/L 19 - -  ALT 0 - 44 U/L 36 - -      RADIOGRAPHIC STUDIES: I have personally reviewed the radiological images as listed and agreed with the findings in the report. CT Chest W Contrast  Result Date: 04/18/2021 CLINICAL DATA:  66 year old male with history of pancreatic cancer diagnosed in January 2023.  Staging examination. EXAM: CT CHEST WITH CONTRAST TECHNIQUE: Multidetector CT imaging of the chest was performed during intravenous contrast administration. RADIATION DOSE REDUCTION: This exam was performed according to the departmental dose-optimization program which includes automated exposure control, adjustment of the mA and/or kV according to patient size and/or use of iterative reconstruction technique. CONTRAST:  38mL OMNIPAQUE IOHEXOL 300 MG/ML  SOLN COMPARISON:  No priors. FINDINGS: Cardiovascular: Heart size is normal. There is no significant pericardial fluid, thickening or pericardial calcification. There is aortic atherosclerosis, as well as atherosclerosis of the great vessels of the mediastinum and the coronary arteries, including calcified atherosclerotic plaque in the left main, left anterior descending, left circumflex and right coronary arteries. Mediastinum/Nodes: No pathologically enlarged mediastinal or hilar lymph nodes. Esophagus is unremarkable in appearance. No axillary lymphadenopathy. Lungs/Pleura: A few tiny pulmonary nodules are noted in the lungs bilaterally measuring 4 mm or less in size. The majority of these are subpleural in location, and favored to be benign. No other larger more suspicious appearing pulmonary nodules or masses are noted. No acute consolidative airspace disease. No pleural effusions. There are some peripheral predominant areas of ground-glass attenuation and mild septal thickening, most evident in the lower lobes of the lungs bilaterally (right greater than left), concerning for possible developing fibrosis. No acute consolidative airspace disease. No pleural effusions. Upper Abdomen: Common bile duct stent noted. Fullness in the region of the pancreatic head, incompletely imaged, but presumably corresponding to the known pancreatic neoplasm. Pneumobilia is iatrogenic in the setting of prior sphincterotomy and CBD stenting. Musculoskeletal: There are no aggressive  appearing lytic or blastic lesions noted in the visualized portions of the skeleton. IMPRESSION: 1. Small pulmonary nodules measuring 4 mm or less in size scattered throughout the visualize lungs. These are nonspecific, but statistically likely benign. However, the possibility of early metastatic disease is not excluded, and close attention on follow-up studies is recommended. 2. Changes in the dependent portions of the lungs concerning for possible early/mild interstitial lung disease. Outpatient referral to Pulmonology for further clinical evaluation is recommended. Additionally, follow-up high-resolution chest CT should be considered in 6-12 months to assess for temporal changes in the appearance of the lung  parenchyma. 3. Aortic atherosclerosis, in addition to left main and three-vessel coronary artery disease. Please note that although the presence of coronary artery calcium documents the presence of coronary artery disease, the severity of this disease and any potential stenosis cannot be assessed on this non-gated CT examination. Assessment for potential risk factor modification, dietary therapy or pharmacologic therapy may be warranted, if clinically indicated. 4. Additional findings, as above. Aortic Atherosclerosis (ICD10-I70.0). Electronically Signed   By: Vinnie Langton M.D.   On: 04/18/2021 06:23   DG CHEST PORT 1 VIEW  Result Date: 04/18/2021 CLINICAL DATA:  Port-A-Cath EXAM: PORTABLE CHEST 1 VIEW COMPARISON:  None. FINDINGS: Left chest wall port with tip positioned over the expected area of the upper right atrium. Cardiac and mediastinal contours are within normal limits. Small nodule of the left mid lung, correlate with recent chest CT. Lungs otherwise clear. No large pleural effusion or pneumothorax. IMPRESSION: 1. Left chest wall port with tip positioned over the expected area of the upper right atrium. 2. Small nodule of the left mid lung, correlate with recent chest CT. Electronically  Signed   By: Yetta Glassman M.D.   On: 04/18/2021 13:55   DG C-Arm 1-60 Min-No Report  Result Date: 04/18/2021 Fluoroscopy was utilized by the requesting physician.  No radiographic interpretation.      No orders of the defined types were placed in this encounter.  All questions were answered. The patient knows to call the clinic with any problems, questions or concerns. No barriers to learning was detected. The total time spent in the appointment was 30 minutes.     Truitt Merle, MD 04/19/2021   I, Wilburn Mylar, am acting as scribe for Truitt Merle, MD.   I have reviewed the above documentation for accuracy and completeness, and I agree with the above.

## 2021-04-20 ENCOUNTER — Telehealth: Payer: Self-pay

## 2021-04-20 LAB — CANCER ANTIGEN 19-9: CA 19-9: 98 U/mL — ABNORMAL HIGH (ref 0–35)

## 2021-04-20 NOTE — Telephone Encounter (Signed)
-----   Message from Charleston Poot, RN sent at 04/19/2021  4:22 PM EST ----- Regarding: First time/ oxaliplatin and 51fu/ Dr Burr Medico pt Hello,  Pt had first time oxaliplatin and 71fu today. Pump disconnect is 04/21/2021.   Thank you! Libbie K.

## 2021-04-20 NOTE — Telephone Encounter (Signed)
Casey Pierce states that he is doing fine.  Eatin, drinking, and urinating well. No side-effects noted. He knows to call the office at [phone] if he has any questions or concerns.

## 2021-04-21 ENCOUNTER — Other Ambulatory Visit: Payer: Self-pay

## 2021-04-21 ENCOUNTER — Telehealth: Payer: Self-pay | Admitting: *Deleted

## 2021-04-21 ENCOUNTER — Inpatient Hospital Stay: Payer: Medicare PPO

## 2021-04-21 VITALS — BP 106/77 | HR 70 | Temp 98.6°F | Resp 18

## 2021-04-21 DIAGNOSIS — Z8673 Personal history of transient ischemic attack (TIA), and cerebral infarction without residual deficits: Secondary | ICD-10-CM | POA: Diagnosis not present

## 2021-04-21 DIAGNOSIS — Z7901 Long term (current) use of anticoagulants: Secondary | ICD-10-CM | POA: Diagnosis not present

## 2021-04-21 DIAGNOSIS — C250 Malignant neoplasm of head of pancreas: Secondary | ICD-10-CM | POA: Diagnosis not present

## 2021-04-21 DIAGNOSIS — R110 Nausea: Secondary | ICD-10-CM | POA: Diagnosis not present

## 2021-04-21 DIAGNOSIS — I252 Old myocardial infarction: Secondary | ICD-10-CM | POA: Diagnosis not present

## 2021-04-21 DIAGNOSIS — Z95828 Presence of other vascular implants and grafts: Secondary | ICD-10-CM

## 2021-04-21 DIAGNOSIS — Z5111 Encounter for antineoplastic chemotherapy: Secondary | ICD-10-CM | POA: Diagnosis not present

## 2021-04-21 DIAGNOSIS — I2510 Atherosclerotic heart disease of native coronary artery without angina pectoris: Secondary | ICD-10-CM | POA: Diagnosis not present

## 2021-04-21 DIAGNOSIS — I4891 Unspecified atrial fibrillation: Secondary | ICD-10-CM | POA: Diagnosis not present

## 2021-04-21 MED ORDER — PEGFILGRASTIM-CBQV 6 MG/0.6ML ~~LOC~~ SOSY
6.0000 mg | PREFILLED_SYRINGE | Freq: Once | SUBCUTANEOUS | Status: AC
  Administered 2021-04-21: 6 mg via SUBCUTANEOUS

## 2021-04-21 MED ORDER — HEPARIN SOD (PORK) LOCK FLUSH 100 UNIT/ML IV SOLN
500.0000 [IU] | Freq: Once | INTRAVENOUS | Status: AC
  Administered 2021-04-21: 500 [IU] via INTRAVENOUS

## 2021-04-21 MED ORDER — SODIUM CHLORIDE 0.9% FLUSH
10.0000 mL | INTRAVENOUS | Status: DC | PRN
  Administered 2021-04-21: 10 mL via INTRAVENOUS

## 2021-04-21 NOTE — Telephone Encounter (Signed)
FMLA form [id number] for spouse Yostin Malacara completed and ready for pickup; No fax number provided for [facility] to return.  Placed in envelope with [place] for Linkin Vizzini.    Forde Radon. needs to obtain billing and medical records to return all information together as packet to validate claim and prevent delayed processing as noted per policy protocol.   Original copies for both claimants to complete process to alphabetical file folder behind appointment registration area one for patient pick up.  Provided note with [employer] [facility] ([place]) Information Management Office, Phone: [phone], Fax: [phone] and Patient Accounting Customer Service (Billing) phone: [phone].

## 2021-04-25 ENCOUNTER — Inpatient Hospital Stay: Payer: Medicare PPO | Admitting: Hematology

## 2021-04-25 DIAGNOSIS — C250 Malignant neoplasm of head of pancreas: Secondary | ICD-10-CM | POA: Diagnosis not present

## 2021-04-25 NOTE — Progress Notes (Signed)
Casey Pierce   [phone] Fax:([phone]   Clinic Follow up Note   Patient Care Team: Casey Orn, MD as PCP - General (Internal Medicine) Casey Sprang, MD as PCP - Cardiology (Cardiology) Casey Merle, MD as Consulting Physician (Oncology) Casey Bake, RN as Oncology Nurse Navigator (Oncology)  Date of Service:  04/25/2021  CHIEF COMPLAINT: f/u of pancreatic cancer  CURRENT THERAPY:  Neoadjuvant FOLFIRINOX, q2weeks, starting 04/19/21  ASSESSMENT & PLAN:  Casey Pierce. is a 66 y.o. male with   1. Pancreatic adenocarcinoma, Stage IIB, p(T2 N1)M0 -presented with painless obstructive jaundice in 02/2021. RUQ ultrasound and subsequently MRI of the abdomen 03/07/21 showed a 2.6 x 2.2 x 3.3 cm mass in the head of the pancreas causing obstruction of the distal common bile duct resulting in intra and extrahepatic biliary duct dilation. -ERCP and EUS with stent placement performed on 03/28/21, showed 2.5 cm mass, no evidence of invasion. FNA of the pancreatic head lesion showed malignant cells consistent with adenocarcinoma. -he was seen by Dr. Barry Pierce on 04/10/21, who recommend neoadjuvant chemo followed by Whipple procedure. -staging chest CT 04/17/21 showed nonspecific small (4 mm or less) pulmonary nodules. I reviewed the results with them today. -port placed 04/18/21. He started neoadjuvant FOLFIRINOX on 04/19/21. Irinotecan was held due to elevated tbil  -He tolerated first cycle of chemotherapy overall well  2. Genetics -appointment with genetic counselor scheduled for 05/01/21   3. Comorbidities -Patient has history of CVA in 2016 -The patient has some numbness in his right palm, but is functional with his activities of daily living without any significant debility.     PLAN: -He tolerated first cycle of chemotherapy overall well -Cycle 2 next week    No problem-specific Assessment & Plan notes found for this encounter.   SUMMARY OF ONCOLOGIC  HISTORY: Oncology History  Pancreatic cancer ([place])  03/03/2021 Imaging   US Abdomen Limited RUQ IMPRESSION: Probable fatty infiltration of liver as above.   Distended gallbladder without definite stones or wall thickening.   Borderline CBD dilatation, CBD 7 mm diameter.   03/15/2021 Imaging   MR ABDOMEN MRCP W WO CONTAST   IMPRESSION: 1. Enhancing 2.6 x 2.2 x 3.3 cm mass in the head of the pancreas causing obstruction of the distal common bile duct resulting in intra and extrahepatic biliary ductal dilatation. Findings are highly concerning for primary pancreatic neoplasm. Further evaluation with endoscopic ultrasound is strongly recommended in the near future. 2. No definite signs of metastatic disease identified in the abdomen.    03/28/2021 Procedure   DG ERCP WITH SPHINCTEROTOMY  Performed by Dr. Watt Pierce  FINDINGS: A total of 4 fluoroscopic spot images taken during ERCP are submitted for review. Initial images demonstrate scope overlying the upper abdomen with wire catheterization of the common bile duct. Contrast opacification of the common bile duct demonstrates a dilated appearance with abrupt shouldering compatible with obstruction. A metal CBD stent is then placed, and appears in grossly satisfactory position.   IMPRESSION: Fluoroscopic spot images taken during ERCP with metallic stent deployment as described. Refer to dedicated procedure report for full details.   03/28/2021 Procedure   UPPER ENDOSCOPIC ULTRASOUND  - There was dilation in the common bile duct which measured up to 15 mm. - A few abnormal lymph nodes were visualized in the porta hepatis region. - A mass was identified in the pancreatic head. This was staged T3 N1 Mx by endosonographic criteria. Fine needle aspiration performed. - There  was no evidence of significant pathology in the left lobe of the liver.  Findings: There was dilation in the common bile duct which measured up to 15 mm. A  few abnormal lymph nodes were visualized in the porta hepatis region. The nodes were hypoechoic and had poorly defined margins. An irregular mass was identified in the pancreatic head. The mass was hypoechoic. The mass measured 25 mm by 20 mm in maximal cross-sectional diameter. The endosonographic borders were poorlydefined. An intact interface was seen between the mass and the superior mesenteric artery, portal vein, superior mesenteric vein and splenic vein suggesting a lack of invasion. The remainder of the pancreas was examined. The endosonographic appearance of parenchyma and the upstream pancreatic duct indicated duct dilation. Fine needle aspiration for cytology was performed. Color Doppler imaging was utilized prior to needle puncture to confirm a lack of significant vascular structures within the needle path. Three passes were made with the 25 gauge needle using a transduodenal approach. A stylet was used. A cytotechnologist was present to evaluate the adequacy of the specimen. Preliminary cytology is suspicious for adenocarcinoma (final results are pending).   03/28/2021 Pathology Results   CYTOLOGY - NON PAP  CASE: [id number]  PATIENT: Casey Pierce  Non-Gynecological Cytology Report   Clinical History: Obstructive jaudice  Specimen Submitted:  A. PANCREAS, HEAD, FINE NEEDLE ASPIRATION:    FINAL MICROSCOPIC DIAGNOSIS:  - Malignant cells consistent with adenocarcinoma    03/28/2021 Cancer Staging   Staging form: Exocrine Pancreas, AJCC 8th Edition - Clinical stage from 03/28/2021: Stage IIB (cT2, cN1, cM0) - Signed by Casey Merle, MD on 04/06/2021 Stage prefix: Initial diagnosis    04/05/2021 Initial Diagnosis   Pancreatic cancer ([place])   04/19/2021 -  Chemotherapy   Patient is on Treatment Plan : PANCREAS Modified FOLFIRINOX q14d x 4 cycles        INTERVAL HISTORY:  Casey Pierce. Is scheduled to follow-up Casey Pierce After first cycle of  chemotherapy.  I spoke with the patient and his wife on the phone.  He had RUQ abdominal pain once and intermittent nausea after chemo, he took compazine which helped.  He noticed some muscle spasm at back, he toold tylenol  He otherwise doing well, he has been eating well, weight is stable  No cold sensitivity or neuropathy  All other systems were reviewed with the patient and are negative.  MEDICAL HISTORY:  Past Medical History:  Diagnosis Date   Atrial fibrillation ([place])    Coronary artery disease    NSTEMI 12/1998 - LHC:  prox 85-90, RCA prox 30-50, EF 65-70 >> HKV:QQVZ and BMS to LAD; large Dx jailed by stent (90%), EF 60  // Myoview 7/18: normal perfusion, Low Risk   History of echocardiogram    Echo 7/18: mild LVH, EF 60-65, no RWMA, mild MR, mod LAE   Myocardial infarction [facility])    Pancreatic cancer ([place])    Stroke ([place]) 04/2014    SURGICAL HISTORY: Past Surgical History:  Procedure Laterality Date   BILIARY STENT PLACEMENT N/A 03/28/2021   Procedure: BILIARY STENT PLACEMENT;  Surgeon: Clarene Essex, MD;  [facility];  Service: Endoscopy;  Laterality: N/A;   CARDIOVERSION N/A 06/20/2015   Procedure: CARDIOVERSION;  Surgeon: [name], MD;  Location: [facility];  Service: Cardiovascular;  Laterality: N/A;   COLONOSCOPY WITH PROPOFOL N/A 02/07/2015   Procedure: COLONOSCOPY WITH PROPOFOL;  Surgeon: [name], MD;  [facility];  Service: Endoscopy;  Laterality:  N/A;   CORONARY STENT PLACEMENT  2001   ERCP N/A 03/28/2021   Procedure: ENDOSCOPIC RETROGRADE CHOLANGIOPANCREATOGRAPHY (ERCP);  Surgeon: Clarene Essex, MD;  Location: [facility];  Service: Endoscopy;  Laterality: N/A;   ESOPHAGOGASTRODUODENOSCOPY N/A 03/28/2021   Procedure: ESOPHAGOGASTRODUODENOSCOPY (EGD);  Surgeon: Arta Silence, MD;  Location[facility];  Service: Gastroenterology;  Laterality: N/A;   EUS N/A 03/28/2021   Procedure: FULL UPPER ENDOSCOPIC ULTRASOUND (EUS) RADIAL;   Surgeon: Arta Silence, MD;  [facility];  Service: Gastroenterology;  Laterality: N/A;   FINE NEEDLE ASPIRATION N/A 03/28/2021   Procedure: FINE NEEDLE ASPIRATION (FNA) LINEAR;  Surgeon: Arta Silence, MD;  [facility];  Service: Gastroenterology;  Laterality: N/A;   KNEE SURGERY  1972   PORTACATH PLACEMENT N/A 04/18/2021   Procedure: PORT PLACEMENT;  Surgeon: Stark Klein, MD;  Location: [facility];  Service: General;  Laterality: N/A;   SPHINCTEROTOMY  03/28/2021   Procedure: SPHINCTEROTOMY;  Surgeon: Clarene Essex, MD;  [facility];  Service: Endoscopy;;    I have reviewed the social history and family history with the patient and they are unchanged from previous note.  ALLERGIES:  has No Known Allergies.  MEDICATIONS:  Current Outpatient Medications  Medication Sig Dispense Refill   atorvastatin (LIPITOR) 10 MG tablet 1 tablet     ELIQUIS 5 MG TABS tablet TAKE 1 TABLET TWICE DAILY 180 tablet 0   famotidine (PEPCID) 20 MG tablet 1 tablet at bedtime as needed     lidocaine-prilocaine (EMLA) cream Apply to affected area once 30 g 3   ondansetron (ZOFRAN) 8 MG tablet Take 1 tablet (8 mg total) by mouth 2 (two) times daily as needed. Start on day 3 after chemotherapy. 30 tablet 1   oxyCODONE (OXY IR/ROXICODONE) 5 MG immediate release tablet Take 1 tablet (5 mg total) by mouth every 6 (six) hours as needed for severe pain. 5 tablet 0   prochlorperazine (COMPAZINE) 10 MG tablet Take 1 tablet (10 mg total) by mouth every 6 (six) hours as needed (Nausea or vomiting). 30 tablet 1   No current facility-administered medications for this visit.    PHYSICAL EXAMINATION: ECOG PERFORMANCE STATUS: 1 - Symptomatic but completely ambulatory No exam   LABORATORY DATA:  I have reviewed the data as listed CBC Latest Ref Rng & Units 04/19/2021 09/05/2016 06/06/2015  WBC 4.0 - 10.5 K/uL 22.2(H) 9.9 8.2  Hemoglobin 13.0 - 17.0 g/dL 13.2 15.7 14.7   Hematocrit 39.0 - 52.0 % 38.8(L) 45.8 42.8  Platelets 150 - 400 K/uL 263 246 275     CMP Latest Ref Rng & Units 04/19/2021 04/17/2021 09/05/2016  Glucose 70 - 99 mg/dL 96 - 115(H)  BUN 8 - 23 mg/dL 11 - 10  Creatinine 0.61 - 1.24 mg/dL 0.63 0.80 0.85  Sodium 135 - 145 mmol/L 139 - 138  Potassium 3.5 - 5.1 mmol/L 3.4(L) - 4.3  Chloride 98 - 111 mmol/L 103 - 101  CO2 22 - 32 mmol/L 29 - 23  Calcium 8.9 - 10.3 mg/dL 9.3 - 9.6  Total Protein 6.5 - 8.1 g/dL 6.7 - -  Total Bilirubin 0.3 - 1.2 mg/dL 3.8(HH) - -  Alkaline Phos 38 - 126 U/L 90 - -  AST 15 - 41 U/L 19 - -  ALT 0 - 44 U/L 36 - -      RADIOGRAPHIC STUDIES: I have personally reviewed the radiological images as listed and agreed with the findings in the report. No results found.  No orders of the defined types were placed in this encounter.  All questions were answered. The patient knows to call the clinic with any problems, questions or concerns. No barriers to learning was detected. The total time spent in the appointment was 12 minutes.     Casey Merle, MD 04/25/2021

## 2021-04-27 ENCOUNTER — Other Ambulatory Visit: Payer: Self-pay | Admitting: Licensed Clinical Social Worker

## 2021-04-27 DIAGNOSIS — C250 Malignant neoplasm of head of pancreas: Secondary | ICD-10-CM

## 2021-05-01 ENCOUNTER — Inpatient Hospital Stay: Payer: Medicare PPO

## 2021-05-01 ENCOUNTER — Encounter: Payer: Self-pay | Admitting: Licensed Clinical Social Worker

## 2021-05-01 ENCOUNTER — Inpatient Hospital Stay (HOSPITAL_BASED_OUTPATIENT_CLINIC_OR_DEPARTMENT_OTHER): Payer: Medicare PPO | Admitting: Licensed Clinical Social Worker

## 2021-05-01 ENCOUNTER — Other Ambulatory Visit: Payer: Self-pay

## 2021-05-01 DIAGNOSIS — I4891 Unspecified atrial fibrillation: Secondary | ICD-10-CM | POA: Diagnosis not present

## 2021-05-01 DIAGNOSIS — R110 Nausea: Secondary | ICD-10-CM | POA: Diagnosis not present

## 2021-05-01 DIAGNOSIS — Z5111 Encounter for antineoplastic chemotherapy: Secondary | ICD-10-CM | POA: Diagnosis not present

## 2021-05-01 DIAGNOSIS — I252 Old myocardial infarction: Secondary | ICD-10-CM | POA: Diagnosis not present

## 2021-05-01 DIAGNOSIS — C250 Malignant neoplasm of head of pancreas: Secondary | ICD-10-CM

## 2021-05-01 DIAGNOSIS — Z7901 Long term (current) use of anticoagulants: Secondary | ICD-10-CM | POA: Diagnosis not present

## 2021-05-01 DIAGNOSIS — Z8673 Personal history of transient ischemic attack (TIA), and cerebral infarction without residual deficits: Secondary | ICD-10-CM | POA: Diagnosis not present

## 2021-05-01 DIAGNOSIS — I2510 Atherosclerotic heart disease of native coronary artery without angina pectoris: Secondary | ICD-10-CM | POA: Diagnosis not present

## 2021-05-01 LAB — CMP (CANCER CENTER ONLY)
ALT: 46 U/L — ABNORMAL HIGH (ref 0–44)
AST: 27 U/L (ref 15–41)
Albumin: 3.9 g/dL (ref 3.5–5.0)
Alkaline Phosphatase: 137 U/L — ABNORMAL HIGH (ref 38–126)
Anion gap: 5 (ref 5–15)
BUN: 12 mg/dL (ref 8–23)
CO2: 30 mmol/L (ref 22–32)
Calcium: 9.6 mg/dL (ref 8.9–10.3)
Chloride: 102 mmol/L (ref 98–111)
Creatinine: 0.81 mg/dL (ref 0.61–1.24)
GFR, Estimated: >60 mL/min (ref >60)
Glucose, Bld: 121 mg/dL — ABNORMAL HIGH (ref 70–99)
Potassium: 4.4 mmol/L (ref 3.5–5.1)
Sodium: 137 mmol/L (ref 135–145)
Total Bilirubin: 2.1 mg/dL — ABNORMAL HIGH (ref 0.3–1.2)
Total Protein: 6.9 g/dL (ref 6.5–8.1)

## 2021-05-01 LAB — CBC WITH DIFFERENTIAL (CANCER CENTER ONLY)
Abs Immature Granulocytes: 0.19 10*3/uL — ABNORMAL HIGH (ref 0.00–0.07)
Basophils Absolute: 0.1 10*3/uL (ref 0.0–0.1)
Basophils Relative: 1 %
Eosinophils Absolute: 0.1 10*3/uL (ref 0.0–0.5)
Eosinophils Relative: 1 %
HCT: 45.3 % (ref 39.0–52.0)
Hemoglobin: 15.6 g/dL (ref 13.0–17.0)
Immature Granulocytes: 1 %
Lymphocytes Relative: 16 %
Lymphs Abs: 2.6 10*3/uL (ref 0.7–4.0)
MCH: 31.5 pg (ref 26.0–34.0)
MCHC: 34.4 g/dL (ref 30.0–36.0)
MCV: 91.5 fL (ref 80.0–100.0)
Monocytes Absolute: 0.9 10*3/uL (ref 0.1–1.0)
Monocytes Relative: 6 %
Neutro Abs: 12.1 10*3/uL — ABNORMAL HIGH (ref 1.7–7.7)
Neutrophils Relative %: 75 %
Platelet Count: 179 10*3/uL (ref 150–400)
RBC: 4.95 MIL/uL (ref 4.22–5.81)
RDW: 15.4 % (ref 11.5–15.5)
WBC Count: 16 10*3/uL — ABNORMAL HIGH (ref 4.0–10.5)
nRBC: 0 % (ref 0.0–0.2)

## 2021-05-01 LAB — GENETIC SCREENING ORDER

## 2021-05-01 NOTE — Progress Notes (Signed)
REFERRING PROVIDER: Truitt Merle, MD [address] [place],  [place] [address]  PRIMARY PROVIDER:  Lavone Orn, MD  PRIMARY REASON FOR VISIT:  1. Malignant neoplasm of head of pancreas ([place])      HISTORY OF PRESENT ILLNESS:   Mr. Freese, a 66 y.o. male, was seen for a [place] cancer genetics consultation at the request of Dr. Burr Medico due to a personal and family history of cancer.  Mr. Burruss presents to clinic today to discuss the possibility of a hereditary predisposition to cancer, genetic testing, and to further clarify his future cancer risks, as well as potential cancer risks for family members.   In 2022, at the age of 36, Mr. Asmar was diagnosed with pancreatic cancer. This is being treated with neoadjuvant chemotherapy.  CANCER HISTORY:  Oncology History  Pancreatic cancer ([place])  03/03/2021 Imaging   US Abdomen Limited RUQ IMPRESSION: Probable fatty infiltration of liver as above.   Distended gallbladder without definite stones or wall thickening.   Borderline CBD dilatation, CBD 7 mm diameter.   03/15/2021 Imaging   MR ABDOMEN MRCP W WO CONTAST   IMPRESSION: 1. Enhancing 2.6 x 2.2 x 3.3 cm mass in the head of the pancreas causing obstruction of the distal common bile duct resulting in intra and extrahepatic biliary ductal dilatation. Findings are highly concerning for primary pancreatic neoplasm. Further evaluation with endoscopic ultrasound is strongly recommended in the near future. 2. No definite signs of metastatic disease identified in the abdomen.    03/28/2021 Procedure   DG ERCP WITH SPHINCTEROTOMY  Performed by Dr. Watt Climes  FINDINGS: A total of 4 fluoroscopic spot images taken during ERCP are submitted for review. Initial images demonstrate scope overlying the upper abdomen with wire catheterization of the common bile duct. Contrast opacification of the common bile duct demonstrates a dilated appearance with abrupt shouldering compatible  with obstruction. A metal CBD stent is then placed, and appears in grossly satisfactory position.   IMPRESSION: Fluoroscopic spot images taken during ERCP with metallic stent deployment as described. Refer to dedicated procedure report for full details.   03/28/2021 Procedure   UPPER ENDOSCOPIC ULTRASOUND  - There was dilation in the common bile duct which measured up to 15 mm. - A few abnormal lymph nodes were visualized in the porta hepatis region. - A mass was identified in the pancreatic head. This was staged T3 N1 Mx by endosonographic criteria. Fine needle aspiration performed. - There was no evidence of significant pathology in the left lobe of the liver.  Findings: There was dilation in the common bile duct which measured up to 15 mm. A few abnormal lymph nodes were visualized in the porta hepatis region. The nodes were hypoechoic and had poorly defined margins. An irregular mass was identified in the pancreatic head. The mass was hypoechoic. The mass measured 25 mm by 20 mm in maximal cross-sectional diameter. The endosonographic borders were poorlydefined. An intact interface was seen between the mass and the superior mesenteric artery, portal vein, superior mesenteric vein and splenic vein suggesting a lack of invasion. The remainder of the pancreas was examined. The endosonographic appearance of parenchyma and the upstream pancreatic duct indicated duct dilation. Fine needle aspiration for cytology was performed. Color Doppler imaging was utilized prior to needle puncture to confirm a lack of significant vascular structures within the needle path. Three passes were made with the 25 gauge needle using a transduodenal approach. A stylet was used. A cytotechnologist was present to evaluate the adequacy  of the specimen. Preliminary cytology is suspicious for adenocarcinoma (final results are pending).   03/28/2021 Pathology Results   CYTOLOGY - NON PAP  CASE: [id number]   PATIENT: Chloe Eidem  Non-Gynecological Cytology Report   Clinical History: Obstructive jaudice  Specimen Submitted:  A. PANCREAS, HEAD, FINE NEEDLE ASPIRATION:    FINAL MICROSCOPIC DIAGNOSIS:  - Malignant cells consistent with adenocarcinoma    03/28/2021 Cancer Staging   Staging form: Exocrine Pancreas, AJCC 8th Edition - Clinical stage from 03/28/2021: Stage IIB (cT2, cN1, cM0) - Signed by Truitt Merle, MD on 04/06/2021 Stage prefix: Initial diagnosis    04/05/2021 Initial Diagnosis   Pancreatic cancer ([place])   04/19/2021 -  Chemotherapy   Patient is on Treatment Plan : PANCREAS Modified FOLFIRINOX q14d x 4 cycles       Past Medical History:  Diagnosis Date   Atrial fibrillation ([place])    Coronary artery disease    NSTEMI 12/1998 - LHC:  prox 85-90, RCA prox 30-50, EF 65-70 >> SWH:QPRF and BMS to LAD; large Dx jailed by stent (90%), EF 60  // Myoview 7/18: normal perfusion, Low Risk   History of echocardiogram    Echo 7/18: mild LVH, EF 60-65, no RWMA, mild MR, mod LAE   Myocardial infarction [facility])    Pancreatic cancer ([place])    Stroke ([place]) 04/2014    Past Surgical History:  Procedure Laterality Date   BILIARY STENT PLACEMENT N/A 03/28/2021   Procedure: BILIARY STENT PLACEMENT;  Surgeon: Clarene Essex, MD;  [facility];  Service: Endoscopy;  Laterality: N/A;   CARDIOVERSION N/A 06/20/2015   Procedure: CARDIOVERSION;  Surgeon: [name], MD;  Location: [facility];  Service: Cardiovascular;  Laterality: N/A;   COLONOSCOPY WITH PROPOFOL N/A 02/07/2015   Procedure: COLONOSCOPY WITH PROPOFOL;  Surgeon: [name], MD;  [facility];  Service: Endoscopy;  Laterality: N/A;   CORONARY STENT PLACEMENT  2001   ERCP N/A 03/28/2021   Procedure: ENDOSCOPIC RETROGRADE CHOLANGIOPANCREATOGRAPHY (ERCP);  Surgeon: Clarene Essex, MD;  [facility];  Service: Endoscopy;  Laterality: N/A;   ESOPHAGOGASTRODUODENOSCOPY N/A 03/28/2021   Procedure:  ESOPHAGOGASTRODUODENOSCOPY (EGD);  Surgeon: Arta Silence, MD;  [facility];  Service: Gastroenterology;  Laterality: N/A;   EUS N/A 03/28/2021   Procedure: FULL UPPER ENDOSCOPIC ULTRASOUND (EUS) RADIAL;  Surgeon: Arta Silence, MD;  [facility];  Service: Gastroenterology;  Laterality: N/A;   FINE NEEDLE ASPIRATION N/A 03/28/2021   Procedure: FINE NEEDLE ASPIRATION (FNA) LINEAR;  Surgeon: Arta Silence, MD;  [facility];  Service: Gastroenterology;  Laterality: N/A;   KNEE SURGERY  1972   PORTACATH PLACEMENT N/A 04/18/2021   Procedure: PORT PLACEMENT;  Surgeon: Stark Klein, MD;  Location: [facility];  Service: General;  Laterality: N/A;   SPHINCTEROTOMY  03/28/2021   Procedure: SPHINCTEROTOMY;  Surgeon: Clarene Essex, MD;  [facility];  Service: Endoscopy;;    Social History   Socioeconomic History   Marital status: Married    Spouse name: Patty   Number of children: 3   Years of education: Associate   Highest education level: Not on file  Occupational History   Occupation: [employer]   Tobacco Use   Smoking status: Never   Smokeless tobacco: Former    Types: Chew    Quit date: 03/20/1999   Tobacco comments:    quit chewing 7 years ago  Vaping Use   Vaping Use: Never used  Substance and Sexual Activity  Alcohol use: No    Alcohol/week: 0.0 standard drinks   Drug use: No   Sexual activity: Not on file  Other Topics Concern   Not on file  Social History Narrative   Lives at home with wife.    Caffeine: Drinks coffee- about 3 cups per day   Social Determinants of Health   Financial Resource Strain: Not on file  Food Insecurity: Not on file  Transportation Needs: Not on file  Physical Activity: Not on file  Stress: Not on file  Social Connections: Not on file     FAMILY HISTORY:  We obtained a detailed, 4-generation family history.  Significant diagnoses are listed below: Family History  Problem  Relation Age of Onset   Cancer Father 38       tongue/neck   Heart attack Maternal Uncle    Cancer Paternal Uncle        unk type   Mr. Cragg has 3 daughters, no history of cancer. He has 3 paternal half brothers, 2 paternal half sisters, 1 maternal half sister.   Mr. Wurm mother is living at 67 and had skin cancer removed from her calf at 77. Patient had 5 maternal uncles, no cancers. Maternal grandmother died at 38, grandfather died at 49.   Mr. Boettcher father died at 59 and has tongue/neck cancer and history of smoking/alcohol use. Patient had 2 paternal uncles, 2 paternal aunts. One uncle had cancer, unknown type and died at 34. Paternal grandmother died at 23, grandfather died at 57.  Mr. Cockerell is unaware of previous family history of genetic testing for hereditary cancer risks. . There is no reported Ashkenazi Jewish ancestry. There is no known consanguinity.    GENETIC COUNSELING ASSESSMENT: Mr. Sebo is a 66 y.o. male with a personal history of pancreatic cancer which is somewhat suggestive of a hereditary cancer syndrome and predisposition to cancer. We, therefore, discussed and recommended the following at today's visit.   DISCUSSION: We discussed that approximately 10% of pancreatic cancer is hereditary. Most cases of hereditary pancreatic cancer are associated with BRCA1/BRCA2 genes, although there are other genes associated with hereditary cancer as well. Cancers and risks are gene specific. We discussed that testing is beneficial for several reasons including knowing if an individual is a candidate for certain targeted therapies, knowing about other cancer risks, identifying potential screening and risk-reduction options that may be appropriate, and to understand if other family members could be at risk for cancer and allow them to undergo genetic testing.   We reviewed the characteristics, features and inheritance patterns of hereditary cancer syndromes. We also discussed  genetic testing, including the appropriate family members to test, the process of testing, insurance coverage and turn-around-time for results. We discussed the implications of a negative, positive and/or variant of uncertain significant result. We recommended Mr. Favor pursue genetic testing for the [facility] Multi-Cancer+RNA gene panel.   The Multi-Cancer Panel + RNA offered by [facility] includes sequencing and/or deletion duplication testing of the following 84 genes: AIP, ALK, APC, ATM, AXIN2,BAP1,  BARD1, BLM, BMPR1A, BRCA1, BRCA2, BRIP1, CASR, CDC73, CDH1, CDK4, CDKN1B, CDKN1C, CDKN2A (p14ARF), CDKN2A (p16INK4a), CEBPA, CHEK2, CTNNA1, DICER1, DIS3L2, EGFR (c.2369C>T, p.Thr790Met variant only), EPCAM (Deletion/duplication testing only), FH, FLCN, GATA2, GPC3, GREM1 (Promoter region deletion/duplication testing only), HOXB13 (c.251G>A, p.Gly84Glu), HRAS, KIT, MAX, MEN1, MET, MITF (c.952G>A, p.Glu318Lys variant only), MLH1, MSH2, MSH3, MSH6, MUTYH, NBN, NF1, NF2, NTHL1, PALB2, PDGFRA, PHOX2B, PMS2, POLD1, POLE, POT1, PRKAR1A, PTCH1, PTEN, RAD50, RAD51C, RAD51D, RB1, RECQL4, RET,  RUNX1, SDHAF2, SDHA (sequence changes only), SDHB, SDHC, SDHD, SMAD4, SMARCA4, SMARCB1, SMARCE1, STK11, SUFU, TERC, TERT, TMEM127, TP53, TSC1, TSC2, VHL, WRN and WT1.  Based on Mr. Schloesser's personal history of cancer, he meets medical criteria for genetic testing. Despite that he meets criteria, he may still have an out of pocket cost. We discussed that if his out of pocket cost for testing is over $100, the laboratory will call and confirm whether he wants to proceed with testing.  If the out of pocket cost of testing is less than $100 he will be billed by the genetic testing laboratory.   PLAN: After considering the risks, benefits, and limitations, Mr. Moss provided informed consent to pursue genetic testing and the blood sample was sent to [facility] for analysis of the Multi-Cancer+RNA panel. Results should be available  within approximately 2-3 weeks' time, at which point they will be disclosed by telephone to Mr. Bakos, as will any additional recommendations warranted by these results. Mr. Westhoff will receive a summary of his genetic counseling visit and a copy of his results once available. This information will also be available in Epic.   Mr. Winthrop questions were answered to his satisfaction today. Our contact information was provided should additional questions or concerns arise. Thank you for the referral and allowing [place] to share in the care of your patient.   Faith Rogue, MS, [facility] Genetic Counselor [email or web address] Phone: [phone]  The patient was seen for a total of 20 minutes in face-to-face genetic counseling.  Patient was seen alone. [employer] intern Carloyn Manner was present and assisted with this case.  Dr. Grayland Ormond was available for discussion regarding this case.   _______________________________________________________________________ For Office Staff:  Number of people involved in session: 2 Was an Intern/ student involved with case: yes

## 2021-05-02 ENCOUNTER — Inpatient Hospital Stay (HOSPITAL_BASED_OUTPATIENT_CLINIC_OR_DEPARTMENT_OTHER): Payer: Medicare PPO | Admitting: Nurse Practitioner

## 2021-05-02 ENCOUNTER — Inpatient Hospital Stay: Payer: Medicare PPO

## 2021-05-02 ENCOUNTER — Encounter: Payer: Self-pay | Admitting: Nurse Practitioner

## 2021-05-02 ENCOUNTER — Telehealth: Payer: Self-pay | Admitting: *Deleted

## 2021-05-02 VITALS — BP 113/69 | HR 78 | Temp 98.5°F | Resp 18 | Ht 65.0 in | Wt 161.4 lb

## 2021-05-02 DIAGNOSIS — C250 Malignant neoplasm of head of pancreas: Secondary | ICD-10-CM

## 2021-05-02 DIAGNOSIS — R110 Nausea: Secondary | ICD-10-CM | POA: Diagnosis not present

## 2021-05-02 DIAGNOSIS — I252 Old myocardial infarction: Secondary | ICD-10-CM | POA: Diagnosis not present

## 2021-05-02 DIAGNOSIS — Z8673 Personal history of transient ischemic attack (TIA), and cerebral infarction without residual deficits: Secondary | ICD-10-CM | POA: Diagnosis not present

## 2021-05-02 DIAGNOSIS — I4891 Unspecified atrial fibrillation: Secondary | ICD-10-CM | POA: Diagnosis not present

## 2021-05-02 DIAGNOSIS — Z5111 Encounter for antineoplastic chemotherapy: Secondary | ICD-10-CM | POA: Diagnosis not present

## 2021-05-02 DIAGNOSIS — I2510 Atherosclerotic heart disease of native coronary artery without angina pectoris: Secondary | ICD-10-CM | POA: Diagnosis not present

## 2021-05-02 DIAGNOSIS — Z7901 Long term (current) use of anticoagulants: Secondary | ICD-10-CM | POA: Diagnosis not present

## 2021-05-02 MED ORDER — SODIUM CHLORIDE 0.9 % IV SOLN
80.0000 mg/m2 | Freq: Once | INTRAVENOUS | Status: AC
  Administered 2021-05-02: 140 mg via INTRAVENOUS
  Filled 2021-05-02: qty 7

## 2021-05-02 MED ORDER — PALONOSETRON HCL INJECTION 0.25 MG/5ML
0.2500 mg | Freq: Once | INTRAVENOUS | Status: AC
  Administered 2021-05-02: 0.25 mg via INTRAVENOUS
  Filled 2021-05-02: qty 5

## 2021-05-02 MED ORDER — SODIUM CHLORIDE 0.9 % IV SOLN
150.0000 mg | Freq: Once | INTRAVENOUS | Status: AC
  Administered 2021-05-02: 150 mg via INTRAVENOUS
  Filled 2021-05-02: qty 150

## 2021-05-02 MED ORDER — DEXTROSE 5 % IV SOLN: Freq: Once | INTRAVENOUS | Status: AC

## 2021-05-02 MED ORDER — OXALIPLATIN CHEMO INJECTION 100 MG/20ML
85.0000 mg/m2 | Freq: Once | INTRAVENOUS | Status: AC
  Administered 2021-05-02: 155 mg via INTRAVENOUS
  Filled 2021-05-02: qty 31

## 2021-05-02 MED ORDER — SODIUM CHLORIDE 0.9 % IV SOLN
10.0000 mg | Freq: Once | INTRAVENOUS | Status: AC
  Administered 2021-05-02: 10 mg via INTRAVENOUS
  Filled 2021-05-02: qty 10

## 2021-05-02 MED ORDER — SODIUM CHLORIDE 0.9 % IV SOLN
400.0000 mg/m2 | Freq: Once | INTRAVENOUS | Status: AC
  Administered 2021-05-02: 724 mg via INTRAVENOUS
  Filled 2021-05-02: qty 25

## 2021-05-02 MED ORDER — SODIUM CHLORIDE 0.9% FLUSH
10.0000 mL | Freq: Once | INTRAVENOUS | Status: AC
  Administered 2021-05-02: 10 mL

## 2021-05-02 MED ORDER — ATROPINE SULFATE 1 MG/ML IV SOLN
0.5000 mg | Freq: Once | INTRAVENOUS | Status: AC | PRN
  Administered 2021-05-02: 0.5 mg via INTRAVENOUS
  Filled 2021-05-02: qty 1

## 2021-05-02 MED ORDER — SODIUM CHLORIDE 0.9 % IV SOLN
2400.0000 mg/m2 | INTRAVENOUS | Status: DC
  Administered 2021-05-02: 4350 mg via INTRAVENOUS
  Filled 2021-05-02: qty 87

## 2021-05-02 NOTE — Progress Notes (Signed)
[facility]   Telephone:([phone]) [phone] Fax:(336) [phone]   Clinic Follow up Note   Patient Care Team: Lavone Orn, MD as PCP - General (Internal Medicine) Deboraha Sprang, MD as PCP - Cardiology (Cardiology) Truitt Merle, MD as Consulting Physician (Oncology) Royston Bake, RN as Oncology Nurse Navigator (Oncology) 05/02/2021  CHIEF COMPLAINT: Follow up pancreatic cancer   SUMMARY OF ONCOLOGIC HISTORY: Oncology History  Pancreatic cancer ([place])  03/03/2021 Imaging   US Abdomen Limited RUQ IMPRESSION: Probable fatty infiltration of liver as above.   Distended gallbladder without definite stones or wall thickening.   Borderline CBD dilatation, CBD 7 mm diameter.   03/15/2021 Imaging   MR ABDOMEN MRCP W WO CONTAST   IMPRESSION: 1. Enhancing 2.6 x 2.2 x 3.3 cm mass in the head of the pancreas causing obstruction of the distal common bile duct resulting in intra and extrahepatic biliary ductal dilatation. Findings are highly concerning for primary pancreatic neoplasm. Further evaluation with endoscopic ultrasound is strongly recommended in the near future. 2. No definite signs of metastatic disease identified in the abdomen.    03/28/2021 Procedure   DG ERCP WITH SPHINCTEROTOMY  Performed by Dr. Watt Climes  FINDINGS: A total of 4 fluoroscopic spot images taken during ERCP are submitted for review. Initial images demonstrate scope overlying the upper abdomen with wire catheterization of the common bile duct. Contrast opacification of the common bile duct demonstrates a dilated appearance with abrupt shouldering compatible with obstruction. A metal CBD stent is then placed, and appears in grossly satisfactory position.   IMPRESSION: Fluoroscopic spot images taken during ERCP with metallic stent deployment as described. Refer to dedicated procedure report for full details.   03/28/2021 Procedure   UPPER ENDOSCOPIC ULTRASOUND  - There was dilation in the  common bile duct which measured up to 15 mm. - A few abnormal lymph nodes were visualized in the porta hepatis region. - A mass was identified in the pancreatic head. This was staged T3 N1 Mx by endosonographic criteria. Fine needle aspiration performed. - There was no evidence of significant pathology in the left lobe of the liver.  Findings: There was dilation in the common bile duct which measured up to 15 mm. A few abnormal lymph nodes were visualized in the porta hepatis region. The nodes were hypoechoic and had poorly defined margins. An irregular mass was identified in the pancreatic head. The mass was hypoechoic. The mass measured 25 mm by 20 mm in maximal cross-sectional diameter. The endosonographic borders were poorlydefined. An intact interface was seen between the mass and the superior mesenteric artery, portal vein, superior mesenteric vein and splenic vein suggesting a lack of invasion. The remainder of the pancreas was examined. The endosonographic appearance of parenchyma and the upstream pancreatic duct indicated duct dilation. Fine needle aspiration for cytology was performed. Color Doppler imaging was utilized prior to needle puncture to confirm a lack of significant vascular structures within the needle path. Three passes were made with the 25 gauge needle using a transduodenal approach. A stylet was used. A cytotechnologist was present to evaluate the adequacy of the specimen. Preliminary cytology is suspicious for adenocarcinoma (final results are pending).   03/28/2021 Pathology Results   CYTOLOGY - NON PAP  CASE: [id number]  PATIENT: Casey Pierce  Non-Gynecological Cytology Report   Clinical History: Obstructive jaudice  Specimen Submitted:  A. PANCREAS, HEAD, FINE NEEDLE ASPIRATION:    FINAL MICROSCOPIC DIAGNOSIS:  - Malignant cells consistent with adenocarcinoma    03/28/2021 Cancer  Staging   Staging form: Exocrine Pancreas, AJCC 8th Edition - Clinical  stage from 03/28/2021: Stage IIB (cT2, cN1, cM0) - Signed by Truitt Merle, MD on 04/06/2021 Stage prefix: Initial diagnosis    04/05/2021 Initial Diagnosis   Pancreatic cancer ([place])   04/19/2021 -  Chemotherapy   Patient is on Treatment Plan : PANCREAS Modified FOLFIRINOX q14d x 4 cycles       CURRENT THERAPY: Neoadjuvant FOLFIRINOX q2 weeks, starting 04/19/21. Irinotecan held with C1 due to hyperbilirubinemia. Added with dose-reduction with C2   INTERVAL HISTORY: Mr. Macchia returns with his spouse for follow-up and treatment as scheduled.  Began neoadjuvant FOLFIRINOX 04/19/2021, last seen by Dr. Burr Medico 04/25/2021 for toxicity check.  He met with genetic counselor yesterday.  Today he feels well overall.  His energy level was down for 4 days following treatment.  He had low appetite the day after injection.  Mild nausea without vomiting was managed with Compazine which he only took for a few days.  He was constipated following chemo, did not require medication and now back to normal.  He avoided cold exposure until a few days ago and could still feel some in his teeth.  Denies neuropathy in the absence of cold exposure.  Denies mucositis, rash, pain, fever, chills, cough, chest pain, dyspnea, bleeding on Eliquis, or other specific complaints.    MEDICAL HISTORY:  Past Medical History:  Diagnosis Date   Atrial fibrillation ([place])    Coronary artery disease    NSTEMI 12/1998 - LHC:  prox 85-90, RCA prox 30-50, EF 65-70 >> EHO:ZYYQ and BMS to LAD; large Dx jailed by stent (90%), EF 60  // Myoview 7/18: normal perfusion, Low Risk   History of echocardiogram    Echo 7/18: mild LVH, EF 60-65, no RWMA, mild MR, mod LAE   Myocardial infarction [facility])    Pancreatic cancer ([place])    Stroke ([place]) 04/2014    SURGICAL HISTORY: Past Surgical History:  Procedure Laterality Date   BILIARY STENT PLACEMENT N/A 03/28/2021   Procedure: BILIARY STENT PLACEMENT;  Surgeon: Clarene Essex, MD;  Location: [facility] END[facility];   Service: Endoscopy;  Laterality: N/A;   CARDIOVERSION N/A 06/20/2015   Procedure: CARDIOVERSION;  Surgeon: [name], MD;  Location: [facility];  Service: Cardiovascular;  Laterality: N/A;   COLONOSCOPY WITH PROPOFOL N/A 02/07/2015   Procedure: COLONOSCOPY WITH PROPOFOL;  Surgeon: [name], MD;  Location: [facility];  Service: Endoscopy;  Laterality: N/A;   CORONARY STENT PLACEMENT  2001   ERCP N/A 03/28/2021   Procedure: ENDOSCOPIC RETROGRADE CHOLANGIOPANCREATOGRAPHY (ERCP);  Surgeon: Clarene Essex, MD;  Location: [facility];  Service: Endoscopy;  Laterality: N/A;   ESOPHAGOGASTRODUODENOSCOPY N/A 03/28/2021   Procedure: ESOPHAGOGASTRODUODENOSCOPY (EGD);  Surgeon: Arta Silence, MD;  Location: [facility];  Service: Gastroenterology;  Laterality: N/A;   EUS N/A 03/28/2021   Procedure: FULL UPPER ENDOSCOPIC ULTRASOUND (EUS) RADIAL;  Surgeon: Arta Silence, MD;  Location: [facility];  Service: Gastroenterology;  Laterality: N/A;   FINE NEEDLE ASPIRATION N/A 03/28/2021   Procedure: FINE NEEDLE ASPIRATION (FNA) LINEAR;  Surgeon: Arta Silence, MD;  Location: [facility];  Service: Gastroenterology;  Laterality: N/A;   KNEE SURGERY  1972   PORTACATH PLACEMENT N/A 04/18/2021   Procedure: PORT PLACEMENT;  Surgeon: Stark Klein, MD;  Location: [facility];  Service: General;  Laterality: N/A;   SPHINCTEROTOMY  03/28/2021   Procedure: SPHINCTEROTOMY;  Surgeon: Clarene Essex, MD;  Location: [facility];  Service: Endoscopy;;    I have  reviewed the social history and family history with the patient and they are unchanged from previous note.  ALLERGIES:  has No Known Allergies.  MEDICATIONS:  Current Outpatient Medications  Medication Sig Dispense Refill   atorvastatin (LIPITOR) 10 MG tablet 1 tablet     ELIQUIS 5 MG TABS tablet TAKE 1 TABLET TWICE DAILY 180 tablet 0   famotidine (PEPCID) 20 MG tablet 1 tablet at bedtime as needed     lidocaine-prilocaine (EMLA)  cream Apply to affected area once 30 g 3   ondansetron (ZOFRAN) 8 MG tablet Take 1 tablet (8 mg total) by mouth 2 (two) times daily as needed. Start on day 3 after chemotherapy. 30 tablet 1   oxyCODONE (OXY IR/ROXICODONE) 5 MG immediate release tablet Take 1 tablet (5 mg total) by mouth every 6 (six) hours as needed for severe pain. 5 tablet 0   prochlorperazine (COMPAZINE) 10 MG tablet Take 1 tablet (10 mg total) by mouth every 6 (six) hours as needed (Nausea or vomiting). 30 tablet 1   No current facility-administered medications for this visit.    PHYSICAL EXAMINATION: ECOG PERFORMANCE STATUS: 1 - Symptomatic but completely ambulatory  Vitals:   05/02/21 0918  BP: 113/69  Pulse: 78  Resp: 18  Temp: 98.5 F (36.9 C)  SpO2: 100%   Filed Weights   05/02/21 0918  Weight: 161 lb 6.4 oz (73.2 kg)    GENERAL:alert, no distress and comfortable SKIN: No rash EYES: sclera clear NECK: Without mass LUNGS: clear to auscultation and percussion with normal breathing effort HEART: A-fib, no lower extremity edema ABDOMEN: abdomen soft, non-tender and normal bowel sounds NEURO: alert & oriented x 3 with fluent speech, no focal motor/sensory deficits PAC without erythema  LABORATORY DATA:  I have reviewed the data as listed CBC Latest Ref Rng & Units 05/01/2021 04/19/2021 09/05/2016  WBC 4.0 - 10.5 K/uL 16.0(H) 22.2(H) 9.9  Hemoglobin 13.0 - 17.0 g/dL 15.6 13.2 15.7  Hematocrit 39.0 - 52.0 % 45.3 38.8(L) 45.8  Platelets 150 - 400 K/uL 179 263 246     CMP Latest Ref Rng & Units 05/01/2021 04/19/2021 04/17/2021  Glucose 70 - 99 mg/dL 121(H) 96 -  BUN 8 - 23 mg/dL 12 11 -  Creatinine 0.61 - 1.24 mg/dL 0.81 0.63 0.80  Sodium 135 - 145 mmol/L 137 139 -  Potassium 3.5 - 5.1 mmol/L 4.4 3.4(L) -  Chloride 98 - 111 mmol/L 102 103 -  CO2 22 - 32 mmol/L 30 29 -  Calcium 8.9 - 10.3 mg/dL 9.6 9.3 -  Total Protein 6.5 - 8.1 g/dL 6.9 6.7 -  Total Bilirubin 0.3 - 1.2 mg/dL 2.1(H) 3.8(HH) -  Alkaline  Phos 38 - 126 U/L 137(H) 90 -  AST 15 - 41 U/L 27 19 -  ALT 0 - 44 U/L 46(H) 36 -      RADIOGRAPHIC STUDIES: I have personally reviewed the radiological images as listed and agreed with the findings in the report. No results found.   ASSESSMENT & PLAN: Eustace Hur. is a 66 y.o. male with    1. Pancreatic adenocarcinoma, Stage IIB, p(T2 N1)M0 -presented with painless obstructive jaundice in 02/2021. RUQ ultrasound and subsequently MRI of the abdomen 03/07/21 showed a 2.6 x 2.2 x 3.3 cm mass in the head of the pancreas causing obstruction of the distal common bile duct resulting in intra and extrahepatic biliary duct dilation. -ERCP and EUS with stent placement performed on 03/28/21, showed 2.5 cm mass, no  evidence of invasion. FNA of the pancreatic head lesion showed malignant cells consistent with adenocarcinoma. -he was seen by Dr. Barry Dienes on 04/10/21, who recommend neoadjuvant chemo followed by Whipple procedure. -staging chest CT 04/17/21 showed nonspecific small (4 mm or less) pulmonary nodules.  -port placed 04/18/21. He started neoadjuvant FOLFIRINOX on 04/19/21. Irinotecan was held due to elevated tbil  -He tolerated first cycle of chemotherapy overall well with mild fatigue, low appetite for 1 day, nausea, constipation, and cold sensitivity (10 days).  He recovered well by day 5. -Proceed with cycle 2 FOLFIRINOX today 05/02/2021, adding dose-reduced irinotecan   2. Genetics -Met with genetic counselor 05/01/2021 and underwent testing, results are pending   3. Comorbidities -Patient has history of CVA in 2016 and A-fib.  Denies bleeding on Eliquis -The patient has some numbness in his right palm, but is functional with his activities of daily living without any significant debility. -Denies new/worsening neuropathy  Disposition:  Mr. Kohrs appears stable. He completed cycle 1 neoadjuvant FOLFOX, irinotecan held for elevated Tbili. He tolerated treatment well with mild fatigue,  nausea, constipation, and cold sensitivity which lasted 10 days. Other side effects resolved by day 5 and were well managed with supportive care at home. He functions well.   Labs from 05/01/21 reviewed, mild transaminitis likely from oxali and leukocytosis from GCSF. Tbili is trending down 2.1 today. Labs adequate to proceed with cycle 2 today as planned. We are adding dose-reduced irinotecan. Potential SE's and symptom management reviewed.   F/up in 2 weeks with cycle 3.  The plan was reviewed with Dr. Burr Medico.   All questions were answered. The patient knows to call the clinic with any problems, questions or concerns. No barriers to learning were detected.      Alla Feeling, NP 05/02/21

## 2021-05-02 NOTE — Progress Notes (Signed)
Ok to treat today with T Bili 2.1 mg/dl per Cira Rue, NP

## 2021-05-02 NOTE — Telephone Encounter (Signed)
Forde Radon. Spouse Patty [phone]) called asking "what steps to take to obtain billing and records for cancer claim policy.  He signe release for  [employer] but we want records released to [place] to send.  I have the sheet provided with contact numbers for billing and (SW) H.I.M.  We'll need records every few months."   Reviewed Billing and [place] (SW) H.I.M protocols.   A zero balance is [place]'s requirement to release reports of billing information.  Ask [place] if they accept insurance EOB letters. Release for use disclosure is specific to named party.  New release required with his name if you wish to receive records. This office will be glad to forward request to (SW) H.I.M.  This department allowed thirty calendar days to comply upon receipt of H.I.P.A.A approved release. May request records through [facility] patient portal or on-line at [email or web address] under "Patient and Family Services" select "Medical Records" to obtain authorization release for use/disclosure form.     Release expires date of patient signature is why information before date of signature is all that will be released and a new release is required with each additional or new request for information.     "I will contact (SW) H.I.M.  If someone would give him a release form, tell him you spoke with me.  Will also reach out to insurance company to ask exactly what they need as that is a lot to request records monthly."

## 2021-05-02 NOTE — Patient Instructions (Signed)
[facility]   Discharge Instructions: Thank you for choosing [facility] to provide your oncology and hematology care.   If you have a lab appointment with the [facility], please go directly to the [facility] and check in at the registration area.   Wear comfortable clothing and clothing appropriate for easy access to any Portacath or PICC line.   We strive to give you quality time with your provider. You may need to reschedule your appointment if you arrive late (15 or more minutes).  Arriving late affects you and other patients whose appointments are after yours.  Also, if you miss three or more appointments without notifying the office, you may be dismissed from the clinic at the providers discretion.      For prescription refill requests, have your pharmacy contact our office and allow 72 hours for refills to be completed.    Today you received the following chemotherapy and/or immunotherapy agents: oxaliplatin, irinotecan, leucovorin, and fluorouracil      To help prevent nausea and vomiting after your treatment, we encourage you to take your nausea medication as directed.  BELOW ARE SYMPTOMS THAT SHOULD BE REPORTED IMMEDIATELY: *FEVER GREATER THAN 100.4 F (38 C) OR HIGHER *CHILLS OR SWEATING *NAUSEA AND VOMITING THAT IS NOT CONTROLLED WITH YOUR NAUSEA MEDICATION *UNUSUAL SHORTNESS OF BREATH *UNUSUAL BRUISING OR BLEEDING *URINARY PROBLEMS (pain or burning when urinating, or frequent urination) *BOWEL PROBLEMS (unusual diarrhea, constipation, pain near the anus) TENDERNESS IN MOUTH AND THROAT WITH OR WITHOUT PRESENCE OF ULCERS (sore throat, sores in mouth, or a toothache) UNUSUAL RASH, SWELLING OR PAIN  UNUSUAL VAGINAL DISCHARGE OR ITCHING   Items with * indicate a potential emergency and should be followed up as soon as possible or go to the Emergency Department if any problems should occur.  Please show the CHEMOTHERAPY ALERT  CARD or IMMUNOTHERAPY ALERT CARD at check-in to the Emergency Department and triage nurse.  Should you have questions after your visit or need to cancel or reschedule your appointment, please contact [facility]  Dept: [phone]  and follow the prompts.  Office hours are 8:00 a.m. to 4:30 p.m. Monday - Friday. Please note that voicemails left after 4:00 p.m. may not be returned until the following business day.  We are closed weekends and major holidays. You have access to a nurse at all times for urgent questions. Please call the main number to the clinic Dept: [phone] and follow the prompts.   For any non-urgent questions, you may also contact your provider using MyChart. We now offer e-Visits for anyone 77 and older to request care online for non-urgent symptoms. For details visit [email or web address].   Also download the MyChart app! Go to the app store, search "MyChart", open the app, select [facility], and log in with your MyChart username and password.  Due to Covid, a mask is required upon entering the hospital/clinic. If you do not have a mask, one will be given to you upon arrival. For doctor visits, patients may have 1 support person aged 78 or older with them. For treatment visits, patients cannot have anyone with them due to current Covid guidelines and our immunocompromised population.

## 2021-05-04 ENCOUNTER — Inpatient Hospital Stay: Payer: Medicare PPO

## 2021-05-04 ENCOUNTER — Other Ambulatory Visit: Payer: Self-pay

## 2021-05-04 ENCOUNTER — Ambulatory Visit: Payer: Medicare PPO

## 2021-05-04 VITALS — BP 103/77 | HR 87 | Temp 98.7°F | Resp 18

## 2021-05-04 DIAGNOSIS — I2510 Atherosclerotic heart disease of native coronary artery without angina pectoris: Secondary | ICD-10-CM | POA: Diagnosis not present

## 2021-05-04 DIAGNOSIS — C250 Malignant neoplasm of head of pancreas: Secondary | ICD-10-CM | POA: Diagnosis not present

## 2021-05-04 DIAGNOSIS — Z8673 Personal history of transient ischemic attack (TIA), and cerebral infarction without residual deficits: Secondary | ICD-10-CM | POA: Diagnosis not present

## 2021-05-04 DIAGNOSIS — Z5111 Encounter for antineoplastic chemotherapy: Secondary | ICD-10-CM | POA: Diagnosis not present

## 2021-05-04 DIAGNOSIS — Z7901 Long term (current) use of anticoagulants: Secondary | ICD-10-CM | POA: Diagnosis not present

## 2021-05-04 DIAGNOSIS — R110 Nausea: Secondary | ICD-10-CM | POA: Diagnosis not present

## 2021-05-04 DIAGNOSIS — I252 Old myocardial infarction: Secondary | ICD-10-CM | POA: Diagnosis not present

## 2021-05-04 DIAGNOSIS — I4891 Unspecified atrial fibrillation: Secondary | ICD-10-CM | POA: Diagnosis not present

## 2021-05-04 MED ORDER — PEGFILGRASTIM-CBQV 6 MG/0.6ML ~~LOC~~ SOSY
6.0000 mg | PREFILLED_SYRINGE | Freq: Once | SUBCUTANEOUS | Status: AC
  Administered 2021-05-04: 6 mg via SUBCUTANEOUS
  Filled 2021-05-04: qty 0.6

## 2021-05-12 MED FILL — Dexamethasone Sodium Phosphate Inj 100 MG/10ML: INTRAMUSCULAR | Qty: 1 | Status: AC

## 2021-05-12 MED FILL — Fosaprepitant Dimeglumine For IV Infusion 150 MG (Base Eq): INTRAVENOUS | Qty: 5 | Status: AC

## 2021-05-15 ENCOUNTER — Inpatient Hospital Stay: Payer: Medicare PPO

## 2021-05-15 ENCOUNTER — Other Ambulatory Visit: Payer: Self-pay

## 2021-05-15 ENCOUNTER — Encounter: Payer: Self-pay | Admitting: Hematology

## 2021-05-15 ENCOUNTER — Inpatient Hospital Stay: Payer: Medicare PPO | Admitting: Hematology

## 2021-05-15 ENCOUNTER — Telehealth: Payer: Self-pay | Admitting: Licensed Clinical Social Worker

## 2021-05-15 VITALS — BP 132/77 | HR 76 | Temp 98.1°F | Resp 18 | Wt 159.8 lb

## 2021-05-15 DIAGNOSIS — C250 Malignant neoplasm of head of pancreas: Secondary | ICD-10-CM

## 2021-05-15 DIAGNOSIS — R110 Nausea: Secondary | ICD-10-CM | POA: Diagnosis not present

## 2021-05-15 DIAGNOSIS — Z7901 Long term (current) use of anticoagulants: Secondary | ICD-10-CM | POA: Diagnosis not present

## 2021-05-15 DIAGNOSIS — I2510 Atherosclerotic heart disease of native coronary artery without angina pectoris: Secondary | ICD-10-CM | POA: Diagnosis not present

## 2021-05-15 DIAGNOSIS — Z5111 Encounter for antineoplastic chemotherapy: Secondary | ICD-10-CM | POA: Diagnosis not present

## 2021-05-15 DIAGNOSIS — I252 Old myocardial infarction: Secondary | ICD-10-CM | POA: Diagnosis not present

## 2021-05-15 DIAGNOSIS — I4891 Unspecified atrial fibrillation: Secondary | ICD-10-CM | POA: Diagnosis not present

## 2021-05-15 DIAGNOSIS — Z8673 Personal history of transient ischemic attack (TIA), and cerebral infarction without residual deficits: Secondary | ICD-10-CM | POA: Diagnosis not present

## 2021-05-15 LAB — CBC WITH DIFFERENTIAL (CANCER CENTER ONLY)
Abs Immature Granulocytes: 0.22 10*3/uL — ABNORMAL HIGH (ref 0.00–0.07)
Basophils Absolute: 0.2 10*3/uL — ABNORMAL HIGH (ref 0.0–0.1)
Basophils Relative: 1 %
Eosinophils Absolute: 0.3 10*3/uL (ref 0.0–0.5)
Eosinophils Relative: 2 %
HCT: 46.6 % (ref 39.0–52.0)
Hemoglobin: 16.2 g/dL (ref 13.0–17.0)
Immature Granulocytes: 1 %
Lymphocytes Relative: 14 %
Lymphs Abs: 2.5 10*3/uL (ref 0.7–4.0)
MCH: 31.9 pg (ref 26.0–34.0)
MCHC: 34.8 g/dL (ref 30.0–36.0)
MCV: 91.7 fL (ref 80.0–100.0)
Monocytes Absolute: 1.4 10*3/uL — ABNORMAL HIGH (ref 0.1–1.0)
Monocytes Relative: 8 %
Neutro Abs: 13.6 10*3/uL — ABNORMAL HIGH (ref 1.7–7.7)
Neutrophils Relative %: 74 %
Platelet Count: 210 10*3/uL (ref 150–400)
RBC: 5.08 MIL/uL (ref 4.22–5.81)
RDW: 15.1 % (ref 11.5–15.5)
WBC Count: 18.1 10*3/uL — ABNORMAL HIGH (ref 4.0–10.5)
nRBC: 0 % (ref 0.0–0.2)

## 2021-05-15 LAB — CMP (CANCER CENTER ONLY)
ALT: 52 U/L — ABNORMAL HIGH (ref 0–44)
AST: 30 U/L (ref 15–41)
Albumin: 3.8 g/dL (ref 3.5–5.0)
Alkaline Phosphatase: 112 U/L (ref 38–126)
Anion gap: 5 (ref 5–15)
BUN: 9 mg/dL (ref 8–23)
CO2: 30 mmol/L (ref 22–32)
Calcium: 9.6 mg/dL (ref 8.9–10.3)
Chloride: 102 mmol/L (ref 98–111)
Creatinine: 0.81 mg/dL (ref 0.61–1.24)
GFR, Estimated: >60 mL/min (ref >60)
Glucose, Bld: 99 mg/dL (ref 70–99)
Potassium: 4.1 mmol/L (ref 3.5–5.1)
Sodium: 137 mmol/L (ref 135–145)
Total Bilirubin: 1.1 mg/dL (ref 0.3–1.2)
Total Protein: 7 g/dL (ref 6.5–8.1)

## 2021-05-15 MED ORDER — DEXTROSE 5 % IV SOLN: Freq: Once | INTRAVENOUS | Status: AC

## 2021-05-15 MED ORDER — SODIUM CHLORIDE 0.9% FLUSH
10.0000 mL | Freq: Once | INTRAVENOUS | Status: AC
  Administered 2021-05-15: 10 mL

## 2021-05-15 MED ORDER — SODIUM CHLORIDE 0.9 % IV SOLN
150.0000 mg | Freq: Once | INTRAVENOUS | Status: AC
  Administered 2021-05-15: 150 mg via INTRAVENOUS
  Filled 2021-05-15: qty 150

## 2021-05-15 MED ORDER — PROCHLORPERAZINE MALEATE 10 MG PO TABS
10.0000 mg | ORAL_TABLET | Freq: Once | ORAL | Status: AC
  Administered 2021-05-15: 10 mg via ORAL
  Filled 2021-05-15: qty 1

## 2021-05-15 MED ORDER — SODIUM CHLORIDE 0.9 % IV SOLN
400.0000 mg/m2 | Freq: Once | INTRAVENOUS | Status: AC
  Administered 2021-05-15: 724 mg via INTRAVENOUS
  Filled 2021-05-15: qty 36.2

## 2021-05-15 MED ORDER — SODIUM CHLORIDE 0.9 % IV SOLN
10.0000 mg | Freq: Once | INTRAVENOUS | Status: AC
  Administered 2021-05-15: 10 mg via INTRAVENOUS
  Filled 2021-05-15: qty 10

## 2021-05-15 MED ORDER — ATROPINE SULFATE 1 MG/ML IV SOLN
0.5000 mg | Freq: Once | INTRAVENOUS | Status: AC | PRN
  Administered 2021-05-15: 0.5 mg via INTRAVENOUS
  Filled 2021-05-15: qty 1

## 2021-05-15 MED ORDER — OXALIPLATIN CHEMO INJECTION 100 MG/20ML
85.0000 mg/m2 | Freq: Once | INTRAVENOUS | Status: AC
  Administered 2021-05-15: 155 mg via INTRAVENOUS
  Filled 2021-05-15: qty 31

## 2021-05-15 MED ORDER — SODIUM CHLORIDE 0.9 % IV SOLN
150.0000 mg/m2 | Freq: Once | INTRAVENOUS | Status: AC
  Administered 2021-05-15: 280 mg via INTRAVENOUS
  Filled 2021-05-15: qty 14

## 2021-05-15 MED ORDER — ALTEPLASE 2 MG IJ SOLR
2.0000 mg | Freq: Once | INTRAMUSCULAR | Status: AC | PRN
  Administered 2021-05-15: 2 mg
  Filled 2021-05-15: qty 2

## 2021-05-15 MED ORDER — PALONOSETRON HCL INJECTION 0.25 MG/5ML
0.2500 mg | Freq: Once | INTRAVENOUS | Status: AC
  Administered 2021-05-15: 0.25 mg via INTRAVENOUS
  Filled 2021-05-15: qty 5

## 2021-05-15 MED ORDER — SODIUM CHLORIDE 0.9 % IV SOLN
2400.0000 mg/m2 | INTRAVENOUS | Status: DC
  Administered 2021-05-15: 4350 mg via INTRAVENOUS
  Filled 2021-05-15: qty 87

## 2021-05-15 NOTE — Progress Notes (Signed)
[facility]   [phone] Fax:([phone]   Clinic Follow up Note   Patient Care Team: Lavone Orn, MD as PCP - General (Internal Medicine) Deboraha Sprang, MD as PCP - Cardiology (Cardiology) Truitt Merle, MD as Consulting Physician (Oncology) Royston Bake, RN as Oncology Nurse Navigator (Oncology)  Date of Service:  05/15/2021  CHIEF COMPLAINT: f/u of pancreatic cancer  CURRENT THERAPY:  Neoadjuvant FOLFIRINOX q2 weeks, starting 04/19/21. Irinotecan held with C1 due to hyperbilirubinemia. Added with dose-reduction with C2   ASSESSMENT & PLAN:  Casey Borghi. is a 66 y.o. male with   1. Pancreatic adenocarcinoma, Stage IIB, p(T2 N1)M0 -presented with painless obstructive jaundice in 02/2021. RUQ ultrasound and subsequently MRI of the abdomen 03/07/21 showed a 2.6 x 2.2 x 3.3 cm mass in the head of the pancreas causing obstruction of the distal common bile duct resulting in intra and extrahepatic biliary duct dilation. -ERCP and EUS with stent placement performed on 03/28/21, showed 2.5 cm mass, no evidence of invasion. FNA of the pancreatic head lesion showed malignant cells consistent with adenocarcinoma. -he was seen by Dr. Barry Dienes on 04/10/21, who recommend neoadjuvant chemo followed by Whipple procedure. -staging chest CT 04/17/21 showed nonspecific small (4 mm or less) pulmonary nodules.  -port placed 04/18/21. He started neoadjuvant FOLFIRINOX on 04/19/21. Irinotecan was held due to elevated tbil  -He has tolerated chemotherapy overall well with mild fatigue, nausea, and cold sensitivity (10 days).  -labs reviewed, tbili is back to normal. Proceed with cycle 3 FOLFIRINOX today with full dose    2. Genetics -Met with genetic counselor 05/01/2021 and underwent testing, results are pending   3. Comorbidities -Patient has history of CVA in 2016 and A-fib.  Denies bleeding on Eliquis -The patient has some numbness in his right palm, but is functional with  his activities of daily living without any significant debility. -Denies new/worsening neuropathy   PLAN: -proceed with C3 mFOLFIRINOX today with full dose, GCSF onday 3 -lab, flush, f/u, and FOLFIRINOX in 2 weeks as scheduled   No problem-specific Assessment & Plan notes found for this encounter.   SUMMARY OF ONCOLOGIC HISTORY: Oncology History  Pancreatic cancer ([place])  03/03/2021 Imaging   US Abdomen Limited RUQ IMPRESSION: Probable fatty infiltration of liver as above.   Distended gallbladder without definite stones or wall thickening.   Borderline CBD dilatation, CBD 7 mm diameter.   03/15/2021 Imaging   MR ABDOMEN MRCP W WO CONTAST   IMPRESSION: 1. Enhancing 2.6 x 2.2 x 3.3 cm mass in the head of the pancreas causing obstruction of the distal common bile duct resulting in intra and extrahepatic biliary ductal dilatation. Findings are highly concerning for primary pancreatic neoplasm. Further evaluation with endoscopic ultrasound is strongly recommended in the near future. 2. No definite signs of metastatic disease identified in the abdomen.    03/28/2021 Procedure   DG ERCP WITH SPHINCTEROTOMY  Performed by Dr. Watt Climes  FINDINGS: A total of 4 fluoroscopic spot images taken during ERCP are submitted for review. Initial images demonstrate scope overlying the upper abdomen with wire catheterization of the common bile duct. Contrast opacification of the common bile duct demonstrates a dilated appearance with abrupt shouldering compatible with obstruction. A metal CBD stent is then placed, and appears in grossly satisfactory position.   IMPRESSION: Fluoroscopic spot images taken during ERCP with metallic stent deployment as described. Refer to dedicated procedure report for full details.   03/28/2021 Procedure   UPPER ENDOSCOPIC  ULTRASOUND  - There was dilation in the common bile duct which measured up to 15 mm. - A few abnormal lymph nodes were visualized in  the porta hepatis region. - A mass was identified in the pancreatic head. This was staged T3 N1 Mx by endosonographic criteria. Fine needle aspiration performed. - There was no evidence of significant pathology in the left lobe of the liver.  Findings: There was dilation in the common bile duct which measured up to 15 mm. A few abnormal lymph nodes were visualized in the porta hepatis region. The nodes were hypoechoic and had poorly defined margins. An irregular mass was identified in the pancreatic head. The mass was hypoechoic. The mass measured 25 mm by 20 mm in maximal cross-sectional diameter. The endosonographic borders were poorlydefined. An intact interface was seen between the mass and the superior mesenteric artery, portal vein, superior mesenteric vein and splenic vein suggesting a lack of invasion. The remainder of the pancreas was examined. The endosonographic appearance of parenchyma and the upstream pancreatic duct indicated duct dilation. Fine needle aspiration for cytology was performed. Color Doppler imaging was utilized prior to needle puncture to confirm a lack of significant vascular structures within the needle path. Three passes were made with the 25 gauge needle using a transduodenal approach. A stylet was used. A cytotechnologist was present to evaluate the adequacy of the specimen. Preliminary cytology is suspicious for adenocarcinoma (final results are pending).   03/28/2021 Pathology Results   CYTOLOGY - NON PAP  CASE: [id number]  PATIENT: Casey Pierce  Non-Gynecological Cytology Report   Clinical History: Obstructive jaudice  Specimen Submitted:  A. PANCREAS, HEAD, FINE NEEDLE ASPIRATION:    FINAL MICROSCOPIC DIAGNOSIS:  - Malignant cells consistent with adenocarcinoma    03/28/2021 Cancer Staging   Staging form: Exocrine Pancreas, AJCC 8th Edition - Clinical stage from 03/28/2021: Stage IIB (cT2, cN1, cM0) - Signed by Truitt Merle, MD on 04/06/2021 Stage  prefix: Initial diagnosis    04/05/2021 Initial Diagnosis   Pancreatic cancer ([place])   04/19/2021 -  Chemotherapy   Patient is on Treatment Plan : PANCREAS Modified FOLFIRINOX q14d x 4 cycles        INTERVAL HISTORY:  Casey Radon. is here for a follow up of pancreatic cancer. He was last seen by NP Lacie on 05/02/21. He presents to the clinic accompanied by his wife. He reports he tolerated treatment well overall. He notes he had one day of diarrhea. He also reports his cold sensitivity was moderate and lasted about 10 days.   All other systems were reviewed with the patient and are negative.  MEDICAL HISTORY:  Past Medical History:  Diagnosis Date   Atrial fibrillation ([place])    Coronary artery disease    NSTEMI 12/1998 - LHC:  prox 85-90, RCA prox 30-50, EF 65-70 >> JME:QAST and BMS to LAD; large Dx jailed by stent (90%), EF 60  // Myoview 7/18: normal perfusion, Low Risk   History of echocardiogram    Echo 7/18: mild LVH, EF 60-65, no RWMA, mild MR, mod LAE   Myocardial infarction [facility])    Pancreatic cancer ([place])    Stroke ([place]) 04/2014    SURGICAL HISTORY: Past Surgical History:  Procedure Laterality Date   BILIARY STENT PLACEMENT N/A 03/28/2021   Procedure: BILIARY STENT PLACEMENT;  Surgeon: Clarene Essex, MD;  Location: [facility];  Service: Endoscopy;  Laterality: N/A;   CARDIOVERSION N/A 06/20/2015   Procedure: CARDIOVERSION;  Surgeon: [name], MD;  Location: [facility];  Service: Cardiovascular;  Laterality: N/A;   COLONOSCOPY WITH PROPOFOL N/A 02/07/2015   Procedure: COLONOSCOPY WITH PROPOFOL;  Surgeon: [name], MD;  Location: [facility];  Service: Endoscopy;  Laterality: N/A;   CORONARY STENT PLACEMENT  2001   ERCP N/A 03/28/2021   Procedure: ENDOSCOPIC RETROGRADE CHOLANGIOPANCREATOGRAPHY (ERCP);  Surgeon: Clarene Essex, MD;  Location: [facility];  Service: Endoscopy;  Laterality: N/A;   ESOPHAGOGASTRODUODENOSCOPY N/A 03/28/2021   Procedure:  ESOPHAGOGASTRODUODENOSCOPY (EGD);  Surgeon: Arta Silence, MD;  Location: [facility];  Service: Gastroenterology;  Laterality: N/A;   EUS N/A 03/28/2021   Procedure: FULL UPPER ENDOSCOPIC ULTRASOUND (EUS) RADIAL;  Surgeon: Arta Silence, MD;  Location: [facility];  Service: Gastroenterology;  Laterality: N/A;   FINE NEEDLE ASPIRATION N/A 03/28/2021   Procedure: FINE NEEDLE ASPIRATION (FNA) LINEAR;  Surgeon: Arta Silence, MD;  Location: [facility];  Service: Gastroenterology;  Laterality: N/A;   KNEE SURGERY  1972   PORTACATH PLACEMENT N/A 04/18/2021   Procedure: PORT PLACEMENT;  Surgeon: Stark Klein, MD;  Location: [facility];  Service: General;  Laterality: N/A;   SPHINCTEROTOMY  03/28/2021   Procedure: SPHINCTEROTOMY;  Surgeon: Clarene Essex, MD;  Location: [facility];  Service: Endoscopy;;    I have reviewed the social history and family history with the patient and they are unchanged from previous note.  ALLERGIES:  has No Known Allergies.  MEDICATIONS:  Current Outpatient Medications  Medication Sig Dispense Refill   atorvastatin (LIPITOR) 10 MG tablet 1 tablet     ELIQUIS 5 MG TABS tablet TAKE 1 TABLET TWICE DAILY 180 tablet 0   famotidine (PEPCID) 20 MG tablet 1 tablet at bedtime as needed     lidocaine-prilocaine (EMLA) cream Apply to affected area once 30 g 3   ondansetron (ZOFRAN) 8 MG tablet Take 1 tablet (8 mg total) by mouth 2 (two) times daily as needed. Start on day 3 after chemotherapy. 30 tablet 1   oxyCODONE (OXY IR/ROXICODONE) 5 MG immediate release tablet Take 1 tablet (5 mg total) by mouth every 6 (six) hours as needed for severe pain. 5 tablet 0   prochlorperazine (COMPAZINE) 10 MG tablet Take 1 tablet (10 mg total) by mouth every 6 (six) hours as needed (Nausea or vomiting). 30 tablet 1   No current facility-administered medications for this visit.   Facility-Administered Medications Ordered in Other Visits  Medication Dose Route  Frequency Provider Last Rate Last Admin   fluorouracil (ADRUCIL) 4,350 mg in sodium chloride 0.9 % 63 mL chemo infusion  2,400 mg/m2 (Treatment Plan Recorded) Intravenous 1 day or 1 dose Truitt Merle, MD       irinotecan (CAMPTOSAR) 280 mg in sodium chloride 0.9 % 500 mL chemo infusion  150 mg/m2 (Treatment Plan Recorded) Intravenous Once Truitt Merle, MD 343 mL/hr at 05/15/21 1454 280 mg at 05/15/21 1454   leucovorin 724 mg in sodium chloride 0.9 % 250 mL infusion  400 mg/m2 (Treatment Plan Recorded) Intravenous Once Truitt Merle, MD 191 mL/hr at 05/15/21 1451 724 mg at 05/15/21 1451    PHYSICAL EXAMINATION: ECOG PERFORMANCE STATUS: 1 - Symptomatic but completely ambulatory  There were no vitals filed for this visit. Wt Readings from Last 3 Encounters:  05/15/21 159 lb 12 oz (72.5 kg)  05/02/21 161 lb 6.4 oz (73.2 kg)  04/19/21 162 lb 8 oz (73.7 kg)     GENERAL:alert, no distress and comfortable SKIN: skin color normal, no rashes or significant lesions EYES: normal, Conjunctiva are  pink and non-injected, sclera clear  NEURO: alert & oriented x 3 with fluent speech  LABORATORY DATA:  I have reviewed the data as listed CBC Latest Ref Rng & Units 05/15/2021 05/01/2021 04/19/2021  WBC 4.0 - 10.5 K/uL 18.1(H) 16.0(H) 22.2(H)  Hemoglobin 13.0 - 17.0 g/dL 16.2 15.6 13.2  Hematocrit 39.0 - 52.0 % 46.6 45.3 38.8(L)  Platelets 150 - 400 K/uL 210 179 263     CMP Latest Ref Rng & Units 05/15/2021 05/01/2021 04/19/2021  Glucose 70 - 99 mg/dL 99 121(H) 96  BUN 8 - 23 mg/dL _0 Creatinine 0.61 - 1.24 mg/dL 0.81 0.81 0.63  Sodium 135 - 145 mmol/L 137 137 139  Potassium 3.5 - 5.1 mmol/L 4.1 4.4 3.4(L)  Chloride 98 - 111 mmol/L 102 102 103  CO2 22 - 32 mmol/L _1 Calcium 8.9 - 10.3 mg/dL 9.6 9.6 9.3  Total Protein 6.5 - 8.1 g/dL 7.0 6.9 6.7  Total Bilirubin 0.3 - 1.2 mg/dL 1.1 2.1(H) 3.8(HH)  Alkaline Phos 38 - 126 U/L 112 137(H) 90  AST 15 - 41 U/L _2 ALT 0 - 44 U/L 52(H) 46(H) 36       RADIOGRAPHIC STUDIES: I have personally reviewed the radiological images as listed and agreed with the findings in the report. No results found.    No orders of the defined types were placed in this encounter.  All questions were answered. The patient knows to call the clinic with any problems, questions or concerns. No barriers to learning was detected.      Truitt Merle, MD 05/15/2021   I, Wilburn Mylar, am acting as scribe for Truitt Merle, MD.   I have reviewed the above documentation for accuracy and completeness, and I agree with the above.

## 2021-05-15 NOTE — Patient Instructions (Signed)
[facility]  Discharge Instructions: Thank you for choosing [facility] to provide your oncology and hematology care.   If you have a lab appointment with the [facility], please go directly to the [facility] and check in at the registration area.   Wear comfortable clothing and clothing appropriate for easy access to any Portacath or PICC line.   We strive to give you quality time with your provider. You may need to reschedule your appointment if you arrive late (15 or more minutes).  Arriving late affects you and other patients whose appointments are after yours.  Also, if you miss three or more appointments without notifying the office, you may be dismissed from the clinic at the providers discretion.      For prescription refill requests, have your pharmacy contact our office and allow 72 hours for refills to be completed.    Today you received the following chemotherapy and/or immunotherapy agents: Irinotecan, oxaliplatin, and fluorouracil       To help prevent nausea and vomiting after your treatment, we encourage you to take your nausea medication as directed.  BELOW ARE SYMPTOMS THAT SHOULD BE REPORTED IMMEDIATELY: *FEVER GREATER THAN 100.4 F (38 C) OR HIGHER *CHILLS OR SWEATING *NAUSEA AND VOMITING THAT IS NOT CONTROLLED WITH YOUR NAUSEA MEDICATION *UNUSUAL SHORTNESS OF BREATH *UNUSUAL BRUISING OR BLEEDING *URINARY PROBLEMS (pain or burning when urinating, or frequent urination) *BOWEL PROBLEMS (unusual diarrhea, constipation, pain near the anus) TENDERNESS IN MOUTH AND THROAT WITH OR WITHOUT PRESENCE OF ULCERS (sore throat, sores in mouth, or a toothache) UNUSUAL RASH, SWELLING OR PAIN  UNUSUAL VAGINAL DISCHARGE OR ITCHING   Items with * indicate a potential emergency and should be followed up as soon as possible or go to the Emergency Department if any problems should occur.  Please show the CHEMOTHERAPY ALERT CARD or  IMMUNOTHERAPY ALERT CARD at check-in to the Emergency Department and triage nurse.  Should you have questions after your visit or need to cancel or reschedule your appointment, please contact [facility]  Dept: [phone]  and follow the prompts.  Office hours are 8:00 a.m. to 4:30 p.m. Monday - Friday. Please note that voicemails left after 4:00 p.m. may not be returned until the following business day.  We are closed weekends and major holidays. You have access to a nurse at all times for urgent questions. Please call the main number to the clinic Dept: [phone] and follow the prompts.   For any non-urgent questions, you may also contact your provider using MyChart. We now offer e-Visits for anyone 67 and older to request care online for non-urgent symptoms. For details visit [email or web address].   Also download the MyChart app! Go to the app store, search "MyChart", open the app, select [facility], and log in with your MyChart username and password.  Due to Covid, a mask is required upon entering the hospital/clinic. If you do not have a mask, one will be given to you upon arrival. For doctor visits, patients may have 1 support person aged 67 or older with them. For treatment visits, patients cannot have anyone with them due to current Covid guidelines and our immunocompromised population.

## 2021-05-15 NOTE — Progress Notes (Signed)
Pt reported feeling very nauseas close to treatment end time. Per Dr Burr Medico, give compazine x1. Pt reports feeling some relief upon discharge.

## 2021-05-16 ENCOUNTER — Encounter: Payer: Self-pay | Admitting: Hematology

## 2021-05-16 ENCOUNTER — Ambulatory Visit: Payer: Self-pay | Admitting: Licensed Clinical Social Worker

## 2021-05-16 ENCOUNTER — Encounter: Payer: Self-pay | Admitting: Licensed Clinical Social Worker

## 2021-05-16 DIAGNOSIS — H52223 Regular astigmatism, bilateral: Secondary | ICD-10-CM | POA: Diagnosis not present

## 2021-05-16 DIAGNOSIS — Z1379 Encounter for other screening for genetic and chromosomal anomalies: Secondary | ICD-10-CM

## 2021-05-16 DIAGNOSIS — H524 Presbyopia: Secondary | ICD-10-CM | POA: Diagnosis not present

## 2021-05-16 DIAGNOSIS — H5213 Myopia, bilateral: Secondary | ICD-10-CM | POA: Diagnosis not present

## 2021-05-16 DIAGNOSIS — H401134 Primary open-angle glaucoma, bilateral, indeterminate stage: Secondary | ICD-10-CM | POA: Diagnosis not present

## 2021-05-16 NOTE — Progress Notes (Signed)
HPI:  Mr. Deem was previously seen in the [place] clinic due to a personal history of cancer and concerns regarding a hereditary predisposition to cancer. Please refer to our prior cancer genetics clinic note for more information regarding our discussion, assessment and recommendations, at the time. Mr. Schaller recent genetic test results were disclosed to him, as were recommendations warranted by these results. These results and recommendations are discussed in more detail below.  CANCER HISTORY:  Oncology History  Pancreatic cancer ([place])  03/03/2021 Imaging   US Abdomen Limited RUQ IMPRESSION: Probable fatty infiltration of liver as above.   Distended gallbladder without definite stones or wall thickening.   Borderline CBD dilatation, CBD 7 mm diameter.   03/15/2021 Imaging   MR ABDOMEN MRCP W WO CONTAST   IMPRESSION: 1. Enhancing 2.6 x 2.2 x 3.3 cm mass in the head of the pancreas causing obstruction of the distal common bile duct resulting in intra and extrahepatic biliary ductal dilatation. Findings are highly concerning for primary pancreatic neoplasm. Further evaluation with endoscopic ultrasound is strongly recommended in the near future. 2. No definite signs of metastatic disease identified in the abdomen.    03/28/2021 Procedure   DG ERCP WITH SPHINCTEROTOMY  Performed by Dr. Watt Climes  FINDINGS: A total of 4 fluoroscopic spot images taken during ERCP are submitted for review. Initial images demonstrate scope overlying the upper abdomen with wire catheterization of the common bile duct. Contrast opacification of the common bile duct demonstrates a dilated appearance with abrupt shouldering compatible with obstruction. A metal CBD stent is then placed, and appears in grossly satisfactory position.   IMPRESSION: Fluoroscopic spot images taken during ERCP with metallic stent deployment as described. Refer to dedicated procedure report for full  details.   03/28/2021 Procedure   UPPER ENDOSCOPIC ULTRASOUND  - There was dilation in the common bile duct which measured up to 15 mm. - A few abnormal lymph nodes were visualized in the porta hepatis region. - A mass was identified in the pancreatic head. This was staged T3 N1 Mx by endosonographic criteria. Fine needle aspiration performed. - There was no evidence of significant pathology in the left lobe of the liver.  Findings: There was dilation in the common bile duct which measured up to 15 mm. A few abnormal lymph nodes were visualized in the porta hepatis region. The nodes were hypoechoic and had poorly defined margins. An irregular mass was identified in the pancreatic head. The mass was hypoechoic. The mass measured 25 mm by 20 mm in maximal cross-sectional diameter. The endosonographic borders were poorlydefined. An intact interface was seen between the mass and the superior mesenteric artery, portal vein, superior mesenteric vein and splenic vein suggesting a lack of invasion. The remainder of the pancreas was examined. The endosonographic appearance of parenchyma and the upstream pancreatic duct indicated duct dilation. Fine needle aspiration for cytology was performed. Color Doppler imaging was utilized prior to needle puncture to confirm a lack of significant vascular structures within the needle path. Three passes were made with the 25 gauge needle using a transduodenal approach. A stylet was used. A cytotechnologist was present to evaluate the adequacy of the specimen. Preliminary cytology is suspicious for adenocarcinoma (final results are pending).   03/28/2021 Pathology Results   CYTOLOGY - NON PAP  CASE: [id number]  PATIENT: Rana Eick  Non-Gynecological Cytology Report   Clinical History: Obstructive jaudice  Specimen Submitted:  A. PANCREAS, HEAD, FINE NEEDLE ASPIRATION:    FINAL MICROSCOPIC DIAGNOSIS:  -  Malignant cells consistent with adenocarcinoma     03/28/2021 Cancer Staging   Staging form: Exocrine Pancreas, AJCC 8th Edition - Clinical stage from 03/28/2021: Stage IIB (cT2, cN1, cM0) - Signed by Truitt Merle, MD on 04/06/2021 Stage prefix: Initial diagnosis    04/05/2021 Initial Diagnosis   Pancreatic cancer ([place])   04/19/2021 -  Chemotherapy   Patient is on Treatment Plan : PANCREAS Modified FOLFIRINOX q14d x 4 cycles      Genetic Testing   Negative genetic testing. No pathogenic variants identified on the [facility] Multi-Cancer+RNA panel. The report date is 05/14/2021.  The Multi-Cancer Panel + RNA offered by [facility] includes sequencing and/or deletion duplication testing of the following 84 genes: AIP, ALK, APC, ATM, AXIN2,BAP1,  BARD1, BLM, BMPR1A, BRCA1, BRCA2, BRIP1, CASR, CDC73, CDH1, CDK4, CDKN1B, CDKN1C, CDKN2A (p14ARF), CDKN2A (p16INK4a), CEBPA, CHEK2, CTNNA1, DICER1, DIS3L2, EGFR (c.2369C>T, p.Thr790Met variant only), EPCAM (Deletion/duplication testing only), FH, FLCN, GATA2, GPC3, GREM1 (Promoter region deletion/duplication testing only), HOXB13 (c.251G>A, p.Gly84Glu), HRAS, KIT, MAX, MEN1, MET, MITF (c.952G>A, p.Glu318Lys variant only), MLH1, MSH2, MSH3, MSH6, MUTYH, NBN, NF1, NF2, NTHL1, PALB2, PDGFRA, PHOX2B, PMS2, POLD1, POLE, POT1, PRKAR1A, PTCH1, PTEN, RAD50, RAD51C, RAD51D, RB1, RECQL4, RET, RUNX1, SDHAF2, SDHA (sequence changes only), SDHB, SDHC, SDHD, SMAD4, SMARCA4, SMARCB1, SMARCE1, STK11, SUFU, TERC, TERT, TMEM127, TP53, TSC1, TSC2, VHL, WRN and WT1.     FAMILY HISTORY:  We obtained a detailed, 4-generation family history.  Significant diagnoses are listed below: Family History  Problem Relation Age of Onset   Cancer Father 34       tongue/neck   Heart attack Maternal Uncle    Cancer Paternal Uncle        unk type   Mr. Hoffmann has 3 daughters, no history of cancer. He has 3 paternal half brothers, 2 paternal half sisters, 1 maternal half sister.    Mr. Trefry mother is living at 64 and had skin cancer removed  from her calf at 75. Patient had 5 maternal uncles, no cancers. Maternal grandmother died at 15, grandfather died at 44.    Mr. Brink father died at 35 and has tongue/neck cancer and history of smoking/alcohol use. Patient had 2 paternal uncles, 2 paternal aunts. One uncle had cancer, unknown type and died at 54. Paternal grandmother died at 50, grandfather died at 31.   Mr. Feutz is unaware of previous family history of genetic testing for hereditary cancer risks. . There is no reported Ashkenazi Jewish ancestry. There is no known consanguinity.     GENETIC TEST RESULTS: Genetic testing reported out on 05/14/2021 through the [facility] Multi-Cancer+RNA cancer panel found no pathogenic mutations.   The Multi-Cancer Panel + RNA offered by [facility] includes sequencing and/or deletion duplication testing of the following 84 genes: AIP, ALK, APC, ATM, AXIN2,BAP1,  BARD1, BLM, BMPR1A, BRCA1, BRCA2, BRIP1, CASR, CDC73, CDH1, CDK4, CDKN1B, CDKN1C, CDKN2A (p14ARF), CDKN2A (p16INK4a), CEBPA, CHEK2, CTNNA1, DICER1, DIS3L2, EGFR (c.2369C>T, p.Thr790Met variant only), EPCAM (Deletion/duplication testing only), FH, FLCN, GATA2, GPC3, GREM1 (Promoter region deletion/duplication testing only), HOXB13 (c.251G>A, p.Gly84Glu), HRAS, KIT, MAX, MEN1, MET, MITF (c.952G>A, p.Glu318Lys variant only), MLH1, MSH2, MSH3, MSH6, MUTYH, NBN, NF1, NF2, NTHL1, PALB2, PDGFRA, PHOX2B, PMS2, POLD1, POLE, POT1, PRKAR1A, PTCH1, PTEN, RAD50, RAD51C, RAD51D, RB1, RECQL4, RET, RUNX1, SDHAF2, SDHA (sequence changes only), SDHB, SDHC, SDHD, SMAD4, SMARCA4, SMARCB1, SMARCE1, STK11, SUFU, TERC, TERT, TMEM127, TP53, TSC1, TSC2, VHL, WRN and WT1.  The test report has been scanned into EPIC and is located under the Molecular Pathology section of the  Results Review tab.  A portion of the result report is included below for reference.     We discussed that because current genetic testing is not perfect, it is possible there may be a gene mutation in  one of these genes that current testing cannot detect, but that chance is small.  There could be another gene that has not yet been discovered, or that we have not yet tested, that is responsible for the cancer diagnoses in the family. It is also possible there is a hereditary cause for the cancer in the family that Mr. Angert did not inherit and therefore was not identified in his testing.  Therefore, it is important to remain in touch with cancer genetics in the future so that we can continue to offer Mr. Rack the most up to date genetic testing.   ADDITIONAL GENETIC TESTING: We discussed with Mr. Wedge that his genetic testing was fairly extensive.  If there are genes identified to increase cancer risk that can be analyzed in the future, we would be happy to discuss and coordinate this testing at that time.    CANCER SCREENING RECOMMENDATIONS: Mr. Dec test result is considered negative (normal).  This means that we have not identified a hereditary cause for his  personal and family history of cancer at this time. Most cancers happen by chance and this negative test suggests that his cancer may fall into this category.    While reassuring, this does not definitively rule out a hereditary predisposition to cancer. It is still possible that there could be genetic mutations that are undetectable by current technology. There could be genetic mutations in genes that have not been tested or identified to increase cancer risk.  Therefore, it is recommended he continue to follow the cancer management and screening guidelines provided by his oncology and primary healthcare provider.   An individual's cancer risk and medical management are not determined by genetic test results alone. Overall cancer risk assessment incorporates additional factors, including personal medical history, family history, and any available genetic information that may result in a personalized plan for cancer prevention and  surveillance.  RECOMMENDATIONS FOR FAMILY MEMBERS:  Relatives in this family might be at some increased risk of developing cancer, over the general population risk, simply due to the family history of cancer.  We recommended male relatives in this family have a yearly mammogram beginning at age 36, or 89 years younger than the earliest onset of cancer, an annual clinical breast exam, and perform monthly breast self-exams. Male relatives in this family should also have a gynecological exam as recommended by their primary provider.  All family members should be referred for colonoscopy starting at age 33.   FOLLOW-UP: Lastly, we discussed with Mr. Prajapati that cancer genetics is a rapidly advancing field and it is possible that new genetic tests will be appropriate for him and/or his family members in the future. We encouraged him to remain in contact with cancer genetics on an annual basis so we can update his personal and family histories and let him know of advances in cancer genetics that may benefit this family.   Our contact number was provided. Mr. Pressly questions were answered to his satisfaction, and he knows he is welcome to call us at anytime with additional questions or concerns.   Faith Rogue, MS, [facility] Genetic Counselor [email or web address] Phone: [phone]

## 2021-05-16 NOTE — Telephone Encounter (Signed)
Revealed negative genetic testing.  This normal result is reassuring and indicates that it is unlikely Casey Pierce's cancer is due to a hereditary cause.  It is unlikely that there is an increased risk of another cancer due to a mutation in one of these genes.  However, genetic testing is not perfect, and cannot definitively rule out a hereditary cause.  It will be important for him to keep in contact with genetics to learn if any additional testing may be needed in the future.

## 2021-05-17 ENCOUNTER — Inpatient Hospital Stay: Payer: Medicare PPO | Attending: Physician Assistant

## 2021-05-17 ENCOUNTER — Other Ambulatory Visit: Payer: Self-pay

## 2021-05-17 ENCOUNTER — Ambulatory Visit: Payer: Medicare PPO

## 2021-05-17 VITALS — BP 112/73 | HR 62 | Temp 99.0°F | Resp 16

## 2021-05-17 DIAGNOSIS — Z5189 Encounter for other specified aftercare: Secondary | ICD-10-CM | POA: Diagnosis not present

## 2021-05-17 DIAGNOSIS — C250 Malignant neoplasm of head of pancreas: Secondary | ICD-10-CM | POA: Diagnosis not present

## 2021-05-17 DIAGNOSIS — Z5111 Encounter for antineoplastic chemotherapy: Secondary | ICD-10-CM | POA: Insufficient documentation

## 2021-05-17 MED ORDER — SODIUM CHLORIDE 0.9% FLUSH
10.0000 mL | INTRAVENOUS | Status: DC | PRN
  Administered 2021-05-17: 10 mL

## 2021-05-17 MED ORDER — HEPARIN SOD (PORK) LOCK FLUSH 100 UNIT/ML IV SOLN
500.0000 [IU] | Freq: Once | INTRAVENOUS | Status: AC | PRN
  Administered 2021-05-17: 500 [IU]

## 2021-05-17 MED ORDER — PEGFILGRASTIM-CBQV 6 MG/0.6ML ~~LOC~~ SOSY
6.0000 mg | PREFILLED_SYRINGE | Freq: Once | SUBCUTANEOUS | Status: AC
  Administered 2021-05-17: 6 mg via SUBCUTANEOUS
  Filled 2021-05-17: qty 0.6

## 2021-05-23 ENCOUNTER — Other Ambulatory Visit: Payer: Self-pay | Admitting: *Deleted

## 2021-05-23 DIAGNOSIS — I480 Paroxysmal atrial fibrillation: Secondary | ICD-10-CM

## 2021-05-23 DIAGNOSIS — Z8673 Personal history of transient ischemic attack (TIA), and cerebral infarction without residual deficits: Secondary | ICD-10-CM

## 2021-05-23 MED ORDER — APIXABAN 5 MG PO TABS: 5.0000 mg | ORAL_TABLET | Freq: Two times a day (BID) | ORAL | 1 refills | Status: DC

## 2021-05-23 NOTE — Telephone Encounter (Signed)
Eliquis '5mg'$  paper refill request received. Patient is 66 years old, weight-72.5kg, Crea-0.81 on 05/15/2021, Diagnosis-Afib, and last seen by Dr. [name] on 11/29/2020. Dose is appropriate based on dosing criteria. Will send in refill to requested pharmacy.   ?

## 2021-05-31 ENCOUNTER — Inpatient Hospital Stay: Payer: Medicare PPO | Admitting: Hematology

## 2021-05-31 ENCOUNTER — Other Ambulatory Visit: Payer: Self-pay

## 2021-05-31 ENCOUNTER — Inpatient Hospital Stay: Payer: Medicare PPO

## 2021-05-31 ENCOUNTER — Encounter: Payer: Self-pay | Admitting: Hematology

## 2021-05-31 VITALS — BP 121/93 | HR 84 | Temp 98.9°F | Resp 18 | Ht 65.0 in | Wt 160.7 lb

## 2021-05-31 DIAGNOSIS — C250 Malignant neoplasm of head of pancreas: Secondary | ICD-10-CM

## 2021-05-31 DIAGNOSIS — Z5111 Encounter for antineoplastic chemotherapy: Secondary | ICD-10-CM | POA: Diagnosis not present

## 2021-05-31 DIAGNOSIS — Z5189 Encounter for other specified aftercare: Secondary | ICD-10-CM | POA: Diagnosis not present

## 2021-05-31 LAB — CBC WITH DIFFERENTIAL (CANCER CENTER ONLY)
Abs Immature Granulocytes: 0.13 10*3/uL — ABNORMAL HIGH (ref 0.00–0.07)
Basophils Absolute: 0.1 10*3/uL (ref 0.0–0.1)
Basophils Relative: 1 %
Eosinophils Absolute: 0.1 10*3/uL (ref 0.0–0.5)
Eosinophils Relative: 1 %
HCT: 46 % (ref 39.0–52.0)
Hemoglobin: 15.9 g/dL (ref 13.0–17.0)
Immature Granulocytes: 1 %
Lymphocytes Relative: 22 %
Lymphs Abs: 2.5 10*3/uL (ref 0.7–4.0)
MCH: 31.9 pg (ref 26.0–34.0)
MCHC: 34.6 g/dL (ref 30.0–36.0)
MCV: 92.2 fL (ref 80.0–100.0)
Monocytes Absolute: 1.1 10*3/uL — ABNORMAL HIGH (ref 0.1–1.0)
Monocytes Relative: 9 %
Neutro Abs: 7.6 10*3/uL (ref 1.7–7.7)
Neutrophils Relative %: 66 %
Platelet Count: 206 10*3/uL (ref 150–400)
RBC: 4.99 MIL/uL (ref 4.22–5.81)
RDW: 15 % (ref 11.5–15.5)
WBC Count: 11.4 10*3/uL — ABNORMAL HIGH (ref 4.0–10.5)
nRBC: 0 % (ref 0.0–0.2)

## 2021-05-31 LAB — CMP (CANCER CENTER ONLY)
ALT: 52 U/L — ABNORMAL HIGH (ref 0–44)
AST: 32 U/L (ref 15–41)
Albumin: 3.8 g/dL (ref 3.5–5.0)
Alkaline Phosphatase: 92 U/L (ref 38–126)
Anion gap: 4 — ABNORMAL LOW (ref 5–15)
BUN: 8 mg/dL (ref 8–23)
CO2: 31 mmol/L (ref 22–32)
Calcium: 9.7 mg/dL (ref 8.9–10.3)
Chloride: 101 mmol/L (ref 98–111)
Creatinine: 0.69 mg/dL (ref 0.61–1.24)
GFR, Estimated: >60 mL/min (ref >60)
Glucose, Bld: 100 mg/dL — ABNORMAL HIGH (ref 70–99)
Potassium: 3.9 mmol/L (ref 3.5–5.1)
Sodium: 136 mmol/L (ref 135–145)
Total Bilirubin: 0.7 mg/dL (ref 0.3–1.2)
Total Protein: 7 g/dL (ref 6.5–8.1)

## 2021-05-31 MED ORDER — OXALIPLATIN CHEMO INJECTION 100 MG/20ML
85.0000 mg/m2 | Freq: Once | INTRAVENOUS | Status: AC
  Administered 2021-05-31: 155 mg via INTRAVENOUS
  Filled 2021-05-31: qty 31

## 2021-05-31 MED ORDER — SODIUM CHLORIDE 0.9% FLUSH
10.0000 mL | Freq: Once | INTRAVENOUS | Status: AC
  Administered 2021-05-31: 10 mL

## 2021-05-31 MED ORDER — PALONOSETRON HCL INJECTION 0.25 MG/5ML
0.2500 mg | Freq: Once | INTRAVENOUS | Status: AC
  Administered 2021-05-31: 0.25 mg via INTRAVENOUS
  Filled 2021-05-31: qty 5

## 2021-05-31 MED ORDER — SODIUM CHLORIDE 0.9 % IV SOLN
150.0000 mg | Freq: Once | INTRAVENOUS | Status: AC
  Administered 2021-05-31: 150 mg via INTRAVENOUS
  Filled 2021-05-31: qty 150

## 2021-05-31 MED ORDER — ATROPINE SULFATE 1 MG/ML IV SOLN
0.5000 mg | Freq: Once | INTRAVENOUS | Status: AC | PRN
  Administered 2021-05-31: 0.5 mg via INTRAVENOUS
  Filled 2021-05-31: qty 1

## 2021-05-31 MED ORDER — SODIUM CHLORIDE 0.9 % IV SOLN
400.0000 mg/m2 | Freq: Once | INTRAVENOUS | Status: AC
  Administered 2021-05-31: 724 mg via INTRAVENOUS
  Filled 2021-05-31: qty 36.2

## 2021-05-31 MED ORDER — DEXTROSE 5 % IV SOLN: Freq: Once | INTRAVENOUS | Status: AC

## 2021-05-31 MED ORDER — SODIUM CHLORIDE 0.9 % IV SOLN
2400.0000 mg/m2 | INTRAVENOUS | Status: DC
  Administered 2021-05-31: 4350 mg via INTRAVENOUS
  Filled 2021-05-31: qty 87

## 2021-05-31 MED ORDER — SODIUM CHLORIDE 0.9 % IV SOLN
10.0000 mg | Freq: Once | INTRAVENOUS | Status: AC
  Administered 2021-05-31: 10 mg via INTRAVENOUS
  Filled 2021-05-31: qty 10

## 2021-05-31 MED ORDER — SODIUM CHLORIDE 0.9% FLUSH
10.0000 mL | INTRAVENOUS | Status: DC | PRN
  Administered 2021-05-31: 10 mL

## 2021-05-31 MED ORDER — SODIUM CHLORIDE 0.9 % IV SOLN
150.0000 mg/m2 | Freq: Once | INTRAVENOUS | Status: AC
  Administered 2021-05-31: 280 mg via INTRAVENOUS
  Filled 2021-05-31: qty 14

## 2021-05-31 NOTE — Progress Notes (Signed)
?Casey Pierce   ?Telephone:([phone] [phone]   ?Clinic Follow up Note  ? ?Patient Care Team: ?Casey Orn, MD as PCP - General (Internal Medicine) ?Casey Sprang, MD as PCP - Cardiology (Cardiology) ?Casey Merle, MD as Consulting Physician (Oncology) ?Casey Pierce, Deliah Goody, RN as Sales executive (Oncology) ? ?Date of Service:  05/31/2021 ? ?CHIEF COMPLAINT: f/u of pancreatic cancer ? ?CURRENT THERAPY:  ?Neoadjuvant FOLFIRINOX q2 weeks, starting 04/19/21. Irinotecan held with C1 due to hyperbilirubinemia. Added on cycle 2  ? ?ASSESSMENT & PLAN:  ?Casey Pierce. is a 66 y.o. male with  ? ?1. Pancreatic adenocarcinoma, Stage IIB, p(T2 N1)M0 ?-presented with painless obstructive jaundice in 02/2021. RUQ ultrasound and subsequently MRI of the abdomen 03/07/21 showed a 2.6 x 2.2 x 3.3 cm mass in the head of the pancreas causing obstruction of the distal common bile duct resulting in intra and extrahepatic biliary duct dilation. ?-ERCP and EUS with stent placement performed on 03/28/21, showed 2.5 cm mass, no evidence of invasion. FNA of the pancreatic head lesion showed malignant cells consistent with adenocarcinoma. ?-he was seen by Dr. Barry Dienes on 04/10/21, who recommend neoadjuvant chemo followed by Whipple procedure. ?-staging chest CT 04/17/21 showed nonspecific small (4 mm or less) pulmonary nodules.  ?-port placed 04/18/21. He started neoadjuvant FOLFIRINOX on 04/19/21. Irinotecan was held due to elevated tbil  ?-He has tolerated chemotherapy overall well with mild fatigue, nausea, and cold sensitivity (10 days).  ?-labs reviewed, overall stable. Will proceed with cycle 4 mFOLFIRINOX today with full dose  ?-we discussed that we plan to do a scan after 6 cycles, then continue treatment for another 2 more cycles, followed by surgery. ?  ?2. Genetics ?-testing on 05/01/21 was negative. ?  ?3. Comorbidities ?-Patient has history of CVA in 2016 and A-fib.  Denies bleeding on Eliquis ?-The  patient has some numbness in his right palm, but is functional with his activities of daily living without any significant debility. ?-Denies new/worsening neuropathy ?  ?  ?PLAN: ?-proceed with C4 mFOLFIRINOX today with full dose ?-GCSF on day 3 ?-lab, flush, f/u, and FOLFIRINOX in 2 and 4 weeks  ?-plan for restaging CT after C6 ? ? ?No problem-specific Assessment & Plan notes found for this encounter. ? ? ?SUMMARY OF ONCOLOGIC HISTORY: ?Oncology History  ?Pancreatic cancer ([place])  ?03/03/2021 Imaging  ? US Abdomen Limited RUQ ?IMPRESSION: ?Probable fatty infiltration of liver as above. ?  ?Distended gallbladder without definite stones or wall thickening. ?  ?Borderline CBD dilatation, CBD 7 mm diameter. ?  ?03/15/2021 Imaging  ? MR ABDOMEN MRCP W WO CONTAST  ? ?IMPRESSION: ?1. Enhancing 2.6 x 2.2 x 3.3 cm mass in the head of the pancreas ?causing obstruction of the distal common bile duct resulting in ?intra and extrahepatic biliary ductal dilatation. Findings are ?highly concerning for primary pancreatic neoplasm. Further ?evaluation with endoscopic ultrasound is strongly recommended in the ?near future. ?2. No definite signs of metastatic disease identified in the ?abdomen. ? ?  ?03/28/2021 Procedure  ? DG ERCP WITH SPHINCTEROTOMY  ?Performed by Dr. Watt Climes ? ?FINDINGS: ?A total of 4 fluoroscopic spot images taken during ERCP are ?submitted for review. Initial images demonstrate scope overlying the ?upper abdomen with wire catheterization of the common bile duct. ?Contrast opacification of the common bile duct demonstrates a ?dilated appearance with abrupt shouldering compatible with ?obstruction. A metal CBD stent is then placed, and appears in ?grossly satisfactory position. ?  ?IMPRESSION: ?Fluoroscopic spot images taken  during ERCP with metallic stent ?deployment as described. Refer to dedicated procedure report for ?full details. ?  ?03/28/2021 Procedure  ? UPPER ENDOSCOPIC ULTRASOUND ? ?- There was dilation in  the common bile duct which measured up to 15 mm. ?- A few abnormal lymph nodes were visualized in the porta hepatis region. ?- A mass was identified in the pancreatic head. This was staged T3 N1 Mx by ?endosonographic criteria. Fine needle aspiration performed. ?- There was no evidence of significant pathology in the left lobe of the liver. ? ?Findings: ?There was dilation in the common bile duct which measured up to 15 mm. ?A few abnormal lymph nodes were visualized in the porta hepatis region. The nodes were hypoechoic and ?had poorly defined margins. ?An irregular mass was identified in the pancreatic head. The mass was hypoechoic. The mass measured ?25 mm by 20 mm in maximal cross-sectional diameter. The endosonographic borders were poorlydefined. ?An intact interface was seen between the mass and the superior mesenteric artery, portal vein, ?superior mesenteric vein and splenic vein suggesting a lack of invasion. The remainder of the pancreas ?was examined. The endosonographic appearance of parenchyma and the upstream pancreatic duct ?indicated duct dilation. Fine needle aspiration for cytology was performed. Color Doppler imaging was ?utilized prior to needle puncture to confirm a lack of significant vascular structures within the needle path. ?Three passes were made with the 25 gauge needle using a transduodenal approach. A stylet was used. ?A cytotechnologist was present to evaluate the adequacy of the specimen. Preliminary cytology is ?suspicious for adenocarcinoma (final results are pending). ?  ?03/28/2021 Pathology Results  ? CYTOLOGY - NON PAP  ?CASE: [id number]  ?PATIENT: Casey Pierce  ?Non-Gynecological Cytology Report  ? ?Clinical History: Obstructive jaudice  ?Specimen Submitted:  A. PANCREAS, HEAD, FINE NEEDLE ASPIRATION:  ? ? ?FINAL MICROSCOPIC DIAGNOSIS:  ?- Malignant cells consistent with adenocarcinoma  ?  ?03/28/2021 Cancer Staging  ? Staging form: Exocrine Pancreas, AJCC 8th Edition ?-  Clinical stage from 03/28/2021: Stage IIB (cT2, cN1, cM0) - Signed by Casey Merle, MD on 04/06/2021 ?Stage prefix: Initial diagnosis ? ?  ?04/05/2021 Initial Diagnosis  ? Pancreatic cancer [facility]) ?  ?04/19/2021 -  Chemotherapy  ? Patient is on Treatment Plan : PANCREAS Modified FOLFIRINOX q14d x 4 cycles  ?   ? Genetic Testing  ? Negative genetic testing. No pathogenic variants identified on the [facility] Multi-Cancer+RNA panel. The report date is 05/14/2021. ? ?The Multi-Cancer Panel + RNA offered by [facility] includes sequencing and/or deletion duplication testing of the following 84 genes: AIP, ALK, APC, ATM, AXIN2,BAP1,  BARD1, BLM, BMPR1A, BRCA1, BRCA2, BRIP1, CASR, CDC73, CDH1, CDK4, CDKN1B, CDKN1C, CDKN2A (p14ARF), CDKN2A (p16INK4a), CEBPA, CHEK2, CTNNA1, DICER1, DIS3L2, EGFR (c.2369C>T, p.Thr790Met variant only), EPCAM (Deletion/duplication testing only), FH, FLCN, GATA2, GPC3, GREM1 (Promoter region deletion/duplication testing only), HOXB13 (c.251G>A, p.Gly84Glu), HRAS, KIT, MAX, MEN1, MET, MITF (c.952G>A, p.Glu318Lys variant only), MLH1, MSH2, MSH3, MSH6, MUTYH, NBN, NF1, NF2, NTHL1, PALB2, PDGFRA, PHOX2B, PMS2, POLD1, POLE, POT1, PRKAR1A, PTCH1, PTEN, RAD50, RAD51C, RAD51D, RB1, RECQL4, RET, RUNX1, SDHAF2, SDHA (sequence changes only), SDHB, SDHC, SDHD, SMAD4, SMARCA4, SMARCB1, SMARCE1, STK11, SUFU, TERC, TERT, TMEM127, TP53, TSC1, TSC2, VHL, WRN and WT1. ?  ? ? ? ?INTERVAL HISTORY:  ?Forde Radon. is here for a follow up of pancreatic cancer. He was last seen by me on 05/15/21. He presents to the clinic accompanied by his wife. ?He reports he experienced some numbness to his lips and tongue during the last hour  of his infusion. He reports the cold sensitivity lasts a few days. He also reports nausea, well managed with medication, and denies vomiting. ?His wife notes that he feels much better since stent placement. He adds he felt worse from being jaundiced than he does on chemo. ?  ?All other systems were  reviewed with the patient and are negative. ? ?MEDICAL HISTORY:  ?Past Medical History:  ?Diagnosis Date  ? Atrial fibrillation ([place])   ? Coronary artery disease   ? NSTEMI 12/1998 - LHC:  prox 85-90, RCA prox 30-50,

## 2021-06-01 ENCOUNTER — Telehealth: Payer: Self-pay | Admitting: Hematology

## 2021-06-01 LAB — CANCER ANTIGEN 19-9: CA 19-9: 265 U/mL — ABNORMAL HIGH (ref 0–35)

## 2021-06-01 NOTE — Telephone Encounter (Signed)
Scheduled per 3/15 los, pt has been called and confirmed  ?

## 2021-06-02 ENCOUNTER — Ambulatory Visit: Payer: Medicare PPO

## 2021-06-02 ENCOUNTER — Inpatient Hospital Stay: Payer: Medicare PPO

## 2021-06-02 ENCOUNTER — Other Ambulatory Visit: Payer: Self-pay

## 2021-06-02 VITALS — BP 105/75 | HR 87 | Temp 98.4°F | Resp 18

## 2021-06-02 DIAGNOSIS — Z5189 Encounter for other specified aftercare: Secondary | ICD-10-CM | POA: Diagnosis not present

## 2021-06-02 DIAGNOSIS — Z95828 Presence of other vascular implants and grafts: Secondary | ICD-10-CM

## 2021-06-02 DIAGNOSIS — C250 Malignant neoplasm of head of pancreas: Secondary | ICD-10-CM

## 2021-06-02 DIAGNOSIS — Z5111 Encounter for antineoplastic chemotherapy: Secondary | ICD-10-CM | POA: Diagnosis not present

## 2021-06-02 MED ORDER — HEPARIN SOD (PORK) LOCK FLUSH 100 UNIT/ML IV SOLN
500.0000 [IU] | Freq: Once | INTRAVENOUS | Status: AC
  Administered 2021-06-02: 500 [IU] via INTRAVENOUS

## 2021-06-02 MED ORDER — SODIUM CHLORIDE 0.9% FLUSH
10.0000 mL | Freq: Once | INTRAVENOUS | Status: AC
  Administered 2021-06-02: 10 mL

## 2021-06-02 MED ORDER — PEGFILGRASTIM-CBQV 6 MG/0.6ML ~~LOC~~ SOSY
6.0000 mg | PREFILLED_SYRINGE | Freq: Once | SUBCUTANEOUS | Status: AC
  Administered 2021-06-02: 6 mg via SUBCUTANEOUS
  Filled 2021-06-02: qty 0.6

## 2021-06-14 ENCOUNTER — Inpatient Hospital Stay: Payer: Medicare PPO | Admitting: Hematology

## 2021-06-14 ENCOUNTER — Other Ambulatory Visit: Payer: Self-pay

## 2021-06-14 ENCOUNTER — Encounter: Payer: Self-pay | Admitting: Hematology

## 2021-06-14 ENCOUNTER — Inpatient Hospital Stay: Payer: Medicare PPO

## 2021-06-14 VITALS — BP 135/83 | HR 59 | Temp 98.2°F | Resp 18 | Ht 65.0 in | Wt 157.4 lb

## 2021-06-14 DIAGNOSIS — C250 Malignant neoplasm of head of pancreas: Secondary | ICD-10-CM

## 2021-06-14 DIAGNOSIS — Z5111 Encounter for antineoplastic chemotherapy: Secondary | ICD-10-CM | POA: Diagnosis not present

## 2021-06-14 DIAGNOSIS — Z5189 Encounter for other specified aftercare: Secondary | ICD-10-CM | POA: Diagnosis not present

## 2021-06-14 LAB — CBC WITH DIFFERENTIAL (CANCER CENTER ONLY)
Abs Immature Granulocytes: 0.1 10*3/uL — ABNORMAL HIGH (ref 0.00–0.07)
Basophils Absolute: 0.1 10*3/uL (ref 0.0–0.1)
Basophils Relative: 1 %
Eosinophils Absolute: 0.1 10*3/uL (ref 0.0–0.5)
Eosinophils Relative: 1 %
HCT: 43.7 % (ref 39.0–52.0)
Hemoglobin: 15.5 g/dL (ref 13.0–17.0)
Immature Granulocytes: 1 %
Lymphocytes Relative: 18 %
Lymphs Abs: 2.2 10*3/uL (ref 0.7–4.0)
MCH: 31.8 pg (ref 26.0–34.0)
MCHC: 35.5 g/dL (ref 30.0–36.0)
MCV: 89.7 fL (ref 80.0–100.0)
Monocytes Absolute: 1.2 10*3/uL — ABNORMAL HIGH (ref 0.1–1.0)
Monocytes Relative: 11 %
Neutro Abs: 8.1 10*3/uL — ABNORMAL HIGH (ref 1.7–7.7)
Neutrophils Relative %: 68 %
Platelet Count: 211 10*3/uL (ref 150–400)
RBC: 4.87 MIL/uL (ref 4.22–5.81)
RDW: 14.9 % (ref 11.5–15.5)
WBC Count: 11.7 10*3/uL — ABNORMAL HIGH (ref 4.0–10.5)
nRBC: 0 % (ref 0.0–0.2)

## 2021-06-14 LAB — CMP (CANCER CENTER ONLY)
ALT: 56 U/L — ABNORMAL HIGH (ref 0–44)
AST: 36 U/L (ref 15–41)
Albumin: 3.6 g/dL (ref 3.5–5.0)
Alkaline Phosphatase: 92 U/L (ref 38–126)
Anion gap: 6 (ref 5–15)
BUN: 6 mg/dL — ABNORMAL LOW (ref 8–23)
CO2: 29 mmol/L (ref 22–32)
Calcium: 9.4 mg/dL (ref 8.9–10.3)
Chloride: 103 mmol/L (ref 98–111)
Creatinine: 0.63 mg/dL (ref 0.61–1.24)
GFR, Estimated: >60 mL/min (ref >60)
Glucose, Bld: 107 mg/dL — ABNORMAL HIGH (ref 70–99)
Potassium: 3.8 mmol/L (ref 3.5–5.1)
Sodium: 138 mmol/L (ref 135–145)
Total Bilirubin: 0.5 mg/dL (ref 0.3–1.2)
Total Protein: 6.4 g/dL — ABNORMAL LOW (ref 6.5–8.1)

## 2021-06-14 MED ORDER — SODIUM CHLORIDE 0.9 % IV SOLN
2400.0000 mg/m2 | INTRAVENOUS | Status: DC
  Administered 2021-06-14: 4350 mg via INTRAVENOUS
  Filled 2021-06-14: qty 87

## 2021-06-14 MED ORDER — ATROPINE SULFATE 1 MG/ML IV SOLN
0.5000 mg | Freq: Once | INTRAVENOUS | Status: AC | PRN
  Administered 2021-06-14: 0.5 mg via INTRAVENOUS
  Filled 2021-06-14: qty 1

## 2021-06-14 MED ORDER — SODIUM CHLORIDE 0.9 % IV SOLN
10.0000 mg | Freq: Once | INTRAVENOUS | Status: AC
  Administered 2021-06-14: 10 mg via INTRAVENOUS
  Filled 2021-06-14: qty 10

## 2021-06-14 MED ORDER — PROCHLORPERAZINE MALEATE 10 MG PO TABS: 10.0000 mg | ORAL_TABLET | Freq: Four times a day (QID) | ORAL | 1 refills | Status: DC | PRN

## 2021-06-14 MED ORDER — SODIUM CHLORIDE 0.9 % IV SOLN
150.0000 mg/m2 | Freq: Once | INTRAVENOUS | Status: AC
  Administered 2021-06-14: 280 mg via INTRAVENOUS
  Filled 2021-06-14: qty 14

## 2021-06-14 MED ORDER — SODIUM CHLORIDE 0.9 % IV SOLN
400.0000 mg/m2 | Freq: Once | INTRAVENOUS | Status: AC
  Administered 2021-06-14: 724 mg via INTRAVENOUS
  Filled 2021-06-14: qty 36.2

## 2021-06-14 MED ORDER — PALONOSETRON HCL INJECTION 0.25 MG/5ML
0.2500 mg | Freq: Once | INTRAVENOUS | Status: AC
  Administered 2021-06-14: 0.25 mg via INTRAVENOUS
  Filled 2021-06-14: qty 5

## 2021-06-14 MED ORDER — SODIUM CHLORIDE 0.9 % IV SOLN
150.0000 mg | Freq: Once | INTRAVENOUS | Status: AC
  Administered 2021-06-14: 150 mg via INTRAVENOUS
  Filled 2021-06-14: qty 150

## 2021-06-14 MED ORDER — DEXTROSE 5 % IV SOLN: Freq: Once | INTRAVENOUS | Status: AC

## 2021-06-14 MED ORDER — OXALIPLATIN CHEMO INJECTION 100 MG/20ML
85.0000 mg/m2 | Freq: Once | INTRAVENOUS | Status: AC
  Administered 2021-06-14: 155 mg via INTRAVENOUS
  Filled 2021-06-14: qty 31

## 2021-06-14 MED ORDER — SODIUM CHLORIDE 0.9% FLUSH
10.0000 mL | Freq: Once | INTRAVENOUS | Status: AC
  Administered 2021-06-14: 10 mL

## 2021-06-14 NOTE — Progress Notes (Signed)
?[place]   ?[phone] [phone]   ?Clinic Follow up Note  ? ?Patient Care Team: ?Lavone Orn, MD as PCP - General (Internal Medicine) ?Deboraha Sprang, MD as PCP - Cardiology (Cardiology) ?Truitt Merle, MD as Consulting Physician (Oncology) ?Earl Gala, Deliah Goody, RN as Sales executive (Oncology) ? ?Date of Service:  06/14/2021 ? ?CHIEF COMPLAINT: f/u of pancreatic cancer ? ?CURRENT THERAPY:  ?Neoadjuvant FOLFIRINOX q2 weeks, starting 04/19/21. Irinotecan held with C1 due to hyperbilirubinemia. ? ?ASSESSMENT & PLAN:  ?Casey Pierce. is a 66 y.o. male with  ? ?1. Pancreatic adenocarcinoma, Stage IIB, p(T2 N1)M0 ?-presented with painless obstructive jaundice in 02/2021. RUQ ultrasound and subsequently MRI of the abdomen 03/07/21 showed a 3.3 cm mass in pancreatic head causing obstruction of the distal common bile duct resulting in hepatic biliary duct dilation. ?-ERCP and EUS with stent placement performed on 03/28/21, showed 2.5 cm mass, no evidence of invasion. FNA of the pancreatic head lesion showed malignant cells consistent with adenocarcinoma. ?-he was seen by Dr. Barry Dienes on 04/10/21, who recommend neoadjuvant chemo followed by Whipple procedure. ?-staging chest CT 04/17/21 showed nonspecific small (4 mm or less) pulmonary nodules.  ?-port placed 04/18/21. He started neoadjuvant FOLFIRINOX on 04/19/21. Irinotecan was held due to elevated tbil  ?-He has tolerated chemotherapy overall well with mild fatigue, nausea, and cold sensitivity, but able to recover well  ?-labs reviewed, overall stable. Will proceed with cycle 5 mFOLFIRINOX today with full dose. ?-we discussed that we plan to do a scan after 6 cycles, then continue treatment for another 2 more cycles, followed by surgery. I ordered his scan today. ?  ?2. Genetics ?-testing on 05/01/21 was negative. ?  ?3. Comorbidities ?-Patient has history of CVA in 2016 and A-fib.  Denies bleeding on Eliquis ?-The patient has some  numbness in his right palm, but is functional with his activities of daily living without any significant debility. ?-Denies new/worsening neuropathy ?  ?  ?PLAN: ?-proceed with C5 mFOLFIRINOX today with full dose ?-GCSF on day 3 ?-I refilled compazine ?-lab, flush, f/u, and FOLFIRINOX in 2 weeks  ?-plan for restaging CT after C6 ? ? ?No problem-specific Assessment & Plan notes found for this encounter. ? ? ?SUMMARY OF ONCOLOGIC HISTORY: ?Oncology History  ?Pancreatic cancer ([place])  ?03/03/2021 Imaging  ? US Abdomen Limited RUQ ?IMPRESSION: ?Probable fatty infiltration of liver as above. ?  ?Distended gallbladder without definite stones or wall thickening. ?  ?Borderline CBD dilatation, CBD 7 mm diameter. ?  ?03/15/2021 Imaging  ? MR ABDOMEN MRCP W WO CONTAST  ? ?IMPRESSION: ?1. Enhancing 2.6 x 2.2 x 3.3 cm mass in the head of the pancreas ?causing obstruction of the distal common bile duct resulting in ?intra and extrahepatic biliary ductal dilatation. Findings are ?highly concerning for primary pancreatic neoplasm. Further ?evaluation with endoscopic ultrasound is strongly recommended in the ?near future. ?2. No definite signs of metastatic disease identified in the ?abdomen. ? ?  ?03/28/2021 Procedure  ? DG ERCP WITH SPHINCTEROTOMY  ?Performed by Dr. Watt Climes ? ?FINDINGS: ?A total of 4 fluoroscopic spot images taken during ERCP are ?submitted for review. Initial images demonstrate scope overlying the ?upper abdomen with wire catheterization of the common bile duct. ?Contrast opacification of the common bile duct demonstrates a ?dilated appearance with abrupt shouldering compatible with ?obstruction. A metal CBD stent is then placed, and appears in ?grossly satisfactory position. ?  ?IMPRESSION: ?Fluoroscopic spot images taken during ERCP with metallic stent ?deployment  as described. Refer to dedicated procedure report for ?full details. ?  ?03/28/2021 Procedure  ? UPPER ENDOSCOPIC ULTRASOUND ? ?- There was dilation in  the common bile duct which measured up to 15 mm. ?- A few abnormal lymph nodes were visualized in the porta hepatis region. ?- A mass was identified in the pancreatic head. This was staged T3 N1 Mx by ?endosonographic criteria. Fine needle aspiration performed. ?- There was no evidence of significant pathology in the left lobe of the liver. ? ?Findings: ?There was dilation in the common bile duct which measured up to 15 mm. ?A few abnormal lymph nodes were visualized in the porta hepatis region. The nodes were hypoechoic and ?had poorly defined margins. ?An irregular mass was identified in the pancreatic head. The mass was hypoechoic. The mass measured ?25 mm by 20 mm in maximal cross-sectional diameter. The endosonographic borders were poorlydefined. ?An intact interface was seen between the mass and the superior mesenteric artery, portal vein, ?superior mesenteric vein and splenic vein suggesting a lack of invasion. The remainder of the pancreas ?was examined. The endosonographic appearance of parenchyma and the upstream pancreatic duct ?indicated duct dilation. Fine needle aspiration for cytology was performed. Color Doppler imaging was ?utilized prior to needle puncture to confirm a lack of significant vascular structures within the needle path. ?Three passes were made with the 25 gauge needle using a transduodenal approach. A stylet was used. ?A cytotechnologist was present to evaluate the adequacy of the specimen. Preliminary cytology is ?suspicious for adenocarcinoma (final results are pending). ?  ?03/28/2021 Pathology Results  ? CYTOLOGY - NON PAP  ?CASE: [id number]  ?PATIENT: Casey Pierce  ?Non-Gynecological Cytology Report  ? ?Clinical History: Obstructive jaudice  ?Specimen Submitted:  A. PANCREAS, HEAD, FINE NEEDLE ASPIRATION:  ? ? ?FINAL MICROSCOPIC DIAGNOSIS:  ?- Malignant cells consistent with adenocarcinoma  ?  ?03/28/2021 Cancer Staging  ? Staging form: Exocrine Pancreas, AJCC 8th Edition ?-  Clinical stage from 03/28/2021: Stage IIB (cT2, cN1, cM0) - Signed by Truitt Merle, MD on 04/06/2021 ?Stage prefix: Initial diagnosis ? ?  ?04/05/2021 Initial Diagnosis  ? Pancreatic cancer [facility]) ?  ?04/19/2021 -  Chemotherapy  ? Patient is on Treatment Plan : PANCREAS Modified FOLFIRINOX q14d x 4 cycles  ?   ? Genetic Testing  ? Negative genetic testing. No pathogenic variants identified on the [facility] Multi-Cancer+RNA panel. The report date is 05/14/2021. ? ?The Multi-Cancer Panel + RNA offered by [facility] includes sequencing and/or deletion duplication testing of the following 84 genes: AIP, ALK, APC, ATM, AXIN2,BAP1,  BARD1, BLM, BMPR1A, BRCA1, BRCA2, BRIP1, CASR, CDC73, CDH1, CDK4, CDKN1B, CDKN1C, CDKN2A (p14ARF), CDKN2A (p16INK4a), CEBPA, CHEK2, CTNNA1, DICER1, DIS3L2, EGFR (c.2369C>T, p.Thr790Met variant only), EPCAM (Deletion/duplication testing only), FH, FLCN, GATA2, GPC3, GREM1 (Promoter region deletion/duplication testing only), HOXB13 (c.251G>A, p.Gly84Glu), HRAS, KIT, MAX, MEN1, MET, MITF (c.952G>A, p.Glu318Lys variant only), MLH1, MSH2, MSH3, MSH6, MUTYH, NBN, NF1, NF2, NTHL1, PALB2, PDGFRA, PHOX2B, PMS2, POLD1, POLE, POT1, PRKAR1A, PTCH1, PTEN, RAD50, RAD51C, RAD51D, RB1, RECQL4, RET, RUNX1, SDHAF2, SDHA (sequence changes only), SDHB, SDHC, SDHD, SMAD4, SMARCA4, SMARCB1, SMARCE1, STK11, SUFU, TERC, TERT, TMEM127, TP53, TSC1, TSC2, VHL, WRN and WT1. ?  ? ? ? ?INTERVAL HISTORY:  ?Forde Radon. is here for a follow up of pancreatic cancer. He was last seen by me on 05/31/21. He presents to the clinic accompanied by his wife. ?He reports a few days of no appetite following treatment. He notes he has recovered well. ?  ?All other systems  were reviewed with the patient and are negative. ? ?MEDICAL HISTORY:  ?Past Medical History:  ?Diagnosis Date  ? Atrial fibrillation ([place])   ? Coronary artery disease   ? NSTEMI 12/1998 - LHC:  prox 85-90, RCA prox 30-50, EF 65-70 >> WLS:LHTD and BMS to LAD; large Dx jailed by  stent (90%), EF 60  // Myoview 7/18: normal perfusion, Low Risk  ? History of echocardiogram   ? Echo 7/18: mild LVH, EF 60-65, no RWMA, mild MR, mod LAE  ? Myocardial infarction [facility])   ? Pancreatic cancer

## 2021-06-16 ENCOUNTER — Inpatient Hospital Stay: Payer: Medicare PPO

## 2021-06-16 ENCOUNTER — Other Ambulatory Visit: Payer: Self-pay

## 2021-06-16 VITALS — BP 108/78 | HR 79 | Temp 97.8°F | Resp 18

## 2021-06-16 DIAGNOSIS — C250 Malignant neoplasm of head of pancreas: Secondary | ICD-10-CM | POA: Diagnosis not present

## 2021-06-16 DIAGNOSIS — Z5111 Encounter for antineoplastic chemotherapy: Secondary | ICD-10-CM | POA: Diagnosis not present

## 2021-06-16 DIAGNOSIS — Z5189 Encounter for other specified aftercare: Secondary | ICD-10-CM | POA: Diagnosis not present

## 2021-06-16 MED ORDER — SODIUM CHLORIDE 0.9% FLUSH
10.0000 mL | Freq: Once | INTRAVENOUS | Status: AC
  Administered 2021-06-16: 10 mL

## 2021-06-16 MED ORDER — HEPARIN SOD (PORK) LOCK FLUSH 100 UNIT/ML IV SOLN
500.0000 [IU] | Freq: Once | INTRAVENOUS | Status: AC | PRN
  Administered 2021-06-16: 500 [IU]

## 2021-06-16 MED ORDER — PEGFILGRASTIM-CBQV 6 MG/0.6ML ~~LOC~~ SOSY
6.0000 mg | PREFILLED_SYRINGE | Freq: Once | SUBCUTANEOUS | Status: AC
  Administered 2021-06-16: 6 mg via SUBCUTANEOUS
  Filled 2021-06-16: qty 0.6

## 2021-06-28 ENCOUNTER — Encounter: Payer: Self-pay | Admitting: Hematology

## 2021-06-28 ENCOUNTER — Inpatient Hospital Stay: Payer: Medicare PPO | Admitting: Hematology

## 2021-06-28 ENCOUNTER — Inpatient Hospital Stay: Payer: Medicare PPO | Attending: Physician Assistant

## 2021-06-28 ENCOUNTER — Other Ambulatory Visit: Payer: Self-pay

## 2021-06-28 ENCOUNTER — Inpatient Hospital Stay: Payer: Medicare PPO

## 2021-06-28 VITALS — BP 122/94 | HR 93 | Temp 97.5°F | Resp 18 | Wt 156.0 lb

## 2021-06-28 DIAGNOSIS — Z5111 Encounter for antineoplastic chemotherapy: Secondary | ICD-10-CM | POA: Insufficient documentation

## 2021-06-28 DIAGNOSIS — Z8673 Personal history of transient ischemic attack (TIA), and cerebral infarction without residual deficits: Secondary | ICD-10-CM | POA: Diagnosis not present

## 2021-06-28 DIAGNOSIS — Z7901 Long term (current) use of anticoagulants: Secondary | ICD-10-CM | POA: Diagnosis not present

## 2021-06-28 DIAGNOSIS — C250 Malignant neoplasm of head of pancreas: Secondary | ICD-10-CM | POA: Diagnosis not present

## 2021-06-28 DIAGNOSIS — I252 Old myocardial infarction: Secondary | ICD-10-CM | POA: Insufficient documentation

## 2021-06-28 DIAGNOSIS — I4891 Unspecified atrial fibrillation: Secondary | ICD-10-CM | POA: Diagnosis not present

## 2021-06-28 DIAGNOSIS — I2510 Atherosclerotic heart disease of native coronary artery without angina pectoris: Secondary | ICD-10-CM | POA: Diagnosis not present

## 2021-06-28 LAB — CMP (CANCER CENTER ONLY)
ALT: 65 U/L — ABNORMAL HIGH (ref 0–44)
AST: 43 U/L — ABNORMAL HIGH (ref 15–41)
Albumin: 3.5 g/dL (ref 3.5–5.0)
Alkaline Phosphatase: 99 U/L (ref 38–126)
Anion gap: 5 (ref 5–15)
BUN: 6 mg/dL — ABNORMAL LOW (ref 8–23)
CO2: 30 mmol/L (ref 22–32)
Calcium: 9.1 mg/dL (ref 8.9–10.3)
Chloride: 102 mmol/L (ref 98–111)
Creatinine: 0.69 mg/dL (ref 0.61–1.24)
GFR, Estimated: >60 mL/min (ref >60)
Glucose, Bld: 102 mg/dL — ABNORMAL HIGH (ref 70–99)
Potassium: 3.6 mmol/L (ref 3.5–5.1)
Sodium: 137 mmol/L (ref 135–145)
Total Bilirubin: 0.5 mg/dL (ref 0.3–1.2)
Total Protein: 6.5 g/dL (ref 6.5–8.1)

## 2021-06-28 LAB — CBC WITH DIFFERENTIAL (CANCER CENTER ONLY)
Abs Immature Granulocytes: 0.11 10*3/uL — ABNORMAL HIGH (ref 0.00–0.07)
Basophils Absolute: 0.1 10*3/uL (ref 0.0–0.1)
Basophils Relative: 1 %
Eosinophils Absolute: 0.1 10*3/uL (ref 0.0–0.5)
Eosinophils Relative: 1 %
HCT: 42.9 % (ref 39.0–52.0)
Hemoglobin: 15.1 g/dL (ref 13.0–17.0)
Immature Granulocytes: 1 %
Lymphocytes Relative: 15 %
Lymphs Abs: 1.8 10*3/uL (ref 0.7–4.0)
MCH: 31.9 pg (ref 26.0–34.0)
MCHC: 35.2 g/dL (ref 30.0–36.0)
MCV: 90.5 fL (ref 80.0–100.0)
Monocytes Absolute: 1.2 10*3/uL — ABNORMAL HIGH (ref 0.1–1.0)
Monocytes Relative: 10 %
Neutro Abs: 8.5 10*3/uL — ABNORMAL HIGH (ref 1.7–7.7)
Neutrophils Relative %: 72 %
Platelet Count: 151 10*3/uL (ref 150–400)
RBC: 4.74 MIL/uL (ref 4.22–5.81)
RDW: 15.1 % (ref 11.5–15.5)
WBC Count: 11.8 10*3/uL — ABNORMAL HIGH (ref 4.0–10.5)
nRBC: 0 % (ref 0.0–0.2)

## 2021-06-28 MED ORDER — SODIUM CHLORIDE 0.9% FLUSH
10.0000 mL | Freq: Once | INTRAVENOUS | Status: AC
  Administered 2021-06-28: 10 mL

## 2021-06-28 MED ORDER — DEXTROSE 5 % IV SOLN: Freq: Once | INTRAVENOUS | Status: AC

## 2021-06-28 MED ORDER — DEXTROSE 5 % IV SOLN: Freq: Once | INTRAVENOUS | Status: DC

## 2021-06-28 MED ORDER — PALONOSETRON HCL INJECTION 0.25 MG/5ML
0.2500 mg | Freq: Once | INTRAVENOUS | Status: AC
  Administered 2021-06-28: 0.25 mg via INTRAVENOUS
  Filled 2021-06-28: qty 5

## 2021-06-28 MED ORDER — OXALIPLATIN CHEMO INJECTION 100 MG/20ML
85.0000 mg/m2 | Freq: Once | INTRAVENOUS | Status: AC
  Administered 2021-06-28: 155 mg via INTRAVENOUS
  Filled 2021-06-28: qty 31

## 2021-06-28 MED ORDER — SODIUM CHLORIDE 0.9 % IV SOLN
150.0000 mg/m2 | Freq: Once | INTRAVENOUS | Status: AC
  Administered 2021-06-28: 280 mg via INTRAVENOUS
  Filled 2021-06-28: qty 14

## 2021-06-28 MED ORDER — SODIUM CHLORIDE 0.9 % IV SOLN
150.0000 mg | Freq: Once | INTRAVENOUS | Status: AC
  Administered 2021-06-28: 150 mg via INTRAVENOUS
  Filled 2021-06-28: qty 150

## 2021-06-28 MED ORDER — SODIUM CHLORIDE 0.9 % IV SOLN
10.0000 mg | Freq: Once | INTRAVENOUS | Status: AC
  Administered 2021-06-28: 10 mg via INTRAVENOUS
  Filled 2021-06-28: qty 10

## 2021-06-28 MED ORDER — ATROPINE SULFATE 1 MG/ML IV SOLN
0.5000 mg | Freq: Once | INTRAVENOUS | Status: AC | PRN
  Administered 2021-06-28: 0.5 mg via INTRAVENOUS
  Filled 2021-06-28: qty 1

## 2021-06-28 MED ORDER — SODIUM CHLORIDE 0.9 % IV SOLN
2400.0000 mg/m2 | INTRAVENOUS | Status: DC
  Administered 2021-06-28: 4350 mg via INTRAVENOUS
  Filled 2021-06-28: qty 87

## 2021-06-28 MED ORDER — SODIUM CHLORIDE 0.9 % IV SOLN
400.0000 mg/m2 | Freq: Once | INTRAVENOUS | Status: AC
  Administered 2021-06-28: 724 mg via INTRAVENOUS
  Filled 2021-06-28: qty 25

## 2021-06-28 NOTE — Progress Notes (Signed)
?[place]   ?Telephone:(336) [phone] [phone]   ?Clinic Follow up Note  ? ?Patient Care Team: ?Lavone Orn, MD as PCP - General (Internal Medicine) ?Deboraha Sprang, MD as PCP - Cardiology (Cardiology) ?Truitt Merle, MD as Consulting Physician (Oncology) ?Earl Gala, Deliah Goody, RN as Sales executive (Oncology) ? ?Date of Service:  06/28/2021 ? ?CHIEF COMPLAINT: f/u of pancreatic cancer ? ?CURRENT THERAPY:  ?Neoadjuvant FOLFIRINOX q2 weeks, starting 04/19/21. Irinotecan held with C1 due to hyperbilirubinemia ? ?ASSESSMENT & PLAN:  ?Casey Pierce. is a 66 y.o. male with  ? ?1. Pancreatic adenocarcinoma, Stage IIB, p(T2 N1)M0 ?-presented with painless obstructive jaundice in 02/2021. RUQ ultrasound and subsequently MRI of the abdomen 03/07/21 showed a 3.3 cm mass in pancreatic head causing obstruction of the distal bile duct resulting in hepatic biliary duct dilation. ?-ERCP and EUS with stent placement performed on 03/28/21, showed 2.5 cm mass, no evidence of invasion. FNA of the pancreatic head lesion showed malignant cells consistent with adenocarcinoma. ?-he was seen by Dr. Barry Dienes on 04/10/21, who recommend neoadjuvant chemo followed by Whipple procedure. ?-staging chest CT 04/17/21 showed nonspecific small (4 mm or less) pulmonary nodules.  ?-port placed 04/18/21. He started neoadjuvant FOLFIRINOX on 04/19/21. Irinotecan was held due to elevated tbil  ?-his CA 19-9 has risen lately, to 265 on 05/31/21, today's result is pending. He is scheduled for CT CAP on 07/05/21 to evaluate chemo response. ?-He is tolerating chemotherapy overall well and recovers from side effects after a few days. ?-labs reviewed, overall stable. Liver enzymes slightly elevated. Will proceed with cycle 6 mFOLFIRINOX today with full dose. ?  ?2. Genetics ?-testing on 05/01/21 was negative. ?  ?3. Comorbidities ?-Patient has history of CVA in 2016 and A-fib.  Denies bleeding on Eliquis ?-The patient has some numbness in  his right palm, but is functional with his activities of daily living without any significant debility. ?-Denies new/worsening neuropathy ?  ?  ?PLAN: ?-proceed with C6 mFOLFIRINOX today with full dose ?-GCSF on day 3 ?-restaging CT CAP on 07/05/21 ?-lab, flush, f/u, and FOLFIRINOX in 2 and 4 weeks as scheduled ? ? ?No problem-specific Assessment & Plan notes found for this encounter. ? ? ?SUMMARY OF ONCOLOGIC HISTORY: ?Oncology History  ?Pancreatic cancer ([place])  ?03/03/2021 Imaging  ? US Abdomen Limited RUQ ?IMPRESSION: ?Probable fatty infiltration of liver as above. ?  ?Distended gallbladder without definite stones or wall thickening. ?  ?Borderline CBD dilatation, CBD 7 mm diameter. ?  ?03/15/2021 Imaging  ? MR ABDOMEN MRCP W WO CONTAST  ? ?IMPRESSION: ?1. Enhancing 2.6 x 2.2 x 3.3 cm mass in the head of the pancreas ?causing obstruction of the distal common bile duct resulting in ?intra and extrahepatic biliary ductal dilatation. Findings are ?highly concerning for primary pancreatic neoplasm. Further ?evaluation with endoscopic ultrasound is strongly recommended in the ?near future. ?2. No definite signs of metastatic disease identified in the ?abdomen. ? ?  ?03/28/2021 Procedure  ? DG ERCP WITH SPHINCTEROTOMY  ?Performed by Dr. Watt Climes ? ?FINDINGS: ?A total of 4 fluoroscopic spot images taken during ERCP are ?submitted for review. Initial images demonstrate scope overlying the ?upper abdomen with wire catheterization of the common bile duct. ?Contrast opacification of the common bile duct demonstrates a ?dilated appearance with abrupt shouldering compatible with ?obstruction. A metal CBD stent is then placed, and appears in ?grossly satisfactory position. ?  ?IMPRESSION: ?Fluoroscopic spot images taken during ERCP with metallic stent ?deployment as described. Refer to  dedicated procedure report for ?full details. ?  ?03/28/2021 Procedure  ? UPPER ENDOSCOPIC ULTRASOUND ? ?- There was dilation in the common bile duct  which measured up to 15 mm. ?- A few abnormal lymph nodes were visualized in the porta hepatis region. ?- A mass was identified in the pancreatic head. This was staged T3 N1 Mx by ?endosonographic criteria. Fine needle aspiration performed. ?- There was no evidence of significant pathology in the left lobe of the liver. ? ?Findings: ?There was dilation in the common bile duct which measured up to 15 mm. ?A few abnormal lymph nodes were visualized in the porta hepatis region. The nodes were hypoechoic and ?had poorly defined margins. ?An irregular mass was identified in the pancreatic head. The mass was hypoechoic. The mass measured ?25 mm by 20 mm in maximal cross-sectional diameter. The endosonographic borders were poorlydefined. ?An intact interface was seen between the mass and the superior mesenteric artery, portal vein, ?superior mesenteric vein and splenic vein suggesting a lack of invasion. The remainder of the pancreas ?was examined. The endosonographic appearance of parenchyma and the upstream pancreatic duct ?indicated duct dilation. Fine needle aspiration for cytology was performed. Color Doppler imaging was ?utilized prior to needle puncture to confirm a lack of significant vascular structures within the needle path. ?Three passes were made with the 25 gauge needle using a transduodenal approach. A stylet was used. ?A cytotechnologist was present to evaluate the adequacy of the specimen. Preliminary cytology is ?suspicious for adenocarcinoma (final results are pending). ?  ?03/28/2021 Pathology Results  ? CYTOLOGY - NON PAP  ?CASE: [id number]  ?PATIENT: Casey Pierce  ?Non-Gynecological Cytology Report  ? ?Clinical History: Obstructive jaudice  ?Specimen Submitted:  A. PANCREAS, HEAD, FINE NEEDLE ASPIRATION:  ? ? ?FINAL MICROSCOPIC DIAGNOSIS:  ?- Malignant cells consistent with adenocarcinoma  ?  ?03/28/2021 Cancer Staging  ? Staging form: Exocrine Pancreas, AJCC 8th Edition ?- Clinical stage from  03/28/2021: Stage IIB (cT2, cN1, cM0) - Signed by Truitt Merle, MD on 04/06/2021 ?Stage prefix: Initial diagnosis ? ?  ?04/05/2021 Initial Diagnosis  ? Pancreatic cancer [facility]) ?  ?04/19/2021 -  Chemotherapy  ? Patient is on Treatment Plan : PANCREAS Modified FOLFIRINOX q14d x 4 cycles  ?   ? Genetic Testing  ? Negative genetic testing. No pathogenic variants identified on the [facility] Multi-Cancer+RNA panel. The report date is 05/14/2021. ? ?The Multi-Cancer Panel + RNA offered by [facility] includes sequencing and/or deletion duplication testing of the following 84 genes: AIP, ALK, APC, ATM, AXIN2,BAP1,  BARD1, BLM, BMPR1A, BRCA1, BRCA2, BRIP1, CASR, CDC73, CDH1, CDK4, CDKN1B, CDKN1C, CDKN2A (p14ARF), CDKN2A (p16INK4a), CEBPA, CHEK2, CTNNA1, DICER1, DIS3L2, EGFR (c.2369C>T, p.Thr790Met variant only), EPCAM (Deletion/duplication testing only), FH, FLCN, GATA2, GPC3, GREM1 (Promoter region deletion/duplication testing only), HOXB13 (c.251G>A, p.Gly84Glu), HRAS, KIT, MAX, MEN1, MET, MITF (c.952G>A, p.Glu318Lys variant only), MLH1, MSH2, MSH3, MSH6, MUTYH, NBN, NF1, NF2, NTHL1, PALB2, PDGFRA, PHOX2B, PMS2, POLD1, POLE, POT1, PRKAR1A, PTCH1, PTEN, RAD50, RAD51C, RAD51D, RB1, RECQL4, RET, RUNX1, SDHAF2, SDHA (sequence changes only), SDHB, SDHC, SDHD, SMAD4, SMARCA4, SMARCB1, SMARCE1, STK11, SUFU, TERC, TERT, TMEM127, TP53, TSC1, TSC2, VHL, WRN and WT1. ?  ? ? ? ?INTERVAL HISTORY:  ?Casey Pierce. is here for a follow up of pancreatic cancer. He was last seen by me on 06/14/21. He was seen in the infusion area. ?He reports he had two days of diarrhea and poor appetite that are resolved. ?  ?All other systems were reviewed with the patient and are negative. ? ?  MEDICAL HISTORY:  ?Past Medical History:  ?Diagnosis Date  ? Atrial fibrillation ([place])   ? Coronary artery disease   ? NSTEMI 12/1998 - LHC:  prox 85-90, RCA prox 30-50, EF 65-70 >> ODG:WZDV and BMS to LAD; large Dx jailed by stent (90%), EF 60  // Myoview 7/18: normal  perfusion, Low Risk  ? History of echocardiogram   ? Echo 7/18: mild LVH, EF 60-65, no RWMA, mild MR, mod LAE  ? Myocardial infarction [facility])   ? Pancreatic cancer ([place])   ? Stroke [facility]) 04/2014  ? ? ?SURGICAL HIST

## 2021-06-28 NOTE — Patient Instructions (Signed)
[place]  Discharge Instructions: ?Thank you for choosing [facility] to provide your oncology and hematology care.  ? ?If you have a lab appointment with the [facility], please go directly to the [facility] and check in at the registration area. ?  ?Wear comfortable clothing and clothing appropriate for easy access to any Portacath or PICC line.  ? ?We strive to give you quality time with your provider. You may need to reschedule your appointment if you arrive late (15 or more minutes).  Arriving late affects you and other patients whose appointments are after yours.  Also, if you miss three or more appointments without notifying the office, you may be dismissed from the clinic at the provider?s discretion.    ?  ?For prescription refill requests, have your pharmacy contact our office and allow 72 hours for refills to be completed.   ? ?Today you received the following chemotherapy and/or immunotherapy agents: Oxaliplatin, Irinotecan, Leucovorin, Fluorouracil.     ?  ?To help prevent nausea and vomiting after your treatment, we encourage you to take your nausea medication as directed. ? ?BELOW ARE SYMPTOMS THAT SHOULD BE REPORTED IMMEDIATELY: ?*FEVER GREATER THAN 100.4 F (38 ?C) OR HIGHER ?*CHILLS OR SWEATING ?*NAUSEA AND VOMITING THAT IS NOT CONTROLLED WITH YOUR NAUSEA MEDICATION ?*UNUSUAL SHORTNESS OF BREATH ?*UNUSUAL BRUISING OR BLEEDING ?*URINARY PROBLEMS (pain or burning when urinating, or frequent urination) ?*BOWEL PROBLEMS (unusual diarrhea, constipation, pain near the anus) ?TENDERNESS IN MOUTH AND THROAT WITH OR WITHOUT PRESENCE OF ULCERS (sore throat, sores in mouth, or a toothache) ?UNUSUAL RASH, SWELLING OR PAIN  ?UNUSUAL VAGINAL DISCHARGE OR ITCHING  ? ?Items with * indicate a potential emergency and should be followed up as soon as possible or go to the Emergency Department if any problems should occur. ? ?Please show the CHEMOTHERAPY ALERT CARD  or IMMUNOTHERAPY ALERT CARD at check-in to the Emergency Department and triage nurse. ? ?Should you have questions after your visit or need to cancel or reschedule your appointment, please contact [facility]  Dept: [phone]  and follow the prompts.  Office hours are 8:00 a.m. to 4:30 p.m. Monday - Friday. Please note that voicemails left after 4:00 p.m. may not be returned until the following business day.  We are closed weekends and major holidays. You have access to a nurse at all times for urgent questions. Please call the main number to the clinic Dept: [phone] and follow the prompts. ? ? ?For any non-urgent questions, you may also contact your provider using MyChart. We now offer e-Visits for anyone 10 and older to request care online for non-urgent symptoms. For details visit [email or web address]. ?  ?Also download the MyChart app! Go to the app store, search "MyChart", open the app, select [facility], and log in with your MyChart username and password. ? ?Due to Covid, a mask is required upon entering the hospital/clinic. If you do not have a mask, one will be given to you upon arrival. For doctor visits, patients may have 1 support person aged 38 or older with them. For treatment visits, patients cannot have anyone with them due to current Covid guidelines and our immunocompromised population.  ? ?

## 2021-06-29 LAB — CANCER ANTIGEN 19-9: CA 19-9: 185 U/mL — ABNORMAL HIGH (ref 0–35)

## 2021-06-30 ENCOUNTER — Inpatient Hospital Stay: Payer: Medicare PPO

## 2021-06-30 ENCOUNTER — Other Ambulatory Visit: Payer: Self-pay

## 2021-06-30 VITALS — BP 110/79 | HR 87 | Temp 98.5°F | Resp 18

## 2021-06-30 DIAGNOSIS — C250 Malignant neoplasm of head of pancreas: Secondary | ICD-10-CM

## 2021-06-30 DIAGNOSIS — I4891 Unspecified atrial fibrillation: Secondary | ICD-10-CM | POA: Diagnosis not present

## 2021-06-30 DIAGNOSIS — Z8673 Personal history of transient ischemic attack (TIA), and cerebral infarction without residual deficits: Secondary | ICD-10-CM | POA: Diagnosis not present

## 2021-06-30 DIAGNOSIS — I252 Old myocardial infarction: Secondary | ICD-10-CM | POA: Diagnosis not present

## 2021-06-30 DIAGNOSIS — I2510 Atherosclerotic heart disease of native coronary artery without angina pectoris: Secondary | ICD-10-CM | POA: Diagnosis not present

## 2021-06-30 DIAGNOSIS — Z7901 Long term (current) use of anticoagulants: Secondary | ICD-10-CM | POA: Diagnosis not present

## 2021-06-30 DIAGNOSIS — Z5111 Encounter for antineoplastic chemotherapy: Secondary | ICD-10-CM | POA: Diagnosis not present

## 2021-06-30 MED ORDER — SODIUM CHLORIDE 0.9% FLUSH: 10.0000 mL | INTRAVENOUS | Status: DC | PRN

## 2021-06-30 MED ORDER — PEGFILGRASTIM-CBQV 6 MG/0.6ML ~~LOC~~ SOSY
6.0000 mg | PREFILLED_SYRINGE | Freq: Once | SUBCUTANEOUS | Status: AC
  Administered 2021-06-30: 6 mg via SUBCUTANEOUS
  Filled 2021-06-30: qty 0.6

## 2021-06-30 MED ORDER — HEPARIN SOD (PORK) LOCK FLUSH 100 UNIT/ML IV SOLN: 500.0000 [IU] | Freq: Once | INTRAVENOUS | Status: DC | PRN

## 2021-07-05 ENCOUNTER — Ambulatory Visit (HOSPITAL_COMMUNITY)
Admission: RE | Admit: 2021-07-05 | Discharge: 2021-07-05 | Disposition: A | Discharge status: Home / Self Care | Payer: Medicare PPO | Source: Ambulatory Visit | Attending: Hematology | Admitting: Hematology

## 2021-07-05 DIAGNOSIS — C250 Malignant neoplasm of head of pancreas: Secondary | ICD-10-CM | POA: Insufficient documentation

## 2021-07-05 DIAGNOSIS — I700 Atherosclerosis of aorta: Secondary | ICD-10-CM | POA: Diagnosis not present

## 2021-07-05 DIAGNOSIS — R918 Other nonspecific abnormal finding of lung field: Secondary | ICD-10-CM | POA: Diagnosis not present

## 2021-07-05 DIAGNOSIS — K6389 Other specified diseases of intestine: Secondary | ICD-10-CM | POA: Diagnosis not present

## 2021-07-05 MED ORDER — SODIUM CHLORIDE (PF) 0.9 % IJ SOLN
INTRAMUSCULAR | Status: AC
  Filled 2021-07-05: qty 50

## 2021-07-05 MED ORDER — IOHEXOL 300 MG/ML  SOLN
100.0000 mL | Freq: Once | INTRAMUSCULAR | Status: AC | PRN
Start: 2021-07-05 — End: 2021-07-05
  Administered 2021-07-05: 100 mL via INTRAVENOUS

## 2021-07-12 ENCOUNTER — Inpatient Hospital Stay: Payer: Medicare PPO

## 2021-07-12 ENCOUNTER — Inpatient Hospital Stay: Payer: Medicare PPO | Admitting: Hematology

## 2021-07-12 ENCOUNTER — Encounter: Payer: Self-pay | Admitting: Hematology

## 2021-07-12 ENCOUNTER — Other Ambulatory Visit: Payer: Self-pay

## 2021-07-12 ENCOUNTER — Ambulatory Visit: Payer: Medicare PPO

## 2021-07-12 VITALS — BP 111/78 | HR 85 | Temp 98.1°F | Resp 17 | Wt 154.0 lb

## 2021-07-12 DIAGNOSIS — I4891 Unspecified atrial fibrillation: Secondary | ICD-10-CM | POA: Diagnosis not present

## 2021-07-12 DIAGNOSIS — I252 Old myocardial infarction: Secondary | ICD-10-CM | POA: Diagnosis not present

## 2021-07-12 DIAGNOSIS — I2510 Atherosclerotic heart disease of native coronary artery without angina pectoris: Secondary | ICD-10-CM | POA: Diagnosis not present

## 2021-07-12 DIAGNOSIS — C250 Malignant neoplasm of head of pancreas: Secondary | ICD-10-CM | POA: Diagnosis not present

## 2021-07-12 DIAGNOSIS — Z8673 Personal history of transient ischemic attack (TIA), and cerebral infarction without residual deficits: Secondary | ICD-10-CM | POA: Diagnosis not present

## 2021-07-12 DIAGNOSIS — Z7901 Long term (current) use of anticoagulants: Secondary | ICD-10-CM | POA: Diagnosis not present

## 2021-07-12 DIAGNOSIS — Z5111 Encounter for antineoplastic chemotherapy: Secondary | ICD-10-CM | POA: Diagnosis not present

## 2021-07-12 LAB — CBC WITH DIFFERENTIAL (CANCER CENTER ONLY)
Abs Immature Granulocytes: 0.19 10*3/uL — ABNORMAL HIGH (ref 0.00–0.07)
Basophils Absolute: 0.1 10*3/uL (ref 0.0–0.1)
Basophils Relative: 1 %
Eosinophils Absolute: 0.1 10*3/uL (ref 0.0–0.5)
Eosinophils Relative: 1 %
HCT: 40.7 % (ref 39.0–52.0)
Hemoglobin: 14.1 g/dL (ref 13.0–17.0)
Immature Granulocytes: 2 %
Lymphocytes Relative: 16 %
Lymphs Abs: 1.9 10*3/uL (ref 0.7–4.0)
MCH: 32 pg (ref 26.0–34.0)
MCHC: 34.6 g/dL (ref 30.0–36.0)
MCV: 92.3 fL (ref 80.0–100.0)
Monocytes Absolute: 1.4 10*3/uL — ABNORMAL HIGH (ref 0.1–1.0)
Monocytes Relative: 12 %
Neutro Abs: 8.3 10*3/uL — ABNORMAL HIGH (ref 1.7–7.7)
Neutrophils Relative %: 68 %
Platelet Count: 108 10*3/uL — ABNORMAL LOW (ref 150–400)
RBC: 4.41 MIL/uL (ref 4.22–5.81)
RDW: 15.1 % (ref 11.5–15.5)
WBC Count: 11.9 10*3/uL — ABNORMAL HIGH (ref 4.0–10.5)
nRBC: 0 % (ref 0.0–0.2)

## 2021-07-12 LAB — CMP (CANCER CENTER ONLY)
ALT: 59 U/L — ABNORMAL HIGH (ref 0–44)
AST: 40 U/L (ref 15–41)
Albumin: 3.4 g/dL — ABNORMAL LOW (ref 3.5–5.0)
Alkaline Phosphatase: 106 U/L (ref 38–126)
Anion gap: 6 (ref 5–15)
BUN: 6 mg/dL — ABNORMAL LOW (ref 8–23)
CO2: 31 mmol/L (ref 22–32)
Calcium: 8.9 mg/dL (ref 8.9–10.3)
Chloride: 102 mmol/L (ref 98–111)
Creatinine: 0.66 mg/dL (ref 0.61–1.24)
GFR, Estimated: >60 mL/min (ref >60)
Glucose, Bld: 115 mg/dL — ABNORMAL HIGH (ref 70–99)
Potassium: 3.7 mmol/L (ref 3.5–5.1)
Sodium: 139 mmol/L (ref 135–145)
Total Bilirubin: 0.4 mg/dL (ref 0.3–1.2)
Total Protein: 6.1 g/dL — ABNORMAL LOW (ref 6.5–8.1)

## 2021-07-12 MED ORDER — SODIUM CHLORIDE 0.9 % IV SOLN
400.0000 mg/m2 | Freq: Once | INTRAVENOUS | Status: AC
  Administered 2021-07-12: 724 mg via INTRAVENOUS
  Filled 2021-07-12: qty 25

## 2021-07-12 MED ORDER — OXALIPLATIN CHEMO INJECTION 100 MG/20ML
85.0000 mg/m2 | Freq: Once | INTRAVENOUS | Status: AC
  Administered 2021-07-12: 155 mg via INTRAVENOUS
  Filled 2021-07-12: qty 31

## 2021-07-12 MED ORDER — ATROPINE SULFATE 1 MG/ML IV SOLN
0.5000 mg | Freq: Once | INTRAVENOUS | Status: AC | PRN
  Administered 2021-07-12: 0.5 mg via INTRAVENOUS
  Filled 2021-07-12: qty 1

## 2021-07-12 MED ORDER — DEXTROSE 5 % IV SOLN: Freq: Once | INTRAVENOUS | Status: AC

## 2021-07-12 MED ORDER — SODIUM CHLORIDE 0.9 % IV SOLN
10.0000 mg | Freq: Once | INTRAVENOUS | Status: AC
  Administered 2021-07-12: 10 mg via INTRAVENOUS
  Filled 2021-07-12: qty 10

## 2021-07-12 MED ORDER — SODIUM CHLORIDE 0.9 % IV SOLN
150.0000 mg/m2 | Freq: Once | INTRAVENOUS | Status: AC
  Administered 2021-07-12: 280 mg via INTRAVENOUS
  Filled 2021-07-12: qty 14

## 2021-07-12 MED ORDER — SODIUM CHLORIDE 0.9 % IV SOLN
2400.0000 mg/m2 | INTRAVENOUS | Status: DC
  Administered 2021-07-12: 4350 mg via INTRAVENOUS
  Filled 2021-07-12: qty 87

## 2021-07-12 MED ORDER — SODIUM CHLORIDE 0.9% FLUSH
10.0000 mL | Freq: Once | INTRAVENOUS | Status: AC
  Administered 2021-07-12: 10 mL

## 2021-07-12 MED ORDER — PROCHLORPERAZINE MALEATE 10 MG PO TABS: 10.0000 mg | ORAL_TABLET | Freq: Four times a day (QID) | ORAL | 1 refills | Status: DC | PRN

## 2021-07-12 MED ORDER — ONDANSETRON HCL 8 MG PO TABS: 8.0000 mg | ORAL_TABLET | Freq: Two times a day (BID) | ORAL | 1 refills | Status: DC | PRN

## 2021-07-12 MED ORDER — PALONOSETRON HCL INJECTION 0.25 MG/5ML
0.2500 mg | Freq: Once | INTRAVENOUS | Status: AC
  Administered 2021-07-12: 0.25 mg via INTRAVENOUS
  Filled 2021-07-12: qty 5

## 2021-07-12 MED ORDER — SODIUM CHLORIDE 0.9 % IV SOLN
150.0000 mg | Freq: Once | INTRAVENOUS | Status: AC
  Administered 2021-07-12: 150 mg via INTRAVENOUS
  Filled 2021-07-12: qty 150

## 2021-07-12 NOTE — Progress Notes (Signed)
?[place]   ?Telephone:([phone] [phone]   ?Clinic Follow up Note  ? ?Patient Care Team: ?Lavone Orn, MD as PCP - General (Internal Medicine) ?Deboraha Sprang, MD as PCP - Cardiology (Cardiology) ?Truitt Merle, MD as Consulting Physician (Oncology) ?Earl Gala, Deliah Goody, RN as Sales executive (Oncology) ? ?Date of Service:  07/12/2021 ? ?CHIEF COMPLAINT: f/u of pancreatic cancer ? ?CURRENT THERAPY:  ?Neoadjuvant FOLFIRINOX q2 weeks, starting 04/19/21. Irinotecan held with C1 due to hyperbilirubinemia ? ?ASSESSMENT & PLAN:  ?Casey Pierce. is a 66 y.o. male with  ? ?1. Pancreatic adenocarcinoma, Stage IIB, p(T2 N1)M0 ?-presented with painless obstructive jaundice in 02/2021. RUQ ultrasound and subsequently MRI of the abdomen 03/07/21 showed a 3.3 cm mass in pancreatic head causing obstruction of the distal bile duct resulting in hepatic biliary duct dilation. ?-ERCP and EUS with stent placement performed on 03/28/21, showed 2.5 cm mass, no evidence of invasion. FNA of the pancreatic head lesion showed malignant cells consistent with adenocarcinoma. ?-he was seen by Dr. Barry Dienes on 04/10/21, who recommend neoadjuvant chemo followed by Whipple procedure. ?-staging chest CT 04/17/21 showed nonspecific small (4 mm or less) pulmonary nodules.  ?-port placed 04/18/21. He started neoadjuvant FOLFIRINOX on 04/19/21. Irinotecan was held due to elevated tbili ?-his CA 19-9 has decreased lately  ?-restaging CT CAP on 07/05/21 showed overall positive response to therapy, with no discernible residual pancreatic head mass and no definite signs of metastatic disease. I reviewed the results with them today. His based line scan was MRI, not CT. Will review his case in our GI tumor board next week, to see if he needs a repeated MRI before surgery ?-He is tolerating chemotherapy overall well and recovers from side effects after a few days. ?-labs reviewed, overall stable. Will proceed with cycle 7  mFOLFIRINOX today with full dose, I plan to give 1 more cycle chemo before his surgery. ?-We discussed role of adjuvant chemotherapy for 2 more months, depends on his response to neoadjuvant chemo and surgical pathology findings. ?  ?2. Genetics ?-testing on 05/01/21 was negative. ?  ?3. Comorbidities ?-Patient has history of CVA in 2016 and A-fib.  Denies bleeding on Eliquis ?-The patient has some numbness in his right palm, but is functional with his activities of daily living without any significant debility. ?-Denies new/worsening neuropathy ?  ?  ?PLAN: ?-proceed with C7 mFOLFIRINOX today with full dose ?-GCSF on day 3 ?-I refilled zofran and compazine ?-lab, flush, f/u, and FOLFIRINOX in 2 and 4 weeks as scheduled ? ? ?No problem-specific Assessment & Plan notes found for this encounter. ? ? ?SUMMARY OF ONCOLOGIC HISTORY: ?Oncology History  ?Pancreatic cancer ([place])  ?03/03/2021 Imaging  ? US Abdomen Limited RUQ ?IMPRESSION: ?Probable fatty infiltration of liver as above. ?  ?Distended gallbladder without definite stones or wall thickening. ?  ?Borderline CBD dilatation, CBD 7 mm diameter. ?  ?03/15/2021 Imaging  ? MR ABDOMEN MRCP W WO CONTAST  ? ?IMPRESSION: ?1. Enhancing 2.6 x 2.2 x 3.3 cm mass in the head of the pancreas ?causing obstruction of the distal common bile duct resulting in ?intra and extrahepatic biliary ductal dilatation. Findings are ?highly concerning for primary pancreatic neoplasm. Further ?evaluation with endoscopic ultrasound is strongly recommended in the ?near future. ?2. No definite signs of metastatic disease identified in the ?abdomen. ? ?  ?03/28/2021 Procedure  ? DG ERCP WITH SPHINCTEROTOMY  ?Performed by Dr. Watt Climes ? ?FINDINGS: ?A total of 4 fluoroscopic spot images taken  during ERCP are ?submitted for review. Initial images demonstrate scope overlying the ?upper abdomen with wire catheterization of the common bile duct. ?Contrast opacification of the common bile duct demonstrates  a ?dilated appearance with abrupt shouldering compatible with ?obstruction. A metal CBD stent is then placed, and appears in ?grossly satisfactory position. ?  ?IMPRESSION: ?Fluoroscopic spot images taken during ERCP with metallic stent ?deployment as described. Refer to dedicated procedure report for ?full details. ?  ?03/28/2021 Procedure  ? UPPER ENDOSCOPIC ULTRASOUND ? ?- There was dilation in the common bile duct which measured up to 15 mm. ?- A few abnormal lymph nodes were visualized in the porta hepatis region. ?- A mass was identified in the pancreatic head. This was staged T3 N1 Mx by ?endosonographic criteria. Fine needle aspiration performed. ?- There was no evidence of significant pathology in the left lobe of the liver. ? ?Findings: ?There was dilation in the common bile duct which measured up to 15 mm. ?A few abnormal lymph nodes were visualized in the porta hepatis region. The nodes were hypoechoic and ?had poorly defined margins. ?An irregular mass was identified in the pancreatic head. The mass was hypoechoic. The mass measured ?25 mm by 20 mm in maximal cross-sectional diameter. The endosonographic borders were poorlydefined. ?An intact interface was seen between the mass and the superior mesenteric artery, portal vein, ?superior mesenteric vein and splenic vein suggesting a lack of invasion. The remainder of the pancreas ?was examined. The endosonographic appearance of parenchyma and the upstream pancreatic duct ?indicated duct dilation. Fine needle aspiration for cytology was performed. Color Doppler imaging was ?utilized prior to needle puncture to confirm a lack of significant vascular structures within the needle path. ?Three passes were made with the 25 gauge needle using a transduodenal approach. A stylet was used. ?A cytotechnologist was present to evaluate the adequacy of the specimen. Preliminary cytology is ?suspicious for adenocarcinoma (final results are pending). ?  ?03/28/2021  Pathology Results  ? CYTOLOGY - NON PAP  ?CASE: [id number]  ?PATIENT: Casey Pierce  ?Non-Gynecological Cytology Report  ? ?Clinical History: Obstructive jaudice  ?Specimen Submitted:  A. PANCREAS, HEAD, FINE NEEDLE ASPIRATION:  ? ? ?FINAL MICROSCOPIC DIAGNOSIS:  ?- Malignant cells consistent with adenocarcinoma  ?  ?03/28/2021 Cancer Staging  ? Staging form: Exocrine Pancreas, AJCC 8th Edition ?- Clinical stage from 03/28/2021: Stage IIB (cT2, cN1, cM0) - Signed by Truitt Merle, MD on 04/06/2021 ?Stage prefix: Initial diagnosis ? ?  ?04/05/2021 Initial Diagnosis  ? Pancreatic cancer [facility]) ?  ?04/19/2021 -  Chemotherapy  ? Patient is on Treatment Plan : PANCREAS Modified FOLFIRINOX q14d x 4 cycles  ? ?  ?  ?05/01/2021 Genetic Testing  ? Negative genetic testing. No pathogenic variants identified on the [facility] Multi-Cancer+RNA panel. The report date is 05/14/2021. ? ?The Multi-Cancer Panel + RNA offered by [facility] includes sequencing and/or deletion duplication testing of the following 84 genes: AIP, ALK, APC, ATM, AXIN2,BAP1,  BARD1, BLM, BMPR1A, BRCA1, BRCA2, BRIP1, CASR, CDC73, CDH1, CDK4, CDKN1B, CDKN1C, CDKN2A (p14ARF), CDKN2A (p16INK4a), CEBPA, CHEK2, CTNNA1, DICER1, DIS3L2, EGFR (c.2369C>T, p.Thr790Met variant only), EPCAM (Deletion/duplication testing only), FH, FLCN, GATA2, GPC3, GREM1 (Promoter region deletion/duplication testing only), HOXB13 (c.251G>A, p.Gly84Glu), HRAS, KIT, MAX, MEN1, MET, MITF (c.952G>A, p.Glu318Lys variant only), MLH1, MSH2, MSH3, MSH6, MUTYH, NBN, NF1, NF2, NTHL1, PALB2, PDGFRA, PHOX2B, PMS2, POLD1, POLE, POT1, PRKAR1A, PTCH1, PTEN, RAD50, RAD51C, RAD51D, RB1, RECQL4, RET, RUNX1, SDHAF2, SDHA (sequence changes only), SDHB, SDHC, SDHD, SMAD4, SMARCA4, SMARCB1, SMARCE1, STK11, SUFU, TERC, TERT,  TMEM127, TP53, TSC1, TSC2, VHL, WRN and WT1. ?  ?07/05/2021 Imaging  ? EXAM: ?CT CHEST WITHOUT, CT ABDOMEN AND PELVIS WITH AND WITHOUT CONTRAST ? ?IMPRESSION: ?1. Today's study demonstrates positive  response to therapy with no discernible residual pancreatic head mass confidently identified on today's examination. Common bile duct stent appears appropriately located, and there is no residual intrahepatic biliary ductal

## 2021-07-12 NOTE — Patient Instructions (Signed)
Casey Pierce   ?Discharge Instructions: ?Thank you for choosing [facility] to provide your oncology and hematology care.  ? ?If you have a lab appointment with the [facility], please go directly to the [facility] and check in at the registration area. ?  ?Wear comfortable clothing and clothing appropriate for easy access to any Portacath or PICC line.  ? ?We strive to give you quality time with your provider. You may need to reschedule your appointment if you arrive late (15 or more minutes).  Arriving late affects you and other patients whose appointments are after yours.  Also, if you miss three or more appointments without notifying the office, you may be dismissed from the clinic at the provider?s discretion.    ?  ?For prescription refill requests, have your pharmacy contact our office and allow 72 hours for refills to be completed.   ? ?Today you received the following chemotherapy and/or immunotherapy agents: Oxaliplatin, Leucovorin, Irinotecan (Camptosar), and Fluorouracil (Adrucil)    ?  ?To help prevent nausea and vomiting after your treatment, we encourage you to take your nausea medication as directed. ? ?BELOW ARE SYMPTOMS THAT SHOULD BE REPORTED IMMEDIATELY: ?*FEVER GREATER THAN 100.4 F (38 ?C) OR HIGHER ?*CHILLS OR SWEATING ?*NAUSEA AND VOMITING THAT IS NOT CONTROLLED WITH YOUR NAUSEA MEDICATION ?*UNUSUAL SHORTNESS OF BREATH ?*UNUSUAL BRUISING OR BLEEDING ?*URINARY PROBLEMS (pain or burning when urinating, or frequent urination) ?*BOWEL PROBLEMS (unusual diarrhea, constipation, pain near the anus) ?TENDERNESS IN MOUTH AND THROAT WITH OR WITHOUT PRESENCE OF ULCERS (sore throat, sores in mouth, or a toothache) ?UNUSUAL RASH, SWELLING OR PAIN  ?UNUSUAL VAGINAL DISCHARGE OR ITCHING  ? ?Items with * indicate a potential emergency and should be followed up as soon as possible or go to the Emergency Department if any problems should occur. ? ?Please show the  CHEMOTHERAPY ALERT CARD or IMMUNOTHERAPY ALERT CARD at check-in to the Emergency Department and triage nurse. ? ?Should you have questions after your visit or need to cancel or reschedule your appointment, please contact [facility]  Dept: [phone]  and follow the prompts.  Office hours are 8:00 a.m. to 4:30 p.m. Monday - Friday. Please note that voicemails left after 4:00 p.m. may not be returned until the following business day.  We are closed weekends and major holidays. You have access to a nurse at all times for urgent questions. Please call the main number to the clinic Dept: [phone] and follow the prompts. ? ? ?For any non-urgent questions, you may also contact your provider using MyChart. We now offer e-Visits for anyone 19 and older to request care online for non-urgent symptoms. For details visit [email or web address]. ?  ?Also download the MyChart app! Go to the app store, search "MyChart", open the app, select [facility], and log in with your MyChart username and password. ? ?Due to Covid, a mask is required upon entering the hospital/clinic. If you do not have a mask, one will be given to you upon arrival. For doctor visits, patients may have 1 support person aged 64 or older with them. For treatment visits, patients cannot have anyone with them due to current Covid guidelines and our immunocompromised population.  ? ?The chemotherapy medication bag should finish at 46 hours, 96 hours, or 7 days. For example, if your pump is scheduled for 46 hours and it was put on at 4:00 p.m., it should finish at 2:00 p.m. the day it is  scheduled to come off regardless of your appointment time.   ?  ?Estimated time to finish at . ?  ?If the display on your pump reads "Low Volume" and it is beeping, take the batteries out of the pump and come to the cancer center for it to be taken off.  ? ?If the pump alarms go off prior to the pump reading "Low Volume" then call  [phone] and someone can assist you. ? ?If the plunger comes out and the chemotherapy medication is leaking out, please use your home chemo spill kit to clean up the spill. Do NOT use paper towels or other household products. ? ?If you have problems or questions regarding your pump, please call either [phone] (24 hours a day) or the cancer center Monday-Friday 8:00 a.m.- 4:30 p.m. at the clinic number and we will assist you. If you are unable to get assistance, then go to the nearest Emergency Department and ask the staff to contact the IV team for assistance.   ? ?

## 2021-07-14 ENCOUNTER — Other Ambulatory Visit: Payer: Self-pay

## 2021-07-14 ENCOUNTER — Inpatient Hospital Stay: Payer: Medicare PPO

## 2021-07-14 VITALS — BP 102/71 | HR 78 | Temp 98.5°F | Resp 18

## 2021-07-14 DIAGNOSIS — C250 Malignant neoplasm of head of pancreas: Secondary | ICD-10-CM | POA: Diagnosis not present

## 2021-07-14 DIAGNOSIS — I2510 Atherosclerotic heart disease of native coronary artery without angina pectoris: Secondary | ICD-10-CM | POA: Diagnosis not present

## 2021-07-14 DIAGNOSIS — Z7901 Long term (current) use of anticoagulants: Secondary | ICD-10-CM | POA: Diagnosis not present

## 2021-07-14 DIAGNOSIS — Z5111 Encounter for antineoplastic chemotherapy: Secondary | ICD-10-CM | POA: Diagnosis not present

## 2021-07-14 DIAGNOSIS — I4891 Unspecified atrial fibrillation: Secondary | ICD-10-CM | POA: Diagnosis not present

## 2021-07-14 DIAGNOSIS — I252 Old myocardial infarction: Secondary | ICD-10-CM | POA: Diagnosis not present

## 2021-07-14 DIAGNOSIS — Z8673 Personal history of transient ischemic attack (TIA), and cerebral infarction without residual deficits: Secondary | ICD-10-CM | POA: Diagnosis not present

## 2021-07-14 MED ORDER — HEPARIN SOD (PORK) LOCK FLUSH 100 UNIT/ML IV SOLN
500.0000 [IU] | Freq: Once | INTRAVENOUS | Status: AC | PRN
  Administered 2021-07-14: 500 [IU]

## 2021-07-14 MED ORDER — PEGFILGRASTIM-CBQV 6 MG/0.6ML ~~LOC~~ SOSY
6.0000 mg | PREFILLED_SYRINGE | Freq: Once | SUBCUTANEOUS | Status: AC
  Administered 2021-07-14: 6 mg via SUBCUTANEOUS
  Filled 2021-07-14: qty 0.6

## 2021-07-14 MED ORDER — SODIUM CHLORIDE 0.9% FLUSH
10.0000 mL | INTRAVENOUS | Status: DC | PRN
  Administered 2021-07-14: 10 mL

## 2021-07-19 ENCOUNTER — Other Ambulatory Visit: Payer: Self-pay

## 2021-07-19 NOTE — Progress Notes (Signed)
The proposed treatment discussed in conference is for discussion purpose only and is not a binding recommendation.  The patients have not been physically examined, or presented with their treatment options.  Therefore, final treatment plans cannot be decided.  

## 2021-07-25 ENCOUNTER — Other Ambulatory Visit: Payer: Self-pay | Admitting: General Surgery

## 2021-07-25 ENCOUNTER — Telehealth: Payer: Self-pay

## 2021-07-25 DIAGNOSIS — C250 Malignant neoplasm of head of pancreas: Secondary | ICD-10-CM | POA: Diagnosis not present

## 2021-07-25 DIAGNOSIS — I2510 Atherosclerotic heart disease of native coronary artery without angina pectoris: Secondary | ICD-10-CM | POA: Diagnosis not present

## 2021-07-25 DIAGNOSIS — I4819 Other persistent atrial fibrillation: Secondary | ICD-10-CM | POA: Diagnosis not present

## 2021-07-25 MED FILL — Dexamethasone Sodium Phosphate Inj 100 MG/10ML: INTRAMUSCULAR | Qty: 1 | Status: AC

## 2021-07-25 MED FILL — Fosaprepitant Dimeglumine For IV Infusion 150 MG (Base Eq): INTRAVENOUS | Qty: 5 | Status: AC

## 2021-07-25 NOTE — Telephone Encounter (Signed)
? ?  Name: Casey Pierce.  ?DOB: 1956-02-09  ?MRN: [id number] ? ?Primary Cardiologist: Virl Axe, MD ? ?Chart reviewed as part of pre-operative protocol coverage. Because of Casey BARANEK Jr.'s past medical history and time since last visit, he will require a follow-up in-office visit in order to better assess preoperative cardiovascular risk. ? ?Pre-op covering staff: ?- Please schedule appointment and call patient to inform them. If patient already had an upcoming appointment within acceptable timeframe, please add "pre-op clearance" to the appointment notes so provider is aware. ?- Please contact requesting surgeon's office via preferred method (i.e, phone, fax) to inform them of need for appointment prior to surgery. ? ?Patient is planning to undergo high risk pancreatic Whipple procedure.  Given the high risk nature of the surgery, he will need to be seen by MD.  He typically sees Casey Pierce however he does have a history of CAD along with permanent atrial fibrillation.  Scheduler to see if Casey Pierce has any availability, if no available slot is available near term, please set the patient up to see Casey Pierce for cardiac clearance prior to high risk surgery. ? ?Almyra Deforest, [place]  ?07/25/2021, 7:47 PM  ? ?

## 2021-07-25 NOTE — Telephone Encounter (Signed)
Clinical pharmacist to review Eliquis 

## 2021-07-25 NOTE — Telephone Encounter (Signed)
? ?  Pre-operative Risk Assessment  ?  ?Patient Name: Casey Pierce.  ?DOB: [birth year] ?MRN: [id number]  ? ?  ? ?Request for Surgical Clearance   ? ?Procedure:   PANCREATIC WHIPPLE ? ?Date of Surgery:  Clearance TBD                              ?   ?Surgeon:  DR. FAERA BYERLY ?Surgeon's Group or Practice Name:  [facility] ?Phone number:  [phone] ?Fax number:  [place], [place] ?  ?Type of Clearance Requested:   ?- Pharmacy:  Hold Apixaban (Eliquis) NEEDS INSTRUCTION ON WHEN PATIENT SHOULD HOLD MEDICATION  ?  ?Type of Anesthesia:  General  ?  ?Additional requests/questions:   ? ?Signed, ?Jacinta Shoe   ?07/25/2021, 4:36 PM  ? ?

## 2021-07-26 ENCOUNTER — Inpatient Hospital Stay: Payer: Medicare PPO | Admitting: Hematology

## 2021-07-26 ENCOUNTER — Inpatient Hospital Stay: Payer: Medicare PPO

## 2021-07-26 ENCOUNTER — Encounter: Payer: Self-pay | Admitting: Hematology

## 2021-07-26 ENCOUNTER — Inpatient Hospital Stay: Payer: Medicare PPO | Admitting: Dietician

## 2021-07-26 ENCOUNTER — Other Ambulatory Visit: Payer: Self-pay

## 2021-07-26 ENCOUNTER — Inpatient Hospital Stay: Payer: Medicare PPO | Attending: Hematology

## 2021-07-26 VITALS — BP 118/91 | HR 104 | Temp 98.3°F | Resp 18 | Ht 65.0 in | Wt 151.5 lb

## 2021-07-26 DIAGNOSIS — C250 Malignant neoplasm of head of pancreas: Secondary | ICD-10-CM | POA: Diagnosis not present

## 2021-07-26 DIAGNOSIS — Z8673 Personal history of transient ischemic attack (TIA), and cerebral infarction without residual deficits: Secondary | ICD-10-CM | POA: Diagnosis not present

## 2021-07-26 DIAGNOSIS — I252 Old myocardial infarction: Secondary | ICD-10-CM | POA: Insufficient documentation

## 2021-07-26 DIAGNOSIS — I4891 Unspecified atrial fibrillation: Secondary | ICD-10-CM | POA: Diagnosis not present

## 2021-07-26 DIAGNOSIS — I2510 Atherosclerotic heart disease of native coronary artery without angina pectoris: Secondary | ICD-10-CM | POA: Diagnosis not present

## 2021-07-26 LAB — CBC WITH DIFFERENTIAL (CANCER CENTER ONLY)
Abs Immature Granulocytes: 0.06 10*3/uL (ref 0.00–0.07)
Basophils Absolute: 0 10*3/uL (ref 0.0–0.1)
Basophils Relative: 0 %
Eosinophils Absolute: 0.1 10*3/uL (ref 0.0–0.5)
Eosinophils Relative: 1 %
HCT: 37.7 % — ABNORMAL LOW (ref 39.0–52.0)
Hemoglobin: 12.9 g/dL — ABNORMAL LOW (ref 13.0–17.0)
Immature Granulocytes: 1 %
Lymphocytes Relative: 19 %
Lymphs Abs: 1.3 10*3/uL (ref 0.7–4.0)
MCH: 32.6 pg (ref 26.0–34.0)
MCHC: 34.2 g/dL (ref 30.0–36.0)
MCV: 95.2 fL (ref 80.0–100.0)
Monocytes Absolute: 1 10*3/uL (ref 0.1–1.0)
Monocytes Relative: 13 %
Neutro Abs: 4.7 10*3/uL (ref 1.7–7.7)
Neutrophils Relative %: 66 %
Platelet Count: 79 10*3/uL — ABNORMAL LOW (ref 150–400)
RBC: 3.96 MIL/uL — ABNORMAL LOW (ref 4.22–5.81)
RDW: 15.9 % — ABNORMAL HIGH (ref 11.5–15.5)
WBC Count: 7.1 10*3/uL (ref 4.0–10.5)
nRBC: 0 % (ref 0.0–0.2)

## 2021-07-26 LAB — CMP (CANCER CENTER ONLY)
ALT: 55 U/L — ABNORMAL HIGH (ref 0–44)
AST: 40 U/L (ref 15–41)
Albumin: 3.4 g/dL — ABNORMAL LOW (ref 3.5–5.0)
Alkaline Phosphatase: 92 U/L (ref 38–126)
Anion gap: 6 (ref 5–15)
BUN: 7 mg/dL — ABNORMAL LOW (ref 8–23)
CO2: 29 mmol/L (ref 22–32)
Calcium: 9.1 mg/dL (ref 8.9–10.3)
Chloride: 103 mmol/L (ref 98–111)
Creatinine: 0.66 mg/dL (ref 0.61–1.24)
GFR, Estimated: >60 mL/min (ref >60)
Glucose, Bld: 136 mg/dL — ABNORMAL HIGH (ref 70–99)
Potassium: 3.7 mmol/L (ref 3.5–5.1)
Sodium: 138 mmol/L (ref 135–145)
Total Bilirubin: 0.6 mg/dL (ref 0.3–1.2)
Total Protein: 6.4 g/dL — ABNORMAL LOW (ref 6.5–8.1)

## 2021-07-26 MED ORDER — SODIUM CHLORIDE 0.9% FLUSH
10.0000 mL | Freq: Once | INTRAVENOUS | Status: AC
  Administered 2021-07-26: 10 mL

## 2021-07-26 NOTE — Progress Notes (Signed)
?[facility]   ?Telephone:(336) [phone] [phone]   ?Clinic Follow up Note  ? ?Patient Care Team: ?Lavone Orn, MD as PCP - General (Internal Medicine) ?Deboraha Sprang, MD as PCP - Cardiology (Cardiology) ?Truitt Merle, MD as Consulting Physician (Oncology) ?Earl Gala, Deliah Goody, RN as Sales executive (Oncology) ? ?Date of Service:  07/26/2021 ? ?CHIEF COMPLAINT: f/u of pancreatic cancer ? ?CURRENT THERAPY:  ?Neoadjuvant FOLFIRINOX q2 weeks, starting 04/19/21. Irinotecan held with C1 due to hyperbilirubinemia ? ?ASSESSMENT & PLAN:  ?Casey Pierce. is a 66 y.o. male with  ? ?1. Pancreatic adenocarcinoma, Stage IIB, p(T2 N1)M0 ?-presented with painless obstructive jaundice in 02/2021. RUQ ultrasound and subsequently MRI of the abdomen 03/07/21 showed a 3.3 cm mass in pancreatic head causing obstruction of the distal bile duct resulting in hepatic biliary duct dilation. ?-ERCP and EUS with stent placement performed on 03/28/21, showed 2.5 cm mass, no evidence of invasion. FNA of the pancreatic head lesion showed malignant cells consistent with adenocarcinoma. ?-he was seen by Dr. Barry Dienes on 04/10/21, who recommend neoadjuvant chemo followed by Whipple procedure. ?-staging chest CT 04/17/21 showed nonspecific small (4 mm or less) pulmonary nodules.  ?-port placed 04/18/21. He started neoadjuvant FOLFIRINOX on 04/19/21. Irinotecan was held due to elevated tbili ?-his CA 19-9 has decreased lately  ?-restaging CT CAP on 07/05/21 showed overall positive response to therapy, with no discernible residual pancreatic head mass and no definite signs of metastatic disease.  ?-he met back with Dr. Barry Dienes yesterday, 07/25/21, and is being scheduled for surgery. ?-He is tolerating chemotherapy overall well overall, but he did not recover well from last cycle until 2 days ago  ?-labs reviewed, his platelet count is down to 79k. I recommend holding his chemo for one week to allow his counts to recover. Plan to  proceed with last cycle chemo next week. I discussed that since he is not yet scheduled for surgery, we can delay his surgery to allow for additional recovery time if needed. ?  ?2. Genetics ?-testing on 05/01/21 was negative. ?  ?3. Comorbidities ?-Patient has history of CVA in 2016 and A-fib.  Denies bleeding on Eliquis ?-The patient has some numbness in his right palm, but is functional with his activities of daily living without any significant debility. ?-Denies new/worsening neuropathy ?  ?  ?PLAN: ?-we will hold chemo until next week  ?-lab, flush, and C8 mFOLFIRINOX any day next week (last cycle) ? -will reschedule dietician f/u to same day ?-GCSF on day 3 ?-lab, flush, and f/u in 4-5 weeks ? ? ?No problem-specific Assessment & Plan notes found for this encounter. ? ? ?SUMMARY OF ONCOLOGIC HISTORY: ?Oncology History  ?Pancreatic cancer ([facility])  ?03/03/2021 Imaging  ? US Abdomen Limited RUQ ?IMPRESSION: ?Probable fatty infiltration of liver as above. ?  ?Distended gallbladder without definite stones or wall thickening. ?  ?Borderline CBD dilatation, CBD 7 mm diameter. ?  ?03/15/2021 Imaging  ? MR ABDOMEN MRCP W WO CONTAST  ? ?IMPRESSION: ?1. Enhancing 2.6 x 2.2 x 3.3 cm mass in the head of the pancreas ?causing obstruction of the distal common bile duct resulting in ?intra and extrahepatic biliary ductal dilatation. Findings are ?highly concerning for primary pancreatic neoplasm. Further ?evaluation with endoscopic ultrasound is strongly recommended in the ?near future. ?2. No definite signs of metastatic disease identified in the ?abdomen. ? ?  ?03/28/2021 Procedure  ? DG ERCP WITH SPHINCTEROTOMY  ?Performed by Dr. Watt Climes ? ?FINDINGS: ?A total of 4  fluoroscopic spot images taken during ERCP are ?submitted for review. Initial images demonstrate scope overlying the ?upper abdomen with wire catheterization of the common bile duct. ?Contrast opacification of the common bile duct demonstrates a ?dilated appearance  with abrupt shouldering compatible with ?obstruction. A metal CBD stent is then placed, and appears in ?grossly satisfactory position. ?  ?IMPRESSION: ?Fluoroscopic spot images taken during ERCP with metallic stent ?deployment as described. Refer to dedicated procedure report for ?full details. ?  ?03/28/2021 Procedure  ? UPPER ENDOSCOPIC ULTRASOUND ? ?- There was dilation in the common bile duct which measured up to 15 mm. ?- A few abnormal lymph nodes were visualized in the porta hepatis region. ?- A mass was identified in the pancreatic head. This was staged T3 N1 Mx by ?endosonographic criteria. Fine needle aspiration performed. ?- There was no evidence of significant pathology in the left lobe of the liver. ? ?Findings: ?There was dilation in the common bile duct which measured up to 15 mm. ?A few abnormal lymph nodes were visualized in the porta hepatis region. The nodes were hypoechoic and ?had poorly defined margins. ?An irregular mass was identified in the pancreatic head. The mass was hypoechoic. The mass measured ?25 mm by 20 mm in maximal cross-sectional diameter. The endosonographic borders were poorlydefined. ?An intact interface was seen between the mass and the superior mesenteric artery, portal vein, ?superior mesenteric vein and splenic vein suggesting a lack of invasion. The remainder of the pancreas ?was examined. The endosonographic appearance of parenchyma and the upstream pancreatic duct ?indicated duct dilation. Fine needle aspiration for cytology was performed. Color Doppler imaging was ?utilized prior to needle puncture to confirm a lack of significant vascular structures within the needle path. ?Three passes were made with the 25 gauge needle using a transduodenal approach. A stylet was used. ?A cytotechnologist was present to evaluate the adequacy of the specimen. Preliminary cytology is ?suspicious for adenocarcinoma (final results are pending). ?  ?03/28/2021 Pathology Results  ? CYTOLOGY  - NON PAP  ?CASE: [id number]  ?PATIENT: Casey Pierce  ?Non-Gynecological Cytology Report  ? ?Clinical History: Obstructive jaudice  ?Specimen Submitted:  A. PANCREAS, HEAD, FINE NEEDLE ASPIRATION:  ? ? ?FINAL MICROSCOPIC DIAGNOSIS:  ?- Malignant cells consistent with adenocarcinoma  ?  ?03/28/2021 Cancer Staging  ? Staging form: Exocrine Pancreas, AJCC 8th Edition ?- Clinical stage from 03/28/2021: Stage IIB (cT2, cN1, cM0) - Signed by Truitt Merle, MD on 04/06/2021 ?Stage prefix: Initial diagnosis ? ?  ?04/05/2021 Initial Diagnosis  ? Pancreatic cancer [facility]) ?  ?04/19/2021 -  Chemotherapy  ? Patient is on Treatment Plan : PANCREAS Modified FOLFIRINOX q14d x 4 cycles  ? ?  ?  ?05/01/2021 Genetic Testing  ? Negative genetic testing. No pathogenic variants identified on the [facility] Multi-Cancer+RNA panel. The report date is 05/14/2021. ? ?The Multi-Cancer Panel + RNA offered by [facility] includes sequencing and/or deletion duplication testing of the following 84 genes: AIP, ALK, APC, ATM, AXIN2,BAP1,  BARD1, BLM, BMPR1A, BRCA1, BRCA2, BRIP1, CASR, CDC73, CDH1, CDK4, CDKN1B, CDKN1C, CDKN2A (p14ARF), CDKN2A (p16INK4a), CEBPA, CHEK2, CTNNA1, DICER1, DIS3L2, EGFR (c.2369C>T, p.Thr790Met variant only), EPCAM (Deletion/duplication testing only), FH, FLCN, GATA2, GPC3, GREM1 (Promoter region deletion/duplication testing only), HOXB13 (c.251G>A, p.Gly84Glu), HRAS, KIT, MAX, MEN1, MET, MITF (c.952G>A, p.Glu318Lys variant only), MLH1, MSH2, MSH3, MSH6, MUTYH, NBN, NF1, NF2, NTHL1, PALB2, PDGFRA, PHOX2B, PMS2, POLD1, POLE, POT1, PRKAR1A, PTCH1, PTEN, RAD50, RAD51C, RAD51D, RB1, RECQL4, RET, RUNX1, SDHAF2, SDHA (sequence changes only), SDHB, SDHC, SDHD, SMAD4, SMARCA4, SMARCB1, SMARCE1,  STK11, SUFU, TERC, TERT, TMEM127, TP53, TSC1, TSC2, VHL, WRN and WT1. ?  ?07/05/2021 Imaging  ? EXAM: ?CT CHEST WITHOUT, CT ABDOMEN AND PELVIS WITH AND WITHOUT CONTRAST ? ?IMPRESSION: ?1. Today's study demonstrates positive response to therapy with no  discernible residual pancreatic head mass confidently identified on today's examination. Common bile duct stent appears appropriately located, and there is no residual intrahepatic biliary ductal dilatation. ?2. N

## 2021-07-26 NOTE — Telephone Encounter (Signed)
Patient with diagnosis of Afib on Eliquis for anticoagulation.   ? ?Procedure: PANCREATIC WHIPPLE ?Date of procedure: TBD ? ? ?CHA2DS2-VASc Score = 4  ? This indicates a 4.8% annual risk of stroke. ?The patient's score is based upon: ?CHF History: 0 ?HTN History: 0 ?Diabetes History: 0 ?Stroke History: 2 ?Vascular Disease History: 1 ?Age Score: 1 ?Gender Score: 0 ?  ?  ? ?CrCl 105 ml/min ? ?Pt is at higher risk due to hx of stroke.  ?ACC/AHA does not recommend lovenox for DOACs. ? ?Per office protocol, patient can hold Eliquis for 2 days prior to procedure.   ? ?

## 2021-07-26 NOTE — Telephone Encounter (Signed)
Per pre op provider Almyra Deforest, [facility] pt needs to be seen urgently.Dr. [name] not in the office, nor appts until June per [place], [place] scheduler. Per Almyra Deforest, PAC have pt seen by DOD. Pt is scheduled for DR. Tamala Julian (DOD) 07/28/21 @ 9:30. Pt is having Whipple Procedure for pancreatic cancer. I will send FYI to surgeon office pt has appt 07/28/21. Once Dr. Tamala Julian has cleared the pt he will  have his nurse fax over his ov note giving clearance and any medication recommendations. I will remove from the pre op call back pool.  ?

## 2021-07-27 LAB — CANCER ANTIGEN 19-9: CA 19-9: 106 U/mL — ABNORMAL HIGH (ref 0–35)

## 2021-07-27 NOTE — Progress Notes (Signed)
?Cardiology Office Note:   ? ?Date:  07/28/2021  ? ?ID:  Casey Radon., DOB 1955/12/14, MRN [id number] ? ?PCP:  Lavone Orn, MD  ?Cardiologist:  Virl Axe, MD  ? ?Referring MD: Lavone Orn, MD  ? ?Chief Complaint  ?Patient presents with  ? Advice Only  ?  Preoperative cardiovascular clearance  ? Atrial Fibrillation  ? Follow-up  ?  Chronic anticoagulation  ? ? ?History of Present Illness:   ? ?Casey Pierce. is a 66 y.o. male with a hx of CAD with bare-metal stent LAD 2000, pancreatic cancer, CVA, and for preop clearance prior to Whipple procedure for pancreatic cancer. ? ? ?Casey Pierce has history of coronary artery disease, had bare-metal stent to the LAD in 2000 and has been asymptomatic relative to CAD since that time.  His most recent cardiac concerns have been chronic atrial fibrillation for which she has seen Dr. [name].  He is in permanent atrial fibrillation without heart failure signs or symptoms.  Until December he played golf on a regular basis without angina or shortness of breath.  He developed painless jaundice at that time and has the current diagnosis of pancreatic cancer.  He is status post biliary stent.  He has not been active mostly he states because of side effects from chemotherapy.  He is set to have Whipple procedure done in early July. ? ?He denies orthopnea, PND, claudication, angina, syncope, and dyspnea on exertion. ? ?Past Medical History:  ?Diagnosis Date  ? Atrial fibrillation ([place])   ? Coronary artery disease   ? NSTEMI 12/1998 - LHC:  prox 85-90, RCA prox 30-50, EF 65-70 >> OFB:PZWC and BMS to LAD; large Dx jailed by stent (90%), EF 60  // Myoview 7/18: normal perfusion, Low Risk  ? History of echocardiogram   ? Echo 7/18: mild LVH, EF 60-65, no RWMA, mild MR, mod LAE  ? Myocardial infarction [facility])   ? Pancreatic cancer ([place])   ? Stroke [facility]) 04/2014  ? ? ?Past Surgical History:  ?Procedure Laterality Date  ? BILIARY STENT PLACEMENT N/A 03/28/2021  ? Procedure: BILIARY  STENT PLACEMENT;  Surgeon: Clarene Essex, MD;  Location: [facility] ENDOSCOPY;  Service: Endoscopy;  Laterality: N/A;  ? CARDIOVERSION N/A 06/20/2015  ? Procedure: CARDIOVERSION;  Surgeon: [name], MD;  Location: [facility] ENDOSCOPY;  Service: Cardiovascular;  Laterality: N/A;  ? COLONOSCOPY WITH PROPOFOL N/A 02/07/2015  ? Procedure: COLONOSCOPY WITH PROPOFOL;  Surgeon: [name], MD;  Location: [facility] ENDOSCOPY;  Service: Endoscopy;  Laterality: N/A;  ? CORONARY STENT PLACEMENT  2001  ? ERCP N/A 03/28/2021  ? Procedure: ENDOSCOPIC RETROGRADE CHOLANGIOPANCREATOGRAPHY (ERCP);  Surgeon: Clarene Essex, MD;  Location: [facility];  Service: Endoscopy;  Laterality: N/A;  ? ESOPHAGOGASTRODUODENOSCOPY N/A 03/28/2021  ? Procedure: ESOPHAGOGASTRODUODENOSCOPY (EGD);  Surgeon: Arta Silence, MD;  Location: [facility];  Service: Gastroenterology;  Laterality: N/A;  ? EUS N/A 03/28/2021  ? Procedure: FULL UPPER ENDOSCOPIC ULTRASOUND (EUS) RADIAL;  Surgeon: Arta Silence, MD;  Location: [facility] ENDOSCOPY;  Service: Gastroenterology;  Laterality: N/A;  ? FINE NEEDLE ASPIRATION N/A 03/28/2021  ? Procedure: FINE NEEDLE ASPIRATION (FNA) LINEAR;  Surgeon: Arta Silence, MD;  Location: [facility] ENDOSCOPY;  Service: Gastroenterology;  Laterality: N/A;  ? [place]  ? PORTACATH PLACEMENT N/A 04/18/2021  ? Procedure: PORT PLACEMENT;  Surgeon: Stark Klein, MD;  Location: [facility];  Service: General;  Laterality: N/A;  ? SPHINCTEROTOMY  03/28/2021  ? Procedure: SPHINCTEROTOMY;  Surgeon: Clarene Essex, MD;  Location: [facility] ENDOSCOPY;  Service: Endoscopy;;  ? ? ?Current Medications: ?Current Meds  ?Medication Sig  ? apixaban (ELIQUIS) 5 MG TABS tablet Take 1 tablet (5 mg total) by mouth 2 (two) times daily.  ? atorvastatin (LIPITOR) 10 MG tablet 1 tablet  ? famotidine (PEPCID) 20 MG tablet 1 tablet at bedtime as needed  ? lidocaine-prilocaine (EMLA) cream Apply to affected area once  ? Multiple Vitamin (MULTI-VITAMIN DAILY PO) Take by  mouth daily.  ? Omega-3 Fatty Acids (FISH OIL PO) Take by mouth daily.  ? ondansetron (ZOFRAN) 8 MG tablet Take 1 tablet (8 mg total) by mouth 2 (two) times daily as needed. Start on day 3 after chemotherapy.  ? prochlorperazine (COMPAZINE) 10 MG tablet Take 1 tablet (10 mg total) by mouth every 6 (six) hours as needed (Nausea or vomiting).  ?  ? ?Allergies:   Patient has no known allergies.  ? ?Social History  ? ?Socioeconomic History  ? Marital status: Married  ?  Spouse name: Chong Sicilian  ? Number of children: 3  ? Years of education: Associate  ? Highest education level: Not on file  ?Occupational History  ? Occupation: [employer]   ?Tobacco Use  ? Smoking status: Never  ? Smokeless tobacco: Former  ?  Types: Chew  ?  Quit date: 03/20/1999  ? Tobacco comments:  ?  quit chewing 7 years ago  ?Vaping Use  ? Vaping Use: Never used  ?Substance and Sexual Activity  ? Alcohol use: No  ?  Alcohol/week: 0.0 standard drinks  ? Drug use: No  ? Sexual activity: Not on file  ?Other Topics Concern  ? Not on file  ?Social History Narrative  ? Lives at home with wife.   ? Caffeine: Drinks coffee- about 3 cups per day  ? ?Social Determinants of Health  ? ?Financial Resource Strain: Not on file  ?Food Insecurity: Not on file  ?Transportation Needs: Not on file  ?Physical Activity: Not on file  ?Stress: Not on file  ?Social Connections: Not on file  ?  ? ?Family History: ?The patient's family history includes Cancer in his paternal uncle; Cancer (age of onset: 43) in his father; Heart attack in his maternal uncle. ? ?ROS:   ?Please see the history of present illness.    ?Appetite has not been as good as prior.  All other systems reviewed and are negative. ? ?EKGs/Labs/Other Studies Reviewed:   ? ?The following studies were reviewed today: ?2D Doppler echocardiogram performed 09/18/2016: ?Study Conclusions  ? ?- Left ventricle: The cavity size was normal. Wall thickness was  ?  increased in a pattern of mild LVH. Systolic function  was normal.  ?  The estimated ejection fraction was in the range of 60% to 65%.  ?  Wall motion was normal; there were no regional wall motion  ?  abnormalities.  ?- Mitral valve: There was mild regurgitation.  ?- Left atrium: The atrium was moderately dilated.  ? ?EKG: Atrial fibrillation with controlled rate.  Right bundle branch block, left axis deviation, possible old inferior infarct.  Compared to the most recent tracing of September 2022, no significant changes noted. ? ?Recent Labs: ?07/26/2021: ALT 55; BUN 7; Creatinine 0.66; Hemoglobin 12.9; Platelet Count 79; Potassium 3.7; Sodium 138  ?Recent Lipid Panel ?   ?Component Value Date/Time  ? CHOL 168 05/04/2014 0110  ? TRIG 362 (H) 05/04/2014 0110  ? HDL 30 (L) 05/04/2014 0110  ? CHOLHDL 5.6 05/04/2014 0110  ?  VLDL 72 (H) 05/04/2014 0110  ? [place] 66 05/04/2014 0110  ? ? ?Physical Exam:   ? ?VS:  BP 122/64   Pulse 85   Ht '5\' 5"'$  (1.651 m)   Wt 153 lb (69.4 kg)   SpO2 98%   BMI 25.46 kg/m?    ? ?Wt Readings from Last 3 Encounters:  ?07/28/21 153 lb (69.4 kg)  ?07/26/21 151 lb 8 oz (68.7 kg)  ?07/12/21 154 lb (69.9 kg)  ?  ? ?GEN: Somewhat chronically ill-appearing.. No acute distress ?HEENT: Normal ?NECK: No JVD. ?LYMPHATICS: No lymphadenopathy ?CARDIAC: No murmur. IIRR no gallop, or edema. ?VASCULAR:  Normal Pulses. No bruits. ?RESPIRATORY:  Clear to auscultation without rales, wheezing or rhonchi  ?ABDOMEN: Soft, non-tender, non-distended, No pulsatile mass, ?MUSCULOSKELETAL: No deformity  ?SKIN: Warm and dry ?NEUROLOGIC:  Alert and oriented x 3 ?PSYCHIATRIC:  Normal affect  ? ?ASSESSMENT:   ? ?1. Preoperative clearance   ?2. Permanent atrial fibrillation ([place])   ?3. Coronary artery disease involving native coronary artery of native heart with angina pectoris ([place])   ?4. History of stroke   ?5. RBBB   ?6. Malignant neoplasm of head of pancreas ([place])   ?7. Hyperlipidemia, unspecified hyperlipidemia type   ? ?PLAN:   ? ?In order of problems listed  above: ? ?He will undergo Lexiscan myocardial perfusion imaging.  His current state of physical activity does not allow assessment of risk to allow appropriate assessment for preoperative clearance prior to a major opera

## 2021-07-28 ENCOUNTER — Encounter: Payer: Self-pay | Admitting: Interventional Cardiology

## 2021-07-28 ENCOUNTER — Telehealth: Payer: Self-pay | Admitting: Hematology

## 2021-07-28 ENCOUNTER — Ambulatory Visit: Payer: Medicare PPO | Admitting: Interventional Cardiology

## 2021-07-28 ENCOUNTER — Inpatient Hospital Stay: Payer: Medicare PPO

## 2021-07-28 VITALS — BP 122/64 | HR 85 | Ht 65.0 in | Wt 153.0 lb

## 2021-07-28 DIAGNOSIS — Z8673 Personal history of transient ischemic attack (TIA), and cerebral infarction without residual deficits: Secondary | ICD-10-CM | POA: Diagnosis not present

## 2021-07-28 DIAGNOSIS — C250 Malignant neoplasm of head of pancreas: Secondary | ICD-10-CM

## 2021-07-28 DIAGNOSIS — E785 Hyperlipidemia, unspecified: Secondary | ICD-10-CM | POA: Diagnosis not present

## 2021-07-28 DIAGNOSIS — I4510 Unspecified right bundle-branch block: Secondary | ICD-10-CM | POA: Diagnosis not present

## 2021-07-28 DIAGNOSIS — Z01818 Encounter for other preprocedural examination: Secondary | ICD-10-CM | POA: Diagnosis not present

## 2021-07-28 DIAGNOSIS — I4821 Permanent atrial fibrillation: Secondary | ICD-10-CM

## 2021-07-28 DIAGNOSIS — I25119 Atherosclerotic heart disease of native coronary artery with unspecified angina pectoris: Secondary | ICD-10-CM

## 2021-07-28 NOTE — Telephone Encounter (Signed)
Scheduled follow-up appointment per 5/12 staff message. Patient is aware. ?

## 2021-07-28 NOTE — Patient Instructions (Signed)
Medication Instructions:  ?Your physician recommends that you continue on your current medications as directed. Please refer to the Current Medication list given to you today. ? ?*If you need a refill on your cardiac medications before your next appointment, please call your pharmacy* ? ?Lab Work: ?NONE ? ?Testing/Procedures: ?Your physician has requested that you have a lexiscan myoview. For further information please visit [email or web address]. Please follow instruction sheet, as given.  ? ?Follow-Up: ?At [facility], you and your health needs are our priority.  As part of our continuing mission to provide you with exceptional heart care, we have created designated Provider Care Teams.  These Care Teams include your primary Cardiologist (physician) and Advanced Practice Providers (APPs -  Physician Assistants and Nurse Practitioners) who all work together to provide you with the care you need, when you need it. ? ?Your next appointment:   ?1 year(s) ? ?The format for your next appointment:   ?In Person ? ?Provider:   ?Virl Axe, MD { ? ? ?Important Information About Sugar ? ? ? ? ?  ?

## 2021-08-02 ENCOUNTER — Inpatient Hospital Stay: Payer: Medicare PPO

## 2021-08-02 ENCOUNTER — Other Ambulatory Visit: Payer: Self-pay

## 2021-08-02 ENCOUNTER — Telehealth: Payer: Self-pay | Admitting: Hematology

## 2021-08-02 ENCOUNTER — Other Ambulatory Visit: Payer: Self-pay | Admitting: Hematology

## 2021-08-02 ENCOUNTER — Inpatient Hospital Stay: Payer: Medicare PPO | Admitting: Dietician

## 2021-08-02 VITALS — BP 124/79 | HR 85 | Temp 99.0°F | Resp 18 | Wt 155.0 lb

## 2021-08-02 DIAGNOSIS — R0602 Shortness of breath: Secondary | ICD-10-CM | POA: Diagnosis not present

## 2021-08-02 DIAGNOSIS — K8309 Other cholangitis: Secondary | ICD-10-CM | POA: Diagnosis present

## 2021-08-02 DIAGNOSIS — D6959 Other secondary thrombocytopenia: Secondary | ICD-10-CM | POA: Diagnosis present

## 2021-08-02 DIAGNOSIS — R001 Bradycardia, unspecified: Secondary | ICD-10-CM | POA: Diagnosis not present

## 2021-08-02 DIAGNOSIS — K6389 Other specified diseases of intestine: Secondary | ICD-10-CM | POA: Diagnosis not present

## 2021-08-02 DIAGNOSIS — C250 Malignant neoplasm of head of pancreas: Secondary | ICD-10-CM | POA: Diagnosis not present

## 2021-08-02 DIAGNOSIS — I252 Old myocardial infarction: Secondary | ICD-10-CM | POA: Diagnosis not present

## 2021-08-02 DIAGNOSIS — K838 Other specified diseases of biliary tract: Secondary | ICD-10-CM | POA: Diagnosis not present

## 2021-08-02 DIAGNOSIS — R300 Dysuria: Secondary | ICD-10-CM | POA: Diagnosis present

## 2021-08-02 DIAGNOSIS — T8579XA Infection and inflammatory reaction due to other internal prosthetic devices, implants and grafts, initial encounter: Secondary | ICD-10-CM | POA: Diagnosis present

## 2021-08-02 DIAGNOSIS — R000 Tachycardia, unspecified: Secondary | ICD-10-CM | POA: Diagnosis not present

## 2021-08-02 DIAGNOSIS — I4821 Permanent atrial fibrillation: Secondary | ICD-10-CM | POA: Diagnosis present

## 2021-08-02 DIAGNOSIS — Z4659 Encounter for fitting and adjustment of other gastrointestinal appliance and device: Secondary | ICD-10-CM | POA: Diagnosis not present

## 2021-08-02 DIAGNOSIS — R932 Abnormal findings on diagnostic imaging of liver and biliary tract: Secondary | ICD-10-CM | POA: Diagnosis not present

## 2021-08-02 DIAGNOSIS — Z8249 Family history of ischemic heart disease and other diseases of the circulatory system: Secondary | ICD-10-CM | POA: Diagnosis not present

## 2021-08-02 DIAGNOSIS — Z955 Presence of coronary angioplasty implant and graft: Secondary | ICD-10-CM | POA: Diagnosis not present

## 2021-08-02 DIAGNOSIS — T451X5A Adverse effect of antineoplastic and immunosuppressive drugs, initial encounter: Secondary | ICD-10-CM | POA: Diagnosis present

## 2021-08-02 DIAGNOSIS — K831 Obstruction of bile duct: Secondary | ICD-10-CM | POA: Diagnosis present

## 2021-08-02 DIAGNOSIS — R1011 Right upper quadrant pain: Secondary | ICD-10-CM | POA: Diagnosis not present

## 2021-08-02 DIAGNOSIS — K0889 Other specified disorders of teeth and supporting structures: Secondary | ICD-10-CM | POA: Diagnosis not present

## 2021-08-02 DIAGNOSIS — I2510 Atherosclerotic heart disease of native coronary artery without angina pectoris: Secondary | ICD-10-CM | POA: Diagnosis not present

## 2021-08-02 DIAGNOSIS — Z20822 Contact with and (suspected) exposure to covid-19: Secondary | ICD-10-CM | POA: Diagnosis present

## 2021-08-02 DIAGNOSIS — Z87891 Personal history of nicotine dependence: Secondary | ICD-10-CM | POA: Diagnosis not present

## 2021-08-02 DIAGNOSIS — C259 Malignant neoplasm of pancreas, unspecified: Secondary | ICD-10-CM | POA: Diagnosis not present

## 2021-08-02 DIAGNOSIS — R7989 Other specified abnormal findings of blood chemistry: Secondary | ICD-10-CM | POA: Diagnosis present

## 2021-08-02 DIAGNOSIS — I4891 Unspecified atrial fibrillation: Secondary | ICD-10-CM | POA: Diagnosis not present

## 2021-08-02 DIAGNOSIS — K529 Noninfective gastroenteritis and colitis, unspecified: Secondary | ICD-10-CM | POA: Diagnosis present

## 2021-08-02 DIAGNOSIS — Y831 Surgical operation with implant of artificial internal device as the cause of abnormal reaction of the patient, or of later complication, without mention of misadventure at the time of the procedure: Secondary | ICD-10-CM | POA: Diagnosis present

## 2021-08-02 DIAGNOSIS — R748 Abnormal levels of other serum enzymes: Secondary | ICD-10-CM | POA: Diagnosis not present

## 2021-08-02 DIAGNOSIS — E872 Acidosis, unspecified: Secondary | ICD-10-CM | POA: Diagnosis present

## 2021-08-02 DIAGNOSIS — R509 Fever, unspecified: Secondary | ICD-10-CM | POA: Diagnosis not present

## 2021-08-02 DIAGNOSIS — R7401 Elevation of levels of liver transaminase levels: Secondary | ICD-10-CM | POA: Diagnosis not present

## 2021-08-02 DIAGNOSIS — Z8673 Personal history of transient ischemic attack (TIA), and cerebral infarction without residual deficits: Secondary | ICD-10-CM | POA: Diagnosis not present

## 2021-08-02 DIAGNOSIS — Z7901 Long term (current) use of anticoagulants: Secondary | ICD-10-CM | POA: Diagnosis not present

## 2021-08-02 DIAGNOSIS — Z0389 Encounter for observation for other suspected diseases and conditions ruled out: Secondary | ICD-10-CM | POA: Diagnosis not present

## 2021-08-02 DIAGNOSIS — A419 Sepsis, unspecified organism: Secondary | ICD-10-CM | POA: Diagnosis present

## 2021-08-02 DIAGNOSIS — R1084 Generalized abdominal pain: Secondary | ICD-10-CM | POA: Diagnosis not present

## 2021-08-02 DIAGNOSIS — T85590A Other mechanical complication of bile duct prosthesis, initial encounter: Secondary | ICD-10-CM | POA: Diagnosis present

## 2021-08-02 DIAGNOSIS — I25119 Atherosclerotic heart disease of native coronary artery with unspecified angina pectoris: Secondary | ICD-10-CM | POA: Diagnosis present

## 2021-08-02 DIAGNOSIS — D6481 Anemia due to antineoplastic chemotherapy: Secondary | ICD-10-CM | POA: Diagnosis present

## 2021-08-02 LAB — CMP (CANCER CENTER ONLY)
ALT: 122 U/L — ABNORMAL HIGH (ref 0–44)
AST: 103 U/L — ABNORMAL HIGH (ref 15–41)
Albumin: 3.4 g/dL — ABNORMAL LOW (ref 3.5–5.0)
Alkaline Phosphatase: 97 U/L (ref 38–126)
Anion gap: 7 (ref 5–15)
BUN: 5 mg/dL — ABNORMAL LOW (ref 8–23)
CO2: 27 mmol/L (ref 22–32)
Calcium: 9.2 mg/dL (ref 8.9–10.3)
Chloride: 103 mmol/L (ref 98–111)
Creatinine: 0.57 mg/dL — ABNORMAL LOW (ref 0.61–1.24)
GFR, Estimated: >60 mL/min (ref >60)
Glucose, Bld: 192 mg/dL — ABNORMAL HIGH (ref 70–99)
Potassium: 3.8 mmol/L (ref 3.5–5.1)
Sodium: 137 mmol/L (ref 135–145)
Total Bilirubin: 0.5 mg/dL (ref 0.3–1.2)
Total Protein: 6.1 g/dL — ABNORMAL LOW (ref 6.5–8.1)

## 2021-08-02 LAB — CBC WITH DIFFERENTIAL (CANCER CENTER ONLY)
Abs Immature Granulocytes: 0.04 10*3/uL (ref 0.00–0.07)
Basophils Absolute: 0.1 10*3/uL (ref 0.0–0.1)
Basophils Relative: 1 %
Eosinophils Absolute: 0 10*3/uL (ref 0.0–0.5)
Eosinophils Relative: 1 %
HCT: 36.9 % — ABNORMAL LOW (ref 39.0–52.0)
Hemoglobin: 12.9 g/dL — ABNORMAL LOW (ref 13.0–17.0)
Immature Granulocytes: 1 %
Lymphocytes Relative: 14 %
Lymphs Abs: 1.2 10*3/uL (ref 0.7–4.0)
MCH: 33.9 pg (ref 26.0–34.0)
MCHC: 35 g/dL (ref 30.0–36.0)
MCV: 97.1 fL (ref 80.0–100.0)
Monocytes Absolute: 0.9 10*3/uL (ref 0.1–1.0)
Monocytes Relative: 10 %
Neutro Abs: 6.5 10*3/uL (ref 1.7–7.7)
Neutrophils Relative %: 73 %
Platelet Count: 145 10*3/uL — ABNORMAL LOW (ref 150–400)
RBC: 3.8 MIL/uL — ABNORMAL LOW (ref 4.22–5.81)
RDW: 16.6 % — ABNORMAL HIGH (ref 11.5–15.5)
WBC Count: 8.7 10*3/uL (ref 4.0–10.5)
nRBC: 0 % (ref 0.0–0.2)

## 2021-08-02 MED ORDER — SODIUM CHLORIDE 0.9 % IV SOLN
150.0000 mg/m2 | Freq: Once | INTRAVENOUS | Status: AC
  Administered 2021-08-02: 280 mg via INTRAVENOUS
  Filled 2021-08-02: qty 14

## 2021-08-02 MED ORDER — SODIUM CHLORIDE 0.9 % IV SOLN
400.0000 mg/m2 | Freq: Once | INTRAVENOUS | Status: AC
  Administered 2021-08-02: 724 mg via INTRAVENOUS
  Filled 2021-08-02: qty 36.2

## 2021-08-02 MED ORDER — SODIUM CHLORIDE 0.9% FLUSH
10.0000 mL | Freq: Once | INTRAVENOUS | Status: AC
  Administered 2021-08-02: 10 mL

## 2021-08-02 MED ORDER — SODIUM CHLORIDE 0.9 % IV SOLN
150.0000 mg | Freq: Once | INTRAVENOUS | Status: AC
  Administered 2021-08-02: 150 mg via INTRAVENOUS
  Filled 2021-08-02: qty 150

## 2021-08-02 MED ORDER — ATROPINE SULFATE 1 MG/ML IV SOLN
0.5000 mg | Freq: Once | INTRAVENOUS | Status: AC | PRN
  Administered 2021-08-02: 0.5 mg via INTRAVENOUS
  Filled 2021-08-02: qty 1

## 2021-08-02 MED ORDER — DEXTROSE 5 % IV SOLN: Freq: Once | INTRAVENOUS | Status: AC

## 2021-08-02 MED ORDER — OXALIPLATIN CHEMO INJECTION 100 MG/20ML
60.0000 mg/m2 | Freq: Once | INTRAVENOUS | Status: AC
  Administered 2021-08-02: 110 mg via INTRAVENOUS
  Filled 2021-08-02: qty 22

## 2021-08-02 MED ORDER — SODIUM CHLORIDE 0.9 % IV SOLN
2400.0000 mg/m2 | INTRAVENOUS | Status: DC
  Administered 2021-08-02: 4350 mg via INTRAVENOUS
  Filled 2021-08-02: qty 87

## 2021-08-02 MED ORDER — PALONOSETRON HCL INJECTION 0.25 MG/5ML
0.2500 mg | Freq: Once | INTRAVENOUS | Status: AC
  Administered 2021-08-02: 0.25 mg via INTRAVENOUS
  Filled 2021-08-02: qty 5

## 2021-08-02 MED ORDER — SODIUM CHLORIDE 0.9 % IV SOLN
10.0000 mg | Freq: Once | INTRAVENOUS | Status: AC
  Administered 2021-08-02: 10 mg via INTRAVENOUS
  Filled 2021-08-02: qty 10

## 2021-08-02 NOTE — Progress Notes (Signed)
Nutrition Assessment ? ? ?Reason for Assessment: MST ? ? ?ASSESSMENT: 66 year old male with stage IIB pancreatic cancer. He is receiving neoadjuvant FOLFIRNOX q14d x 4 cycles (started 04/19/21). Patient is scheduled for Whipple on July 5 with Dr. Barry Dienes.  ? ?Past medical history includes atrial fibrillation, CAD, CVA, MI ? ?Met with patient in infusion. He reports appetite and cold sensitivity has improved with chemo break last week. Patient was able to enjoy eating ice cream over the past few days. Reports this is helped him to gain some weight back. Patient recalls eating 3 meals daily. Breakfast is usually bacon, toast, eggs. Recalls bowl of soup, tomato sandwich, or salads for lunch. Patient had pork chops, pintos, and cabbage for supper. Patient reports no eating much meat. He will occasionally eat chicken or fish (talipa, tuna). Patient is drinking water, green tea, and gatorade. He drinks Boost sometimes.  ? ? ? ?Medications: MVI, compazine, zofran, eliquis, lipitor, pepcid ? ? ?Labs: glucose 192, BUN 5, Cr 0.57 ? ? ?Anthropometrics:  ? ?Height: 5'5" ?Weight: 155 lb  ?UBW: 160-165 lb per pt (165 lb on 11/29/20) ?BMI: 25.79 ? ?5/12- 153 lb ?4/12 - 156 lb ? ? ?NUTRITION DIAGNOSIS: Inadequate oral intake related to side effects of chemotherapy as evidenced by reported worsening cold sensitivity, decreased appetite, 6% decrease from usual weight in the last 6 months ? ? ?INTERVENTION:  ?Educated on importance of adequate calories and protein to maintain weights, strength, support postop healing  ?Encouraged high protein snacks in between meals and at bedtime ?Discussed foods that provide good sources of protein, educated to include protein source with meals/snacks - handout with ideas provided ?Continue drinking Boost Plus/equivalent, recommend 1-2/day - coupons provided ?Provided suggestions on warm supplement ideas -recipes given ?Contact information provided  ? ? ? ?MONITORING, EVALUATION, GOAL: Patient will  tolerate increased calories and protein to minimize further weight loss  ? ? ?Next Visit: To be scheduled  ? ? ? ? ? ? ?

## 2021-08-02 NOTE — Patient Instructions (Signed)
[facility]  Discharge Instructions: ?Thank you for choosing [facility] to provide your oncology and hematology care.  ? ?If you have a lab appointment with the [facility], please go directly to the [facility] and check in at the registration area. ?  ?Wear comfortable clothing and clothing appropriate for easy access to any Portacath or PICC line.  ? ?We strive to give you quality time with your provider. You may need to reschedule your appointment if you arrive late (15 or more minutes).  Arriving late affects you and other patients whose appointments are after yours.  Also, if you miss three or more appointments without notifying the office, you may be dismissed from the clinic at the provider?s discretion.    ?  ?For prescription refill requests, have your pharmacy contact our office and allow 72 hours for refills to be completed.   ? ?Today you received the following chemotherapy and/or immunotherapy agents: Oxaliplatin, Irinotecan, Fluorouracil, Leucovorin.     ?  ?To help prevent nausea and vomiting after your treatment, we encourage you to take your nausea medication as directed. ? ?BELOW ARE SYMPTOMS THAT SHOULD BE REPORTED IMMEDIATELY: ?*FEVER GREATER THAN 100.4 F (38 ?C) OR HIGHER ?*CHILLS OR SWEATING ?*NAUSEA AND VOMITING THAT IS NOT CONTROLLED WITH YOUR NAUSEA MEDICATION ?*UNUSUAL SHORTNESS OF BREATH ?*UNUSUAL BRUISING OR BLEEDING ?*URINARY PROBLEMS (pain or burning when urinating, or frequent urination) ?*BOWEL PROBLEMS (unusual diarrhea, constipation, pain near the anus) ?TENDERNESS IN MOUTH AND THROAT WITH OR WITHOUT PRESENCE OF ULCERS (sore throat, sores in mouth, or a toothache) ?UNUSUAL RASH, SWELLING OR PAIN  ?UNUSUAL VAGINAL DISCHARGE OR ITCHING  ? ?Items with * indicate a potential emergency and should be followed up as soon as possible or go to the Emergency Department if any problems should occur. ? ?Please show the CHEMOTHERAPY ALERT CARD  or IMMUNOTHERAPY ALERT CARD at check-in to the Emergency Department and triage nurse. ? ?Should you have questions after your visit or need to cancel or reschedule your appointment, please contact [facility]  Dept: [phone]  and follow the prompts.  Office hours are 8:00 a.m. to 4:30 p.m. Monday - Friday. Please note that voicemails left after 4:00 p.m. may not be returned until the following business day.  We are closed weekends and major holidays. You have access to a nurse at all times for urgent questions. Please call the main number to the clinic Dept: [phone] and follow the prompts. ? ? ?For any non-urgent questions, you may also contact your provider using MyChart. We now offer e-Visits for anyone 58 and older to request care online for non-urgent symptoms. For details visit [email or web address]. ?  ?Also download the MyChart app! Go to the app store, search "MyChart", open the app, select [facility], and log in with your MyChart username and password. ? ?Due to Covid, a mask is required upon entering the hospital/clinic. If you do not have a mask, one will be given to you upon arrival. For doctor visits, patients may have 1 support person aged 59 or older with them. For treatment visits, patients cannot have anyone with them due to current Covid guidelines and our immunocompromised population.  ? ?

## 2021-08-02 NOTE — Progress Notes (Signed)
Per Burr Medico MD, ok to treat today with AST & ALT elevated at 103 and 122.  ?

## 2021-08-02 NOTE — Telephone Encounter (Signed)
Scheduled follow-up appointments per 5/10 los. Patient's wife is aware. ?

## 2021-08-04 ENCOUNTER — Other Ambulatory Visit: Payer: Self-pay

## 2021-08-04 ENCOUNTER — Inpatient Hospital Stay: Payer: Medicare PPO

## 2021-08-04 VITALS — BP 106/71 | HR 85 | Temp 98.2°F | Resp 18

## 2021-08-04 DIAGNOSIS — C250 Malignant neoplasm of head of pancreas: Secondary | ICD-10-CM

## 2021-08-04 MED ORDER — SODIUM CHLORIDE 0.9% FLUSH
10.0000 mL | Freq: Once | INTRAVENOUS | Status: AC
  Administered 2021-08-04: 10 mL

## 2021-08-04 MED ORDER — PEGFILGRASTIM-CBQV 6 MG/0.6ML ~~LOC~~ SOSY
6.0000 mg | PREFILLED_SYRINGE | Freq: Once | SUBCUTANEOUS | Status: AC
  Administered 2021-08-04: 6 mg via SUBCUTANEOUS
  Filled 2021-08-04: qty 0.6

## 2021-08-05 ENCOUNTER — Other Ambulatory Visit: Payer: Self-pay

## 2021-08-05 ENCOUNTER — Encounter (HOSPITAL_COMMUNITY): Payer: Self-pay | Admitting: Emergency Medicine

## 2021-08-05 ENCOUNTER — Emergency Department (HOSPITAL_COMMUNITY): Payer: Medicare PPO

## 2021-08-05 ENCOUNTER — Inpatient Hospital Stay (HOSPITAL_COMMUNITY)
Admission: EM | Admit: 2021-08-05 | Discharge: 2021-08-07 | DRG: 919 | Disposition: A | Discharge status: Home / Self Care | Payer: Medicare PPO | Attending: Internal Medicine | Admitting: Internal Medicine

## 2021-08-05 DIAGNOSIS — Z8673 Personal history of transient ischemic attack (TIA), and cerebral infarction without residual deficits: Secondary | ICD-10-CM

## 2021-08-05 DIAGNOSIS — C250 Malignant neoplasm of head of pancreas: Secondary | ICD-10-CM | POA: Diagnosis present

## 2021-08-05 DIAGNOSIS — Z87891 Personal history of nicotine dependence: Secondary | ICD-10-CM | POA: Diagnosis not present

## 2021-08-05 DIAGNOSIS — R1011 Right upper quadrant pain: Principal | ICD-10-CM

## 2021-08-05 DIAGNOSIS — D6959 Other secondary thrombocytopenia: Secondary | ICD-10-CM | POA: Diagnosis present

## 2021-08-05 DIAGNOSIS — E872 Acidosis, unspecified: Secondary | ICD-10-CM | POA: Diagnosis present

## 2021-08-05 DIAGNOSIS — T85590A Other mechanical complication of bile duct prosthesis, initial encounter: Secondary | ICD-10-CM | POA: Diagnosis present

## 2021-08-05 DIAGNOSIS — Z7901 Long term (current) use of anticoagulants: Secondary | ICD-10-CM

## 2021-08-05 DIAGNOSIS — Z8249 Family history of ischemic heart disease and other diseases of the circulatory system: Secondary | ICD-10-CM

## 2021-08-05 DIAGNOSIS — D6481 Anemia due to antineoplastic chemotherapy: Secondary | ICD-10-CM | POA: Diagnosis present

## 2021-08-05 DIAGNOSIS — K529 Noninfective gastroenteritis and colitis, unspecified: Secondary | ICD-10-CM

## 2021-08-05 DIAGNOSIS — K8309 Other cholangitis: Secondary | ICD-10-CM | POA: Diagnosis present

## 2021-08-05 DIAGNOSIS — Y831 Surgical operation with implant of artificial internal device as the cause of abnormal reaction of the patient, or of later complication, without mention of misadventure at the time of the procedure: Secondary | ICD-10-CM | POA: Diagnosis present

## 2021-08-05 DIAGNOSIS — I2510 Atherosclerotic heart disease of native coronary artery without angina pectoris: Secondary | ICD-10-CM | POA: Diagnosis not present

## 2021-08-05 DIAGNOSIS — I25119 Atherosclerotic heart disease of native coronary artery with unspecified angina pectoris: Secondary | ICD-10-CM | POA: Diagnosis present

## 2021-08-05 DIAGNOSIS — T8579XA Infection and inflammatory reaction due to other internal prosthetic devices, implants and grafts, initial encounter: Secondary | ICD-10-CM | POA: Diagnosis present

## 2021-08-05 DIAGNOSIS — Z4659 Encounter for fitting and adjustment of other gastrointestinal appliance and device: Secondary | ICD-10-CM | POA: Diagnosis not present

## 2021-08-05 DIAGNOSIS — A419 Sepsis, unspecified organism: Secondary | ICD-10-CM | POA: Diagnosis present

## 2021-08-05 DIAGNOSIS — I252 Old myocardial infarction: Secondary | ICD-10-CM | POA: Diagnosis not present

## 2021-08-05 DIAGNOSIS — Z20822 Contact with and (suspected) exposure to covid-19: Secondary | ICD-10-CM | POA: Diagnosis present

## 2021-08-05 DIAGNOSIS — T451X5A Adverse effect of antineoplastic and immunosuppressive drugs, initial encounter: Secondary | ICD-10-CM | POA: Diagnosis present

## 2021-08-05 DIAGNOSIS — Z955 Presence of coronary angioplasty implant and graft: Secondary | ICD-10-CM | POA: Diagnosis not present

## 2021-08-05 DIAGNOSIS — C259 Malignant neoplasm of pancreas, unspecified: Secondary | ICD-10-CM | POA: Diagnosis present

## 2021-08-05 DIAGNOSIS — R7989 Other specified abnormal findings of blood chemistry: Secondary | ICD-10-CM | POA: Diagnosis present

## 2021-08-05 DIAGNOSIS — K831 Obstruction of bile duct: Secondary | ICD-10-CM

## 2021-08-05 DIAGNOSIS — R300 Dysuria: Secondary | ICD-10-CM | POA: Diagnosis present

## 2021-08-05 DIAGNOSIS — I4821 Permanent atrial fibrillation: Secondary | ICD-10-CM | POA: Diagnosis present

## 2021-08-05 LAB — URINALYSIS, ROUTINE W REFLEX MICROSCOPIC
Bilirubin Urine: NEGATIVE
Glucose, UA: 50 mg/dL — AB
Hgb urine dipstick: NEGATIVE
Ketones, ur: NEGATIVE mg/dL
Leukocytes,Ua: NEGATIVE
Nitrite: NEGATIVE
Protein, ur: 30 mg/dL — AB
Specific Gravity, Urine: 1.016 (ref 1.005–1.030)
pH: 8 (ref 5.0–8.0)

## 2021-08-05 LAB — PROTIME-INR
INR: 1.2 (ref 0.8–1.2)
Prothrombin Time: 14.9 seconds (ref 11.4–15.2)

## 2021-08-05 LAB — CBC WITH DIFFERENTIAL/PLATELET
Abs Immature Granulocytes: 1.84 10*3/uL — ABNORMAL HIGH (ref 0.00–0.07)
Basophils Absolute: 0.1 10*3/uL (ref 0.0–0.1)
Basophils Relative: 0 %
Eosinophils Absolute: 0 10*3/uL (ref 0.0–0.5)
Eosinophils Relative: 0 %
HCT: 38.9 % — ABNORMAL LOW (ref 39.0–52.0)
Hemoglobin: 13.7 g/dL (ref 13.0–17.0)
Immature Granulocytes: 4 %
Lymphocytes Relative: 1 %
Lymphs Abs: 0.4 10*3/uL — ABNORMAL LOW (ref 0.7–4.0)
MCH: 35.6 pg — ABNORMAL HIGH (ref 26.0–34.0)
MCHC: 35.2 g/dL (ref 30.0–36.0)
MCV: 101 fL — ABNORMAL HIGH (ref 80.0–100.0)
Monocytes Absolute: 0 10*3/uL — ABNORMAL LOW (ref 0.1–1.0)
Monocytes Relative: 0 %
Neutro Abs: 39.9 10*3/uL — ABNORMAL HIGH (ref 1.7–7.7)
Neutrophils Relative %: 95 %
Platelets: 95 10*3/uL — ABNORMAL LOW (ref 150–400)
RBC: 3.85 MIL/uL — ABNORMAL LOW (ref 4.22–5.81)
RDW: 16.9 % — ABNORMAL HIGH (ref 11.5–15.5)
WBC: 42.3 10*3/uL — ABNORMAL HIGH (ref 4.0–10.5)
nRBC: 0 % (ref 0.0–0.2)

## 2021-08-05 LAB — COMPREHENSIVE METABOLIC PANEL WITH GFR
ALT: 514 U/L — ABNORMAL HIGH (ref 0–44)
AST: 350 U/L — ABNORMAL HIGH (ref 15–41)
Albumin: 3.2 g/dL — ABNORMAL LOW (ref 3.5–5.0)
Alkaline Phosphatase: 206 U/L — ABNORMAL HIGH (ref 38–126)
Anion gap: 12 (ref 5–15)
BUN: 14 mg/dL (ref 8–23)
CO2: 25 mmol/L (ref 22–32)
Calcium: 9.1 mg/dL (ref 8.9–10.3)
Chloride: 102 mmol/L (ref 98–111)
Creatinine, Ser: 0.54 mg/dL — ABNORMAL LOW (ref 0.61–1.24)
GFR, Estimated: >60 mL/min
Glucose, Bld: 199 mg/dL — ABNORMAL HIGH (ref 70–99)
Potassium: 3.6 mmol/L (ref 3.5–5.1)
Sodium: 139 mmol/L (ref 135–145)
Total Bilirubin: 6.7 mg/dL — ABNORMAL HIGH (ref 0.3–1.2)
Total Protein: 6.5 g/dL (ref 6.5–8.1)

## 2021-08-05 LAB — LACTIC ACID, PLASMA
Lactic Acid, Venous: 3.9 mmol/L (ref 0.5–1.9)
Lactic Acid, Venous: 5.1 mmol/L (ref 0.5–1.9)
Lactic Acid, Venous: 5.5 mmol/L (ref 0.5–1.9)
Lactic Acid, Venous: 4.4 mmol/L (ref 0.5–1.9)

## 2021-08-05 LAB — TROPONIN I (HIGH SENSITIVITY)
Troponin I (High Sensitivity): 14 ng/L (ref <18)
Troponin I (High Sensitivity): 158 ng/L (ref <18)

## 2021-08-05 LAB — RESP PANEL BY RT-PCR (FLU A&B, COVID) ARPGX2
Influenza A by PCR: NEGATIVE
Influenza B by PCR: NEGATIVE
SARS Coronavirus 2 by RT PCR: NEGATIVE

## 2021-08-05 LAB — APTT: aPTT: 28 seconds (ref 24–36)

## 2021-08-05 LAB — LIPASE, BLOOD: Lipase: 57 U/L — ABNORMAL HIGH (ref 11–51)

## 2021-08-05 MED ORDER — SODIUM CHLORIDE 0.9 % IV SOLN
2.0000 g | Freq: Three times a day (TID) | INTRAVENOUS | Status: DC
  Administered 2021-08-06 – 2021-08-07 (×5): 2 g via INTRAVENOUS
  Filled 2021-08-05 (×5): qty 12.5

## 2021-08-05 MED ORDER — IOHEXOL 300 MG/ML  SOLN: 100.0000 mL | Freq: Once | INTRAMUSCULAR | Status: DC | PRN

## 2021-08-05 MED ORDER — IOHEXOL 350 MG/ML SOLN
100.0000 mL | Freq: Once | INTRAVENOUS | Status: AC | PRN
  Administered 2021-08-05: 100 mL via INTRAVENOUS

## 2021-08-05 MED ORDER — ACETAMINOPHEN 325 MG PO TABS: 650.0000 mg | ORAL_TABLET | Freq: Four times a day (QID) | ORAL | Status: DC | PRN

## 2021-08-05 MED ORDER — CHLORHEXIDINE GLUCONATE CLOTH 2 % EX PADS
6.0000 | MEDICATED_PAD | Freq: Every day | CUTANEOUS | Status: DC
  Administered 2021-08-06 – 2021-08-07 (×2): 6 via TOPICAL

## 2021-08-05 MED ORDER — OMEGA-3-ACID ETHYL ESTERS 1 G PO CAPS
1.0000 g | ORAL_CAPSULE | Freq: Every day | ORAL | Status: DC
  Administered 2021-08-07: 1 g via ORAL
  Filled 2021-08-05 (×2): qty 1

## 2021-08-05 MED ORDER — FAMOTIDINE 20 MG PO TABS
20.0000 mg | ORAL_TABLET | Freq: Every evening | ORAL | Status: DC | PRN
  Administered 2021-08-05 – 2021-08-06 (×2): 20 mg via ORAL
  Filled 2021-08-05 (×2): qty 1

## 2021-08-05 MED ORDER — VANCOMYCIN HCL 1500 MG/300ML IV SOLN
1500.0000 mg | INTRAVENOUS | Status: DC
Start: 2021-08-06 — End: 2021-08-07
  Administered 2021-08-06: 1500 mg via INTRAVENOUS
  Filled 2021-08-05: qty 300

## 2021-08-05 MED ORDER — METRONIDAZOLE 500 MG/100ML IV SOLN
500.0000 mg | Freq: Two times a day (BID) | INTRAVENOUS | Status: DC
  Administered 2021-08-06 – 2021-08-07 (×3): 500 mg via INTRAVENOUS
  Filled 2021-08-05 (×3): qty 100

## 2021-08-05 MED ORDER — LACTATED RINGERS IV SOLN: INTRAVENOUS | Status: AC

## 2021-08-05 MED ORDER — ATORVASTATIN CALCIUM 10 MG PO TABS
10.0000 mg | ORAL_TABLET | Freq: Every day | ORAL | Status: DC
Start: 2021-08-06 — End: 2021-08-06

## 2021-08-05 MED ORDER — LACTATED RINGERS IV BOLUS (SEPSIS)
1000.0000 mL | Freq: Once | INTRAVENOUS | Status: AC
Start: 2021-08-05 — End: 2021-08-05
  Administered 2021-08-05: 1000 mL via INTRAVENOUS

## 2021-08-05 MED ORDER — VANCOMYCIN HCL IN DEXTROSE 1-5 GM/200ML-% IV SOLN
1000.0000 mg | Freq: Once | INTRAVENOUS | Status: AC
  Administered 2021-08-05: 1000 mg via INTRAVENOUS
  Filled 2021-08-05: qty 200

## 2021-08-05 MED ORDER — LACTATED RINGERS IV BOLUS (SEPSIS)
250.0000 mL | Freq: Once | INTRAVENOUS | Status: AC
  Administered 2021-08-05: 250 mL via INTRAVENOUS

## 2021-08-05 MED ORDER — PROCHLORPERAZINE EDISYLATE 10 MG/2ML IJ SOLN
5.0000 mg | Freq: Once | INTRAMUSCULAR | Status: AC
  Administered 2021-08-05: 5 mg via INTRAVENOUS
  Filled 2021-08-05: qty 2

## 2021-08-05 MED ORDER — LIDOCAINE VISCOUS HCL 2 % MT SOLN
15.0000 mL | Freq: Once | OROMUCOSAL | Status: AC
  Administered 2021-08-05: 15 mL via ORAL
  Filled 2021-08-05: qty 15

## 2021-08-05 MED ORDER — CEFEPIME HCL 2 G IV SOLR
2.0000 g | Freq: Once | INTRAVENOUS | Status: AC
  Administered 2021-08-05: 2 g via INTRAVENOUS
  Filled 2021-08-05: qty 12.5

## 2021-08-05 MED ORDER — LACTATED RINGERS IV BOLUS (SEPSIS)
1000.0000 mL | Freq: Once | INTRAVENOUS | Status: AC
  Administered 2021-08-05: 1000 mL via INTRAVENOUS

## 2021-08-05 MED ORDER — ALUM & MAG HYDROXIDE-SIMETH 200-200-20 MG/5ML PO SUSP
30.0000 mL | Freq: Once | ORAL | Status: AC
  Administered 2021-08-05: 30 mL via ORAL
  Filled 2021-08-05: qty 30

## 2021-08-05 MED ORDER — ONDANSETRON HCL 4 MG/2ML IJ SOLN: 4.0000 mg | Freq: Once | INTRAMUSCULAR | Status: DC

## 2021-08-05 MED ORDER — LACTATED RINGERS IV BOLUS
1000.0000 mL | Freq: Once | INTRAVENOUS | Status: AC
  Administered 2021-08-05: 1000 mL via INTRAVENOUS

## 2021-08-05 MED ORDER — METRONIDAZOLE 500 MG/100ML IV SOLN
500.0000 mg | Freq: Once | INTRAVENOUS | Status: AC
Start: 2021-08-05 — End: 2021-08-05
  Administered 2021-08-05: 500 mg via INTRAVENOUS
  Filled 2021-08-05: qty 100

## 2021-08-05 MED ORDER — ORAL CARE MOUTH RINSE
15.0000 mL | Freq: Two times a day (BID) | OROMUCOSAL | Status: DC
  Administered 2021-08-05 – 2021-08-07 (×3): 15 mL via OROMUCOSAL

## 2021-08-05 MED ORDER — VANCOMYCIN HCL 500 MG/100ML IV SOLN
500.0000 mg | Freq: Once | INTRAVENOUS | Status: AC
  Administered 2021-08-05: 500 mg via INTRAVENOUS
  Filled 2021-08-05: qty 100

## 2021-08-05 MED ORDER — MORPHINE SULFATE (PF) 4 MG/ML IV SOLN
4.0000 mg | Freq: Once | INTRAVENOUS | Status: AC
  Administered 2021-08-05: 4 mg via INTRAVENOUS
  Filled 2021-08-05: qty 1

## 2021-08-05 NOTE — H&P (Addendum)
History and Physical    Patient: Casey Pierce. XQJ:[id number] DOB: 07/17/1955 DOA: 08/05/2021 DOS: the patient was seen and examined on 08/06/2021 PCP: Lavone Orn, MD  Patient coming from: Home  Chief Complaint:  Chief Complaint  Patient presents with   Shortness of Breath   Abdominal Pain   HPI: Casey Pierce. is a 66 y.o. male with medical history significant of Hx of CAD with bare metal stent LAD, CVA, permanent atrial fibrillation on Eliquis, pancreatic cancer s/p biliary stent placement (03/2021) on chemotherapy who presents with abdominal pain.  Upper abdominal pain started around 11 this morning. Pain is dull and achy about 2/10. Has felt nausea but no vomiting. No diarrhea. Also noted darker urine and dysuria today.   He was diagnosed with pancreatic head mass causing obstruction of the distal common bile duct in December 2022.  He underwent common bile duct stenting in 03/28/2021 with Dr. Watt Climes of GI.  He has been following with Dr. Barry Dienes and has plans for Whipple procedure in July.  Currently is undergoing cardiac clearance with pending stress test on 5/30.  In the ED, he was febrile up to 102.84F, atrial fibrillation with RVR with rates up to 130 on room air.  WBC of 42.3 K, platelet of 95.  Lactate 3.9.  CMP with mildly elevated lipase of 57, AST of 350, ALT of 514, alkaline phosphatase of 206 with and 6.7 total bilirubin.  CT abdomen/pelvis showed new mild diffuse intrahepatic biliary ductal dilatation with CBD stent in place.  Gallbladder is within normal limits.  Pancreas unremarkable.  Mild diffuse colonic wall thickening worrisome for nonspecific colitis.  CTA chest obtained for tachypnea negative for PE or acute findings.   ED PA discussed with [facility] and they will see in consultation in the morning.  He also discussed case with on-call oncology Dr. Benay Spice who believes his leukocytosis could be a combination of chemo and possible infection.  They agree with  broad-spectrum antibiotics and will follow in consultation. Review of Systems: As mentioned in the history of present illness. All other systems reviewed and are negative. Past Medical History:  Diagnosis Date   Atrial fibrillation ([place])    Coronary artery disease    NSTEMI 12/1998 - LHC:  prox 85-90, RCA prox 30-50, EF 65-70 >> KGY:JEHU and BMS to LAD; large Dx jailed by stent (90%), EF 60  // Myoview 7/18: normal perfusion, Low Risk   History of echocardiogram    Echo 7/18: mild LVH, EF 60-65, no RWMA, mild MR, mod LAE   Myocardial infarction [facility])    Pancreatic cancer ([place])    Stroke ([place]) 04/2014   Past Surgical History:  Procedure Laterality Date   BILIARY STENT PLACEMENT N/A 03/28/2021   Procedure: BILIARY STENT PLACEMENT;  Surgeon: Clarene Essex, MD;  Location: [facility];  Service: Endoscopy;  Laterality: N/A;   CARDIOVERSION N/A 06/20/2015   Procedure: CARDIOVERSION;  Surgeon: [name], MD;  Location: [facility];  Service: Cardiovascular;  Laterality: N/A;   COLONOSCOPY WITH PROPOFOL N/A 02/07/2015   Procedure: COLONOSCOPY WITH PROPOFOL;  Surgeon: [name], MD;  Location: [facility];  Service: Endoscopy;  Laterality: N/A;   CORONARY STENT PLACEMENT  2001   ERCP N/A 03/28/2021   Procedure: ENDOSCOPIC RETROGRADE CHOLANGIOPANCREATOGRAPHY (ERCP);  Surgeon: Clarene Essex, MD;  Location: [facility];  Service: Endoscopy;  Laterality: N/A;   ESOPHAGOGASTRODUODENOSCOPY N/A 03/28/2021   Procedure: ESOPHAGOGASTRODUODENOSCOPY (EGD);  Surgeon: Arta Silence, MD;  Location: [facility];  Service:  Gastroenterology;  Laterality: N/A;   EUS N/A 03/28/2021   Procedure: FULL UPPER ENDOSCOPIC ULTRASOUND (EUS) RADIAL;  Surgeon: Arta Silence, MD;  Location: [facility];  Service: Gastroenterology;  Laterality: N/A;   FINE NEEDLE ASPIRATION N/A 03/28/2021   Procedure: FINE NEEDLE ASPIRATION (FNA) LINEAR;  Surgeon: Arta Silence, MD;  Location: [facility];  Service:  Gastroenterology;  Laterality: N/A;   KNEE SURGERY  1972   PORTACATH PLACEMENT N/A 04/18/2021   Procedure: PORT PLACEMENT;  Surgeon: Stark Klein, MD;  Location: [facility];  Service: General;  Laterality: N/A;   SPHINCTEROTOMY  03/28/2021   Procedure: SPHINCTEROTOMY;  Surgeon: Clarene Essex, MD;  Location: [facility];  Service: Endoscopy;;   Social History:  reports that he has never smoked. He quit smokeless tobacco use about 22 years ago.  His smokeless tobacco use included chew. He reports that he does not drink alcohol and does not use drugs.  No Known Allergies  Family History  Problem Relation Age of Onset   Cancer Father 62       tongue/neck   Heart attack Maternal Uncle    Cancer Paternal Uncle        unk type    Prior to Admission medications   Medication Sig Start Date End Date Taking? Authorizing Provider  apixaban (ELIQUIS) 5 MG TABS tablet Take 1 tablet (5 mg total) by mouth 2 (two) times daily. 05/23/21  Yes Deboraha Sprang, MD  atorvastatin (LIPITOR) 10 MG tablet Take 10 mg by mouth daily.   Yes [provider]  famotidine (PEPCID) 20 MG tablet Take 20 mg by mouth at bedtime as needed for heartburn or indigestion.   Yes [provider]  lidocaine-prilocaine (EMLA) cream Apply to affected area once Patient taking differently: Apply 1 application. topically daily as needed. Apply to affected area 04/11/21  Yes Truitt Merle, MD  loratadine (CLARITIN) 10 MG tablet Take 10 mg by mouth daily as needed for allergies (Take after Chemo treatments).   Yes [provider]  Multiple Vitamin (MULTI-VITAMIN DAILY PO) Take 1 tablet by mouth daily.   Yes [provider]  Omega-3 Fatty Acids (FISH OIL PO) Take 1 tablet by mouth daily.   Yes [provider]  prochlorperazine (COMPAZINE) 10 MG tablet Take 1 tablet (10 mg total) by mouth every 6 (six) hours as needed (Nausea or vomiting). Patient taking differently: Take 10 mg by mouth  every 6 (six) hours as needed for nausea or vomiting. 07/12/21  Yes Truitt Merle, MD  ondansetron (ZOFRAN) 8 MG tablet Take 1 tablet (8 mg total) by mouth 2 (two) times daily as needed. Start on day 3 after chemotherapy. Patient not taking: Reported on 08/05/2021 07/12/21   Truitt Merle, MD    Physical Exam: Vitals:   08/05/21 2220 08/05/21 2225 08/05/21 2230 08/05/21 2235  BP: (!) 95/55     Pulse: 94 (!) 112 (!) 115 96  Resp: (!) 27 (!) 25 (!) 23 (!) 26  Temp:      TempSrc:      SpO2: 98% 97% 99% 97%   Constitutional: NAD, calm, comfortable, nontoxic well-appearing elderly gentleman lying flat in bed Eyes:  lids and conjunctivae normal ENMT: Mucous membranes are moist.  Neck: normal, supple Respiratory: clear to auscultation bilaterally, no wheezing, no crackles. Normal respiratory effort. No accessory muscle use.  Cardiovascular: Irregularly irregular rate and rhythm, no murmurs / rubs / gallops. No extremity edema. Abdomen: Soft, nondistended, no tenderness, no guarding, rebound tenderness or  rigidity, no masses palpated.Bowel sounds positive.  Musculoskeletal: no clubbing / cyanosis. No joint deformity upper and lower extremities. Good ROM, no contractures. Normal muscle tone.  Skin: no rashes, lesions, ulcers.  Neurologic: CN 2-12 grossly intact. Strength 5/5 in all 4.  Psychiatric: Normal judgment and insight. Alert and oriented x 3. Normal mood. Data Reviewed:  See HPI  Assessment and Plan: * Sepsis ([place]) Presented with fever, tachycardia and tachypnea -CT A/P showed new mild diffuse intrahepatic biliary ductal dilatation with CBD stent in place.  Gallbladder is within normal limits.  Pancreas unremarkable.  Mild diffuse colonic wall thickening worrisome for nonspecific colitis. -also mentioned dysuria-UA with culture is pending -continue broad spectrum antibiotics with IV vancomycin, flagyl and cefepime. Blood cultures pending.  -continue IV fluid  -trend lactic   Pancreatic  cancer ([place]) New intrahepatic ductal diltation s/p biliary stent in 03/2021 -CMP with mildly elevated lipase of 57, AST of 350, ALT of 514, alkaline phosphatase of 206 with and 6.7 total bilirubin. -He was diagnosed with pancreatic head mass causing obstruction of the distal common bile duct in December 2022.  He underwent common bile duct stenting in 03/28/2021 with Dr. Watt Climes of GI.  He has been following with Dr. Barry Dienes and has plans for Whipple procedure in July.  Currently is undergoing cardiac clearance with pending stress test on 5/30. -Follows with Dr. Burr Medico with oncology- last infusion of Pegfilgrastrim 5/19 which can also contribute to his leukocytosis  -[facility] will consult in the morning. He has seen Dr. Watt Climes for stenting.   Permanent atrial fibrillation ([facility]) Initially presented with RVR with rates up to 140 but improving with fluid -keep on continuous telemetry  -holding overnight Eliquis pending GI consult in the morning.  He has been permitted by outpatient cardiology to hold anticoagulation for interventions without bridging.  Last dose this morning (5/20)   Coronary artery disease involving native coronary artery of native heart with angina pectoris ([place]) Asymptomatic.  Hold statin for now with elevated LFTs  History of stroke Will hold evening dose of Eliquis in anticipation of GI intervention tomorrow      Advance Care Planning:   Code Status: Full Code   Consults: GI and oncology   Family Communication: Discussed with wife and daughter who is an Therapist, sports at bedside  Severity of Illness: The appropriate patient status for this patient is INPATIENT. Inpatient status is judged to be reasonable and necessary in order to provide the required intensity of service to ensure the patient's safety. The patient's presenting symptoms, physical exam findings, and initial radiographic and laboratory data in the context of their chronic comorbidities is felt to place them at high risk for  further clinical deterioration. Furthermore, it is not anticipated that the patient will be medically stable for discharge from the hospital within 2 midnights of admission.   * I certify that at the point of admission it is my clinical judgment that the patient will require inpatient hospital care spanning beyond 2 midnights from the point of admission due to high intensity of service, high risk for further deterioration and high frequency of surveillance required.*  Author: Orene Desanctis, DO 08/06/2021 1:50 AM  For on call review [email or web address].

## 2021-08-05 NOTE — Progress Notes (Signed)
Pharmacy Antibiotic Note  Casey Pierce. is a 66 y.o. male admitted on 08/05/2021 with sepsis.  Pharmacy has been consulted for vanc/cefepime dosing.  Plan: Vanc 1g ordered x 1 already, will order another '500mg'$  to give '1500mg'$  total for 1st dose. Start '1500mg'$  IV q24 thereafter. Goal AUC 400-550 Cefepime 2g IV q8     Temp (24hrs), Avg:100.4 F (38 C), Min:98.1 F (36.7 C), Max:102.6 F (39.2 C)  Recent Labs  Lab 08/02/21 0746 08/05/21 1409 08/05/21 1526 08/05/21 1749  WBC 8.7 42.3*  --   --   CREATININE 0.57* 0.54*  --   --   LATICACIDVEN  --   --  3.9* 5.1*    Estimated Creatinine Clearance: 77.9 mL/min (A) (by C-G formula based on SCr of 0.54 mg/dL (L)).    No Known Allergies    Thank you for allowing pharmacy to be a part of this patient's care.  Kara Mead 08/05/2021 8:19 PM

## 2021-08-05 NOTE — Assessment & Plan Note (Addendum)
-  Eliquis initially held for ERCP this visit -Pt cleared to resume Eliquis by GI on day of d/c

## 2021-08-05 NOTE — Progress Notes (Signed)
A consult was received from an ED physician for vancomycin and cefepime per pharmacy dosing (for an indication other than meningitis). The patient's profile has been reviewed for ht/wt/allergies/indication/available labs. A one time order has been placed for the above antibiotics.  Further antibiotics/pharmacy consults should be ordered by admitting physician if indicated.                       Reuel Boom, PharmD, BCPS [phone] 08/05/2021, 4:15 PM

## 2021-08-05 NOTE — Assessment & Plan Note (Addendum)
New intrahepatic ductal diltation s/p biliary stent in 03/2021 -CMP with mildly elevated lipase of 57, AST of 350, ALT of 514, alkaline phosphatase of 206 with and 6.7 total bilirubin. -He was diagnosed with pancreatic head mass causing obstruction of the distal common bile duct in December 2022.  He underwent common bile duct stenting in 03/28/2021 with Dr. Watt Climes of GI.  He has been following with Dr. Barry Dienes and has plans for Whipple procedure in July.  Currently is undergoing cardiac clearance with pending stress test on 5/30. -Follows with Dr. Burr Medico with oncology- last infusion of Pegfilgrastrim 5/19, likely resulted in current leukocytosis  -[facility] following with results noted. Pt now s/p ERCP and biliary stent placement

## 2021-08-05 NOTE — Assessment & Plan Note (Addendum)
Asymptomatic.  Hold statin for now with elevated LFTs

## 2021-08-05 NOTE — ED Provider Notes (Signed)
[facility] DEPT Provider Note   CSN: [id number] Arrival date & time: 08/05/21  1333     History  Chief Complaint  Patient presents with   Shortness of Breath   Abdominal Pain    Casey Pierce. is a 66 y.o. male with history of CAD, stroke, pancreatic cancer currently on chemotherapy with last treatment yesterday, MI, A-fib on Eliquis who presents to the ED for sudden onset epigastric pain that occurred after eating breakfast this morning.  Patient states that he had a breakfast of eggs, bacon and sausage and shortly after developed pain in a bandlike pattern across his epigastric region.  He describes that as "like a toothache" and is constant.  He notes that this happened to him 1 time before at night, but it just resolved on its own and he did not require any treatment.  Patient denies shortness of breath, although he notes that deep breaths do aggravate his pain.  He denies chest pain, headache, nausea, vomiting and reports normal bowel movement earlier today.  Took his last dose of Eliquis this morning.  Shortness of Breath Associated symptoms: abdominal pain   Abdominal Pain Associated symptoms: shortness of breath       Home Medications Prior to Admission medications   Medication Sig Start Date End Date Taking? Authorizing Provider  apixaban (ELIQUIS) 5 MG TABS tablet Take 1 tablet (5 mg total) by mouth 2 (two) times daily. 05/23/21   Deboraha Sprang, MD  atorvastatin (LIPITOR) 10 MG tablet 1 tablet    [provider]  famotidine (PEPCID) 20 MG tablet 1 tablet at bedtime as needed    [provider]  lidocaine-prilocaine (EMLA) cream Apply to affected area once 04/11/21   Truitt Merle, MD  Multiple Vitamin (MULTI-VITAMIN DAILY PO) Take by mouth daily.    [provider]  Omega-3 Fatty Acids (FISH OIL PO) Take by mouth daily.    [provider]  ondansetron (ZOFRAN) 8 MG tablet Take 1 tablet (8 mg total) by mouth  2 (two) times daily as needed. Start on day 3 after chemotherapy. 07/12/21   Truitt Merle, MD  prochlorperazine (COMPAZINE) 10 MG tablet Take 1 tablet (10 mg total) by mouth every 6 (six) hours as needed (Nausea or vomiting). 07/12/21   Truitt Merle, MD      Allergies    Patient has no known allergies.    Review of Systems   Review of Systems  Respiratory:  Positive for shortness of breath.   Gastrointestinal:  Positive for abdominal pain.   Physical Exam Updated Vital Signs BP 139/81 (BP Location: Left Arm)   Pulse (!) 138   Temp 98.1 F (36.7 C) (Oral)   Resp (!) 38   SpO2 97%  Physical Exam Vitals and nursing note reviewed.  Constitutional:      General: He is in acute distress.     Appearance: He is ill-appearing.     Comments: Patient is curled up on his side, trembling with short clipped breathing secondary to pain  HENT:     Head: Atraumatic.  Eyes:     Conjunctiva/sclera: Conjunctivae normal.  Cardiovascular:     Rate and Rhythm: Tachycardia present. Rhythm irregular.     Pulses: Normal pulses.     Heart sounds: No murmur heard. Pulmonary:     Effort: Pulmonary effort is normal. Tachypnea present. No respiratory distress.     Breath sounds: Normal breath sounds. No rhonchi or rales.  Chest:  Chest wall: No tenderness.  Abdominal:     General: Abdomen is flat. There is no distension.     Tenderness: There is no right CVA tenderness or left CVA tenderness.     Comments: Abdomen nondistended, firm and is overall nontender to palpation, reporting that the pain feels "deeper" than that and not aggravated with touch.  Negative Murphy sign, negative McBurney.  Musculoskeletal:        General: Normal range of motion.     Cervical back: Normal range of motion.  Skin:    General: Skin is warm and dry.     Capillary Refill: Capillary refill takes less than 2 seconds.  Neurological:     General: No focal deficit present.     Mental Status: He is alert.  Psychiatric:         Mood and Affect: Mood normal.    ED Results / Procedures / Treatments   Labs (all labs ordered are listed, but only abnormal results are displayed) Labs Reviewed  COMPREHENSIVE METABOLIC PANEL  CBC WITH DIFFERENTIAL/PLATELET  LIPASE, BLOOD    EKG None  Radiology No results found.  Procedures Procedures    Medications Ordered in ED Medications  lactated ringers bolus 1,000 mL (1,000 mLs Intravenous New Bag/Given 08/05/21 1410)  alum & mag hydroxide-simeth (MAALOX/MYLANTA) 200-200-20 MG/5ML suspension 30 mL (30 mLs Oral Given 08/05/21 1409)    And  lidocaine (XYLOCAINE) 2 % viscous mouth solution 15 mL (15 mLs Oral Given 08/05/21 1409)  morphine (PF) 4 MG/ML injection 4 mg (4 mg Intravenous Given 08/05/21 1409)    ED Course/ Medical Decision Making/ A&P                           Medical Decision Making Amount and/or Complexity of Data Reviewed Labs: ordered. Radiology: ordered.  Risk OTC drugs. Prescription drug management.   Social determinants of health:  Social History   Socioeconomic History   Marital status: Married    Spouse name: Patty   Number of children: 3   Years of education: Associate   Highest education level: Not on file  Occupational History   Occupation: [employer]   Tobacco Use   Smoking status: Never   Smokeless tobacco: Former    Types: Chew    Quit date: 03/20/1999   Tobacco comments:    quit chewing 7 years ago  Vaping Use   Vaping Use: Never used  Substance and Sexual Activity   Alcohol use: No    Alcohol/week: 0.0 standard drinks   Drug use: No   Sexual activity: Not on file  Other Topics Concern   Not on file  Social History Narrative   Lives at home with wife.    Caffeine: Drinks coffee- about 3 cups per day   Social Determinants of Health   Financial Resource Strain: Not on file  Food Insecurity: Not on file  Transportation Needs: Not on file  Physical Activity: Not on file  Stress: Not on file  Social  Connections: Not on file  Intimate Partner Violence: Not on file     Initial impression:  This patient presents to the ED for concern of sudden onset abdominal pain, this involves an extensive number of treatment options, and is a complaint that carries with it a high risk of complications and morbidity.   Differentials include complications from pancreatic cancer, pancreatitis, cholecystitis, chemo-induced gastritis, dissection, ACS  Comorbidities affecting care:  Pancreatic  cancer currently undergoing treatment, A-fib  Additional history obtained: Oncology records, previous labs  Lab Tests  I Ordered, reviewed, and interpreted labs and EKG.  The pertinent results include:  Pending Trop, lactic, CBC, CMP and lipase  Imaging Studies ordered:  I ordered imaging studies including  Pending CT abdomen, CTA PE I independently visualized and interpreted imaging and I agree with the radiologist interpretation.   EKG: Afib with RVR  Cardiac Monitoring:  The patient was maintained on a cardiac monitor.  I personally viewed and interpreted the cardiac monitored which showed an underlying rhythm of: afib with RVR   Medicines ordered and prescription drug management:  I ordered medication including: LR bolus 1 L GI cocktail Morphine 4 mg I have reviewed the patients home medicines and have made adjustments as needed   ED Course/Re-evaluation: 66 year old male acutely distressed presents to the ED for evaluation of sudden onset epigastric pain described as aching.  On exam, vitals show tachycardia up to 130s, tachypnea into the 30s as well.  Oxygen at 96% on room air.  Blood pressure normal.  Patient is clearly in significant pain.  He notes pain in a bandlike pattern across his epigastric region, however the pain is not reproducible with palpation.  Symptoms not consistent with dissection.  EKG noted to be in A-fib with RVR.  Given degree of patient's pain, will hold off on rate  limiting drugs until patient has pain control and IV fluids.  Patient given morphine, 1 L fluids along with GI cocktail for potential gastric etiology.  I ordered labs and CT abdomen pelvis and CTA of chest.   Patient care handed off to [employer], [place]. Plan is pending all labs and imaging at this time with plan to be determined when these return. I appreciate his assistance in managing the care of this patient.   Final Clinical Impression(s) / ED Diagnoses Final diagnoses:  None    Rx / DC Orders ED Discharge Orders     None         Rodena Piety 08/05/21 1507    Davonna Belling, MD 08/05/21 [phone]

## 2021-08-05 NOTE — ED Provider Notes (Signed)
Signout received on this 66 year old male who has history of pancreatic cancer undergoing chemo therapy.  Most recent treatment yesterday.  Patient has had acute onset abdominal pain today which is in a bandlike distribution over his upper abdomen.  Also found to be in A-fib RVR on arrival.  Not currently on rate control.  States he has been off of metoprolol for about a year.  He is in permanent A-fib on Eliquis with his last dose being this morning.  At time of signout patient is awaiting most of his work-up including lab results, imaging including CT abdomen pelvis, and CT PE study. Patient is febrile, tachycardic, tachypneic.  Concern for sepsis.  Sepsis protocol initiated.  Broad-spectrum antibiotics and large volume resuscitation initiated.  Physical Exam  BP 114/66   Pulse (!) 141   Temp (!) 102.6 F (39.2 C) (Rectal)   Resp (!) 32   SpO2 96%     Procedures  .Critical Care Performed by: Evlyn Courier, PA-C Authorized by: Evlyn Courier, PA-C   Critical care provider statement:    Critical care time (minutes):  30   Critical care was necessary to treat or prevent imminent or life-threatening deterioration of the following conditions:  Sepsis   Critical care was time spent personally by me on the following activities:  Development of treatment plan with patient or surrogate, discussions with consultants, evaluation of patient's response to treatment, examination of patient, ordering and review of laboratory studies, ordering and review of radiographic studies, ordering and performing treatments and interventions, pulse oximetry, re-evaluation of patient's condition and review of old charts  ED Course / MDM    Medical Decision Making Amount and/or Complexity of Data Reviewed Labs: ordered. Radiology: ordered. ECG/medicine tests: ordered.  Risk OTC drugs. Prescription drug management.   CT PE study negative for PE.  CT abdomen pelvis with evidence of biliary dilatation.  Concern for  choledocholithiasis.  Discussed with gastroenterologist who agree with broad-spectrum antibiotics.  They will determine their next step in management.  They recommend medicine admission.  Discussed with oncologist who is in agreement with treating patient as sepsis even though patient had fillgastrim which can result in leukocytosis, and fever.  Leukocytosis of 42.6.  Most recently without leukocytosis yesterday with 8.7.  You are likely driven by sepsis.  Lactic acidosis present.  Continue large volume resuscitation.  Discussed with hospitalist will evaluate patient for admission.  Discussed with hospitalist will evaluate patient for admission.       Evlyn Courier, PA-C 08/05/21 1839    Teressa Lower, MD 08/06/21 1407

## 2021-08-05 NOTE — ED Triage Notes (Signed)
Patient presents from home due to shortness of breath, tremors and 2/10 dull upper abdominal pain. He is currently receiving chemo (Wednesday, Thursday, Friday) for pancreatic cancer. EMS administered a 5 mg Albuterol treatment.     EMS vitals: 122/70 BP 122 HR A Fib 24 RR 94% SPO2 on room air 222 CBG 98.7 Temp

## 2021-08-05 NOTE — Assessment & Plan Note (Addendum)
Initially presented with RVR with rates up to 140 but improving with fluid -HR remained stable through remainder of visit -Eliquis initially held for procedure, resumed on day of d/c

## 2021-08-05 NOTE — Assessment & Plan Note (Addendum)
Presented with fever, tachycardia and tachypnea -CT A/P showed new mild diffuse intrahepatic biliary ductal dilatation with CBD stent in place.  Gallbladder is within normal limits.  Pancreas unremarkable.  Mild diffuse colonic wall thickening worrisome for nonspecific colitis. -continued broad spectrum antibiotics with IV vancomycin, flagyl and cefepime.  -blood cultures 1/4 staph species, likely contaminant -lactate trending down -Discharged on augmentin to complete 5 days of abx

## 2021-08-06 ENCOUNTER — Encounter (HOSPITAL_COMMUNITY)
Admission: EM | Disposition: A | Discharge status: Home / Self Care | Payer: Self-pay | Source: Home / Self Care | Attending: Internal Medicine

## 2021-08-06 ENCOUNTER — Inpatient Hospital Stay (HOSPITAL_COMMUNITY): Payer: Medicare PPO

## 2021-08-06 ENCOUNTER — Encounter (HOSPITAL_COMMUNITY): Payer: Self-pay | Admitting: Family Medicine

## 2021-08-06 ENCOUNTER — Inpatient Hospital Stay (HOSPITAL_COMMUNITY): Payer: Medicare PPO | Admitting: Anesthesiology

## 2021-08-06 DIAGNOSIS — K831 Obstruction of bile duct: Secondary | ICD-10-CM

## 2021-08-06 DIAGNOSIS — C250 Malignant neoplasm of head of pancreas: Secondary | ICD-10-CM

## 2021-08-06 DIAGNOSIS — Z4659 Encounter for fitting and adjustment of other gastrointestinal appliance and device: Secondary | ICD-10-CM | POA: Diagnosis not present

## 2021-08-06 DIAGNOSIS — I25119 Atherosclerotic heart disease of native coronary artery with unspecified angina pectoris: Secondary | ICD-10-CM | POA: Diagnosis not present

## 2021-08-06 DIAGNOSIS — I2510 Atherosclerotic heart disease of native coronary artery without angina pectoris: Secondary | ICD-10-CM

## 2021-08-06 DIAGNOSIS — T85590A Other mechanical complication of bile duct prosthesis, initial encounter: Secondary | ICD-10-CM

## 2021-08-06 DIAGNOSIS — Z8673 Personal history of transient ischemic attack (TIA), and cerebral infarction without residual deficits: Secondary | ICD-10-CM | POA: Diagnosis not present

## 2021-08-06 DIAGNOSIS — I252 Old myocardial infarction: Secondary | ICD-10-CM

## 2021-08-06 HISTORY — PX: BILIARY STENT PLACEMENT: SHX5538

## 2021-08-06 HISTORY — PX: ERCP: SHX5425

## 2021-08-06 LAB — CBC
HCT: 32.3 % — ABNORMAL LOW (ref 39.0–52.0)
Hemoglobin: 10.7 g/dL — ABNORMAL LOW (ref 13.0–17.0)
MCH: 34.4 pg — ABNORMAL HIGH (ref 26.0–34.0)
MCHC: 33.1 g/dL (ref 30.0–36.0)
MCV: 103.9 fL — ABNORMAL HIGH (ref 80.0–100.0)
Platelets: 64 10*3/uL — ABNORMAL LOW (ref 150–400)
RBC: 3.11 MIL/uL — ABNORMAL LOW (ref 4.22–5.81)
RDW: 17.4 % — ABNORMAL HIGH (ref 11.5–15.5)
WBC: 48.2 10*3/uL — ABNORMAL HIGH (ref 4.0–10.5)
nRBC: 0 % (ref 0.0–0.2)

## 2021-08-06 LAB — COMPREHENSIVE METABOLIC PANEL
ALT: 327 U/L — ABNORMAL HIGH (ref 0–44)
AST: 213 U/L — ABNORMAL HIGH (ref 15–41)
Albumin: 2.5 g/dL — ABNORMAL LOW (ref 3.5–5.0)
Alkaline Phosphatase: 129 U/L — ABNORMAL HIGH (ref 38–126)
Anion gap: 10 (ref 5–15)
BUN: 13 mg/dL (ref 8–23)
CO2: 23 mmol/L (ref 22–32)
Calcium: 8.4 mg/dL — ABNORMAL LOW (ref 8.9–10.3)
Chloride: 103 mmol/L (ref 98–111)
Creatinine, Ser: 0.5 mg/dL — ABNORMAL LOW (ref 0.61–1.24)
GFR, Estimated: >60 mL/min (ref >60)
Glucose, Bld: 133 mg/dL — ABNORMAL HIGH (ref 70–99)
Potassium: 3.5 mmol/L (ref 3.5–5.1)
Sodium: 136 mmol/L (ref 135–145)
Total Bilirubin: 6.6 mg/dL — ABNORMAL HIGH (ref 0.3–1.2)
Total Protein: 5.4 g/dL — ABNORMAL LOW (ref 6.5–8.1)

## 2021-08-06 LAB — URINE CULTURE: Culture: NO GROWTH

## 2021-08-06 LAB — MRSA NEXT GEN BY PCR, NASAL: MRSA by PCR Next Gen: NOT DETECTED

## 2021-08-06 SURGERY — ERCP, WITH INTERVENTION IF INDICATED
Anesthesia: General

## 2021-08-06 MED ORDER — ONDANSETRON HCL 4 MG/2ML IJ SOLN
INTRAMUSCULAR | Status: DC | PRN
  Administered 2021-08-06: 4 mg via INTRAVENOUS

## 2021-08-06 MED ORDER — SODIUM CHLORIDE 0.9 % IV SOLN
INTRAVENOUS | Status: DC | PRN
  Administered 2021-08-06: 25 mL

## 2021-08-06 MED ORDER — DICLOFENAC SUPPOSITORY 100 MG
RECTAL | Status: AC
  Filled 2021-08-06: qty 1

## 2021-08-06 MED ORDER — SUGAMMADEX SODIUM 200 MG/2ML IV SOLN
INTRAVENOUS | Status: DC | PRN
Start: 2021-08-06 — End: 2021-08-06
  Administered 2021-08-06: 150 mg via INTRAVENOUS

## 2021-08-06 MED ORDER — DICLOFENAC SUPPOSITORY 100 MG
100.0000 mg | Freq: Once | RECTAL | Status: DC
  Filled 2021-08-06: qty 1

## 2021-08-06 MED ORDER — FENTANYL CITRATE (PF) 100 MCG/2ML IJ SOLN
INTRAMUSCULAR | Status: AC
  Filled 2021-08-06: qty 2

## 2021-08-06 MED ORDER — PROPOFOL 10 MG/ML IV BOLUS
INTRAVENOUS | Status: AC
  Filled 2021-08-06: qty 20

## 2021-08-06 MED ORDER — PHENYLEPHRINE 80 MCG/ML (10ML) SYRINGE FOR IV PUSH (FOR BLOOD PRESSURE SUPPORT)
PREFILLED_SYRINGE | INTRAVENOUS | Status: DC | PRN
  Administered 2021-08-06: 80 ug via INTRAVENOUS

## 2021-08-06 MED ORDER — PHENYLEPHRINE HCL-NACL 20-0.9 MG/250ML-% IV SOLN
INTRAVENOUS | Status: DC | PRN
  Administered 2021-08-06: 30 ug/min via INTRAVENOUS

## 2021-08-06 MED ORDER — ROCURONIUM BROMIDE 10 MG/ML (PF) SYRINGE
PREFILLED_SYRINGE | INTRAVENOUS | Status: DC | PRN
  Administered 2021-08-06: 10 mg via INTRAVENOUS
  Administered 2021-08-06: 40 mg via INTRAVENOUS

## 2021-08-06 MED ORDER — FENTANYL CITRATE (PF) 100 MCG/2ML IJ SOLN
INTRAMUSCULAR | Status: DC | PRN
  Administered 2021-08-06 (×2): 50 ug via INTRAVENOUS

## 2021-08-06 MED ORDER — PROCHLORPERAZINE EDISYLATE 10 MG/2ML IJ SOLN
10.0000 mg | Freq: Four times a day (QID) | INTRAMUSCULAR | Status: DC | PRN
  Administered 2021-08-06 – 2021-08-07 (×3): 10 mg via INTRAVENOUS
  Filled 2021-08-06 (×3): qty 2

## 2021-08-06 MED ORDER — LIDOCAINE 2% (20 MG/ML) 5 ML SYRINGE
INTRAMUSCULAR | Status: DC | PRN
  Administered 2021-08-06: 60 mg via INTRAVENOUS

## 2021-08-06 MED ORDER — PHENYLEPHRINE HCL (PRESSORS) 10 MG/ML IV SOLN
INTRAVENOUS | Status: AC
  Filled 2021-08-06: qty 1

## 2021-08-06 MED ORDER — SODIUM CHLORIDE 0.9 % IV SOLN: INTRAVENOUS | Status: DC

## 2021-08-06 MED ORDER — PROPOFOL 10 MG/ML IV BOLUS
INTRAVENOUS | Status: DC | PRN
  Administered 2021-08-06: 130 mg via INTRAVENOUS

## 2021-08-06 MED ORDER — MIDAZOLAM HCL 2 MG/2ML IJ SOLN
INTRAMUSCULAR | Status: AC
  Filled 2021-08-06: qty 2

## 2021-08-06 MED ORDER — GLUCAGON HCL RDNA (DIAGNOSTIC) 1 MG IJ SOLR
INTRAMUSCULAR | Status: AC
  Filled 2021-08-06: qty 1

## 2021-08-06 MED ORDER — MIDAZOLAM HCL 2 MG/2ML IJ SOLN
INTRAMUSCULAR | Status: DC | PRN
  Administered 2021-08-06: 2 mg via INTRAVENOUS

## 2021-08-06 MED ORDER — DEXAMETHASONE SODIUM PHOSPHATE 10 MG/ML IJ SOLN
INTRAMUSCULAR | Status: DC | PRN
  Administered 2021-08-06: 10 mg via INTRAVENOUS

## 2021-08-06 MED ORDER — LACTATED RINGERS IV SOLN
INTRAVENOUS | Status: AC | PRN
  Administered 2021-08-06: 20 mL/h via INTRAVENOUS

## 2021-08-06 NOTE — Anesthesia Postprocedure Evaluation (Signed)
Anesthesia Post Note  Patient: Casey Pierce.  Procedure(s) Performed: ENDOSCOPIC RETROGRADE CHOLANGIOPANCREATOGRAPHY (ERCP)     Patient location during evaluation: PACU Anesthesia Type: General Level of consciousness: awake and alert Pain management: pain level controlled Vital Signs Assessment: post-procedure vital signs reviewed and stable Respiratory status: spontaneous breathing, nonlabored ventilation, respiratory function stable and patient connected to nasal cannula oxygen Cardiovascular status: blood pressure returned to baseline and stable Postop Assessment: no apparent nausea or vomiting Anesthetic complications: no   No notable events documented.  Last Vitals:  Vitals:   08/06/21 1200 08/06/21 1210  BP: 95/75 91/71  Pulse: 88 89  Resp: (!) 21 (!) 21  Temp:    SpO2: 94% 94%    Last Pain:  Vitals:   08/06/21 1145  TempSrc: Axillary  PainSc:                  [name]

## 2021-08-06 NOTE — Anesthesia Procedure Notes (Signed)
Procedure Name: Intubation Date/Time: 08/06/2021 10:50 AM Performed by: Sharlette Dense, CRNA Pre-anesthesia Checklist: Patient identified, Emergency Drugs available, Suction available and Patient being monitored Patient Re-evaluated:Patient Re-evaluated prior to induction Oxygen Delivery Method: Circle system utilized Preoxygenation: Pre-oxygenation with 100% oxygen Induction Type: IV induction Ventilation: Mask ventilation without difficulty Laryngoscope Size: Miller and 3 Grade View: Grade I Tube type: Oral Tube size: 7.5 mm Number of attempts: 1 Airway Equipment and Method: Stylet Placement Confirmation: ETT inserted through vocal cords under direct vision, positive ETCO2 and breath sounds checked- equal and bilateral Secured at: 22 cm Tube secured with: Tape Dental Injury: Teeth and Oropharynx as per pre-operative assessment

## 2021-08-06 NOTE — Assessment & Plan Note (Addendum)
-  now s/p ERCP with stent placement 5/21

## 2021-08-06 NOTE — Interval H&P Note (Signed)
History and Physical Interval Note:  08/06/2021 10:43 AM  Casey Pierce.  has presented today for surgery, with the diagnosis of cholangitis.  The various methods of treatment have been discussed with the patient and family. After consideration of risks, benefits and other options for treatment, the patient has consented to  Procedure(s): ENDOSCOPIC RETROGRADE CHOLANGIOPANCREATOGRAPHY (ERCP) (N/A) as a surgical intervention.  The patient's history has been reviewed, patient examined, no change in status, stable for surgery.  I have reviewed the patient's chart and labs.  Questions were answered to the patient's satisfaction.     Silvano Rusk

## 2021-08-06 NOTE — Anesthesia Preprocedure Evaluation (Signed)
Anesthesia Evaluation  Patient identified by MRN, date of birth, ID band Patient awake    Reviewed: Allergy & Precautions, NPO status , Patient's Chart, lab work & pertinent test results  Airway Mallampati: II  TM Distance: >3 FB Neck ROM: Full    Dental  (+) Dental Advisory Given   Pulmonary neg pulmonary ROS,    breath sounds clear to auscultation       Cardiovascular + CAD, + Past MI and + Cardiac Stents  + dysrhythmias Atrial Fibrillation  Rhythm:Regular Rate:Normal     Neuro/Psych TIACVA    GI/Hepatic Neg liver ROS, Pancreatic CA   Endo/Other  negative endocrine ROS  Renal/GU negative Renal ROS     Musculoskeletal   Abdominal   Peds  Hematology  (+) Blood dyscrasia (Thrombocytopenia), anemia ,   Anesthesia Other Findings   Reproductive/Obstetrics                             Lab Results  Component Value Date   WBC 48.2 (H) 08/06/2021   HGB 10.7 (L) 08/06/2021   HCT 32.3 (L) 08/06/2021   MCV 103.9 (H) 08/06/2021   PLT 64 (L) 08/06/2021   Lab Results  Component Value Date   CREATININE 0.50 (L) 08/06/2021   BUN 13 08/06/2021   NA 136 08/06/2021   K 3.5 08/06/2021   CL 103 08/06/2021   CO2 23 08/06/2021    Anesthesia Physical Anesthesia Plan  ASA: 3  Anesthesia Plan: General   Post-op Pain Management: Minimal or no pain anticipated   Induction: Intravenous  PONV Risk Score and Plan: 2 and Dexamethasone and Ondansetron  Airway Management Planned: Oral ETT  Additional Equipment: None  Intra-op Plan:   Post-operative Plan: Extubation in OR  Informed Consent: I have reviewed the patients History and Physical, chart, labs and discussed the procedure including the risks, benefits and alternatives for the proposed anesthesia with the patient or authorized representative who has indicated his/her understanding and acceptance.     Dental advisory given  Plan  Discussed with: CRNA  Anesthesia Plan Comments:         Anesthesia Quick Evaluation

## 2021-08-06 NOTE — H&P (View-Only) (Signed)
[facility] Consultation Note  Referring Provider: [facility] Primary Care Physician:  Lavone Orn, MD Primary Gastroenterologist:  Dr. Watt Climes  Reason for Consultation:  elevated LFTs  HPI: Casey Pierce. is a 66 y.o. male history pancreatic cancer with uncovered biliary stent placed January 2023.  Just completed course of chemotherapy couple days ago; plan for [place] in July.  He is on apixaban for atrial fibrillation and CAD, last dose yesterday (?).  Has had one day history of dark urine, chills and upper abdominal pain.  Found temp 102.6 and elevated LFTs with CT scan showing intra and extrahepatic biliary ductal dilatation.  Feels better since admission and antibiotics.   Past Medical History:  Diagnosis Date   Atrial fibrillation ([place])    Coronary artery disease    NSTEMI 12/1998 - LHC:  prox 85-90, RCA prox 30-50, EF 65-70 >> UDJ:SHFW and BMS to LAD; large Dx jailed by stent (90%), EF 60  // Myoview 7/18: normal perfusion, Low Risk   History of echocardiogram    Echo 7/18: mild LVH, EF 60-65, no RWMA, mild MR, mod LAE   Myocardial infarction [facility])    Pancreatic cancer ([name])    Stroke ([place]) 04/2014    Past Surgical History:  Procedure Laterality Date   BILIARY STENT PLACEMENT N/A 03/28/2021   Procedure: BILIARY STENT PLACEMENT;  Surgeon: Clarene Essex, MD;  Location: [facility] ENDOSCOPY;  Service: Endoscopy;  Laterality: N/A;   CARDIOVERSION N/A 06/20/2015   Procedure: CARDIOVERSION;  Surgeon: [name], MD;  Location: [facility];  Service: Cardiovascular;  Laterality: N/A;   COLONOSCOPY WITH PROPOFOL N/A 02/07/2015   Procedure: COLONOSCOPY WITH PROPOFOL;  Surgeon: [name], MD;  Location: [facility];  Service: Endoscopy;  Laterality: N/A;   CORONARY STENT PLACEMENT  2001   ERCP N/A 03/28/2021   Procedure: ENDOSCOPIC RETROGRADE CHOLANGIOPANCREATOGRAPHY (ERCP);  Surgeon: Clarene Essex, MD;  Location: [facility];  Service: Endoscopy;  Laterality: N/A;    ESOPHAGOGASTRODUODENOSCOPY N/A 03/28/2021   Procedure: ESOPHAGOGASTRODUODENOSCOPY (EGD);  Surgeon: Arta Silence, MD;  Location: [facility];  Service: Gastroenterology;  Laterality: N/A;   EUS N/A 03/28/2021   Procedure: FULL UPPER ENDOSCOPIC ULTRASOUND (EUS) RADIAL;  Surgeon: Arta Silence, MD;  Location: [facility];  Service: Gastroenterology;  Laterality: N/A;   FINE NEEDLE ASPIRATION N/A 03/28/2021   Procedure: FINE NEEDLE ASPIRATION (FNA) LINEAR;  Surgeon: Arta Silence, MD;  Location: [facility];  Service: Gastroenterology;  Laterality: N/A;   KNEE SURGERY  1972   PORTACATH PLACEMENT N/A 04/18/2021   Procedure: PORT PLACEMENT;  Surgeon: Stark Klein, MD;  Location: [facility];  Service: General;  Laterality: N/A;   SPHINCTEROTOMY  03/28/2021   Procedure: SPHINCTEROTOMY;  Surgeon: Clarene Essex, MD;  Location: [facility];  Service: Endoscopy;;    Prior to Admission medications   Medication Sig Start Date End Date Taking? Authorizing Provider  apixaban (ELIQUIS) 5 MG TABS tablet Take 1 tablet (5 mg total) by mouth 2 (two) times daily. 05/23/21  Yes Deboraha Sprang, MD  atorvastatin (LIPITOR) 10 MG tablet Take 10 mg by mouth daily.   Yes [provider]  famotidine (PEPCID) 20 MG tablet Take 20 mg by mouth at bedtime as needed for heartburn or indigestion.   Yes [provider]  lidocaine-prilocaine (EMLA) cream Apply to affected area once Patient taking differently: Apply 1 application. topically daily as needed. Apply to affected area 04/11/21  Yes Truitt Merle, MD  loratadine (CLARITIN) 10 MG tablet Take 10 mg by mouth  daily as needed for allergies (Take after Chemo treatments).   Yes [provider]  Multiple Vitamin (MULTI-VITAMIN DAILY PO) Take 1 tablet by mouth daily.   Yes [provider]  Omega-3 Fatty Acids (FISH OIL PO) Take 1 tablet by mouth daily.   Yes [provider]  prochlorperazine (COMPAZINE) 10 MG tablet  Take 1 tablet (10 mg total) by mouth every 6 (six) hours as needed (Nausea or vomiting). Patient taking differently: Take 10 mg by mouth every 6 (six) hours as needed for nausea or vomiting. 07/12/21  Yes Truitt Merle, MD  ondansetron (ZOFRAN) 8 MG tablet Take 1 tablet (8 mg total) by mouth 2 (two) times daily as needed. Start on day 3 after chemotherapy. Patient not taking: Reported on 08/05/2021 07/12/21   Truitt Merle, MD    Current Facility-Administered Medications  Medication Dose Route Frequency Provider Last Rate Last Admin   acetaminophen (TYLENOL) tablet 650 mg  650 mg Oral Q6H PRN Tu, Ching T, DO       ceFEPIme (MAXIPIME) 2 g in sodium chloride 0.9 % 100 mL IVPB  2 g Intravenous Q8H Adrian Saran, [facility]   Stopped at 08/06/21 9242   Chlorhexidine Gluconate Cloth 2 % PADS 6 each  6 each Topical Q0600 Tu, Ching T, DO   6 each at 08/06/21 0142   famotidine (PEPCID) tablet 20 mg  20 mg Oral QHS PRN Tu, Ching T, DO   20 mg at 08/05/21 2321   lactated ringers infusion   Intravenous Continuous Evlyn Courier, PA-C 150 mL/hr at 08/06/21 6834 Infusion Verify at 08/06/21 1962   MEDLINE mouth rinse  15 mL Mouth Rinse BID Tu, Ching T, DO   15 mL at 08/05/21 2207   metroNIDAZOLE (FLAGYL) IVPB 500 mg  500 mg Intravenous Q12H Tu, Ching T, DO   Stopped at 08/06/21 2297   omega-3 acid ethyl esters (LOVAZA) capsule 1 g  1 g Oral Daily Tu, Ching T, DO       vancomycin (VANCOREADY) IVPB 1500 mg/300 mL  1,500 mg Intravenous Q24H Arlyn Dunning M, [facility]        Allergies as of 08/05/2021   (No Known Allergies)    Family History  Problem Relation Age of Onset   Cancer Father 14       tongue/neck   Heart attack Maternal Uncle    Cancer Paternal Uncle        unk type    Social History   Socioeconomic History   Marital status: Married    Spouse name: Patty   Number of children: 3   Years of education: Associate   Highest education level: Not on file  Occupational History   Occupation: [employer]    Tobacco Use   Smoking status: Never   Smokeless tobacco: Former    Types: Chew    Quit date: 03/20/1999   Tobacco comments:    quit chewing 7 years ago  Vaping Use   Vaping Use: Never used  Substance and Sexual Activity   Alcohol use: No    Alcohol/week: 0.0 standard drinks   Drug use: No   Sexual activity: Not on file  Other Topics Concern   Not on file  Social History Narrative   Lives at home with wife.    Caffeine: Drinks coffee- about 3 cups per day   Social Determinants of Health   Financial Resource Strain: Not on file  Food Insecurity: Not on file  Transportation  Needs: Not on file  Physical Activity: Not on file  Stress: Not on file  Social Connections: Not on file  Intimate Partner Violence: Not on file    Review of Systems: As per HPI, all others negative  Physical Exam: Vital signs in last 24 hours: Temp:  [97.8 F (36.6 C)-102.6 F (39.2 C)] 97.8 F (36.6 C) (05/21 0740) Pulse Rate:  [73-155] 88 (05/21 0600) Resp:  [15-38] 22 (05/21 0600) BP: (84-139)/(51-86) 92/57 (05/21 0600) SpO2:  [94 %-99 %] 96 % (05/21 0600) Last BM Date :  (PTA) General:   Alert,  Well-developed, well-nourished, NAD Head:  Normocephalic and atraumatic. Eyes:  Sclera icteric.   Conjunctiva pink. Ears:  Normal auditory acuity. Nose:  No deformity, discharge,  or lesions. Mouth:  No deformity or lesions.  Oropharynx dry Neck:  Supple; no masses or thyromegaly. Abdomen:  Soft, non-distended, mild upper abdominal tenderness No masses, hepatosplenomegaly or hernias noted. Normal bowel sounds, without guarding, and without rebound.     Msk:  Symmetrical without gross deformities. Normal posture. Pulses:  Normal pulses noted. Extremities:  Without clubbing or edema. Neurologic:  Alert and  oriented x4; diffusely weak, otherwise grossly normal neurologically. Skin:  Jaundiced, scattered ecchymoses, otherwise Intact without significant lesions or rashes. Psych:  Alert and  cooperative. Normal mood and affect.   Lab Results: Recent Labs    08/05/21 1409 08/06/21 0315  WBC 42.3* 48.2*  HGB 13.7 10.7*  HCT 38.9* 32.3*  PLT 95* 64*   BMET Recent Labs    08/05/21 1409 08/06/21 0315  NA 139 136  K 3.6 3.5  CL 102 103  CO2 25 23  GLUCOSE 199* 133*  BUN 14 13  CREATININE 0.54* 0.50*  CALCIUM 9.1 8.4*   LFT Recent Labs    08/06/21 0315  PROT 5.4*  ALBUMIN 2.5*  AST 213*  ALT 327*  ALKPHOS 129*  BILITOT 6.6*   PT/INR Recent Labs    08/05/21 1407  LABPROT 14.9  INR 1.2    Studies/Results: CT Angio Chest PE W and/or Wo Contrast  Result Date: 08/05/2021 CLINICAL DATA:  Shortness of breath, positive D-dimer EXAM: CT ANGIOGRAPHY CHEST WITH CONTRAST TECHNIQUE: Multidetector CT imaging of the chest was performed using the standard protocol during bolus administration of intravenous contrast. Multiplanar CT image reconstructions and MIPs were obtained to evaluate the vascular anatomy. RADIATION DOSE REDUCTION: This exam was performed according to the departmental dose-optimization program which includes automated exposure control, adjustment of the mA and/or kV according to patient size and/or use of iterative reconstruction technique. CONTRAST:  138m OMNIPAQUE IOHEXOL 350 MG/ML SOLN COMPARISON:  Chest radiographs done on 07/05/2021 FINDINGS: Cardiovascular: There is homogeneous enhancement in thoracic aorta. Coronary artery calcifications are seen. There are no intraluminal filling defects in the central pulmonary artery branches. Evaluation of small peripheral branches is limited by motion artifacts. Mediastinum/Nodes: No new significant lymphadenopathy seen. Thyroid is smaller than usual in size. Lungs/Pleura: There are linear densities in the posterior lower lung fields suggesting subsegmental atelectasis. There is no focal pulmonary consolidation. There is no pleural effusion or pneumothorax. Small 4 mm nodular density seen in the right lower lobe  in the previous examination appears to be obscured by subsegmental atelectasis. Upper Abdomen: Biliary stent is noted in common bile duct. Spleen appears to be enlarged measuring 14 cm in maximum diameter. Musculoskeletal: Unremarkable. Review of the MIP images confirms the above findings. IMPRESSION: There is no evidence of central pulmonary artery embolism. Evaluation of small peripheral  pulmonary artery branches is limited by motion artifacts. There is no evidence of thoracic aortic dissection. Coronary artery calcifications are seen. Small linear patchy infiltrates in both lower lung fields may suggest subsegmental atelectasis. There is no pleural effusion or pneumothorax. Electronically Signed   By: Elmer Picker M.D.   On: 08/05/2021 16:12   CT ABDOMEN PELVIS W CONTRAST  Result Date: 08/05/2021 CLINICAL DATA:  Acute pancreatitis. On chemotherapy for pancreatic cancer. EXAM: CT ABDOMEN AND PELVIS WITH CONTRAST TECHNIQUE: Multidetector CT imaging of the abdomen and pelvis was performed using the standard protocol following bolus administration of intravenous contrast. RADIATION DOSE REDUCTION: This exam was performed according to the departmental dose-optimization program which includes automated exposure control, adjustment of the mA and/or kV according to patient size and/or use of iterative reconstruction technique. CONTRAST:  136m OMNIPAQUE IOHEXOL 350 MG/ML SOLN COMPARISON:  CT abdomen and pelvis 07/05/2021 FINDINGS: Lower chest: There is atelectasis in the lung bases Hepatobiliary: There is new mild diffuse intrahepatic biliary ductal dilatation. Common bile duct stent remains in place, unchanged from prior. No new focal liver lesions are identified. Gallbladder is within normal limits. Pancreas: Unremarkable. No pancreatic ductal dilatation or surrounding inflammatory changes. Spleen: Normal in size without focal abnormality. Adrenals/Urinary Tract: There is a rounded hypodensity in the left  kidney which is too small to characterize and unchanged, favored as a cyst. The kidneys, adrenal glands and bladder are otherwise within normal limits. Stomach/Bowel: There is mild diffuse colonic wall thickening without surrounding inflammation. The appendix is not seen. Small bowel and stomach are within normal limits. No bowel obstruction, pneumatosis or free air. Vascular/Lymphatic: Aortic atherosclerosis. No enlarged abdominal or pelvic lymph nodes. Reproductive: Prostate gland is enlarged, unchanged. Other: No abdominal wall hernia or abnormality. No abdominopelvic ascites. Musculoskeletal: No acute or significant osseous findings. IMPRESSION: 1. There is new intrahepatic biliary ductal dilatation. Common bile duct stent remains in place and appears in adequate position. Distal biliary obstruction or stent malfunction not excluded. 2. The pancreas appears within normal limits. Mild diffuse colonic wall thickening worrisome for nonspecific colitis. 3.  Aortic Atherosclerosis (ICD10-I70.0). Electronically Signed   By: ARonney AstersM.D.   On: 08/05/2021 16:07   UKoreaAbdomen Limited RUQ (LIVER/GB)  Result Date: 08/05/2021 CLINICAL DATA:  Provided history of cholecystitis. Radiologic records indicates history of pancreatic cancer, on chemotherapy. EXAM: ULTRASOUND ABDOMEN LIMITED RIGHT UPPER QUADRANT COMPARISON:  Abdominopelvic CT earlier today.  Ultrasound 03/03/2021 FINDINGS: Gallbladder: Physiologically distended. No gallstone. No gallbladder wall thickening. There may be minimal layering sludge. No sonographic Murphy sign noted by sonographer. Common bile duct: Diameter: 7 mm at the porta hepatis. The known CBD stent is not demonstrated by ultrasound. Liver: Mild increased hepatic echogenicity typical of steatosis. There is intrahepatic biliary ductal dilatation that was also seen on prior CT. No discrete focal lesion. Portal vein is patent on color Doppler imaging with normal direction of blood flow towards  the liver. Other: No right upper quadrant ascites. Technically limited exam due to overlying bowel gas and patient's difficulty with breath hold. IMPRESSION: 1. No sonographic findings of cholecystitis.  No gallstones. 2. Intrahepatic biliary ductal dilatation which was better demonstrated on CT earlier today. Proximal common bile duct measures 7 mm, the common duct stent on CT is not well delineated on the current exam. 3. No focal liver lesion.  Mild hepatic steatosis. Electronically Signed   By: MKeith RakeM.D.   On: 08/05/2021 17:19    Impression:   Pancreatic head adenocarcinoma diagnosed  January s/p CBD stenting at that time.  Currently on chemo, finished last dose couple days ago. Abdominal pain, elevated LFTs, leukocytosis, CBD dilatation.  Suspect cholangitis.  Has improved on antibiotics.  Suspect either intrastent debris or tumor ingrowth (stent was uncovered metal). Leukocytosis, likely combination infection and neupogen. Low platelets, suspect chemotherapy mediated. Atrial fibrillation and CAD, on apixiban, last dose yesterday (?).  Plan:   Supportive care with IVF, antibiotics. Case discussed with Dr. Carlean Purl, who is covering biliary cases today.  Will plan for repeat ERCP today. Risks (up to and including bleeding, infection, perforation, pancreatitis that can be complicated by infected necrosis and death), benefits (removal of stones, alleviating blockage, decreasing risk of cholangitis or choledocholithiasis-related pancreatitis), and alternatives (watchful waiting, percutaneous transhepatic cholangiography) of ERCP were explained to patient/family in detail and patient elects to proceed.    LOS: 1 day   Jacky Dross M  08/06/2021, 9:03 AM  Cell [phone] If no answer or after 5 PM call [phone]

## 2021-08-06 NOTE — Transfer of Care (Signed)
Immediate Anesthesia Transfer of Care Note  Patient: Casey Pierce  Procedure(s) Performed: ENDOSCOPIC RETROGRADE CHOLANGIOPANCREATOGRAPHY (ERCP)  Patient Location: Endoscopy Unit  Anesthesia Type:General  Level of Consciousness: drowsy  Airway & Oxygen Therapy: Patient Spontanous Breathing and Patient connected to face mask oxygen  Post-op Assessment: Report given to RN and Post -op Vital signs reviewed and stable  Post vital signs: Reviewed and stable  Last Vitals:  Vitals Value Taken Time  BP 104/59 08/06/21 1145  Temp    Pulse 93 08/06/21 1146  Resp 16 08/06/21 1146  SpO2 100 % 08/06/21 1146  Vitals shown include unvalidated device data.  Last Pain:  Vitals:   08/06/21 1042  TempSrc: Oral  PainSc: 0-No pain         Complications: No notable events documented.

## 2021-08-06 NOTE — Progress Notes (Signed)
Progress Note   Patient: Casey Pierce. BMW:[id number] DOB: August 29, 1955 DOA: 08/05/2021     1 DOS: the patient was seen and examined on 08/06/2021   Brief hospital course: 66 y.o. male with medical history significant of Hx of CAD with bare metal stent LAD, CVA, permanent atrial fibrillation on Eliquis, pancreatic cancer s/p biliary stent placement (03/2021) on chemotherapy who presents with abdominal pain.   He was diagnosed with pancreatic head mass causing obstruction of the distal common bile duct in December 2022.  He underwent common bile duct stenting in 03/28/2021 with Dr. Watt Climes of GI.  He has been following with Dr. Barry Dienes and has plans for Whipple procedure in July.  Currently is undergoing cardiac clearance with pending stress test on 5/30.   In the ED, he was febrile up to 102.37F, atrial fibrillation with RVR with rates up to 130 on room air.  WBC of 42.3 K, platelet of 95.  Lactate 3.9.  CMP with mildly elevated lipase of 57, AST of 350, ALT of 514, alkaline phosphatase of 206 with and 6.7 total bilirubin.   CT abdomen/pelvis showed new mild diffuse intrahepatic biliary ductal dilatation with CBD stent in place.  Gallbladder is within normal limits.  Pancreas unremarkable.  Mild diffuse colonic wall thickening worrisome for nonspecific colitis.   CTA chest obtained for tachypnea negative for PE or acute findings.    ED PA discussed with [facility]   Assessment and Plan: * Sepsis ([place]) Presented with fever, tachycardia and tachypnea -CT A/P showed new mild diffuse intrahepatic biliary ductal dilatation with CBD stent in place.  Gallbladder is within normal limits.  Pancreas unremarkable.  Mild diffuse colonic wall thickening worrisome for nonspecific colitis. -continue broad spectrum antibiotics with IV vancomycin, flagyl and cefepime. Blood cultures pending.  -lactate trending down   Biliary obstruction -now s/p ERCP with stent placement 5/21  Pancreatic cancer ([place]) New  intrahepatic ductal diltation s/p biliary stent in 03/2021 -CMP with mildly elevated lipase of 57, AST of 350, ALT of 514, alkaline phosphatase of 206 with and 6.7 total bilirubin. -He was diagnosed with pancreatic head mass causing obstruction of the distal common bile duct in December 2022.  He underwent common bile duct stenting in 03/28/2021 with Dr. Watt Climes of GI.  He has been following with Dr. Barry Dienes and has plans for Whipple procedure in July.  Currently is undergoing cardiac clearance with pending stress test on 5/30. -Follows with Dr. Burr Medico with oncology- last infusion of Pegfilgrastrim 5/19, likely resulted in current leukocytosis  -[facility] following with results noted. Pt now s/p ERCP and biliary stent placement  Permanent atrial fibrillation ([facility]) Initially presented with RVR with rates up to 140 but improving with fluid -keep on continuous telemetry  -holding overnight Eliquis pending GI consult in the morning.  He has been permitted by outpatient cardiology to hold anticoagulation for interventions without bridging.  Last dose this morning (5/20)   Coronary artery disease involving native coronary artery of native heart with angina pectoris ([place]) Asymptomatic.  Hold statin for now with elevated LFTs  History of stroke -Eliquis on hold secondary to procedure today -Would resume anticoagulation when OK with GI      Subjective: Seen prior to ERCP this AM. Denied abd discomfort  Physical Exam: Vitals:   08/06/21 1145 08/06/21 1151 08/06/21 1200 08/06/21 1210  BP: (!) 104/59 1'03/67 95/75 91/71 '$  Pulse: 100 (!) 101 88 89  Resp: (!) 22 (!) 23 (!) 21 (!) 21  Temp:  97.7 F (36.5 C)     TempSrc: Axillary     SpO2: 100% 100% 94% 94%   General exam: Awake, laying in bed, in nad Respiratory system: Normal respiratory effort, no wheezing Cardiovascular system: regular rate, s1, s2 Gastrointestinal system: Soft, nondistended, positive BS Central nervous system: CN2-12 grossly intact,  strength intact Extremities: Perfused, no clubbing Skin: Normal skin turgor, no notable skin lesions seen Psychiatry: Mood normal // no visual hallucinations   Data Reviewed:  Labs reviewed: Na 136, K 3.5, Cr 0.5, AST 213, ALT 327, TB 6.6   Family Communication: Pt in room, family at bedside  Disposition: Status is: Inpatient Remains inpatient appropriate because: Severity of illness  Planned Discharge Destination: Home    Author: Marylu Lund, MD 08/06/2021 2:07 PM  For on call review [email or web address].

## 2021-08-06 NOTE — Plan of Care (Signed)

## 2021-08-06 NOTE — Consult Note (Signed)
[facility] Consultation Note  Referring Provider: [facility] Primary Care Physician:  Lavone Orn, MD Primary Gastroenterologist:  Dr. Watt Climes  Reason for Consultation:  elevated LFTs  HPI: Casey Pierce. is a 66 y.o. male history pancreatic cancer with uncovered biliary stent placed January 2023.  Just completed course of chemotherapy couple days ago; plan for Whipple in July.  He is on apixaban for atrial fibrillation and CAD, last dose yesterday (?).  Has had one day history of dark urine, chills and upper abdominal pain.  Found temp 102.6 and elevated LFTs with CT scan showing intra and extrahepatic biliary ductal dilatation.  Feels better since admission and antibiotics.   Past Medical History:  Diagnosis Date   Atrial fibrillation ([place])    Coronary artery disease    NSTEMI 12/1998 - LHC:  prox 85-90, RCA prox 30-50, EF 65-70 >> ASN:KNLZ and BMS to LAD; large Dx jailed by stent (90%), EF 60  // Myoview 7/18: normal perfusion, Low Risk   History of echocardiogram    Echo 7/18: mild LVH, EF 60-65, no RWMA, mild MR, mod LAE   Myocardial infarction [facility])    Pancreatic cancer ([place])    Stroke ([place]) 04/2014    Past Surgical History:  Procedure Laterality Date   BILIARY STENT PLACEMENT N/A 03/28/2021   Procedure: BILIARY STENT PLACEMENT;  Surgeon: Clarene Essex, MD;  Location: [facility];  Service: Endoscopy;  Laterality: N/A;   CARDIOVERSION N/A 06/20/2015   Procedure: CARDIOVERSION;  Surgeon: [name], MD;  Location: [facility];  Service: Cardiovascular;  Laterality: N/A;   COLONOSCOPY WITH PROPOFOL N/A 02/07/2015   Procedure: COLONOSCOPY WITH PROPOFOL;  Surgeon: [name], MD;  Location: [facility];  Service: Endoscopy;  Laterality: N/A;   CORONARY STENT PLACEMENT  2001   ERCP N/A 03/28/2021   Procedure: ENDOSCOPIC RETROGRADE CHOLANGIOPANCREATOGRAPHY (ERCP);  Surgeon: Clarene Essex, MD;  Location: [facility];  Service: Endoscopy;  Laterality: N/A;    ESOPHAGOGASTRODUODENOSCOPY N/A 03/28/2021   Procedure: ESOPHAGOGASTRODUODENOSCOPY (EGD);  Surgeon: Arta Silence, MD;  Location: [facility];  Service: Gastroenterology;  Laterality: N/A;   EUS N/A 03/28/2021   Procedure: FULL UPPER ENDOSCOPIC ULTRASOUND (EUS) RADIAL;  Surgeon: Arta Silence, MD;  Location: [facility];  Service: Gastroenterology;  Laterality: N/A;   FINE NEEDLE ASPIRATION N/A 03/28/2021   Procedure: FINE NEEDLE ASPIRATION (FNA) LINEAR;  Surgeon: Arta Silence, MD;  Location: [facility];  Service: Gastroenterology;  Laterality: N/A;   KNEE SURGERY  1972   PORTACATH PLACEMENT N/A 04/18/2021   Procedure: PORT PLACEMENT;  Surgeon: Stark Klein, MD;  Location: [facility];  Service: General;  Laterality: N/A;   SPHINCTEROTOMY  03/28/2021   Procedure: SPHINCTEROTOMY;  Surgeon: Clarene Essex, MD;  Location: [facility];  Service: Endoscopy;;    Prior to Admission medications   Medication Sig Start Date End Date Taking? Authorizing Provider  apixaban (ELIQUIS) 5 MG TABS tablet Take 1 tablet (5 mg total) by mouth 2 (two) times daily. 05/23/21  Yes Deboraha Sprang, MD  atorvastatin (LIPITOR) 10 MG tablet Take 10 mg by mouth daily.   Yes [provider]  famotidine (PEPCID) 20 MG tablet Take 20 mg by mouth at bedtime as needed for heartburn or indigestion.   Yes [provider]  lidocaine-prilocaine (EMLA) cream Apply to affected area once Patient taking differently: Apply 1 application. topically daily as needed. Apply to affected area 04/11/21  Yes Truitt Merle, MD  loratadine (CLARITIN) 10 MG tablet Take 10 mg by mouth  daily as needed for allergies (Take after Chemo treatments).   Yes [provider]  Multiple Vitamin (MULTI-VITAMIN DAILY PO) Take 1 tablet by mouth daily.   Yes [provider]  Omega-3 Fatty Acids (FISH OIL PO) Take 1 tablet by mouth daily.   Yes [provider]  prochlorperazine (COMPAZINE) 10 MG tablet  Take 1 tablet (10 mg total) by mouth every 6 (six) hours as needed (Nausea or vomiting). Patient taking differently: Take 10 mg by mouth every 6 (six) hours as needed for nausea or vomiting. 07/12/21  Yes Truitt Merle, MD  ondansetron (ZOFRAN) 8 MG tablet Take 1 tablet (8 mg total) by mouth 2 (two) times daily as needed. Start on day 3 after chemotherapy. Patient not taking: Reported on 08/05/2021 07/12/21   Truitt Merle, MD    Current Facility-Administered Medications  Medication Dose Route Frequency Provider Last Rate Last Admin   acetaminophen (TYLENOL) tablet 650 mg  650 mg Oral Q6H PRN Tu, Ching T, DO       ceFEPIme (MAXIPIME) 2 g in sodium chloride 0.9 % 100 mL IVPB  2 g Intravenous Q8H Adrian Saran, [facility]   Stopped at 08/06/21 9381   Chlorhexidine Gluconate Cloth 2 % PADS 6 each  6 each Topical Q0600 Tu, Ching T, DO   6 each at 08/06/21 0142   famotidine (PEPCID) tablet 20 mg  20 mg Oral QHS PRN Tu, Ching T, DO   20 mg at 08/05/21 2321   lactated ringers infusion   Intravenous Continuous Evlyn Courier, PA-C 150 mL/hr at 08/06/21 8299 Infusion Verify at 08/06/21 3716   MEDLINE mouth rinse  15 mL Mouth Rinse BID Tu, Ching T, DO   15 mL at 08/05/21 2207   metroNIDAZOLE (FLAGYL) IVPB 500 mg  500 mg Intravenous Q12H Tu, Ching T, DO   Stopped at 08/06/21 9678   omega-3 acid ethyl esters (LOVAZA) capsule 1 g  1 g Oral Daily Tu, Ching T, DO       vancomycin (VANCOREADY) IVPB 1500 mg/300 mL  1,500 mg Intravenous Q24H Arlyn Dunning M, [facility]        Allergies as of 08/05/2021   (No Known Allergies)    Family History  Problem Relation Age of Onset   Cancer Father 69       tongue/neck   Heart attack Maternal Uncle    Cancer Paternal Uncle        unk type    Social History   Socioeconomic History   Marital status: Married    Spouse name: Patty   Number of children: 3   Years of education: Associate   Highest education level: Not on file  Occupational History   Occupation: [employer]    Tobacco Use   Smoking status: Never   Smokeless tobacco: Former    Types: Chew    Quit date: 03/20/1999   Tobacco comments:    quit chewing 7 years ago  Vaping Use   Vaping Use: Never used  Substance and Sexual Activity   Alcohol use: No    Alcohol/week: 0.0 standard drinks   Drug use: No   Sexual activity: Not on file  Other Topics Concern   Not on file  Social History Narrative   Lives at home with wife.    Caffeine: Drinks coffee- about 3 cups per day   Social Determinants of Health   Financial Resource Strain: Not on file  Food Insecurity: Not on file  Transportation  Needs: Not on file  Physical Activity: Not on file  Stress: Not on file  Social Connections: Not on file  Intimate Partner Violence: Not on file    Review of Systems: As per HPI, all others negative  Physical Exam: Vital signs in last 24 hours: Temp:  [97.8 F (36.6 C)-102.6 F (39.2 C)] 97.8 F (36.6 C) (05/21 0740) Pulse Rate:  [73-155] 88 (05/21 0600) Resp:  [15-38] 22 (05/21 0600) BP: (84-139)/(51-86) 92/57 (05/21 0600) SpO2:  [94 %-99 %] 96 % (05/21 0600) Last BM Date :  (PTA) General:   Alert,  Well-developed, well-nourished, NAD Head:  Normocephalic and atraumatic. Eyes:  Sclera icteric.   Conjunctiva pink. Ears:  Normal auditory acuity. Nose:  No deformity, discharge,  or lesions. Mouth:  No deformity or lesions.  Oropharynx dry Neck:  Supple; no masses or thyromegaly. Abdomen:  Soft, non-distended, mild upper abdominal tenderness No masses, hepatosplenomegaly or hernias noted. Normal bowel sounds, without guarding, and without rebound.     Msk:  Symmetrical without gross deformities. Normal posture. Pulses:  Normal pulses noted. Extremities:  Without clubbing or edema. Neurologic:  Alert and  oriented x4; diffusely weak, otherwise grossly normal neurologically. Skin:  Jaundiced, scattered ecchymoses, otherwise Intact without significant lesions or rashes. Psych:  Alert and  cooperative. Normal mood and affect.   Lab Results: Recent Labs    08/05/21 1409 08/06/21 0315  WBC 42.3* 48.2*  HGB 13.7 10.7*  HCT 38.9* 32.3*  PLT 95* 64*   BMET Recent Labs    08/05/21 1409 08/06/21 0315  NA 139 136  K 3.6 3.5  CL 102 103  CO2 25 23  GLUCOSE 199* 133*  BUN 14 13  CREATININE 0.54* 0.50*  CALCIUM 9.1 8.4*   LFT Recent Labs    08/06/21 0315  PROT 5.4*  ALBUMIN 2.5*  AST 213*  ALT 327*  ALKPHOS 129*  BILITOT 6.6*   PT/INR Recent Labs    08/05/21 1407  LABPROT 14.9  INR 1.2    Studies/Results: CT Angio Chest PE W and/or Wo Contrast  Result Date: 08/05/2021 CLINICAL DATA:  Shortness of breath, positive D-dimer EXAM: CT ANGIOGRAPHY CHEST WITH CONTRAST TECHNIQUE: Multidetector CT imaging of the chest was performed using the standard protocol during bolus administration of intravenous contrast. Multiplanar CT image reconstructions and MIPs were obtained to evaluate the vascular anatomy. RADIATION DOSE REDUCTION: This exam was performed according to the departmental dose-optimization program which includes automated exposure control, adjustment of the mA and/or kV according to patient size and/or use of iterative reconstruction technique. CONTRAST:  174m OMNIPAQUE IOHEXOL 350 MG/ML SOLN COMPARISON:  Chest radiographs done on 07/05/2021 FINDINGS: Cardiovascular: There is homogeneous enhancement in thoracic aorta. Coronary artery calcifications are seen. There are no intraluminal filling defects in the central pulmonary artery branches. Evaluation of small peripheral branches is limited by motion artifacts. Mediastinum/Nodes: No new significant lymphadenopathy seen. Thyroid is smaller than usual in size. Lungs/Pleura: There are linear densities in the posterior lower lung fields suggesting subsegmental atelectasis. There is no focal pulmonary consolidation. There is no pleural effusion or pneumothorax. Small 4 mm nodular density seen in the right lower lobe  in the previous examination appears to be obscured by subsegmental atelectasis. Upper Abdomen: Biliary stent is noted in common bile duct. Spleen appears to be enlarged measuring 14 cm in maximum diameter. Musculoskeletal: Unremarkable. Review of the MIP images confirms the above findings. IMPRESSION: There is no evidence of central pulmonary artery embolism. Evaluation of small peripheral  pulmonary artery branches is limited by motion artifacts. There is no evidence of thoracic aortic dissection. Coronary artery calcifications are seen. Small linear patchy infiltrates in both lower lung fields may suggest subsegmental atelectasis. There is no pleural effusion or pneumothorax. Electronically Signed   By: Elmer Picker M.D.   On: 08/05/2021 16:12   CT ABDOMEN PELVIS W CONTRAST  Result Date: 08/05/2021 CLINICAL DATA:  Acute pancreatitis. On chemotherapy for pancreatic cancer. EXAM: CT ABDOMEN AND PELVIS WITH CONTRAST TECHNIQUE: Multidetector CT imaging of the abdomen and pelvis was performed using the standard protocol following bolus administration of intravenous contrast. RADIATION DOSE REDUCTION: This exam was performed according to the departmental dose-optimization program which includes automated exposure control, adjustment of the mA and/or kV according to patient size and/or use of iterative reconstruction technique. CONTRAST:  179m OMNIPAQUE IOHEXOL 350 MG/ML SOLN COMPARISON:  CT abdomen and pelvis 07/05/2021 FINDINGS: Lower chest: There is atelectasis in the lung bases Hepatobiliary: There is new mild diffuse intrahepatic biliary ductal dilatation. Common bile duct stent remains in place, unchanged from prior. No new focal liver lesions are identified. Gallbladder is within normal limits. Pancreas: Unremarkable. No pancreatic ductal dilatation or surrounding inflammatory changes. Spleen: Normal in size without focal abnormality. Adrenals/Urinary Tract: There is a rounded hypodensity in the left  kidney which is too small to characterize and unchanged, favored as a cyst. The kidneys, adrenal glands and bladder are otherwise within normal limits. Stomach/Bowel: There is mild diffuse colonic wall thickening without surrounding inflammation. The appendix is not seen. Small bowel and stomach are within normal limits. No bowel obstruction, pneumatosis or free air. Vascular/Lymphatic: Aortic atherosclerosis. No enlarged abdominal or pelvic lymph nodes. Reproductive: Prostate gland is enlarged, unchanged. Other: No abdominal wall hernia or abnormality. No abdominopelvic ascites. Musculoskeletal: No acute or significant osseous findings. IMPRESSION: 1. There is new intrahepatic biliary ductal dilatation. Common bile duct stent remains in place and appears in adequate position. Distal biliary obstruction or stent malfunction not excluded. 2. The pancreas appears within normal limits. Mild diffuse colonic wall thickening worrisome for nonspecific colitis. 3.  Aortic Atherosclerosis (ICD10-I70.0). Electronically Signed   By: ARonney AstersM.D.   On: 08/05/2021 16:07   UKoreaAbdomen Limited RUQ (LIVER/GB)  Result Date: 08/05/2021 CLINICAL DATA:  Provided history of cholecystitis. Radiologic records indicates history of pancreatic cancer, on chemotherapy. EXAM: ULTRASOUND ABDOMEN LIMITED RIGHT UPPER QUADRANT COMPARISON:  Abdominopelvic CT earlier today.  Ultrasound 03/03/2021 FINDINGS: Gallbladder: Physiologically distended. No gallstone. No gallbladder wall thickening. There may be minimal layering sludge. No sonographic Murphy sign noted by sonographer. Common bile duct: Diameter: 7 mm at the porta hepatis. The known CBD stent is not demonstrated by ultrasound. Liver: Mild increased hepatic echogenicity typical of steatosis. There is intrahepatic biliary ductal dilatation that was also seen on prior CT. No discrete focal lesion. Portal vein is patent on color Doppler imaging with normal direction of blood flow towards  the liver. Other: No right upper quadrant ascites. Technically limited exam due to overlying bowel gas and patient's difficulty with breath hold. IMPRESSION: 1. No sonographic findings of cholecystitis.  No gallstones. 2. Intrahepatic biliary ductal dilatation which was better demonstrated on CT earlier today. Proximal common bile duct measures 7 mm, the common duct stent on CT is not well delineated on the current exam. 3. No focal liver lesion.  Mild hepatic steatosis. Electronically Signed   By: MKeith RakeM.D.   On: 08/05/2021 17:19    Impression:   Pancreatic head adenocarcinoma diagnosed  January s/p CBD stenting at that time.  Currently on chemo, finished last dose couple days ago. Abdominal pain, elevated LFTs, leukocytosis, CBD dilatation.  Suspect cholangitis.  Has improved on antibiotics.  Suspect either intrastent debris or tumor ingrowth (stent was uncovered metal). Leukocytosis, likely combination infection and neupogen. Low platelets, suspect chemotherapy mediated. Atrial fibrillation and CAD, on apixiban, last dose yesterday (?).  Plan:   Supportive care with IVF, antibiotics. Case discussed with Dr. Carlean Purl, who is covering biliary cases today.  Will plan for repeat ERCP today. Risks (up to and including bleeding, infection, perforation, pancreatitis that can be complicated by infected necrosis and death), benefits (removal of stones, alleviating blockage, decreasing risk of cholangitis or choledocholithiasis-related pancreatitis), and alternatives (watchful waiting, percutaneous transhepatic cholangiography) of ERCP were explained to patient/family in detail and patient elects to proceed.    LOS: 1 day   Orville Mena M  08/06/2021, 9:03 AM  Cell [phone] If no answer or after 5 PM call [phone]

## 2021-08-06 NOTE — Hospital Course (Signed)
66 y.o. male with medical history significant of Hx of CAD with bare metal stent LAD, CVA, permanent atrial fibrillation on Eliquis, pancreatic cancer s/p biliary stent placement (03/2021) on chemotherapy who presents with abdominal pain.   He was diagnosed with pancreatic head mass causing obstruction of the distal common bile duct in December 2022.  He underwent common bile duct stenting in 03/28/2021 with Dr. Watt Climes of GI.  He has been following with Dr. Barry Dienes and has plans for Whipple procedure in July.  Currently is undergoing cardiac clearance with pending stress test on 5/30.   In the ED, he was febrile up to 102.16F, atrial fibrillation with RVR with rates up to 130 on room air.  WBC of 42.3 K, platelet of 95.  Lactate 3.9.  CMP with mildly elevated lipase of 57, AST of 350, ALT of 514, alkaline phosphatase of 206 with and 6.7 total bilirubin.   CT abdomen/pelvis showed new mild diffuse intrahepatic biliary ductal dilatation with CBD stent in place.  Gallbladder is within normal limits.  Pancreas unremarkable.  Mild diffuse colonic wall thickening worrisome for nonspecific colitis.   CTA chest obtained for tachypnea negative for PE or acute findings.    ED PA discussed with [facility]

## 2021-08-06 NOTE — Op Note (Signed)
[facility] Patient Name: Casey Pierce Procedure Date: 08/06/2021 MRN: [id number] Attending MD: Gatha Mayer , MD Date of Birth: 06-20-1955 CSN: [id number] Age: 66 Admit Type: Inpatient Procedure:                ERCP Indications:              Jaundice, Malignant tumor of the head of pancreas,                            sepsis, suspect stent occlusion Providers:                Gatha Mayer, MD, Grace Isaac, RN, Cherylynn Ridges, Technician, Danley Danker, CRNA Referring MD:              Medicines:                General Anesthesia, On cefipime, vancomycin and                            metronidazole Complications:            No immediate complications. Estimated blood loss:                            Minimal. Estimated Blood Loss:     Estimated blood loss was minimal. Procedure:                Pre-Anesthesia Assessment:                           - Prior to the procedure, a History and Physical                            was performed, and patient medications and                            allergies were reviewed. The patient's tolerance of                            previous anesthesia was also reviewed. The risks                            and benefits of the procedure and the sedation                            options and risks were discussed with the patient.                            All questions were answered, and informed consent                            was obtained. Prior Anticoagulants: The patient                            last took  Eliquis (apixaban) 1 day prior to the                            procedure. ASA Grade Assessment: III - A patient                            with severe systemic disease. After reviewing the                            risks and benefits, the patient was deemed in                            satisfactory condition to undergo the procedure.                           After obtaining informed  consent, the scope was                            passed under direct vision. Throughout the                            procedure, the patient's blood pressure, pulse, and                            oxygen saturations were monitored continuously. The                            [id number]-Q190V ([id number]) Olympus duodenoscope was                            introduced through the mouth, and used to inject                            contrast into and used to inject contrast into the                            bile duct. The ERCP was accomplished without                            difficulty. The patient tolerated the procedure                            well. Scope In: Scope Out: Findings:      A biliary stent was visible on the scout film. Existing SEMS seen at       papilla - w/ signs of ingrowth and occlusion - no bile draining. It was       cannulated with sphincterotome and wire causing some release of blood       from duct. Contrast injected, stricturing of CBD w/in stent seen - mild       intrahepatic dilation but relatively normal ducts proximal to the stent.       Placed 10 Fr 7 cm plastic stent w/ proximal edge above proximal edge of       SEMS. No bile draining. Injected  plastic stent and it was patent and       filled the biliary tree and drained contrast.Sphincterotome passed       alongside stent and contrast injected - no other obstruction, lesions       seen. Gallbladder not filled. Pancreas not attempted. Scope withdrawn. Impression:               - A previously placed biliary stent was totally                            occluded - suspect mucosal hyperplasia and                            ingrowth. This was treated by inserting a new stent                            inside the old stent. Bile did not drain but                            injection of stent proved patency and drainage of                            contrast. Moderate Sedation:      Not Applicable - Patient had care  per Anesthesia. Recommendation:           - Return patient to hospital ward for ongoing care.                           - F/U LFT's                           If they fail to decrease consider MRCP vs repeat                            ERCP - the stent placement today was technically                            successful. It could be that bile flow is altered                            due to sepsis and there will be a delay in drainage                            of bile through stent. I have seen that before. Procedure Code(s):        --- Professional ---                           [phone], Endoscopic retrograde                            cholangiopancreatography (ERCP); with placement of                            endoscopic stent into biliary or pancreatic duct,  including pre- and post-dilation and guide wire                            passage, when performed, including sphincterotomy,                            when performed, each stent Diagnosis Code(s):        --- Professional ---                           T85.590A, Other mechanical complication of bile                            duct prosthesis, initial encounter                           R17, Unspecified jaundice                           C25.0, Malignant neoplasm of head of pancreas CPT copyright 2019 [employer]. All rights reserved. The codes documented in this report are preliminary and upon coder review may  be revised to meet current compliance requirements. Gatha Mayer, MD 08/06/2021 11:44:50 AM This report has been signed electronically. Number of Addenda: 0

## 2021-08-07 ENCOUNTER — Encounter (HOSPITAL_COMMUNITY): Payer: Self-pay | Admitting: Internal Medicine

## 2021-08-07 DIAGNOSIS — K831 Obstruction of bile duct: Secondary | ICD-10-CM | POA: Diagnosis not present

## 2021-08-07 DIAGNOSIS — A419 Sepsis, unspecified organism: Secondary | ICD-10-CM | POA: Diagnosis not present

## 2021-08-07 DIAGNOSIS — I25119 Atherosclerotic heart disease of native coronary artery with unspecified angina pectoris: Secondary | ICD-10-CM | POA: Diagnosis not present

## 2021-08-07 DIAGNOSIS — Z8673 Personal history of transient ischemic attack (TIA), and cerebral infarction without residual deficits: Secondary | ICD-10-CM | POA: Diagnosis not present

## 2021-08-07 LAB — BLOOD CULTURE ID PANEL (REFLEXED) - BCID2

## 2021-08-07 LAB — CBC
HCT: 29.7 % — ABNORMAL LOW (ref 39.0–52.0)
Hemoglobin: 10.2 g/dL — ABNORMAL LOW (ref 13.0–17.0)
MCH: 35.3 pg — ABNORMAL HIGH (ref 26.0–34.0)
MCHC: 34.3 g/dL (ref 30.0–36.0)
MCV: 102.8 fL — ABNORMAL HIGH (ref 80.0–100.0)
Platelets: 45 10*3/uL — ABNORMAL LOW (ref 150–400)
RBC: 2.89 MIL/uL — ABNORMAL LOW (ref 4.22–5.81)
RDW: 17 % — ABNORMAL HIGH (ref 11.5–15.5)
WBC: 48.8 10*3/uL — ABNORMAL HIGH (ref 4.0–10.5)
nRBC: 0 % (ref 0.0–0.2)

## 2021-08-07 LAB — COMPREHENSIVE METABOLIC PANEL
ALT: 250 U/L — ABNORMAL HIGH (ref 0–44)
AST: 141 U/L — ABNORMAL HIGH (ref 15–41)
Albumin: 2.5 g/dL — ABNORMAL LOW (ref 3.5–5.0)
Alkaline Phosphatase: 121 U/L (ref 38–126)
Anion gap: 7 (ref 5–15)
BUN: 15 mg/dL (ref 8–23)
CO2: 23 mmol/L (ref 22–32)
Calcium: 8.5 mg/dL — ABNORMAL LOW (ref 8.9–10.3)
Chloride: 106 mmol/L (ref 98–111)
Creatinine, Ser: 0.55 mg/dL — ABNORMAL LOW (ref 0.61–1.24)
GFR, Estimated: >60 mL/min (ref >60)
Glucose, Bld: 158 mg/dL — ABNORMAL HIGH (ref 70–99)
Potassium: 3.9 mmol/L (ref 3.5–5.1)
Sodium: 136 mmol/L (ref 135–145)
Total Bilirubin: 4.3 mg/dL — ABNORMAL HIGH (ref 0.3–1.2)
Total Protein: 5.4 g/dL — ABNORMAL LOW (ref 6.5–8.1)

## 2021-08-07 MED ORDER — HEPARIN SOD (PORK) LOCK FLUSH 100 UNIT/ML IV SOLN
500.0000 [IU] | INTRAVENOUS | Status: AC | PRN
  Administered 2021-08-07: 500 [IU]

## 2021-08-07 MED ORDER — AMOXICILLIN-POT CLAVULANATE 875-125 MG PO TABS: 1.0000 | ORAL_TABLET | Freq: Two times a day (BID) | ORAL | 0 refills | Status: AC

## 2021-08-07 MED ORDER — APIXABAN 5 MG PO TABS
5.0000 mg | ORAL_TABLET | Freq: Two times a day (BID) | ORAL | Status: DC
  Administered 2021-08-07: 5 mg via ORAL
  Filled 2021-08-07: qty 1

## 2021-08-07 NOTE — Progress Notes (Signed)
Transition of Care [facility]) Screening Note  Patient Details  Name: Casey Pierce. Date of Birth: Oct 01, 1955  Transition of Care [facility]) CM/SW Contact:    Sherie Don, LCSW Phone Number: 08/07/2021, 11:23 AM  Transition of Care Department [facility]) has reviewed patient and no TOC needs have been identified at this time. We will continue to monitor patient advancement through interdisciplinary progression rounds. If new patient transition needs arise, please place a TOC consult.

## 2021-08-07 NOTE — Progress Notes (Signed)
[facility] Progress Note  Hanish Laraia. 66 y.o. Apr 11, 1955  Subjective: Patient seen and examined lying comfortably in bed.  Denies abdominal pain.  Tolerating clear liquid diet well.  Is having liquid bowel movements but this is normal for him after receiving chemo treatments.  S/p ERCP 5/21  ROS : Review of Systems  Gastrointestinal:  Negative for abdominal pain, blood in stool, constipation, diarrhea, heartburn, melena, nausea and vomiting.  Genitourinary:  Negative for dysuria and urgency.     Objective: Vital signs in last 24 hours: Vitals:   08/07/21 0700 08/07/21 0800  BP: 132/83 126/74  Pulse: (!) 58 77  Resp: 19 (!) 24  Temp:    SpO2: 97% 98%    Physical Exam:  General:  Alert, cooperative, no distress, appears stated age  Head:  Normocephalic, without obvious abnormality, atraumatic  Eyes:  Anicteric sclera, EOM's intact  Lungs:   Clear to auscultation bilaterally, respirations unlabored  Heart:  Regular rate and rhythm, S1, S2 normal  Abdomen:   Soft, non-tender, bowel sounds active all four quadrants,  no masses,   Extremities: Extremities normal, atraumatic, no  edema  Pulses: 2+ and symmetric    Lab Results: Recent Labs    08/06/21 0315 08/07/21 0246  NA 136 136  K 3.5 3.9  CL 103 106  CO2 23 23  GLUCOSE 133* 158*  BUN 13 15  CREATININE 0.50* 0.55*  CALCIUM 8.4* 8.5*   Recent Labs    08/06/21 0315 08/07/21 0246  AST 213* 141*  ALT 327* 250*  ALKPHOS 129* 121  BILITOT 6.6* 4.3*  PROT 5.4* 5.4*  ALBUMIN 2.5* 2.5*   Recent Labs    08/05/21 1409 08/06/21 0315 08/07/21 0246  WBC 42.3* 48.2* 48.8*  NEUTROABS 39.9*  --   --   HGB 13.7 10.7* 10.2*  HCT 38.9* 32.3* 29.7*  MCV 101.0* 103.9* 102.8*  PLT 95* 64* 45*   Recent Labs    08/05/21 1407  LABPROT 14.9  INR 1.2      Assessment Pancreatic cancer Cholangitis  CT abdomen pelvis with contrast 08/05/2021 Intrahepatic biliary duct dilation, Common bile duct  stent remains in place and appears in adequate position distal biliary obstruction or stent malfunction not excluded.  RUQ ultrasound 08/05/2021 1. No sonographic findings of cholecystitis.  No gallstones. 2. Intrahepatic biliary ductal dilatation which was better demonstrated on CT earlier today. Proximal common bile duct measures 7 mm, the common duct stent on CT is not well delineated on the current exam.  ERCP 5/21 A previously placed biliary stent was totally occluded - suspect mucosal hyperplasia and ingrowth. This was treated by inserting a new stent inside the old stent. Bile did not drain but injection of stent proved patency and drainage of contrast.  Patient had occlusion of previously placed biliary stent.  Likely the cause of elevated liver tests.  LFTs downtrending today.  Overall patient is improving.  Plan: Progress to soft diet Continue to monitor LFTs Continue supportive care and antibiotics per primary team. [facility] will follow.  Arvella Nigh Yessenia Maillet PA-C 08/07/2021, 9:17 AM  Contact #  [phone]

## 2021-08-07 NOTE — Progress Notes (Addendum)
HEMATOLOGY-ONCOLOGY PROGRESS NOTE  ASSESSMENT AND PLAN: 1.  Sepsis, improved 2.  Biliary obstruction 3.  Stage IIb pancreatic adenocarcinoma 4.  Leukocytosis 5.  Thrombocytopenia 6.  Anemia 7.  Permanent A-fib 8.  CAD 9.  History of CVA  -On IV antibiotics and afebrile.  Blood cultures positive for gram-positive cocci in pairs in the aerobic bottle and 1 set of the cultures.?  Contaminant. -Status post ERCP with stent placement.  LFTs trending downward.  Monitor. -He has completed his systemic chemotherapy for his pancreatic cancer.  Scheduled for Whipple procedure in July with Dr. Barry Dienes. -Noted to have leukocytosis.  Received G-CSF on 5/19.  Monitor WBC. -He has anemia and thrombocytopenia due to recent chemotherapy.  Monitor.  Transfuse PRBCs for hemoglobin less than 7.5 and transfuse platelets for platelet count less than 20,000 or active bleeding.  [name], DNP, AGPCNP-BC, AOCNP  SUBJECTIVE: Casey Pierce is followed by our office for stage IIb pancreatic adenocarcinoma.  He has been receiving neoadjuvant chemotherapy with FOLFIRINOX last given on 08/02/2021.  This was his final planned cycle of chemotherapy.  Now admitted with biliary obstruction.  Status post ERCP and stent placement on 5/21.  Today, he reports that he feels better.  He is not having any abdominal pain, nausea, vomiting.  No fevers. No bleeding. Tolerated soft diet this morning.   Oncology History  Pancreatic cancer ([place])  03/03/2021 Imaging   US Abdomen Limited RUQ IMPRESSION: Probable fatty infiltration of liver as above.   Distended gallbladder without definite stones or wall thickening.   Borderline CBD dilatation, CBD 7 mm diameter.   03/15/2021 Imaging   MR ABDOMEN MRCP W WO CONTAST   IMPRESSION: 1. Enhancing 2.6 x 2.2 x 3.3 cm mass in the head of the pancreas causing obstruction of the distal common bile duct resulting in intra and extrahepatic biliary ductal dilatation. Findings are highly  concerning for primary pancreatic neoplasm. Further evaluation with endoscopic ultrasound is strongly recommended in the near future. 2. No definite signs of metastatic disease identified in the abdomen.    03/28/2021 Procedure   DG ERCP WITH SPHINCTEROTOMY  Performed by Dr. Watt Climes  FINDINGS: A total of 4 fluoroscopic spot images taken during ERCP are submitted for review. Initial images demonstrate scope overlying the upper abdomen with wire catheterization of the common bile duct. Contrast opacification of the common bile duct demonstrates a dilated appearance with abrupt shouldering compatible with obstruction. A metal CBD stent is then placed, and appears in grossly satisfactory position.   IMPRESSION: Fluoroscopic spot images taken during ERCP with metallic stent deployment as described. Refer to dedicated procedure report for full details.   03/28/2021 Procedure   UPPER ENDOSCOPIC ULTRASOUND  - There was dilation in the common bile duct which measured up to 15 mm. - A few abnormal lymph nodes were visualized in the porta hepatis region. - A mass was identified in the pancreatic head. This was staged T3 N1 Mx by endosonographic criteria. Fine needle aspiration performed. - There was no evidence of significant pathology in the left lobe of the liver.  Findings: There was dilation in the common bile duct which measured up to 15 mm. A few abnormal lymph nodes were visualized in the porta hepatis region. The nodes were hypoechoic and had poorly defined margins. An irregular mass was identified in the pancreatic head. The mass was hypoechoic. The mass measured 25 mm by 20 mm in maximal cross-sectional diameter. The endosonographic borders were poorlydefined. An intact interface was seen between  the mass and the superior mesenteric artery, portal vein, superior mesenteric vein and splenic vein suggesting a lack of invasion. The remainder of the pancreas was examined. The  endosonographic appearance of parenchyma and the upstream pancreatic duct indicated duct dilation. Fine needle aspiration for cytology was performed. Color Doppler imaging was utilized prior to needle puncture to confirm a lack of significant vascular structures within the needle path. Three passes were made with the 25 gauge needle using a transduodenal approach. A stylet was used. A cytotechnologist was present to evaluate the adequacy of the specimen. Preliminary cytology is suspicious for adenocarcinoma (final results are pending).   03/28/2021 Pathology Results   CYTOLOGY - NON PAP  CASE: [id number]  PATIENT: Casey Pierce  Non-Gynecological Cytology Report   Clinical History: Obstructive jaudice  Specimen Submitted:  A. PANCREAS, HEAD, FINE NEEDLE ASPIRATION:    FINAL MICROSCOPIC DIAGNOSIS:  - Malignant cells consistent with adenocarcinoma    03/28/2021 Cancer Staging   Staging form: Exocrine Pancreas, AJCC 8th Edition - Clinical stage from 03/28/2021: Stage IIB (cT2, cN1, cM0) - Signed by Truitt Merle, MD on 04/06/2021 Stage prefix: Initial diagnosis    04/05/2021 Initial Diagnosis   Pancreatic cancer ([place])   04/19/2021 -  Chemotherapy   Patient is on Treatment Plan : PANCREAS Modified FOLFIRINOX q14d x 4 cycles      05/01/2021 Genetic Testing   Negative genetic testing. No pathogenic variants identified on the [facility] Multi-Cancer+RNA panel. The report date is 05/14/2021.  The Multi-Cancer Panel + RNA offered by [facility] includes sequencing and/or deletion duplication testing of the following 84 genes: AIP, ALK, APC, ATM, AXIN2,BAP1,  BARD1, BLM, BMPR1A, BRCA1, BRCA2, BRIP1, CASR, CDC73, CDH1, CDK4, CDKN1B, CDKN1C, CDKN2A (p14ARF), CDKN2A (p16INK4a), CEBPA, CHEK2, CTNNA1, DICER1, DIS3L2, EGFR (c.2369C>T, p.Thr790Met variant only), EPCAM (Deletion/duplication testing only), FH, FLCN, GATA2, GPC3, GREM1 (Promoter region deletion/duplication testing only), HOXB13 (c.251G>A, p.Gly84Glu),  HRAS, KIT, MAX, MEN1, MET, MITF (c.952G>A, p.Glu318Lys variant only), MLH1, MSH2, MSH3, MSH6, MUTYH, NBN, NF1, NF2, NTHL1, PALB2, PDGFRA, PHOX2B, PMS2, POLD1, POLE, POT1, PRKAR1A, PTCH1, PTEN, RAD50, RAD51C, RAD51D, RB1, RECQL4, RET, RUNX1, SDHAF2, SDHA (sequence changes only), SDHB, SDHC, SDHD, SMAD4, SMARCA4, SMARCB1, SMARCE1, STK11, SUFU, TERC, TERT, TMEM127, TP53, TSC1, TSC2, VHL, WRN and WT1.   07/05/2021 Imaging   EXAM: CT CHEST WITHOUT, CT ABDOMEN AND PELVIS WITH AND WITHOUT CONTRAST  IMPRESSION: 1. Today's study demonstrates positive response to therapy with no discernible residual pancreatic head mass confidently identified on today's examination. Common bile duct stent appears appropriately located, and there is no residual intrahepatic biliary ductal dilatation. 2. No definite signs of metastatic disease in the chest, abdomen or pelvis. Specifically, previously noted small pulmonary nodules measuring 4 mm or less in size are stable in number and size, favored to be benign. 3. Short segment mural thickening and luminal narrowing in the proximal ascending colon. The possibility of a primary colonic neoplasm warrants consideration, and further evaluation with nonemergent colonoscopy is recommended in the near future to better evaluate this finding. 4. Aortic atherosclerosis, in addition to left main and three-vessel coronary artery disease. Please note that although the presence of coronary artery calcium documents the presence of coronary artery disease, the severity of this disease and any potential stenosis cannot be assessed on this non-gated CT examination. Assessment for potential risk factor modification, dietary therapy or pharmacologic therapy may be warranted, if clinically indicated. 5. There are calcifications of the aortic valve. Echocardiographic correlation for evaluation of potential valvular dysfunction may be warranted if clinically  indicated. 6. Similar areas of  interstitial prominence throughout the lungs bilaterally, which could suggest early or mild interstitial lung disease. Continued attention on follow-up imaging is recommended. Outpatient referral to Pulmonology for further clinical evaluation should also be considered if clinically appropriate.      REVIEW OF SYSTEMS:   Review of Systems  Constitutional:  Negative for chills and fever.  HENT: Negative.    Respiratory: Negative.    Cardiovascular: Negative.   Gastrointestinal: Negative.   Skin: Negative.   Neurological: Negative.   Endo/Heme/Allergies: Negative.   Psychiatric/Behavioral: Negative.     I have reviewed the past medical history, past surgical history, social history and family history with the patient and they are unchanged from previous note.   PHYSICAL EXAMINATION: ECOG PERFORMANCE STATUS: 1 - Symptomatic but completely ambulatory  Vitals:   08/07/21 0700 08/07/21 0800  BP: 132/83 126/74  Pulse: (!) 58 77  Resp: 19 (!) 24  Temp:    SpO2: 97% 98%   There were no vitals filed for this visit.  Intake/Output from previous day: 05/21 0701 - 05/22 0700 In: 1737.9 [I.V.:995.5; IV Piggyback:742.4] Out: -   Physical Exam Vitals reviewed.  Constitutional:      General: He is not in acute distress. HENT:     Head: Normocephalic.  Eyes:     General: No scleral icterus.    Conjunctiva/sclera: Conjunctivae normal.  Abdominal:     General: Bowel sounds are normal. There is no distension.     Palpations: Abdomen is soft.     Tenderness: There is no abdominal tenderness.  Skin:    General: Skin is warm and dry.  Neurological:     Mental Status: He is alert and oriented to person, place, and time.    LABORATORY DATA:  I have reviewed the data as listed    Latest Ref Rng & Units 08/07/2021    2:46 AM 08/06/2021    3:15 AM 08/05/2021    2:09 PM  CMP  Glucose 70 - 99 mg/dL 158   133   199    BUN 8 - 23 mg/dL 15   13   14     Creatinine 0.61 - 1.24 mg/dL 0.55    0.50   0.54    Sodium 135 - 145 mmol/L 136   136   139    Potassium 3.5 - 5.1 mmol/L 3.9   3.5   3.6    Chloride 98 - 111 mmol/L 106   103   102    CO2 22 - 32 mmol/L 23   23   25     Calcium 8.9 - 10.3 mg/dL 8.5   8.4   9.1    Total Protein 6.5 - 8.1 g/dL 5.4   5.4   6.5    Total Bilirubin 0.3 - 1.2 mg/dL 4.3   6.6   6.7    Alkaline Phos 38 - 126 U/L 121   129   206    AST 15 - 41 U/L 141   213   350    ALT 0 - 44 U/L 250   327   514      Lab Results  Component Value Date   WBC 48.8 (H) 08/07/2021   HGB 10.2 (L) 08/07/2021   HCT 29.7 (L) 08/07/2021   MCV 102.8 (H) 08/07/2021   PLT 45 (L) 08/07/2021   NEUTROABS 39.9 (H) 08/05/2021    Lab Results  Component Value Date   CAN199 106 (H)  07/26/2021    CT Angio Chest PE W and/or Wo Contrast  Result Date: 08/05/2021 CLINICAL DATA:  Shortness of breath, positive D-dimer EXAM: CT ANGIOGRAPHY CHEST WITH CONTRAST TECHNIQUE: Multidetector CT imaging of the chest was performed using the standard protocol during bolus administration of intravenous contrast. Multiplanar CT image reconstructions and MIPs were obtained to evaluate the vascular anatomy. RADIATION DOSE REDUCTION: This exam was performed according to the departmental dose-optimization program which includes automated exposure control, adjustment of the mA and/or kV according to patient size and/or use of iterative reconstruction technique. CONTRAST:  131m OMNIPAQUE IOHEXOL 350 MG/ML SOLN COMPARISON:  Chest radiographs done on 07/05/2021 FINDINGS: Cardiovascular: There is homogeneous enhancement in thoracic aorta. Coronary artery calcifications are seen. There are no intraluminal filling defects in the central pulmonary artery branches. Evaluation of small peripheral branches is limited by motion artifacts. Mediastinum/Nodes: No new significant lymphadenopathy seen. Thyroid is smaller than usual in size. Lungs/Pleura: There are linear densities in the posterior lower lung fields  suggesting subsegmental atelectasis. There is no focal pulmonary consolidation. There is no pleural effusion or pneumothorax. Small 4 mm nodular density seen in the right lower lobe in the previous examination appears to be obscured by subsegmental atelectasis. Upper Abdomen: Biliary stent is noted in common bile duct. Spleen appears to be enlarged measuring 14 cm in maximum diameter. Musculoskeletal: Unremarkable. Review of the MIP images confirms the above findings. IMPRESSION: There is no evidence of central pulmonary artery embolism. Evaluation of small peripheral pulmonary artery branches is limited by motion artifacts. There is no evidence of thoracic aortic dissection. Coronary artery calcifications are seen. Small linear patchy infiltrates in both lower lung fields may suggest subsegmental atelectasis. There is no pleural effusion or pneumothorax. Electronically Signed   By: PElmer PickerM.D.   On: 08/05/2021 16:12   CT ABDOMEN PELVIS W CONTRAST  Result Date: 08/05/2021 CLINICAL DATA:  Acute pancreatitis. On chemotherapy for pancreatic cancer. EXAM: CT ABDOMEN AND PELVIS WITH CONTRAST TECHNIQUE: Multidetector CT imaging of the abdomen and pelvis was performed using the standard protocol following bolus administration of intravenous contrast. RADIATION DOSE REDUCTION: This exam was performed according to the departmental dose-optimization program which includes automated exposure control, adjustment of the mA and/or kV according to patient size and/or use of iterative reconstruction technique. CONTRAST:  1037mOMNIPAQUE IOHEXOL 350 MG/ML SOLN COMPARISON:  CT abdomen and pelvis 07/05/2021 FINDINGS: Lower chest: There is atelectasis in the lung bases Hepatobiliary: There is new mild diffuse intrahepatic biliary ductal dilatation. Common bile duct stent remains in place, unchanged from prior. No new focal liver lesions are identified. Gallbladder is within normal limits. Pancreas: Unremarkable. No  pancreatic ductal dilatation or surrounding inflammatory changes. Spleen: Normal in size without focal abnormality. Adrenals/Urinary Tract: There is a rounded hypodensity in the left kidney which is too small to characterize and unchanged, favored as a cyst. The kidneys, adrenal glands and bladder are otherwise within normal limits. Stomach/Bowel: There is mild diffuse colonic wall thickening without surrounding inflammation. The appendix is not seen. Small bowel and stomach are within normal limits. No bowel obstruction, pneumatosis or free air. Vascular/Lymphatic: Aortic atherosclerosis. No enlarged abdominal or pelvic lymph nodes. Reproductive: Prostate gland is enlarged, unchanged. Other: No abdominal wall hernia or abnormality. No abdominopelvic ascites. Musculoskeletal: No acute or significant osseous findings. IMPRESSION: 1. There is new intrahepatic biliary ductal dilatation. Common bile duct stent remains in place and appears in adequate position. Distal biliary obstruction or stent malfunction not excluded. 2. The pancreas appears  within normal limits. Mild diffuse colonic wall thickening worrisome for nonspecific colitis. 3.  Aortic Atherosclerosis (ICD10-I70.0). Electronically Signed   By: Ronney Asters M.D.   On: 08/05/2021 16:07   DG ERCP  Result Date: 08/07/2021 CLINICAL DATA:  ERCP EXAM: ERCP TECHNIQUE: Multiple spot images obtained with the fluoroscopic device and submitted for interpretation post-procedure. FLUOROSCOPY: Refer to separate report COMPARISON:  None Available. FINDINGS: A total of 6 fluoroscopic spot images taken during ERCP are submitted for review. Initial images demonstrate a scope overlying the upper abdomen with a metallic biliary stent place. Wire catheterization through the biliary stent is performed. Contrast injection opacifies the intrahepatic biliary tree. A thin column of contrast material/radiodensity is seen overlying the biliary stent, which may represent additional  plastic biliary stent placement. IMPRESSION: ERCP images as described. Refer to procedure report for full details. These images were submitted for radiologic interpretation only. Please see the procedural report for the amount of contrast and the fluoroscopy time utilized. Electronically Signed   By: Albin Felling M.D.   On: 08/07/2021 07:46   US Abdomen Limited RUQ (LIVER/GB)  Result Date: 08/05/2021 CLINICAL DATA:  Provided history of cholecystitis. Radiologic records indicates history of pancreatic cancer, on chemotherapy. EXAM: ULTRASOUND ABDOMEN LIMITED RIGHT UPPER QUADRANT COMPARISON:  Abdominopelvic CT earlier today.  Ultrasound 03/03/2021 FINDINGS: Gallbladder: Physiologically distended. No gallstone. No gallbladder wall thickening. There may be minimal layering sludge. No sonographic Murphy sign noted by sonographer. Common bile duct: Diameter: 7 mm at the porta hepatis. The known CBD stent is not demonstrated by ultrasound. Liver: Mild increased hepatic echogenicity typical of steatosis. There is intrahepatic biliary ductal dilatation that was also seen on prior CT. No discrete focal lesion. Portal vein is patent on color Doppler imaging with normal direction of blood flow towards the liver. Other: No right upper quadrant ascites. Technically limited exam due to overlying bowel gas and patient's difficulty with breath hold. IMPRESSION: 1. No sonographic findings of cholecystitis.  No gallstones. 2. Intrahepatic biliary ductal dilatation which was better demonstrated on CT earlier today. Proximal common bile duct measures 7 mm, the common duct stent on CT is not well delineated on the current exam. 3. No focal liver lesion.  Mild hepatic steatosis. Electronically Signed   By: Keith Rake M.D.   On: 08/05/2021 17:19     Future Appointments  Date Time Provider [place]  08/15/2021  7:30 AM [facility] NM2/TREAD [id number]  08/23/2021  9:30 AM [facility] [place] [facility]  None  08/23/2021 10:00 AM [place], Arletha Pili, MD [facility] None      LOS: 2 days   Addendum  I have seen the patient, examined him. I agree with the assessment and and plan and have edited the notes.   Patient is well-known to me, under my care for his pancreatic cancer.  He finished neoadjuvant chemotherapy last week, and was admitted for obstructive jaundice, secondary to biliary stent occlusion.  He is feeling much better since the ERCP and a new stent placement, his total bilirubin is coming down.  He will finish the course of antibiotics.  Patient told me he will be discharged home today.  I will set up repeated lab, including CMP and CBC this Friday in my office.  He has a follow-up with [place] in 2 to 3 weeks  Truitt Merle  08/07/2021

## 2021-08-07 NOTE — Discharge Summary (Signed)
Physician Discharge Summary   Patient: Casey Pierce. MRN: [id number] DOB: 1955/08/02  Admit date:     08/05/2021  Discharge date: 08/07/21  Discharge Physician: Casey Pierce   PCP: Casey Orn, MD   Recommendations at discharge:    Follow up with PCP in 1-2 weeks Follow up with Oncology as scheduled Follow up with Casey Pierce as scheduled  Discharge Diagnoses: Principal Problem:   Sepsis ([place]) Active Problems:   History of stroke   Coronary artery disease involving native coronary artery of native heart with angina pectoris ([place])   Permanent atrial fibrillation ([place])   Pancreatic cancer ([place])   Colitis   Biliary obstruction  Resolved Problems:   * No resolved hospital problems. *  Hospital Course: 66 y.o. male with medical history significant of Hx of CAD with bare metal stent LAD, CVA, permanent atrial fibrillation on Eliquis, pancreatic cancer s/p biliary stent placement (03/2021) on chemotherapy who presents with abdominal pain.   He was diagnosed with pancreatic head mass causing obstruction of the distal common bile duct in December 2022.  He underwent common bile duct stenting in 03/28/2021 with Dr. Watt [facility] of [facility].  He has been following with Dr. Barry Pierce and has plans for Whipple procedure in July.  Currently is undergoing cardiac clearance with pending stress test on 5/30.   In the ED, he was febrile up to 102.19F, atrial fibrillation with RVR with rates up to 130 on room air.  WBC of 42.3 K, platelet of 95.  Lactate 3.9.  CMP with mildly elevated lipase of 57, AST of 350, ALT of 514, alkaline phosphatase of 206 with and 6.7 total bilirubin.   CT abdomen/pelvis showed new mild diffuse intrahepatic biliary ductal dilatation with CBD stent in place.  Gallbladder is within normal limits.  Pancreas unremarkable.  Mild diffuse colonic wall thickening worrisome for nonspecific colitis.   CTA chest obtained for tachypnea negative for PE or acute findings.    ED PA discussed with  Casey Pierce   Assessment and Plan: * Sepsis ([place]) Presented with fever, tachycardia and tachypnea -CT A/P showed new mild diffuse intrahepatic biliary ductal dilatation with CBD stent in place.  Gallbladder is within normal limits.  Pancreas unremarkable.  Mild diffuse colonic wall thickening worrisome for nonspecific colitis. -continued broad spectrum antibiotics with IV vancomycin, flagyl and cefepime.  -blood cultures 1/4 staph species, likely contaminant -lactate trending down -Discharged on augmentin to complete 5 days of abx   Biliary obstruction -now s/p ERCP with stent placement 5/21  Pancreatic cancer ([place]) New intrahepatic ductal diltation s/p biliary stent in 03/2021 -CMP with mildly elevated lipase of 57, AST of 350, ALT of 514, alkaline phosphatase of 206 with and 6.7 total bilirubin. -He was diagnosed with pancreatic head mass causing obstruction of the distal common bile duct in December 2022.  He underwent common bile duct stenting in 03/28/2021 with Dr. Watt [facility] of [facility].  He has been following with Dr. Barry Pierce and has plans for Whipple procedure in July.  Currently is undergoing cardiac clearance with pending stress test on 5/30. -Follows with Casey Pierce with oncology- last infusion of Pegfilgrastrim 5/19, likely resulted in current leukocytosis  -Casey Pierce following with results noted. Pt now s/p ERCP and biliary stent placement  Permanent atrial fibrillation ([facility]) Initially presented with RVR with rates up to 140 but improving with fluid -HR remained stable through remainder of visit -Eliquis initially held for procedure, resumed on day of d/c   Coronary artery disease involving native  coronary artery of native heart with angina pectoris ([place]) Asymptomatic.  Hold statin for now with elevated LFTs  History of stroke -Eliquis initially held for ERCP this visit -Pt cleared to resume Eliquis by Pierce on day of d/c         Consultants: Casey Pierce Procedures performed: ERCP 5/21   Disposition: Home Diet recommendation:  Regular diet DISCHARGE MEDICATION: Allergies as of 08/07/2021   No Known Allergies      Medication List     STOP taking these medications    atorvastatin 10 MG tablet Commonly known as: LIPITOR   ondansetron 8 MG tablet Commonly known as: Zofran       TAKE these medications    amoxicillin-clavulanate 875-125 MG tablet Commonly known as: AUGMENTIN Take 1 tablet by mouth 2 (two) times daily for 4 days.   apixaban 5 MG Tabs tablet Commonly known as: Eliquis Take 1 tablet (5 mg total) by mouth 2 (two) times daily.   famotidine 20 MG tablet Commonly known as: PEPCID Take 20 mg by mouth at bedtime as needed for heartburn or indigestion.   FISH OIL PO Take 1 tablet by mouth daily.   lidocaine-prilocaine cream Commonly known as: EMLA Apply to affected area once What changed:  how much to take how to take this when to take this reasons to take this additional instructions   loratadine 10 MG tablet Commonly known as: CLARITIN Take 10 mg by mouth daily as needed for allergies (Take after Chemo treatments).   MULTI-VITAMIN DAILY PO Take 1 tablet by mouth daily.   prochlorperazine 10 MG tablet Commonly known as: COMPAZINE Take 1 tablet (10 mg total) by mouth every 6 (six) hours as needed (Nausea or vomiting). What changed: reasons to take this        Follow-up Information     Casey Orn, MD Follow up in 1 week(s).   Specialty: Internal Medicine Why: Hospital follow up Contact information: [address] [place] [address] [phone]         Casey Sprang, MD .   Specialty: Cardiology Contact information: [phone] [address] [place] [address] [phone]         Casey Merle, MD Follow up.   Specialties: Hematology, Oncology Why: as scheduled Contact information: [place] [address] [phone]                Discharge  Exam: There were no vitals filed for this visit. General exam: Awake, laying in bed, in nad Respiratory system: Normal respiratory effort, no wheezing Cardiovascular system: regular rate, s1, s2 Gastrointestinal system: Soft, nondistended, positive BS Central nervous system: CN2-12 grossly intact, strength intact Extremities: Perfused, no clubbing Skin: Normal skin turgor, no notable skin lesions seen Psychiatry: Mood normal // no visual hallucinations   Condition at discharge: fair  The results of significant diagnostics from this hospitalization (including imaging, microbiology, ancillary and laboratory) are listed below for reference.   Imaging Studies: CT Angio Chest PE W and/or Wo Contrast  Result Date: 08/05/2021 CLINICAL DATA:  Shortness of breath, positive D-dimer EXAM: CT ANGIOGRAPHY CHEST WITH CONTRAST TECHNIQUE: Multidetector CT imaging of the chest was performed using the standard protocol during bolus administration of intravenous contrast. Multiplanar CT image reconstructions and MIPs were obtained to evaluate the vascular anatomy. RADIATION DOSE REDUCTION: This exam was performed according to the departmental dose-optimization program which includes automated exposure control, adjustment of the mA and/or kV according to patient  size and/or use of iterative reconstruction technique. CONTRAST:  112m OMNIPAQUE IOHEXOL 350 MG/ML SOLN COMPARISON:  Chest radiographs done on 07/05/2021 FINDINGS: Cardiovascular: There is homogeneous enhancement in thoracic aorta. Coronary artery calcifications are seen. There are no intraluminal filling defects in the central pulmonary artery branches. Evaluation of small peripheral branches is limited by motion artifacts. Mediastinum/Nodes: No new significant lymphadenopathy seen. Thyroid is smaller than usual in size. Lungs/Pleura: There are linear densities in the posterior lower lung fields suggesting subsegmental atelectasis. There is no focal  pulmonary consolidation. There is no pleural effusion or pneumothorax. Small 4 mm nodular density seen in the right lower lobe in the previous examination appears to be obscured by subsegmental atelectasis. Upper Abdomen: Biliary stent is noted in common bile duct. Spleen appears to be enlarged measuring 14 cm in maximum diameter. Musculoskeletal: Unremarkable. Review of the MIP images confirms the above findings. IMPRESSION: There is no evidence of central pulmonary artery embolism. Evaluation of small peripheral pulmonary artery branches is limited by motion artifacts. There is no evidence of thoracic aortic dissection. Coronary artery calcifications are seen. Small linear patchy infiltrates in both lower lung fields may suggest subsegmental atelectasis. There is no pleural effusion or pneumothorax. Electronically Signed   By: PElmer PickerM.D.   On: 08/05/2021 16:12   CT ABDOMEN PELVIS W CONTRAST  Result Date: 08/05/2021 CLINICAL DATA:  Acute pancreatitis. On chemotherapy for pancreatic cancer. EXAM: CT ABDOMEN AND PELVIS WITH CONTRAST TECHNIQUE: Multidetector CT imaging of the abdomen and pelvis was performed using the standard protocol following bolus administration of intravenous contrast. RADIATION DOSE REDUCTION: This exam was performed according to the departmental dose-optimization program which includes automated exposure control, adjustment of the mA and/or kV according to patient size and/or use of iterative reconstruction technique. CONTRAST:  1043mOMNIPAQUE IOHEXOL 350 MG/ML SOLN COMPARISON:  CT abdomen and pelvis 07/05/2021 FINDINGS: Lower chest: There is atelectasis in the lung bases Hepatobiliary: There is new mild diffuse intrahepatic biliary ductal dilatation. Common bile duct stent remains in place, unchanged from prior. No new focal liver lesions are identified. Gallbladder is within normal limits. Pancreas: Unremarkable. No pancreatic ductal dilatation or surrounding inflammatory  changes. Spleen: Normal in size without focal abnormality. Adrenals/Urinary Tract: There is a rounded hypodensity in the left kidney which is too small to characterize and unchanged, favored as a cyst. The kidneys, adrenal glands and bladder are otherwise within normal limits. Stomach/Bowel: There is mild diffuse colonic wall thickening without surrounding inflammation. The appendix is not seen. Small bowel and stomach are within normal limits. No bowel obstruction, pneumatosis or free air. Vascular/Lymphatic: Aortic atherosclerosis. No enlarged abdominal or pelvic lymph nodes. Reproductive: Prostate gland is enlarged, unchanged. Other: No abdominal wall hernia or abnormality. No abdominopelvic ascites. Musculoskeletal: No acute or significant osseous findings. IMPRESSION: 1. There is new intrahepatic biliary ductal dilatation. Common bile duct stent remains in place and appears in adequate position. Distal biliary obstruction or stent malfunction not excluded. 2. The pancreas appears within normal limits. Mild diffuse colonic wall thickening worrisome for nonspecific colitis. 3.  Aortic Atherosclerosis (ICD10-I70.0). Electronically Signed   By: AmRonney Asters.D.   On: 08/05/2021 16:07   DG ERCP  Result Date: 08/07/2021 CLINICAL DATA:  ERCP EXAM: ERCP TECHNIQUE: Multiple spot images obtained with the fluoroscopic device and submitted for interpretation post-procedure. FLUOROSCOPY: Refer to separate report COMPARISON:  None Available. FINDINGS: A total of 6 fluoroscopic spot images taken during ERCP are submitted for review. Initial images demonstrate a scope overlying  the upper abdomen with a metallic biliary stent place. Wire catheterization through the biliary stent is performed. Contrast injection opacifies the intrahepatic biliary tree. A thin column of contrast material/radiodensity is seen overlying the biliary stent, which may represent additional plastic biliary stent placement. IMPRESSION: ERCP images  as described. Refer to procedure report for full details. These images were submitted for radiologic interpretation only. Please see the procedural report for the amount of contrast and the fluoroscopy time utilized. Electronically Signed   By: Albin Felling M.D.   On: 08/07/2021 07:46   US Abdomen Limited RUQ (LIVER/GB)  Result Date: 08/05/2021 CLINICAL DATA:  Provided history of cholecystitis. Radiologic records indicates history of pancreatic cancer, on chemotherapy. EXAM: ULTRASOUND ABDOMEN LIMITED RIGHT UPPER QUADRANT COMPARISON:  Abdominopelvic CT earlier today.  Ultrasound 03/03/2021 FINDINGS: Gallbladder: Physiologically distended. No gallstone. No gallbladder wall thickening. There may be minimal layering sludge. No sonographic Murphy sign noted by sonographer. Common bile duct: Diameter: 7 mm at the porta hepatis. The known CBD stent is not demonstrated by ultrasound. Liver: Mild increased hepatic echogenicity typical of steatosis. There is intrahepatic biliary ductal dilatation that was also seen on prior CT. No discrete focal lesion. Portal vein is patent on color Doppler imaging with normal direction of blood flow towards the liver. Other: No right upper quadrant ascites. Technically limited exam due to overlying bowel gas and patient's difficulty with breath hold. IMPRESSION: 1. No sonographic findings of cholecystitis.  No gallstones. 2. Intrahepatic biliary ductal dilatation which was better demonstrated on CT earlier today. Proximal common bile duct measures 7 mm, the common duct stent on CT is not well delineated on the current exam. 3. No focal liver lesion.  Mild hepatic steatosis. Electronically Signed   By: Keith Rake M.D.   On: 08/05/2021 17:19    Microbiology: Results for orders placed or performed during the hospital encounter of 08/05/21  Urine Culture     Status: None   Collection Time: 08/05/21  2:07 PM   Specimen: Urine, Clean Catch  Result Value Ref Range Status    Specimen Description   Final    URINE, CLEAN CATCH Performed at [facility], [place] [address], [place], [place] [address]    Special Requests   Final    NONE Performed at [facility], [place] [address], [place], [place] [address]    Culture   Final    NO GROWTH Performed at [facility], [place] [address] [place], [place] [address]    Report Status 08/06/2021 FINAL  Final  Resp Panel by RT-PCR (Flu A&B, Covid) Nasopharyngeal Swab     Status: None   Collection Time: 08/05/21  4:21 PM   Specimen: Nasopharyngeal Swab; Nasopharyngeal(NP) swabs in vial transport medium  Result Value Ref Range Status   SARS Coronavirus 2 by RT PCR NEGATIVE NEGATIVE Final    Comment: (NOTE) SARS-CoV-2 target nucleic acids are NOT DETECTED.  The SARS-CoV-2 RNA is generally detectable in upper respiratory specimens during the acute phase of infection. The lowest concentration of SARS-CoV-2 viral copies this assay can detect is 138 copies/mL. A negative result does not preclude SARS-Cov-2 infection and should not be used as the sole basis for treatment or other patient management decisions. A negative result may occur with  improper specimen collection/handling, submission of specimen other than nasopharyngeal swab, presence of viral mutation(s) within the areas targeted by this assay, and inadequate number of viral copies(<138 copies/mL). A negative result must be combined with  clinical observations, patient history, and epidemiological information. The expected result is Negative.  Fact Sheet for Patients:  EntrepreneurPulse.com.au  Fact Sheet for Healthcare Providers:  IncredibleEmployment.be  This test is no t yet approved or cleared by the [place] FDA and  has been authorized for detection and/or diagnosis of SARS-CoV-2 by FDA under an Emergency Use Authorization (EUA). This EUA will remain  in effect (meaning this  test can be used) for the duration of the COVID-19 declaration under Section 564(b)(1) of the Act, 21 U.S.C.section 360bbb-3(b)(1), unless the authorization is terminated  or revoked sooner.       Influenza A by PCR NEGATIVE NEGATIVE Final   Influenza B by PCR NEGATIVE NEGATIVE Final    Comment: (NOTE) The Xpert Xpress SARS-CoV-2/FLU/RSV plus assay is intended as an aid in the diagnosis of influenza from Nasopharyngeal swab specimens and should not be used as a sole basis for treatment. Nasal washings and aspirates are unacceptable for Xpert Xpress SARS-CoV-2/FLU/RSV testing.  Fact Sheet for Patients: EntrepreneurPulse.com.au  Fact Sheet for Healthcare Providers: IncredibleEmployment.be  This test is not yet approved or cleared by the [place] FDA and has been authorized for detection and/or diagnosis of SARS-CoV-2 by FDA under an Emergency Use Authorization (EUA). This EUA will remain in effect (meaning this test can be used) for the duration of the COVID-19 declaration under Section 564(b)(1) of the Act, 21 U.S.C. section 360bbb-3(b)(1), unless the authorization is terminated or revoked.  Performed at [facility], [place] [address], [place], [place] [address]   Blood culture (routine x 2)     Status: None (Preliminary result)   Collection Time: 08/05/21  5:49 PM   Specimen: BLOOD  Result Value Ref Range Status   Specimen Description   Final    BLOOD Performed at [place] [address] [place], [place] [address]    Special Requests   Final    BOTTLES DRAWN AEROBIC AND ANAEROBIC Blood Culture adequate volume Performed at [place] [address], [place], [place] [address]    Culture  Setup Time   Final    GRAM POSITIVE COCCI IN CHAINS AEROBIC BOTTLE ONLY CRITICAL VALUE NOTED.  VALUE IS CONSISTENT WITH PREVIOUSLY REPORTED AND CALLED VALUE. Performed at [facility], [place] [address], [place], [place] [address]    Culture GRAM POSITIVE COCCI  Final   Report Status PENDING  Incomplete  Blood culture (routine x 2)     Status: None (Preliminary result)   Collection Time: 08/05/21  5:49 PM   Specimen: BLOOD  Result Value Ref Range Status   Specimen Description   Final    BLOOD Performed at [place] [address] [place], [place] [address]    Special Requests   Final    BOTTLES DRAWN AEROBIC AND ANAEROBIC Blood Culture adequate volume Performed at [place] [address] [place], [place] [address]    Culture  Setup Time   Final    GRAM POSITIVE COCCI IN PAIRS AEROBIC BOTTLE ONLY CRITICAL RESULT CALLED TO, READ BACK BY AND VERIFIED WITH: Clent Jacks [phone] FCP Performed at [facility], [place] [address] [place], [place] [address]    Culture [facility] POSITIVE COCCI  Final   Report Status PENDING  Incomplete  Blood Culture ID Panel (Reflexed)     Status: Abnormal   Collection Time: 08/05/21  5:49 PM  Result Value Ref Range Status  Enterococcus faecalis NOT DETECTED NOT DETECTED Final   Enterococcus Faecium NOT DETECTED NOT DETECTED Final   Listeria monocytogenes NOT DETECTED NOT DETECTED Final   Staphylococcus species NOT DETECTED NOT DETECTED Final   Staphylococcus aureus (BCID) NOT DETECTED NOT DETECTED Final   Staphylococcus epidermidis NOT DETECTED NOT DETECTED Final   Staphylococcus lugdunensis NOT DETECTED NOT DETECTED Final   Streptococcus species DETECTED (A) NOT DETECTED Final    Comment: Not Enterococcus species, Streptococcus agalactiae, Streptococcus pyogenes, or Streptococcus pneumoniae. CRITICAL RESULT CALLED TO, READ BACK BY AND VERIFIED WITH: PHARMD CHRISTINE S. [phone] FCP    Streptococcus agalactiae NOT DETECTED NOT DETECTED Final   Streptococcus pneumoniae NOT DETECTED NOT DETECTED Final   Streptococcus pyogenes NOT DETECTED NOT DETECTED Final    A.calcoaceticus-baumannii NOT DETECTED NOT DETECTED Final   Bacteroides fragilis NOT DETECTED NOT DETECTED Final   Enterobacterales NOT DETECTED NOT DETECTED Final   Enterobacter cloacae complex NOT DETECTED NOT DETECTED Final   Escherichia coli NOT DETECTED NOT DETECTED Final   Klebsiella aerogenes NOT DETECTED NOT DETECTED Final   Klebsiella oxytoca NOT DETECTED NOT DETECTED Final   Klebsiella pneumoniae NOT DETECTED NOT DETECTED Final   Proteus species NOT DETECTED NOT DETECTED Final   Salmonella species NOT DETECTED NOT DETECTED Final   Serratia marcescens NOT DETECTED NOT DETECTED Final   Haemophilus influenzae NOT DETECTED NOT DETECTED Final   Neisseria meningitidis NOT DETECTED NOT DETECTED Final   Pseudomonas aeruginosa NOT DETECTED NOT DETECTED Final   Stenotrophomonas maltophilia NOT DETECTED NOT DETECTED Final   Candida albicans NOT DETECTED NOT DETECTED Final   Candida auris NOT DETECTED NOT DETECTED Final   Candida glabrata NOT DETECTED NOT DETECTED Final   Candida krusei NOT DETECTED NOT DETECTED Final   Candida parapsilosis NOT DETECTED NOT DETECTED Final   Candida tropicalis NOT DETECTED NOT DETECTED Final   Cryptococcus neoformans/gattii NOT DETECTED NOT DETECTED Final    Comment: Performed at [facility], [address], [place], [place] [address]  MRSA Next Gen by PCR, Nasal     Status: None   Collection Time: 08/05/21 10:07 PM   Specimen: Nasal Mucosa; Nasal Swab  Result Value Ref Range Status   MRSA by PCR Next Gen NOT DETECTED NOT DETECTED Final    Comment: (NOTE) The GeneXpert MRSA Assay (FDA approved for NASAL specimens only), is one component of a comprehensive MRSA colonization surveillance program. It is not intended to diagnose MRSA infection nor to guide or monitor treatment for MRSA infections. Test performance is not FDA approved in patients less than 67 years old. Performed at [facility], [place] [address], [place],  [place] [address]     Labs: CBC: Recent Labs  Lab 08/02/21 0746 08/05/21 1409 08/06/21 0315 08/07/21 0246  WBC 8.7 42.3* 48.2* 48.8*  NEUTROABS 6.5 39.9*  --   --   HGB 12.9* 13.7 10.7* 10.2*  HCT 36.9* 38.9* 32.3* 29.7*  MCV 97.1 101.0* 103.9* 102.8*  PLT 145* 95* 64* 45*   Basic Metabolic Panel: Recent Labs  Lab 08/02/21 0746 08/05/21 1409 08/06/21 0315 08/07/21 0246  NA 137 139 136 136  K 3.8 3.6 3.5 3.9  CL 103 102 103 106  CO2 '27 25 23 23  '$ GLUCOSE 192* 199* 133* 158*  BUN 5* '14 13 15  '$ CREATININE 0.57* 0.54* 0.50* 0.55*  CALCIUM 9.2 9.1 8.4* 8.5*   Liver Function Tests: Recent Labs  Lab 08/02/21 0746 08/05/21 1409 08/06/21 0315 08/07/21 0246  AST 103*  350* 213* 141*  ALT 122* 514* 327* 250*  ALKPHOS 97 206* 129* 121  BILITOT 0.5 6.7* 6.6* 4.3*  PROT 6.1* 6.5 5.4* 5.4*  ALBUMIN 3.4* 3.2* 2.5* 2.5*   CBG: No results for input(s): GLUCAP in the last 168 hours.  Discharge time spent: less than 30 minutes.  Signed: Marylu Lund, MD [facility] 08/07/2021

## 2021-08-08 ENCOUNTER — Telehealth (HOSPITAL_COMMUNITY): Payer: Self-pay | Admitting: *Deleted

## 2021-08-08 LAB — BLOOD CULTURE ID PANEL (REFLEXED) - BCID2

## 2021-08-08 NOTE — Treatment Plan (Signed)
Received call that pt's second blood cx now pos for strep species, thus likely no longer contaminant. Discussed case with ID pharmacist and ID physician. Recs to cont current abx as prescribed (augmentin) for the time being with very close ID follow up planned. Will likely need TTE and repeat blood cx later.   Called patient. He reports feeling well, no fevers, no further abd discomfort. Pt understands culture results and is aware ID clinic would be arranging close follow up with him. Pt understands to be seen in clinic or return to ED should he become febrile or feel worse in the meantime. All questions answered, pt in agreement with plan.

## 2021-08-08 NOTE — Telephone Encounter (Signed)
Patient given detailed instructions per Myocardial Perfusion Study Information Sheet for the test on 08/15/2021 at 7:30. Patient notified to arrive 15 minutes early and that it is imperative to arrive on time for appointment to keep from having the test rescheduled.  If you need to cancel or reschedule your appointment, please call the office within 24 hours of your appointment. . Patient verbalized understanding.Casey Pierce

## 2021-08-09 ENCOUNTER — Other Ambulatory Visit (HOSPITAL_COMMUNITY): Payer: Self-pay

## 2021-08-09 ENCOUNTER — Other Ambulatory Visit: Payer: Self-pay | Admitting: Internal Medicine

## 2021-08-09 ENCOUNTER — Encounter: Payer: Self-pay | Admitting: Hematology

## 2021-08-09 ENCOUNTER — Telehealth: Payer: Self-pay | Admitting: Internal Medicine

## 2021-08-09 ENCOUNTER — Telehealth (HOSPITAL_BASED_OUTPATIENT_CLINIC_OR_DEPARTMENT_OTHER): Payer: Self-pay | Admitting: Emergency Medicine

## 2021-08-09 MED ORDER — LINEZOLID 600 MG PO TABS: 600.0000 mg | ORAL_TABLET | Freq: Two times a day (BID) | ORAL | Status: DC

## 2021-08-09 MED ORDER — LINEZOLID 600 MG PO TABS: 600.0000 mg | ORAL_TABLET | Freq: Two times a day (BID) | ORAL | 0 refills | Status: DC

## 2021-08-09 NOTE — Telephone Encounter (Signed)
Patient stage 2 pancreatic heada adenocarcinoma on chemo (completed adjuvant chemo a week prior to admission), recent admission 5/20-22 for sepsis in setting of pancreatic head mass obstruction s/p ercp with canulation. Plans for whipple 09/2021. 03/2021 had cbd stent placement  Bcx grew strep anginosus but discharged prior to culture result  Discharged on amoxicillin-clav for another 5 days; had 2 days iv bsAbx here   Patient doing well No n/v/d No f/c Appetite good No cough/chest pain  A/p Strep anginosus bacteremia Likely gi source in setting biliary obstruction  No localizing infectious sx  Would do linezolid 2 weeks. He can finish amox-clav for biliary obstruction empiric coverage as well Has appointment with dr Linus Salmons next week  Of note, his platelet is low after recent chemotherapy anticipating to bounce up next week. Advise him to keep id appointment at least so we can check cbc

## 2021-08-09 NOTE — Addendum Note (Signed)
Addended by: Rolla Flatten on: 08/09/2021 12:22 PM   Modules accepted: Orders

## 2021-08-09 NOTE — TOC Benefit Eligibility Note (Signed)
Patient Teacher, English as a foreign language completed.    The patient is currently admitted and upon discharge could be taking linezolid (Zyvox) 600 mg tablets.  The current 14 day co-pay is, $10.00.   The patient is insured through [place] Patient Advocate Specialist [facility] Patient Advocate Team Direct Number: [phone]  Fax: [phone]

## 2021-08-09 NOTE — Addendum Note (Signed)
Addended by: Rolla Flatten on: 08/09/2021 12:12 PM   Modules accepted: Orders

## 2021-08-10 ENCOUNTER — Other Ambulatory Visit (HOSPITAL_COMMUNITY): Payer: Self-pay

## 2021-08-10 LAB — CULTURE, BLOOD (ROUTINE X 2): Special Requests: ADEQUATE

## 2021-08-11 ENCOUNTER — Other Ambulatory Visit: Payer: Self-pay

## 2021-08-11 ENCOUNTER — Inpatient Hospital Stay: Payer: Medicare PPO | Attending: Hematology

## 2021-08-11 DIAGNOSIS — D6959 Other secondary thrombocytopenia: Secondary | ICD-10-CM | POA: Diagnosis not present

## 2021-08-11 DIAGNOSIS — C250 Malignant neoplasm of head of pancreas: Secondary | ICD-10-CM | POA: Diagnosis not present

## 2021-08-11 DIAGNOSIS — D6481 Anemia due to antineoplastic chemotherapy: Secondary | ICD-10-CM | POA: Insufficient documentation

## 2021-08-11 LAB — CMP (CANCER CENTER ONLY)
ALT: 104 U/L — ABNORMAL HIGH (ref 0–44)
AST: 37 U/L (ref 15–41)
Albumin: 3.3 g/dL — ABNORMAL LOW (ref 3.5–5.0)
Alkaline Phosphatase: 138 U/L — ABNORMAL HIGH (ref 38–126)
Anion gap: 5 (ref 5–15)
BUN: 8 mg/dL (ref 8–23)
CO2: 28 mmol/L (ref 22–32)
Calcium: 9 mg/dL (ref 8.9–10.3)
Chloride: 101 mmol/L (ref 98–111)
Creatinine: 0.48 mg/dL — ABNORMAL LOW (ref 0.61–1.24)
GFR, Estimated: >60 mL/min (ref >60)
Glucose, Bld: 142 mg/dL — ABNORMAL HIGH (ref 70–99)
Potassium: 3.7 mmol/L (ref 3.5–5.1)
Sodium: 134 mmol/L — ABNORMAL LOW (ref 135–145)
Total Bilirubin: 1.3 mg/dL — ABNORMAL HIGH (ref 0.3–1.2)
Total Protein: 6.1 g/dL — ABNORMAL LOW (ref 6.5–8.1)

## 2021-08-11 LAB — CBC WITH DIFFERENTIAL (CANCER CENTER ONLY)
Abs Immature Granulocytes: 0.09 10*3/uL — ABNORMAL HIGH (ref 0.00–0.07)
Basophils Absolute: 0.1 10*3/uL (ref 0.0–0.1)
Basophils Relative: 0 %
Eosinophils Absolute: 0 10*3/uL (ref 0.0–0.5)
Eosinophils Relative: 0 %
HCT: 33 % — ABNORMAL LOW (ref 39.0–52.0)
Hemoglobin: 11.4 g/dL — ABNORMAL LOW (ref 13.0–17.0)
Immature Granulocytes: 1 %
Lymphocytes Relative: 8 %
Lymphs Abs: 0.9 10*3/uL (ref 0.7–4.0)
MCH: 34.4 pg — ABNORMAL HIGH (ref 26.0–34.0)
MCHC: 34.5 g/dL (ref 30.0–36.0)
MCV: 99.7 fL (ref 80.0–100.0)
Monocytes Absolute: 0.7 10*3/uL (ref 0.1–1.0)
Monocytes Relative: 6 %
Neutro Abs: 9.9 10*3/uL — ABNORMAL HIGH (ref 1.7–7.7)
Neutrophils Relative %: 85 %
Platelet Count: 54 10*3/uL — ABNORMAL LOW (ref 150–400)
RBC: 3.31 MIL/uL — ABNORMAL LOW (ref 4.22–5.81)
RDW: 15.9 % — ABNORMAL HIGH (ref 11.5–15.5)
WBC Count: 11.7 10*3/uL — ABNORMAL HIGH (ref 4.0–10.5)
nRBC: 0 % (ref 0.0–0.2)

## 2021-08-11 MED ORDER — SODIUM CHLORIDE 0.9% FLUSH
10.0000 mL | Freq: Once | INTRAVENOUS | Status: AC
  Administered 2021-08-11: 10 mL

## 2021-08-11 MED ORDER — HEPARIN SOD (PORK) LOCK FLUSH 100 UNIT/ML IV SOLN
500.0000 [IU] | INTRAVENOUS | Status: AC | PRN
  Administered 2021-08-11: 500 [IU]

## 2021-08-12 LAB — CULTURE, BLOOD (ROUTINE X 2): Special Requests: ADEQUATE

## 2021-08-15 ENCOUNTER — Ambulatory Visit (HOSPITAL_COMMUNITY): Payer: Medicare PPO | Attending: Internal Medicine

## 2021-08-15 DIAGNOSIS — I25119 Atherosclerotic heart disease of native coronary artery with unspecified angina pectoris: Secondary | ICD-10-CM | POA: Diagnosis not present

## 2021-08-15 DIAGNOSIS — Z01810 Encounter for preprocedural cardiovascular examination: Secondary | ICD-10-CM | POA: Diagnosis not present

## 2021-08-15 DIAGNOSIS — Z01818 Encounter for other preprocedural examination: Secondary | ICD-10-CM | POA: Diagnosis not present

## 2021-08-15 LAB — MYOCARDIAL PERFUSION IMAGING
LV dias vol: 60 mL (ref 62–150)
LV sys vol: 21 mL
Nuc Stress EF: 65 %
Peak HR: 111 {beats}/min
Rest HR: 78 {beats}/min
Rest Nuclear Isotope Dose: 10.4 mCi
SDS: 0
SRS: 0
SSS: 0
ST Depression (mm): 0 mm
Stress Nuclear Isotope Dose: 32.1 mCi
TID: 1.01

## 2021-08-15 MED ORDER — TECHNETIUM TC 99M TETROFOSMIN IV KIT
32.1000 | PACK | Freq: Once | INTRAVENOUS | Status: AC | PRN
  Administered 2021-08-15: 32.1 via INTRAVENOUS

## 2021-08-15 MED ORDER — REGADENOSON 0.4 MG/5ML IV SOLN
0.4000 mg | Freq: Once | INTRAVENOUS | Status: AC
  Administered 2021-08-15: 0.4 mg via INTRAVENOUS

## 2021-08-15 MED ORDER — TECHNETIUM TC 99M TETROFOSMIN IV KIT
10.4000 | PACK | Freq: Once | INTRAVENOUS | Status: AC | PRN
  Administered 2021-08-15: 10.4 via INTRAVENOUS

## 2021-08-16 ENCOUNTER — Encounter: Payer: Self-pay | Admitting: Internal Medicine

## 2021-08-16 ENCOUNTER — Ambulatory Visit: Payer: Medicare PPO | Admitting: Internal Medicine

## 2021-08-16 ENCOUNTER — Other Ambulatory Visit: Payer: Self-pay

## 2021-08-16 DIAGNOSIS — R7881 Bacteremia: Secondary | ICD-10-CM | POA: Diagnosis not present

## 2021-08-16 NOTE — Progress Notes (Signed)
[facility] for Infectious Disease      Reason for Consult: biliary drain-related infection, bacteremia    Referring Physician: Dr. Laurann Montana    Patient ID: Casey Radon., male    DOB: 11-03-1955, 66 y.o.   MRN: [id number]  HPI:   He is here for evaluation of a positive blood culture with Strep anginosis and Bacteroides. He has pancreatic cancer with a biliary stent that was placed in January 2023 and on chemotherapy who was admitted to the hospital earlier this month with fever and bacteremia.  He was stabilized and sent out on Augmentin and after discharge, his blood cultures grew out above findings.  Linezolid was added and he is now here as a new patient to follow up on the infection.  Finished chemotherapy 2 weeks ago  Past Medical History:  Diagnosis Date   Atrial fibrillation ([place])    Coronary artery disease    NSTEMI 12/1998 - LHC:  prox 85-90, RCA prox 30-50, EF 65-70 >> IWP:YKDX and BMS to LAD; large Dx jailed by stent (90%), EF 60  // Myoview 7/18: normal perfusion, Low Risk   History of echocardiogram    Echo 7/18: mild LVH, EF 60-65, no RWMA, mild MR, mod LAE   Myocardial infarction [facility])    Pancreatic cancer ([place])    Stroke ([place]) 04/2014    Prior to Admission medications   Medication Sig Start Date End Date Taking? Authorizing Provider  apixaban (ELIQUIS) 5 MG TABS tablet Take 1 tablet (5 mg total) by mouth 2 (two) times daily. 05/23/21   Deboraha Sprang, MD  famotidine (PEPCID) 20 MG tablet Take 20 mg by mouth at bedtime as needed for heartburn or indigestion.    [provider]  lidocaine-prilocaine (EMLA) cream Apply to affected area once Patient taking differently: Apply 1 application. topically daily as needed. Apply to affected area 04/11/21   Truitt Merle, MD  linezolid (ZYVOX) 600 MG tablet Take 1 tablet (600 mg total) by mouth 2 (two) times daily for 14 days. 08/09/21 08/23/21  Vu, Rockey Situ, MD  loratadine (CLARITIN) 10 MG tablet Take 10 mg by mouth  daily as needed for allergies (Take after Chemo treatments).    [provider]  Multiple Vitamin (MULTI-VITAMIN DAILY PO) Take 1 tablet by mouth daily.    [provider]  Omega-3 Fatty Acids (FISH OIL PO) Take 1 tablet by mouth daily.    [provider]  prochlorperazine (COMPAZINE) 10 MG tablet Take 1 tablet (10 mg total) by mouth every 6 (six) hours as needed (Nausea or vomiting). Patient taking differently: Take 10 mg by mouth every 6 (six) hours as needed for nausea or vomiting. 07/12/21   Truitt Merle, MD    No Known Allergies  Social History   Tobacco Use   Smoking status: Never   Smokeless tobacco: Former    Types: Chew    Quit date: 03/20/1999   Tobacco comments:    quit chewing 7 years ago  Vaping Use   Vaping Use: Never used  Substance Use Topics   Alcohol use: No    Alcohol/week: 0.0 standard drinks   Drug use: No    Family History  Problem Relation Age of Onset   Cancer Father 70       tongue/neck   Heart attack Maternal Uncle    Cancer Paternal Uncle        unk type     Review of Systems  Constitutional: negative for  fevers, chills, and malaise Gastrointestinal: negative for nausea and diarrhea Integument/breast: negative for rash All other systems reviewed and are negative    Constitutional: in no apparent distress There were no vitals filed for this visit. EYES: anicteric Respiratory: normal respiratory effort   Labs: Lab Results  Component Value Date   WBC 11.7 (H) 08/11/2021   HGB 11.4 (L) 08/11/2021   HCT 33.0 (L) 08/11/2021   MCV 99.7 08/11/2021   PLT 54 (L) 08/11/2021    Lab Results  Component Value Date   CREATININE 0.48 (L) 08/11/2021   BUN 8 08/11/2021   NA 134 (L) 08/11/2021   K 3.7 08/11/2021   CL 101 08/11/2021   CO2 28 08/11/2021    Lab Results  Component Value Date   ALT 104 (H) 08/11/2021   AST 37 08/11/2021   ALKPHOS 138 (H) 08/11/2021   BILITOT 1.3 (H) 08/11/2021   INR 1.2 08/05/2021      Assessment: bacteremia with STrep anginosis and Bacteroides.  No concerns now with no fever, no chills.  No nausea with the medication.   Plan: 1)  check CBC, CMP including platelets 2) repeat 1 blood culture to be sure Strep does not grow again.    Rtc as needed

## 2021-08-17 ENCOUNTER — Encounter: Payer: Self-pay | Admitting: Hematology

## 2021-08-22 ENCOUNTER — Encounter: Payer: Self-pay | Admitting: Hematology

## 2021-08-22 DIAGNOSIS — R1031 Right lower quadrant pain: Secondary | ICD-10-CM | POA: Diagnosis not present

## 2021-08-22 DIAGNOSIS — I4821 Permanent atrial fibrillation: Secondary | ICD-10-CM | POA: Diagnosis not present

## 2021-08-22 DIAGNOSIS — C259 Malignant neoplasm of pancreas, unspecified: Secondary | ICD-10-CM | POA: Diagnosis not present

## 2021-08-22 DIAGNOSIS — D6869 Other thrombophilia: Secondary | ICD-10-CM | POA: Diagnosis not present

## 2021-08-22 DIAGNOSIS — R630 Anorexia: Secondary | ICD-10-CM | POA: Diagnosis not present

## 2021-08-22 LAB — COMPREHENSIVE METABOLIC PANEL
AG Ratio: 1.5 (calc) (ref 1.0–2.5)
ALT: 54 U/L — ABNORMAL HIGH (ref 9–46)
AST: 27 U/L (ref 10–35)
Albumin: 3.8 g/dL (ref 3.6–5.1)
Alkaline phosphatase (APISO): 106 U/L (ref 35–144)
BUN/Creatinine Ratio: 12 (calc) (ref 6–22)
BUN: 7 mg/dL (ref 7–25)
CO2: 24 mmol/L (ref 20–32)
Calcium: 9.4 mg/dL (ref 8.6–10.3)
Chloride: 101 mmol/L (ref 98–110)
Creat: 0.58 mg/dL — ABNORMAL LOW (ref 0.70–1.35)
Globulin: 2.6 g/dL (calc) (ref 1.9–3.7)
Glucose, Bld: 159 mg/dL — ABNORMAL HIGH (ref 65–99)
Potassium: 4.1 mmol/L (ref 3.5–5.3)
Sodium: 137 mmol/L (ref 135–146)
Total Bilirubin: 1.2 mg/dL (ref 0.2–1.2)
Total Protein: 6.4 g/dL (ref 6.1–8.1)

## 2021-08-22 LAB — CULTURE, BLOOD (SINGLE)
MICRO NUMBER:: 13466045
Result:: NO GROWTH
SPECIMEN QUALITY:: ADEQUATE

## 2021-08-22 LAB — CBC WITH DIFFERENTIAL/PLATELET
Absolute Monocytes: 330 cells/uL (ref 200–950)
Basophils Absolute: 22 cells/uL (ref 0–200)
Basophils Relative: 0.4 %
Eosinophils Absolute: 22 cells/uL (ref 15–500)
Eosinophils Relative: 0.4 %
HCT: 39.8 % (ref 38.5–50.0)
Hemoglobin: 13.4 g/dL (ref 13.2–17.1)
Lymphs Abs: 743 cells/uL — ABNORMAL LOW (ref 850–3900)
MCH: 35.2 pg — ABNORMAL HIGH (ref 27.0–33.0)
MCHC: 33.7 g/dL (ref 32.0–36.0)
MCV: 104.5 fL — ABNORMAL HIGH (ref 80.0–100.0)
MPV: 12.6 fL — ABNORMAL HIGH (ref 7.5–12.5)
Monocytes Relative: 6 %
Neutro Abs: 4384 cells/uL (ref 1500–7800)
Neutrophils Relative %: 79.7 %
Platelets: 64 10*3/uL — ABNORMAL LOW (ref 140–400)
RBC: 3.81 10*6/uL — ABNORMAL LOW (ref 4.20–5.80)
RDW: 14.8 % (ref 11.0–15.0)
Total Lymphocyte: 13.5 %
WBC: 5.5 10*3/uL (ref 3.8–10.8)

## 2021-08-23 ENCOUNTER — Other Ambulatory Visit: Payer: Self-pay

## 2021-08-23 ENCOUNTER — Other Ambulatory Visit: Payer: Self-pay | Admitting: Internal Medicine

## 2021-08-23 ENCOUNTER — Inpatient Hospital Stay: Payer: Medicare PPO | Attending: Hematology and Oncology | Admitting: Oncology

## 2021-08-23 ENCOUNTER — Inpatient Hospital Stay: Payer: Medicare PPO

## 2021-08-23 VITALS — BP 112/71 | HR 115 | Temp 98.9°F | Resp 16 | Wt 147.9 lb

## 2021-08-23 DIAGNOSIS — R109 Unspecified abdominal pain: Secondary | ICD-10-CM | POA: Diagnosis not present

## 2021-08-23 DIAGNOSIS — D6959 Other secondary thrombocytopenia: Secondary | ICD-10-CM | POA: Diagnosis not present

## 2021-08-23 DIAGNOSIS — Z8673 Personal history of transient ischemic attack (TIA), and cerebral infarction without residual deficits: Secondary | ICD-10-CM

## 2021-08-23 DIAGNOSIS — R630 Anorexia: Secondary | ICD-10-CM | POA: Insufficient documentation

## 2021-08-23 DIAGNOSIS — C250 Malignant neoplasm of head of pancreas: Secondary | ICD-10-CM | POA: Insufficient documentation

## 2021-08-23 DIAGNOSIS — R634 Abnormal weight loss: Secondary | ICD-10-CM

## 2021-08-23 DIAGNOSIS — I4891 Unspecified atrial fibrillation: Secondary | ICD-10-CM | POA: Insufficient documentation

## 2021-08-23 DIAGNOSIS — D6481 Anemia due to antineoplastic chemotherapy: Secondary | ICD-10-CM | POA: Insufficient documentation

## 2021-08-23 DIAGNOSIS — R200 Anesthesia of skin: Secondary | ICD-10-CM | POA: Insufficient documentation

## 2021-08-23 DIAGNOSIS — I480 Paroxysmal atrial fibrillation: Secondary | ICD-10-CM

## 2021-08-23 LAB — CBC WITH DIFFERENTIAL (CANCER CENTER ONLY)
Abs Immature Granulocytes: 0.04 10*3/uL (ref 0.00–0.07)
Basophils Absolute: 0 10*3/uL (ref 0.0–0.1)
Basophils Relative: 0 %
Eosinophils Absolute: 0.1 10*3/uL (ref 0.0–0.5)
Eosinophils Relative: 1 %
HCT: 30.6 % — ABNORMAL LOW (ref 39.0–52.0)
Hemoglobin: 10.7 g/dL — ABNORMAL LOW (ref 13.0–17.0)
Immature Granulocytes: 1 %
Lymphocytes Relative: 10 %
Lymphs Abs: 0.9 10*3/uL (ref 0.7–4.0)
MCH: 35.2 pg — ABNORMAL HIGH (ref 26.0–34.0)
MCHC: 35 g/dL (ref 30.0–36.0)
MCV: 100.7 fL — ABNORMAL HIGH (ref 80.0–100.0)
Monocytes Absolute: 1.3 10*3/uL — ABNORMAL HIGH (ref 0.1–1.0)
Monocytes Relative: 15 %
Neutro Abs: 6.5 10*3/uL (ref 1.7–7.7)
Neutrophils Relative %: 73 %
Platelet Count: 42 10*3/uL — ABNORMAL LOW (ref 150–400)
RBC: 3.04 MIL/uL — ABNORMAL LOW (ref 4.22–5.81)
RDW: 14.8 % (ref 11.5–15.5)
WBC Count: 8.8 10*3/uL (ref 4.0–10.5)
nRBC: 0 % (ref 0.0–0.2)

## 2021-08-23 LAB — CMP (CANCER CENTER ONLY)
ALT: 22 U/L (ref 0–44)
AST: 16 U/L (ref 15–41)
Albumin: 3.4 g/dL — ABNORMAL LOW (ref 3.5–5.0)
Alkaline Phosphatase: 69 U/L (ref 38–126)
Anion gap: 6 (ref 5–15)
BUN: 15 mg/dL (ref 8–23)
CO2: 32 mmol/L (ref 22–32)
Calcium: 9.5 mg/dL (ref 8.9–10.3)
Chloride: 94 mmol/L — ABNORMAL LOW (ref 98–111)
Creatinine: 0.44 mg/dL — ABNORMAL LOW (ref 0.61–1.24)
GFR, Estimated: >60 mL/min (ref >60)
Glucose, Bld: 200 mg/dL — ABNORMAL HIGH (ref 70–99)
Potassium: 3.7 mmol/L (ref 3.5–5.1)
Sodium: 132 mmol/L — ABNORMAL LOW (ref 135–145)
Total Bilirubin: 1 mg/dL (ref 0.3–1.2)
Total Protein: 6.4 g/dL — ABNORMAL LOW (ref 6.5–8.1)

## 2021-08-23 MED ORDER — SODIUM CHLORIDE 0.9% FLUSH
10.0000 mL | Freq: Once | INTRAVENOUS | Status: AC
  Administered 2021-08-23: 10 mL

## 2021-08-23 MED ORDER — PROCHLORPERAZINE MALEATE 10 MG PO TABS: 10.0000 mg | ORAL_TABLET | Freq: Four times a day (QID) | ORAL | 1 refills | Status: AC | PRN

## 2021-08-23 MED ORDER — MIRTAZAPINE 7.5 MG PO TABS: 7.5000 mg | ORAL_TABLET | Freq: Every day | ORAL | 1 refills | Status: DC

## 2021-08-23 NOTE — Progress Notes (Signed)
[place]   [phone] [phone]   Clinic Follow up Note   Patient Care Team: Lavone Orn, MD as PCP - General (Internal Medicine) Deboraha Sprang, MD as PCP - Cardiology (Cardiology) Truitt Merle, MD as Consulting Physician (Oncology) Royston Bake, RN as Oncology Nurse Navigator (Oncology)  Date of Service:  08/23/2021  CHIEF COMPLAINT: f/u of pancreatic cancer  CURRENT THERAPY:  Neoadjuvant FOLFIRINOX q2 weeks, starting 04/19/21. Irinotecan held with C1 due to hyperbilirubinemia  ASSESSMENT & PLAN:  Casey Hereford. is a 66 y.o. male with   1. Pancreatic adenocarcinoma, Stage IIB, p(T2 N1)M0 -presented with painless obstructive jaundice in 02/2021. RUQ ultrasound and subsequently MRI of the abdomen 03/07/21 showed a 3.3 cm mass in pancreatic head causing obstruction of the distal bile duct resulting in hepatic biliary duct dilation. -ERCP and EUS with stent placement performed on 03/28/21, showed 2.5 cm mass, no evidence of invasion. FNA of the pancreatic head lesion showed malignant cells consistent with adenocarcinoma. -he was seen by Dr. Barry Dienes on 04/10/21, who recommend neoadjuvant chemo followed by Whipple procedure. -staging chest CT 04/17/21 showed nonspecific small (4 mm or less) pulmonary nodules.  -port placed 04/18/21. He started neoadjuvant FOLFIRINOX on 04/19/21. Irinotecan was held due to elevated tbili -his CA 19-9 has decreased lately  -restaging CT CAP on 07/05/21 showed overall positive response to therapy, with no discernible residual pancreatic head mass and no definite signs of metastatic disease.  -he met back with Dr. Barry Dienes yesterday, 07/25/21, and is being scheduled for surgery. - Completed 8 cycles of FOLFIRINOX on 08/02/2021.   2. Genetics -testing on 05/01/21 was negative.   3. Comorbidities -Patient has history of CVA in 2016 and A-fib.  Denies bleeding on Eliquis -The patient has some numbness in his right palm, but is  functional with his activities of daily living without any significant debility. -Denies new/worsening neuropathy  4.  Sepsis/abdominal pain -Patient was hospitalized from 08/05/2021 - 08/07/2021 for sepsis and abdominal pain.  Source thought to be secondary to biliary drain occlusion.  Had ERCP with new stent placement with improvement of his pain and labs. -Presented with fever of 102.6 and atrial fibrillation with RVR with heart rate of 130.  White count 42.3 and platelet count of 95,000.  Lactate 3.9. -He was given broad-spectrum antibiotics with IV vancomycin Flagyl and cefepime.  Blood cultures positive for strep anginosus and bacteroides.  Followed by infectious disease Dr. Linus Salmons.  Repeat blood culture from 08/16/2021 was negative -He was discharged on antibiotics which she completed on Monday.  5.  Weight loss/decrease in appetite -Has lost 5 pounds in 1 week. -Has already met with a dietitian -Using Compazine prior to meals for nausea. -Eating 2-3 boost daily along with high-protein meals. -Drinking fluids -Discussed starting an appetite stimulant.  Recommend mirtazapine 7.5 mg at bedtime to help with sleep and to help with his appetite.  Briefly discussed short course of steroids but given elevated blood sugar will hold off for now.  Megace is also an option although he is relatively inactive which would increase his risk for blood clots.  6.  Anemia/thrombocytopenia- -Secondary to chemo and recent infection. -We will monitor labs weekly.  PLAN: -Start mirtazapine 7.5 mg at bedtime. -Continue to push fluids and high-protein meals. -Recommend follow-up in 1 week.     No problem-specific Assessment & Plan notes found for this encounter.   SUMMARY OF ONCOLOGIC HISTORY: Oncology History  Pancreatic cancer ([place])  03/03/2021  Imaging   US Abdomen Limited RUQ IMPRESSION: Probable fatty infiltration of liver as above.   Distended gallbladder without definite stones or wall  thickening.   Borderline CBD dilatation, CBD 7 mm diameter.   03/15/2021 Imaging   MR ABDOMEN MRCP W WO CONTAST   IMPRESSION: 1. Enhancing 2.6 x 2.2 x 3.3 cm mass in the head of the pancreas causing obstruction of the distal common bile duct resulting in intra and extrahepatic biliary ductal dilatation. Findings are highly concerning for primary pancreatic neoplasm. Further evaluation with endoscopic ultrasound is strongly recommended in the near future. 2. No definite signs of metastatic disease identified in the abdomen.    03/28/2021 Procedure   DG ERCP WITH SPHINCTEROTOMY  Performed by Dr. Watt Climes  FINDINGS: A total of 4 fluoroscopic spot images taken during ERCP are submitted for review. Initial images demonstrate scope overlying the upper abdomen with wire catheterization of the common bile duct. Contrast opacification of the common bile duct demonstrates a dilated appearance with abrupt shouldering compatible with obstruction. A metal CBD stent is then placed, and appears in grossly satisfactory position.   IMPRESSION: Fluoroscopic spot images taken during ERCP with metallic stent deployment as described. Refer to dedicated procedure report for full details.   03/28/2021 Procedure   UPPER ENDOSCOPIC ULTRASOUND  - There was dilation in the common bile duct which measured up to 15 mm. - A few abnormal lymph nodes were visualized in the porta hepatis region. - A mass was identified in the pancreatic head. This was staged T3 N1 Mx by endosonographic criteria. Fine needle aspiration performed. - There was no evidence of significant pathology in the left lobe of the liver.  Findings: There was dilation in the common bile duct which measured up to 15 mm. A few abnormal lymph nodes were visualized in the porta hepatis region. The nodes were hypoechoic and had poorly defined margins. An irregular mass was identified in the pancreatic head. The mass was hypoechoic. The mass  measured 25 mm by 20 mm in maximal cross-sectional diameter. The endosonographic borders were poorlydefined. An intact interface was seen between the mass and the superior mesenteric artery, portal vein, superior mesenteric vein and splenic vein suggesting a lack of invasion. The remainder of the pancreas was examined. The endosonographic appearance of parenchyma and the upstream pancreatic duct indicated duct dilation. Fine needle aspiration for cytology was performed. Color Doppler imaging was utilized prior to needle puncture to confirm a lack of significant vascular structures within the needle path. Three passes were made with the 25 gauge needle using a transduodenal approach. A stylet was used. A cytotechnologist was present to evaluate the adequacy of the specimen. Preliminary cytology is suspicious for adenocarcinoma (final results are pending).   03/28/2021 Pathology Results   CYTOLOGY - NON PAP  CASE: [id number]  PATIENT: Casey Pierce  Non-Gynecological Cytology Report   Clinical History: Obstructive jaudice  Specimen Submitted:  A. PANCREAS, HEAD, FINE NEEDLE ASPIRATION:    FINAL MICROSCOPIC DIAGNOSIS:  - Malignant cells consistent with adenocarcinoma    03/28/2021 Cancer Staging   Staging form: Exocrine Pancreas, AJCC 8th Edition - Clinical stage from 03/28/2021: Stage IIB (cT2, cN1, cM0) - Signed by Truitt Merle, MD on 04/06/2021 Stage prefix: Initial diagnosis    04/05/2021 Initial Diagnosis   Pancreatic cancer ([place])   04/19/2021 -  Chemotherapy   Patient is on Treatment Plan : PANCREAS Modified FOLFIRINOX q14d x 4 cycles      05/01/2021 Genetic Testing   Negative genetic  testing. No pathogenic variants identified on the [facility] Multi-Cancer+RNA panel. The report date is 05/14/2021.  The Multi-Cancer Panel + RNA offered by [facility] includes sequencing and/or deletion duplication testing of the following 84 genes: AIP, ALK, APC, ATM, AXIN2,BAP1,  BARD1, BLM, BMPR1A, BRCA1,  BRCA2, BRIP1, CASR, CDC73, CDH1, CDK4, CDKN1B, CDKN1C, CDKN2A (p14ARF), CDKN2A (p16INK4a), CEBPA, CHEK2, CTNNA1, DICER1, DIS3L2, EGFR (c.2369C>T, p.Thr790Met variant only), EPCAM (Deletion/duplication testing only), FH, FLCN, GATA2, GPC3, GREM1 (Promoter region deletion/duplication testing only), HOXB13 (c.251G>A, p.Gly84Glu), HRAS, KIT, MAX, MEN1, MET, MITF (c.952G>A, p.Glu318Lys variant only), MLH1, MSH2, MSH3, MSH6, MUTYH, NBN, NF1, NF2, NTHL1, PALB2, PDGFRA, PHOX2B, PMS2, POLD1, POLE, POT1, PRKAR1A, PTCH1, PTEN, RAD50, RAD51C, RAD51D, RB1, RECQL4, RET, RUNX1, SDHAF2, SDHA (sequence changes only), SDHB, SDHC, SDHD, SMAD4, SMARCA4, SMARCB1, SMARCE1, STK11, SUFU, TERC, TERT, TMEM127, TP53, TSC1, TSC2, VHL, WRN and WT1.   07/05/2021 Imaging   EXAM: CT CHEST WITHOUT, CT ABDOMEN AND PELVIS WITH AND WITHOUT CONTRAST  IMPRESSION: 1. Today's study demonstrates positive response to therapy with no discernible residual pancreatic head mass confidently identified on today's examination. Common bile duct stent appears appropriately located, and there is no residual intrahepatic biliary ductal dilatation. 2. No definite signs of metastatic disease in the chest, abdomen or pelvis. Specifically, previously noted small pulmonary nodules measuring 4 mm or less in size are stable in number and size, favored to be benign. 3. Short segment mural thickening and luminal narrowing in the proximal ascending colon. The possibility of a primary colonic neoplasm warrants consideration, and further evaluation with nonemergent colonoscopy is recommended in the near future to better evaluate this finding. 4. Aortic atherosclerosis, in addition to left main and three-vessel coronary artery disease. Please note that although the presence of coronary artery calcium documents the presence of coronary artery disease, the severity of this disease and any potential stenosis cannot be assessed on this non-gated CT examination.  Assessment for potential risk factor modification, dietary therapy or pharmacologic therapy may be warranted, if clinically indicated. 5. There are calcifications of the aortic valve. Echocardiographic correlation for evaluation of potential valvular dysfunction may be warranted if clinically indicated. 6. Similar areas of interstitial prominence throughout the lungs bilaterally, which could suggest early or mild interstitial lung disease. Continued attention on follow-up imaging is recommended. Outpatient referral to Pulmonology for further clinical evaluation should also be considered if clinically appropriate.      INTERVAL HISTORY:  Casey Pierce. is here for a follow up of pancreatic cancer. He was last seen in clinic on 07/26/21 prior to cycle 8.  He presents today with his wife.  He has completed 8 cycles of chemotherapy.  Plan is for surgery with Dr. Barry Dienes in July.  He was recently hospitalized for sepsis and abdominal pain.  He was found to have biliary occlusion and stent was replaced with resolution of his symptoms.  He was treated with broad-spectrum antibiotics and discharged on oral antibiotics which she completed on Monday.  Symptoms have improved although now he is feeling significantly weak, fatigued and low appetite.  His wife states she is pushing supplements but he does not have a taste for anything.  He denies any significant pain.  Feels he is hydrating adequately.   All other systems were reviewed with the patient and are negative.  MEDICAL HISTORY:  Past Medical History:  Diagnosis Date   Atrial fibrillation ([place])    Coronary artery disease    NSTEMI 12/1998 - LHC:  prox 85-90, RCA prox 30-50, EF 65-70 >>  PCI:PTCA and BMS to LAD; large Dx jailed by stent (90%), EF 72  // Myoview 7/18: normal perfusion, Low Risk   History of echocardiogram    Echo 7/18: mild LVH, EF 60-65, no RWMA, mild MR, mod LAE   Myocardial infarction [facility])    Pancreatic cancer ([place])     Stroke ([place]) 04/2014    SURGICAL HISTORY: Past Surgical History:  Procedure Laterality Date   BILIARY STENT PLACEMENT N/A 03/28/2021   Procedure: BILIARY STENT PLACEMENT;  Surgeon: Clarene Essex, MD;  Location: [facility];  Service: Endoscopy;  Laterality: N/A;   BILIARY STENT PLACEMENT N/A 08/06/2021   Procedure: BILIARY STENT PLACEMENT;  Surgeon: Gatha Mayer, MD;  Location: [facility];  Service: Gastroenterology;  Laterality: N/A;   CARDIOVERSION N/A 06/20/2015   Procedure: CARDIOVERSION;  Surgeon: [name], MD;  Location: [facility];  Service: Cardiovascular;  Laterality: N/A;   COLONOSCOPY WITH PROPOFOL N/A 02/07/2015   Procedure: COLONOSCOPY WITH PROPOFOL;  Surgeon: [name], MD;  Location: [facility];  Service: Endoscopy;  Laterality: N/A;   CORONARY STENT PLACEMENT  2001   ERCP N/A 03/28/2021   Procedure: ENDOSCOPIC RETROGRADE CHOLANGIOPANCREATOGRAPHY (ERCP);  Surgeon: Clarene Essex, MD;  Location: [facility];  Service: Endoscopy;  Laterality: N/A;   ERCP N/A 08/06/2021   Procedure: ENDOSCOPIC RETROGRADE CHOLANGIOPANCREATOGRAPHY (ERCP);  Surgeon: Gatha Mayer, MD;  Location: [facility];  Service: Gastroenterology;  Laterality: N/A;   ESOPHAGOGASTRODUODENOSCOPY N/A 03/28/2021   Procedure: ESOPHAGOGASTRODUODENOSCOPY (EGD);  Surgeon: Arta Silence, MD;  Location: [facility];  Service: Gastroenterology;  Laterality: N/A;   EUS N/A 03/28/2021   Procedure: FULL UPPER ENDOSCOPIC ULTRASOUND (EUS) RADIAL;  Surgeon: Arta Silence, MD;  Location: [facility];  Service: Gastroenterology;  Laterality: N/A;   FINE NEEDLE ASPIRATION N/A 03/28/2021   Procedure: FINE NEEDLE ASPIRATION (FNA) LINEAR;  Surgeon: Arta Silence, MD;  Location: [facility];  Service: Gastroenterology;  Laterality: N/A;   KNEE SURGERY  1972   PORTACATH PLACEMENT N/A 04/18/2021   Procedure: PORT PLACEMENT;  Surgeon: Stark Klein, MD;  Location: [facility];  Service: General;   Laterality: N/A;   SPHINCTEROTOMY  03/28/2021   Procedure: SPHINCTEROTOMY;  Surgeon: Clarene Essex, MD;  Location: [facility];  Service: Endoscopy;;    I have reviewed the social history and family history with the patient and they are unchanged from previous note.  ALLERGIES:  has No Known Allergies.  MEDICATIONS:  Current Outpatient Medications  Medication Sig Dispense Refill   ELIQUIS 5 MG TABS tablet TAKE 1 TABLET BY MOUTH TWICE A DAY 180 tablet 1   famotidine (PEPCID) 20 MG tablet Take 20 mg by mouth at bedtime as needed for heartburn or indigestion.     lidocaine-prilocaine (EMLA) cream Apply to affected area once (Patient taking differently: Apply 1 application. topically daily as needed. Apply to affected area) 30 g 3   linezolid (ZYVOX) 600 MG tablet Take 1 tablet (600 mg total) by mouth 2 (two) times daily for 14 days. 28 tablet 0   loratadine (CLARITIN) 10 MG tablet Take 10 mg by mouth daily as needed for allergies (Take after Chemo treatments).     Multiple Vitamin (MULTI-VITAMIN DAILY PO) Take 1 tablet by mouth daily.     Omega-3 Fatty Acids (FISH OIL PO) Take 1 tablet by mouth daily.     prochlorperazine (COMPAZINE) 10 MG tablet Take 1 tablet (10 mg total) by mouth every 6 (six) hours as needed (Nausea or vomiting). (Patient taking differently: Take 10 mg by  mouth every 6 (six) hours as needed for nausea or vomiting.) 30 tablet 1   No current facility-administered medications for this visit.    PHYSICAL EXAMINATION: ECOG PERFORMANCE STATUS: 1 - Symptomatic but completely ambulatory  There were no vitals filed for this visit.  Wt Readings from Last 3 Encounters:  08/16/21 152 lb (68.9 kg)  08/15/21 155 lb (70.3 kg)  08/02/21 155 lb (70.3 kg)     Physical Exam Constitutional:      Appearance: Normal appearance.  Cardiovascular:     Rate and Rhythm: Normal rate and regular rhythm.  Pulmonary:     Effort: Pulmonary effort is normal.     Breath sounds: Normal breath  sounds.  Skin:    General: Skin is warm.  Neurological:     Mental Status: He is alert and oriented to person, place, and time.    LABORATORY DATA:  I have reviewed the data as listed    Latest Ref Rng & Units 08/23/2021   10:11 AM 08/16/2021    8:57 AM 08/11/2021   10:45 AM  CBC  WBC 4.0 - 10.5 K/uL 8.8   5.5   11.7    Hemoglobin 13.0 - 17.0 g/dL 10.7   13.4   11.4    Hematocrit 39.0 - 52.0 % 30.6   39.8   33.0    Platelets 150 - 400 K/uL 42   64   54          Latest Ref Rng & Units 08/16/2021    8:57 AM 08/11/2021   10:45 AM 08/07/2021    2:46 AM  CMP  Glucose 65 - 99 mg/dL 159   142   158    BUN 7 - 25 mg/dL 7   8   15     Creatinine 0.70 - 1.35 mg/dL 0.58   0.48   0.55    Sodium 135 - 146 mmol/L 137   134   136    Potassium 3.5 - 5.3 mmol/L 4.1   3.7   3.9    Chloride 98 - 110 mmol/L 101   101   106    CO2 20 - 32 mmol/L 24   28   23     Calcium 8.6 - 10.3 mg/dL 9.4   9.0   8.5    Total Protein 6.1 - 8.1 g/dL 6.4   6.1   5.4    Total Bilirubin 0.2 - 1.2 mg/dL 1.2   1.3   4.3    Alkaline Phos 38 - 126 U/L  138   121    AST 10 - 35 U/L 27   37   141    ALT 9 - 46 U/L 54   104   250        RADIOGRAPHIC STUDIES: I have personally reviewed the radiological images as listed and agreed with the findings in the report. No results found.    No orders of the defined types were placed in this encounter.  All questions were answered. The patient knows to call the clinic with any problems, questions or concerns. No barriers to learning was detected.  I spent 25 minutes dedicated to the care of this patient (face-to-face and non-face-to-face) on the date of the encounter to include what is described in the assessment and plan.     Jacquelin Hawking, NP 08/23/2021

## 2021-08-23 NOTE — Telephone Encounter (Signed)
Eliquis '5mg'$  refill request received. Patient is 66 years old, weight-68.9kg, Crea-0.58 on 08/16/2021, Diagnosis-Afib, and last seen by Dr. Tamala Julian on 07/28/2021. Dose is appropriate based on dosing criteria. Will send in refill to requested pharmacy.

## 2021-08-24 LAB — CANCER ANTIGEN 19-9: CA 19-9: 116 U/mL — ABNORMAL HIGH (ref 0–35)

## 2021-08-28 ENCOUNTER — Other Ambulatory Visit: Payer: Self-pay

## 2021-08-28 ENCOUNTER — Inpatient Hospital Stay: Payer: Medicare PPO

## 2021-08-28 ENCOUNTER — Encounter: Payer: Self-pay | Admitting: Hematology

## 2021-08-28 VITALS — BP 115/60 | HR 101 | Temp 98.0°F | Resp 18

## 2021-08-28 DIAGNOSIS — C250 Malignant neoplasm of head of pancreas: Secondary | ICD-10-CM

## 2021-08-28 DIAGNOSIS — E860 Dehydration: Secondary | ICD-10-CM

## 2021-08-28 DIAGNOSIS — R630 Anorexia: Secondary | ICD-10-CM | POA: Diagnosis not present

## 2021-08-28 DIAGNOSIS — D6481 Anemia due to antineoplastic chemotherapy: Secondary | ICD-10-CM | POA: Diagnosis not present

## 2021-08-28 DIAGNOSIS — Z8673 Personal history of transient ischemic attack (TIA), and cerebral infarction without residual deficits: Secondary | ICD-10-CM | POA: Diagnosis not present

## 2021-08-28 DIAGNOSIS — I4891 Unspecified atrial fibrillation: Secondary | ICD-10-CM | POA: Diagnosis not present

## 2021-08-28 DIAGNOSIS — D6959 Other secondary thrombocytopenia: Secondary | ICD-10-CM | POA: Diagnosis not present

## 2021-08-28 DIAGNOSIS — R200 Anesthesia of skin: Secondary | ICD-10-CM | POA: Diagnosis not present

## 2021-08-28 DIAGNOSIS — R109 Unspecified abdominal pain: Secondary | ICD-10-CM | POA: Diagnosis not present

## 2021-08-28 LAB — CMP (CANCER CENTER ONLY)
ALT: 50 U/L — ABNORMAL HIGH (ref 0–44)
AST: 32 U/L (ref 15–41)
Albumin: 3.1 g/dL — ABNORMAL LOW (ref 3.5–5.0)
Alkaline Phosphatase: 66 U/L (ref 38–126)
Anion gap: 4 — ABNORMAL LOW (ref 5–15)
BUN: 11 mg/dL (ref 8–23)
CO2: 31 mmol/L (ref 22–32)
Calcium: 8.9 mg/dL (ref 8.9–10.3)
Chloride: 98 mmol/L (ref 98–111)
Creatinine: 0.54 mg/dL — ABNORMAL LOW (ref 0.61–1.24)
GFR, Estimated: >60 mL/min (ref >60)
Glucose, Bld: 189 mg/dL — ABNORMAL HIGH (ref 70–99)
Potassium: 3.7 mmol/L (ref 3.5–5.1)
Sodium: 133 mmol/L — ABNORMAL LOW (ref 135–145)
Total Bilirubin: 0.6 mg/dL (ref 0.3–1.2)
Total Protein: 5.9 g/dL — ABNORMAL LOW (ref 6.5–8.1)

## 2021-08-28 LAB — CBC WITH DIFFERENTIAL (CANCER CENTER ONLY)
Abs Immature Granulocytes: 0.08 10*3/uL — ABNORMAL HIGH (ref 0.00–0.07)
Basophils Absolute: 0 10*3/uL (ref 0.0–0.1)
Basophils Relative: 0 %
Eosinophils Absolute: 0.3 10*3/uL (ref 0.0–0.5)
Eosinophils Relative: 2 %
HCT: 24.5 % — ABNORMAL LOW (ref 39.0–52.0)
Hemoglobin: 8.4 g/dL — ABNORMAL LOW (ref 13.0–17.0)
Immature Granulocytes: 1 %
Lymphocytes Relative: 11 %
Lymphs Abs: 1.2 10*3/uL (ref 0.7–4.0)
MCH: 35 pg — ABNORMAL HIGH (ref 26.0–34.0)
MCHC: 34.3 g/dL (ref 30.0–36.0)
MCV: 102.1 fL — ABNORMAL HIGH (ref 80.0–100.0)
Monocytes Absolute: 0.9 10*3/uL (ref 0.1–1.0)
Monocytes Relative: 9 %
Neutro Abs: 8.1 10*3/uL — ABNORMAL HIGH (ref 1.7–7.7)
Neutrophils Relative %: 77 %
Platelet Count: 104 10*3/uL — ABNORMAL LOW (ref 150–400)
RBC: 2.4 MIL/uL — ABNORMAL LOW (ref 4.22–5.81)
RDW: 15.1 % (ref 11.5–15.5)
WBC Count: 10.5 10*3/uL (ref 4.0–10.5)
nRBC: 0 % (ref 0.0–0.2)

## 2021-08-28 MED ORDER — SODIUM CHLORIDE 0.9 % IV SOLN: Freq: Once | INTRAVENOUS | Status: AC

## 2021-08-28 MED ORDER — SODIUM CHLORIDE 0.9% FLUSH
10.0000 mL | Freq: Once | INTRAVENOUS | Status: AC
  Administered 2021-08-28: 10 mL

## 2021-08-28 NOTE — Patient Instructions (Signed)

## 2021-08-30 ENCOUNTER — Telehealth: Payer: Self-pay | Admitting: Hematology

## 2021-08-30 ENCOUNTER — Telehealth: Payer: Self-pay

## 2021-08-30 NOTE — Telephone Encounter (Signed)
This nurse spoke with patients wife who stated that patient came in  on 6/12 and IVF and was informed that his hemoglobin is 8.4 and if his levels drop further he may need a blood transfusion.  Spouse would like to know if patient can have a lab appointment to recheck his levels.  This nurse set an appointment for the patient for 6/15 at 11 am.  Advised that patient does not have to wait after lab, once they have been reviewed by the provider they will be notified.  She acknowledged and agreed to appointment.  No further questions or concerns at this time.

## 2021-08-30 NOTE — Telephone Encounter (Signed)
Left message with follow-up appointment per 6/7 los.

## 2021-08-31 ENCOUNTER — Encounter: Payer: Self-pay | Admitting: Hematology

## 2021-08-31 ENCOUNTER — Other Ambulatory Visit: Payer: Self-pay

## 2021-08-31 ENCOUNTER — Telehealth: Payer: Self-pay

## 2021-08-31 ENCOUNTER — Inpatient Hospital Stay: Payer: Medicare PPO

## 2021-08-31 ENCOUNTER — Inpatient Hospital Stay: Payer: Medicare PPO | Admitting: Hematology

## 2021-08-31 VITALS — BP 119/78 | HR 104 | Temp 98.5°F | Resp 18 | Ht 65.0 in | Wt 147.4 lb

## 2021-08-31 DIAGNOSIS — C250 Malignant neoplasm of head of pancreas: Secondary | ICD-10-CM | POA: Diagnosis not present

## 2021-08-31 DIAGNOSIS — D6481 Anemia due to antineoplastic chemotherapy: Secondary | ICD-10-CM | POA: Diagnosis not present

## 2021-08-31 DIAGNOSIS — E860 Dehydration: Secondary | ICD-10-CM

## 2021-08-31 DIAGNOSIS — R200 Anesthesia of skin: Secondary | ICD-10-CM | POA: Diagnosis not present

## 2021-08-31 DIAGNOSIS — Z8673 Personal history of transient ischemic attack (TIA), and cerebral infarction without residual deficits: Secondary | ICD-10-CM | POA: Diagnosis not present

## 2021-08-31 DIAGNOSIS — R109 Unspecified abdominal pain: Secondary | ICD-10-CM | POA: Diagnosis not present

## 2021-08-31 DIAGNOSIS — R630 Anorexia: Secondary | ICD-10-CM | POA: Diagnosis not present

## 2021-08-31 DIAGNOSIS — D649 Anemia, unspecified: Secondary | ICD-10-CM

## 2021-08-31 DIAGNOSIS — I4891 Unspecified atrial fibrillation: Secondary | ICD-10-CM | POA: Diagnosis not present

## 2021-08-31 DIAGNOSIS — D6959 Other secondary thrombocytopenia: Secondary | ICD-10-CM | POA: Diagnosis not present

## 2021-08-31 LAB — CMP (CANCER CENTER ONLY)
ALT: 51 U/L — ABNORMAL HIGH (ref 0–44)
AST: 32 U/L (ref 15–41)
Albumin: 3 g/dL — ABNORMAL LOW (ref 3.5–5.0)
Alkaline Phosphatase: 73 U/L (ref 38–126)
Anion gap: 5 (ref 5–15)
BUN: 8 mg/dL (ref 8–23)
CO2: 28 mmol/L (ref 22–32)
Calcium: 8.9 mg/dL (ref 8.9–10.3)
Chloride: 102 mmol/L (ref 98–111)
Creatinine: 0.45 mg/dL — ABNORMAL LOW (ref 0.61–1.24)
GFR, Estimated: >60 mL/min (ref >60)
Glucose, Bld: 200 mg/dL — ABNORMAL HIGH (ref 70–99)
Potassium: 4 mmol/L (ref 3.5–5.1)
Sodium: 135 mmol/L (ref 135–145)
Total Bilirubin: 0.5 mg/dL (ref 0.3–1.2)
Total Protein: 6.1 g/dL — ABNORMAL LOW (ref 6.5–8.1)

## 2021-08-31 LAB — CBC WITH DIFFERENTIAL (CANCER CENTER ONLY)
Abs Immature Granulocytes: 0.05 10*3/uL (ref 0.00–0.07)
Basophils Absolute: 0 10*3/uL (ref 0.0–0.1)
Basophils Relative: 0 %
Eosinophils Absolute: 0.2 10*3/uL (ref 0.0–0.5)
Eosinophils Relative: 2 %
HCT: 23.8 % — ABNORMAL LOW (ref 39.0–52.0)
Hemoglobin: 8 g/dL — ABNORMAL LOW (ref 13.0–17.0)
Immature Granulocytes: 1 %
Lymphocytes Relative: 11 %
Lymphs Abs: 1 10*3/uL (ref 0.7–4.0)
MCH: 35.2 pg — ABNORMAL HIGH (ref 26.0–34.0)
MCHC: 33.6 g/dL (ref 30.0–36.0)
MCV: 104.8 fL — ABNORMAL HIGH (ref 80.0–100.0)
Monocytes Absolute: 0.7 10*3/uL (ref 0.1–1.0)
Monocytes Relative: 8 %
Neutro Abs: 6.7 10*3/uL (ref 1.7–7.7)
Neutrophils Relative %: 78 %
Platelet Count: 146 10*3/uL — ABNORMAL LOW (ref 150–400)
RBC: 2.27 MIL/uL — ABNORMAL LOW (ref 4.22–5.81)
RDW: 15.9 % — ABNORMAL HIGH (ref 11.5–15.5)
WBC Count: 8.6 10*3/uL (ref 4.0–10.5)
nRBC: 0 % (ref 0.0–0.2)

## 2021-08-31 LAB — SAMPLE TO BLOOD BANK

## 2021-08-31 LAB — PREPARE RBC (CROSSMATCH)

## 2021-08-31 LAB — ABO/RH: ABO/RH(D): AB POS

## 2021-08-31 MED ORDER — SODIUM CHLORIDE 0.9% FLUSH
10.0000 mL | Freq: Once | INTRAVENOUS | Status: AC
  Administered 2021-08-31: 10 mL

## 2021-08-31 MED ORDER — DRONABINOL 5 MG PO CAPS: 5.0000 mg | ORAL_CAPSULE | Freq: Two times a day (BID) | ORAL | 0 refills | Status: DC

## 2021-08-31 NOTE — Progress Notes (Signed)
[facility]   [phone] [phone]   Clinic Follow up Note   Patient Care Team: Lavone Orn, MD as PCP - General (Internal Medicine) Deboraha Sprang, MD as PCP - Cardiology (Cardiology) Truitt Merle, MD as Consulting Physician (Oncology) Royston Bake, RN as Oncology Nurse Navigator (Oncology)  Date of Service:  08/31/2021  CHIEF COMPLAINT: f/u of pancreatic cancer  CURRENT THERAPY:  Awaiting definitive surgery, 09/20/21  ASSESSMENT & PLAN:  Casey Pierce. is a 66 y.o. male with   1. Pancreatic adenocarcinoma, Stage IIB, c(T2 N1)M0 -presented with painless obstructive jaundice in 02/2021. RUQ ultrasound and subsequently MRI of the abdomen 03/07/21 showed a 3.3 cm mass in pancreatic head causing obstruction of the distal bile duct resulting in hepatic biliary duct dilation. -ERCP and EUS with stent placement performed on 03/28/21, showed 2.5 cm mass, no evidence of invasion. FNA of the pancreatic head lesion showed malignant cells consistent with adenocarcinoma. -he was seen by Dr. Barry Dienes on 04/10/21, who recommend neoadjuvant chemo followed by Whipple procedure. -staging chest CT 04/17/21 showed nonspecific small (4 mm or less) pulmonary nodules.  -port placed 04/18/21. He started neoadjuvant FOLFIRINOX on 04/19/21. Irinotecan was held due to elevated tbili -his CA 19-9 has decreased lately  -restaging CT CAP on 07/05/21 showed overall positive response to therapy, with no discernible residual pancreatic head mass and no definite signs of metastatic disease.  -he completed 8 cycles of FOLFIRINOX on 08/02/21. He was unfortunately hospitalized for sepsis and abdominal pain several days after infusion. He completed his antibiotics. -he reports continued difficulty recovering from chemo. He is scheduled to proceed with surgery on 09/20/21 with Dr. Barry Dienes. -labs reviewed, hgb down to 8 today. I recommend blood transfusion in the next few days due to his  significant fatigue. -I plan to see him back a month after his surgery.  We will review the pathology findings, and decide if he needs additional adjuvant chemo.   5.  Weight loss, anorexia and fatigue -he met with dietitian on 08/02/21 -he uses Compazine prior to meals for nausea. -he endorses drinking 2-3 boost daily along with high-protein meals. -he was started on mirtazapine on 08/23/21, but he does not find this helpful. He is interested in trying a different medication, so I will prescribe marinol.  I reviewed the benefit and, he agrees to proceed. -weight is stable from last week.   6.  Anemia, thrombocytopenia -secondary to chemo and recent infection. -platelets improved to 146k today -hgb down to 8. I recommend he proceed with blood transfusion in the next few days.  2. Genetics -testing on 05/01/21 was negative.   3. Comorbidities -Patient has history of CVA in 2016 and A-fib.  Denies bleeding on Eliquis -The patient has some numbness in his right palm, but is functional with his activities of daily living without any significant debility. -Denies new/worsening neuropathy    PLAN: -I called in marinol 27m bid  -blood transfusion to be done in next few days -proceed with surgery 09/20/21 with Dr. BBarry Dienes-lab and f/u week of 10/17/21   No problem-specific Assessment & Plan notes found for this encounter.   SUMMARY OF ONCOLOGIC HISTORY: Oncology History  [place]ic [place] ([place]  03/03/2021 Imaging   UKoreaAbdomen Limited RUQ IMPRESSION: Probable fatty infiltration of liver as above.   Distended gallbladder without definite stones or wall thickening.   Borderline CBD dilatation, CBD 7 mm diameter.   03/15/2021 Imaging   MR ABDOMEN MRCP W  WO CONTAST   IMPRESSION: 1. Enhancing 2.6 x 2.2 x 3.3 cm mass in the head of the pancreas causing obstruction of the distal common bile duct resulting in intra and extrahepatic biliary ductal dilatation. Findings are highly concerning  for primary pancreatic neoplasm. Further evaluation with endoscopic ultrasound is strongly recommended in the near future. 2. No definite signs of metastatic disease identified in the abdomen.    03/28/2021 Procedure   DG ERCP WITH SPHINCTEROTOMY  Performed by Dr. Watt Climes  FINDINGS: A total of 4 fluoroscopic spot images taken during ERCP are submitted for review. Initial images demonstrate scope overlying the upper abdomen with wire catheterization of the common bile duct. Contrast opacification of the common bile duct demonstrates a dilated appearance with abrupt shouldering compatible with obstruction. A metal CBD stent is then placed, and appears in grossly satisfactory position.   IMPRESSION: Fluoroscopic spot images taken during ERCP with metallic stent deployment as described. Refer to dedicated procedure report for full details.   03/28/2021 Procedure   UPPER ENDOSCOPIC ULTRASOUND  - There was dilation in the common bile duct which measured up to 15 mm. - A few abnormal lymph nodes were visualized in the porta hepatis region. - A mass was identified in the pancreatic head. This was staged T3 N1 Mx by endosonographic criteria. Fine needle aspiration performed. - There was no evidence of significant pathology in the left lobe of the liver.  Findings: There was dilation in the common bile duct which measured up to 15 mm. A few abnormal lymph nodes were visualized in the porta hepatis region. The nodes were hypoechoic and had poorly defined margins. An irregular mass was identified in the pancreatic head. The mass was hypoechoic. The mass measured 25 mm by 20 mm in maximal cross-sectional diameter. The endosonographic borders were poorlydefined. An intact interface was seen between the mass and the superior mesenteric artery, portal vein, superior mesenteric vein and splenic vein suggesting a lack of invasion. The remainder of the pancreas was examined. The endosonographic  appearance of parenchyma and the upstream pancreatic duct indicated duct dilation. Fine needle aspiration for cytology was performed. Color Doppler imaging was utilized prior to needle puncture to confirm a lack of significant vascular structures within the needle path. Three passes were made with the 25 gauge needle using a transduodenal approach. A stylet was used. A cytotechnologist was present to evaluate the adequacy of the specimen. Preliminary cytology is suspicious for adenocarcinoma (final results are pending).   03/28/2021 Pathology Results   CYTOLOGY - NON PAP  CASE: [id number]  PATIENT: Holman Kelner  Non-Gynecological Cytology Report   Clinical History: Obstructive jaudice  Specimen Submitted:  A. PANCREAS, HEAD, FINE NEEDLE ASPIRATION:    FINAL MICROSCOPIC DIAGNOSIS:  - Malignant cells consistent with adenocarcinoma    03/28/2021 Cancer Staging   Staging form: Exocrine Pancreas, AJCC 8th Edition - Clinical stage from 03/28/2021: Stage IIB (cT2, cN1, cM0) - Signed by Truitt Merle, MD on 04/06/2021 Stage prefix: Initial diagnosis   04/05/2021 Initial Diagnosis   Pancreatic cancer ([place])   04/19/2021 -  Chemotherapy   Patient is on Treatment Plan : PANCREAS Modified FOLFIRINOX q14d x 4 cycles     05/01/2021 Genetic Testing   Negative genetic testing. No pathogenic variants identified on the [facility] Multi-Cancer+RNA panel. The report date is 05/14/2021.  The Multi-Cancer Panel + RNA offered by [facility] includes sequencing and/or deletion duplication testing of the following 84 genes: AIP, ALK, APC, ATM, AXIN2,BAP1,  BARD1, BLM, BMPR1A, BRCA1, BRCA2,  BRIP1, CASR, CDC73, CDH1, CDK4, CDKN1B, CDKN1C, CDKN2A (p14ARF), CDKN2A (p16INK4a), CEBPA, CHEK2, CTNNA1, DICER1, DIS3L2, EGFR (c.2369C>T, p.Thr790Met variant only), EPCAM (Deletion/duplication testing only), FH, FLCN, GATA2, GPC3, GREM1 (Promoter region deletion/duplication testing only), HOXB13 (c.251G>A, p.Gly84Glu), HRAS, KIT, MAX,  MEN1, MET, MITF (c.952G>A, p.Glu318Lys variant only), MLH1, MSH2, MSH3, MSH6, MUTYH, NBN, NF1, NF2, NTHL1, PALB2, PDGFRA, PHOX2B, PMS2, POLD1, POLE, POT1, PRKAR1A, PTCH1, PTEN, RAD50, RAD51C, RAD51D, RB1, RECQL4, RET, RUNX1, SDHAF2, SDHA (sequence changes only), SDHB, SDHC, SDHD, SMAD4, SMARCA4, SMARCB1, SMARCE1, STK11, SUFU, TERC, TERT, TMEM127, TP53, TSC1, TSC2, VHL, WRN and WT1.   07/05/2021 Imaging   EXAM: CT CHEST WITHOUT, CT ABDOMEN AND PELVIS WITH AND WITHOUT CONTRAST  IMPRESSION: 1. Today's study demonstrates positive response to therapy with no discernible residual pancreatic head mass confidently identified on today's examination. Common bile duct stent appears appropriately located, and there is no residual intrahepatic biliary ductal dilatation. 2. No definite signs of metastatic disease in the chest, abdomen or pelvis. Specifically, previously noted small pulmonary nodules measuring 4 mm or less in size are stable in number and size, favored to be benign. 3. Short segment mural thickening and luminal narrowing in the proximal ascending colon. The possibility of a primary colonic neoplasm warrants consideration, and further evaluation with nonemergent colonoscopy is recommended in the near future to better evaluate this finding. 4. Aortic atherosclerosis, in addition to left main and three-vessel coronary artery disease. Please note that although the presence of coronary artery calcium documents the presence of coronary artery disease, the severity of this disease and any potential stenosis cannot be assessed on this non-gated CT examination. Assessment for potential risk factor modification, dietary therapy or pharmacologic therapy may be warranted, if clinically indicated. 5. There are calcifications of the aortic valve. Echocardiographic correlation for evaluation of potential valvular dysfunction may be warranted if clinically indicated. 6. Similar areas of interstitial  prominence throughout the lungs bilaterally, which could suggest early or mild interstitial lung disease. Continued attention on follow-up imaging is recommended. Outpatient referral to Pulmonology for further clinical evaluation should also be considered if clinically appropriate.      INTERVAL HISTORY:  Erlin Gardella. is here for a follow up of pancreatic cancer. He was last seen by me on NP [facility] on 08/23/21. He presents to the clinic accompanied by his wife. They report he is still having difficulty recovering from chemo. He continues to have low appetite.   All other systems were reviewed with the patient and are negative.  MEDICAL HISTORY:  Past Medical History:  Diagnosis Date   Atrial fibrillation ([place])    Coronary artery disease    NSTEMI 12/1998 - LHC:  prox 85-90, RCA prox 30-50, EF 65-70 >> AUQ:JFHL and BMS to LAD; large Dx jailed by stent (90%), EF 60  // Myoview 7/18: normal perfusion, Low Risk   History of echocardiogram    Echo 7/18: mild LVH, EF 60-65, no RWMA, mild MR, mod LAE   Myocardial infarction [facility])    Pancreatic cancer ([place])    Stroke ([place]) 04/2014    SURGICAL HISTORY: Past Surgical History:  Procedure Laterality Date   BILIARY STENT PLACEMENT N/A 03/28/2021   Procedure: BILIARY STENT PLACEMENT;  Surgeon: Clarene Essex, MD;  [facility];  Service: Endoscopy;  Laterality: N/A;   BILIARY STENT PLACEMENT N/A 08/06/2021   Procedure: BILIARY STENT PLACEMENT;  Surgeon: Gatha Mayer, MD;  [facility];  Service: Gastroenterology;  Laterality: N/A;   CARDIOVERSION N/A 06/20/2015   Procedure:  CARDIOVERSION;  Surgeon: [name], MD;  Location: [facility];  Service: Cardiovascular;  Laterality: N/A;   COLONOSCOPY WITH PROPOFOL N/A 02/07/2015   Procedure: COLONOSCOPY WITH PROPOFOL;  Surgeon: [name], MD;  [facility];  Service: Endoscopy;  Laterality: N/A;   CORONARY STENT PLACEMENT  2001   ERCP N/A 03/28/2021    Procedure: ENDOSCOPIC RETROGRADE CHOLANGIOPANCREATOGRAPHY (ERCP);  Surgeon: Clarene Essex, MD;  [facility];  Service: Endoscopy;  Laterality: N/A;   ERCP N/A 08/06/2021   Procedure: ENDOSCOPIC RETROGRADE CHOLANGIOPANCREATOGRAPHY (ERCP);  Surgeon: Gatha Mayer, MD;  Location: [facility];  Service: Gastroenterology;  Laterality: N/A;   ESOPHAGOGASTRODUODENOSCOPY N/A 03/28/2021   Procedure: ESOPHAGOGASTRODUODENOSCOPY (EGD);  Surgeon: Arta Silence, MD;  Location: [facility];  Service: Gastroenterology;  Laterality: N/A;   EUS N/A 03/28/2021   Procedure: FULL UPPER ENDOSCOPIC ULTRASOUND (EUS) RADIAL;  Surgeon: Arta Silence, MD;  [facility];  Service: Gastroenterology;  Laterality: N/A;   FINE NEEDLE ASPIRATION N/A 03/28/2021   Procedure: FINE NEEDLE ASPIRATION (FNA) LINEAR;  Surgeon: Arta Silence, MD;  [facility];  Service: Gastroenterology;  Laterality: N/A;   KNEE SURGERY  1972   PORTACATH PLACEMENT N/A 04/18/2021   Procedure: PORT PLACEMENT;  Surgeon: Stark Klein, MD;  Location: [facility];  Service: General;  Laterality: N/A;   SPHINCTEROTOMY  03/28/2021   Procedure: SPHINCTEROTOMY;  Surgeon: Clarene Essex, MD;  [facility];  Service: Endoscopy;;    I have reviewed the social history and family history with the patient and they are unchanged from previous note.  ALLERGIES:  has No Known Allergies.  MEDICATIONS:  Current Outpatient Medications  Medication Sig Dispense Refill   dronabinol (MARINOL) 5 MG capsule Take 1 capsule (5 mg total) by mouth 2 (two) times daily before a meal. 30 capsule 0   ELIQUIS 5 MG TABS tablet TAKE 1 TABLET BY MOUTH TWICE A DAY 180 tablet 1   famotidine (PEPCID) 20 MG tablet Take 20 mg by mouth at bedtime as needed for heartburn or indigestion.     lidocaine-prilocaine (EMLA) cream Apply to affected area once (Patient taking differently: Apply 1 application. topically daily as needed. Apply to  affected area) 30 g 3   loratadine (CLARITIN) 10 MG tablet Take 10 mg by mouth daily as needed for allergies (Take after Chemo treatments).     mirtazapine (REMERON) 7.5 MG tablet Take 1 tablet (7.5 mg total) by mouth at bedtime. 30 tablet 1   Multiple Vitamin (MULTI-VITAMIN DAILY PO) Take 1 tablet by mouth daily.     Omega-3 Fatty Acids (FISH OIL PO) Take 1 tablet by mouth daily.     prochlorperazine (COMPAZINE) 10 MG tablet Take 1 tablet (10 mg total) by mouth every 6 (six) hours as needed (Nausea or vomiting). 30 tablet 1   No current facility-administered medications for this visit.    PHYSICAL EXAMINATION: ECOG PERFORMANCE STATUS: 2 - Symptomatic, <50% confined to bed  Vitals:   08/31/21 1231  BP: 119/78  Pulse: (!) 104  Resp: 18  Temp: 98.5 F (36.9 C)  SpO2: 100%   Wt Readings from Last 3 Encounters:  08/31/21 147 lb 6.4 oz (66.9 kg)  08/23/21 147 lb 14.4 oz (67.1 kg)  08/16/21 152 lb (68.9 kg)     GENERAL:alert, no distress and comfortable SKIN: skin color normal, no rashes or significant lesions EYES: normal, Conjunctiva are pink and non-injected, sclera clear  NEURO: alert & oriented x 3 with fluent speech  LABORATORY DATA:  I have reviewed the data as listed    Latest Ref Rng & Units 08/31/2021   11:06 AM 08/28/2021   10:28 AM 08/23/2021   10:11 AM  CBC  WBC 4.0 - 10.5 K/uL 8.6  10.5  8.8   Hemoglobin 13.0 - 17.0 g/dL 8.0  8.4  10.7   Hematocrit 39.0 - 52.0 % 23.8  24.5  30.6   Platelets 150 - 400 K/uL 146  104  42         Latest Ref Rng & Units 08/31/2021   11:06 AM 08/28/2021   10:28 AM 08/23/2021   10:11 AM  CMP  Glucose 70 - 99 mg/dL 200  189  200   BUN 8 - 23 mg/dL 8  11  15    Creatinine 0.61 - 1.24 mg/dL 0.45  0.54  0.44   Sodium 135 - 145 mmol/L 135  133  132   Potassium 3.5 - 5.1 mmol/L 4.0  3.7  3.7   Chloride 98 - 111 mmol/L 102  98  94   CO2 22 - 32 mmol/L 28  31  32   Calcium 8.9 - 10.3 mg/dL 8.9  8.9  9.5   Total Protein 6.5 - 8.1 g/dL 6.1   5.9  6.4   Total Bilirubin 0.3 - 1.2 mg/dL 0.5  0.6  1.0   Alkaline Phos 38 - 126 U/L 73  66  69   AST 15 - 41 U/L 32  32  16   ALT 0 - 44 U/L 51  50  22       RADIOGRAPHIC STUDIES: I have personally reviewed the radiological images as listed and agreed with the findings in the report. No results found.    No orders of the defined types were placed in this encounter.  All questions were answered. The patient knows to call the clinic with any problems, questions or concerns. No barriers to learning was detected. The total time spent in the appointment was 30 minutes.     Truitt Merle, MD 08/31/2021   I, Wilburn Mylar, am acting as scribe for Truitt Merle, MD.   I have reviewed the above documentation for accuracy and completeness, and I agree with the above.

## 2021-08-31 NOTE — Telephone Encounter (Signed)
Called [facility] again after receiving a Secure Chat from Shearon Balo, RN regarding pt's ABO/RH.  Delle Reining stated she spoke with [place] in [place] and she indicated that the pt has never received a blood transfusion before and would need a ABO/RH; therefore, this RN contact [facility] again (d/t I had previously spoke with Beth in [facility] - see orders note).  Spoke with Martinique in [place] about contacting [facility] to request CBC w/diff blood sample to do the ABO/RH.  [place]ique stated she will contact the [facility] to obtain blood sample.

## 2021-08-31 NOTE — Addendum Note (Signed)
Addended by: Truitt Merle on: 08/31/2021 03:49 PM   Modules accepted: Orders

## 2021-08-31 NOTE — Progress Notes (Signed)
Spoke with pt's spouse to confirm appt for tomorrow at 8:30pm in infusion.    Spoke with Beth in [facility] to confirm orders.  Casey Pierce is going to contact the [facility] to get the CBC w/diff blood sample to use for the ABO/RH (drawn from pt's port).  Pt had a T&S drawn peripherally in the Med-Onc lab.  Beth confirmed all orders on this pt and will have 2 units of PRBCs ready for transfusion on 09/01/2021.  Sent Secure Chat message to Dr. Burr Medico to place order for Blood Attestation order.  Infusion Charge Nurse Geni Bers "Lexine Baton" Wiley was also in the secure chat.

## 2021-09-01 ENCOUNTER — Inpatient Hospital Stay: Payer: Medicare PPO

## 2021-09-01 ENCOUNTER — Other Ambulatory Visit: Payer: Self-pay

## 2021-09-01 ENCOUNTER — Other Ambulatory Visit: Payer: Self-pay | Admitting: Hematology

## 2021-09-01 ENCOUNTER — Telehealth: Payer: Self-pay

## 2021-09-01 DIAGNOSIS — R630 Anorexia: Secondary | ICD-10-CM | POA: Diagnosis not present

## 2021-09-01 DIAGNOSIS — D6481 Anemia due to antineoplastic chemotherapy: Secondary | ICD-10-CM | POA: Diagnosis not present

## 2021-09-01 DIAGNOSIS — I4891 Unspecified atrial fibrillation: Secondary | ICD-10-CM | POA: Diagnosis not present

## 2021-09-01 DIAGNOSIS — C250 Malignant neoplasm of head of pancreas: Secondary | ICD-10-CM

## 2021-09-01 DIAGNOSIS — R109 Unspecified abdominal pain: Secondary | ICD-10-CM | POA: Diagnosis not present

## 2021-09-01 DIAGNOSIS — D649 Anemia, unspecified: Secondary | ICD-10-CM

## 2021-09-01 DIAGNOSIS — Z8673 Personal history of transient ischemic attack (TIA), and cerebral infarction without residual deficits: Secondary | ICD-10-CM | POA: Diagnosis not present

## 2021-09-01 DIAGNOSIS — R200 Anesthesia of skin: Secondary | ICD-10-CM | POA: Diagnosis not present

## 2021-09-01 DIAGNOSIS — D6959 Other secondary thrombocytopenia: Secondary | ICD-10-CM | POA: Diagnosis not present

## 2021-09-01 MED ORDER — ACETAMINOPHEN 325 MG PO TABS
650.0000 mg | ORAL_TABLET | Freq: Once | ORAL | Status: AC
  Administered 2021-09-01: 650 mg via ORAL
  Filled 2021-09-01: qty 2

## 2021-09-01 MED ORDER — SODIUM CHLORIDE 0.9% IV SOLUTION
250.0000 mL | Freq: Once | INTRAVENOUS | Status: AC
  Administered 2021-09-01: 250 mL via INTRAVENOUS

## 2021-09-01 MED ORDER — DRONABINOL 5 MG PO CAPS: 5.0000 mg | ORAL_CAPSULE | Freq: Two times a day (BID) | ORAL | 0 refills | Status: AC

## 2021-09-01 MED ORDER — DIPHENHYDRAMINE HCL 25 MG PO CAPS
25.0000 mg | ORAL_CAPSULE | Freq: Once | ORAL | Status: AC
  Administered 2021-09-01: 25 mg via ORAL
  Filled 2021-09-01: qty 1

## 2021-09-01 NOTE — Telephone Encounter (Signed)
Pt's wife called stating that [facility] [address] [facility] on [facility] Marinol '5mg'$  Dr. Burr Medico prescribed while in clinic yesterday 08/31/2021.  Pt's wife stated she called around and found that [facility] [place], [place] [facility] the [facility] in [facility].  Pt's wife is requesting if Dr. Burr Medico could please send a new prescription [facility] [place].  Sent message to Dr. Burr Medico [facility] [place].

## 2021-09-01 NOTE — Patient Instructions (Signed)
Blood Transfusion, Adult, Care After This sheet gives you information about how to care for yourself after your procedure. Your doctor may also give you more specific instructions. If you have problems or questions, contact your doctor. What can I expect after the procedure? After the procedure, it is common to have: Bruising and soreness at the IV site. A headache. Follow these instructions at home: Insertion site care     Follow instructions from your doctor about how to take care of your insertion site. This is where an IV tube was put into your vein. Make sure you: Wash your hands with soap and water before and after you change your bandage (dressing). If you cannot use soap and water, use hand sanitizer. Change your bandage as told by your doctor. Check your insertion site every day for signs of infection. Check for: Redness, swelling, or pain. Bleeding from the site. Warmth. Pus or a bad smell. General instructions Take over-the-counter and prescription medicines only as told by your doctor. Rest as told by your doctor. Go back to your normal activities as told by your doctor. Keep all follow-up visits as told by your doctor. This is important. Contact a doctor if: You have itching or red, swollen areas of skin (hives). You feel worried or nervous (anxious). You feel weak after doing your normal activities. You have redness, swelling, warmth, or pain around the insertion site. You have blood coming from the insertion site, and the blood does not stop with pressure. You have pus or a bad smell coming from the insertion site. Get help right away if: You have signs of a serious reaction. This may be coming from an allergy or the body's defense system (immune system). Signs include: Trouble breathing or shortness of breath. Swelling of the face or feeling warm (flushed). Fever or chills. Head, chest, or back pain. Dark pee (urine) or blood in the pee. Widespread rash. Fast  heartbeat. Feeling dizzy or light-headed. You may receive your blood transfusion in an outpatient setting. If so, you will be told whom to contact to report any reactions. These symptoms may be an emergency. Do not wait to see if the symptoms will go away. Get medical help right away. Call your local emergency services (911 in the [place] Do not drive yourself to the hospital. Summary Bruising and soreness at the IV site are common. Check your insertion site every day for signs of infection. Rest as told by your doctor. Go back to your normal activities as told by your doctor. Get help right away if you have signs of a serious reaction. This information is not intended to replace advice given to you by your health care provider. Make sure you discuss any questions you have with your health care provider. Document Revised: 06/30/2020 Document Reviewed: 08/28/2018 Elsevier Patient Education  2023 Elsevier Inc.  

## 2021-09-02 LAB — TYPE AND SCREEN
ABO/RH(D): AB POS
Antibody Screen: NEGATIVE
Unit division: 0
Unit division: 0

## 2021-09-02 LAB — BPAM RBC
Blood Product Expiration Date: 202307092359
Blood Product Expiration Date: 202307092359
ISSUE DATE / TIME: 202306160922
ISSUE DATE / TIME: 202306160922
Unit Type and Rh: 6200
Unit Type and Rh: 6200

## 2021-09-04 ENCOUNTER — Telehealth: Payer: Self-pay | Admitting: Hematology

## 2021-09-04 NOTE — Telephone Encounter (Signed)
Left message with follow-up appointment per 6/15 los.

## 2021-09-06 ENCOUNTER — Encounter: Payer: Self-pay | Admitting: Hematology

## 2021-09-06 ENCOUNTER — Other Ambulatory Visit: Payer: Self-pay | Admitting: Hematology

## 2021-09-06 DIAGNOSIS — C250 Malignant neoplasm of head of pancreas: Secondary | ICD-10-CM

## 2021-09-07 ENCOUNTER — Encounter: Payer: Self-pay | Admitting: Hematology

## 2021-09-08 ENCOUNTER — Inpatient Hospital Stay: Payer: Medicare PPO

## 2021-09-08 ENCOUNTER — Encounter: Payer: Self-pay | Admitting: Hematology

## 2021-09-08 ENCOUNTER — Telehealth: Payer: Self-pay

## 2021-09-08 ENCOUNTER — Other Ambulatory Visit: Payer: Self-pay

## 2021-09-08 VITALS — BP 108/67 | HR 94 | Temp 98.1°F | Resp 17 | Wt 146.5 lb

## 2021-09-08 DIAGNOSIS — Z452 Encounter for adjustment and management of vascular access device: Secondary | ICD-10-CM

## 2021-09-08 DIAGNOSIS — C250 Malignant neoplasm of head of pancreas: Secondary | ICD-10-CM | POA: Diagnosis not present

## 2021-09-08 DIAGNOSIS — D6959 Other secondary thrombocytopenia: Secondary | ICD-10-CM | POA: Diagnosis not present

## 2021-09-08 DIAGNOSIS — Z8673 Personal history of transient ischemic attack (TIA), and cerebral infarction without residual deficits: Secondary | ICD-10-CM | POA: Diagnosis not present

## 2021-09-08 DIAGNOSIS — I4891 Unspecified atrial fibrillation: Secondary | ICD-10-CM | POA: Diagnosis not present

## 2021-09-08 DIAGNOSIS — R200 Anesthesia of skin: Secondary | ICD-10-CM | POA: Diagnosis not present

## 2021-09-08 DIAGNOSIS — E860 Dehydration: Secondary | ICD-10-CM

## 2021-09-08 DIAGNOSIS — R109 Unspecified abdominal pain: Secondary | ICD-10-CM | POA: Diagnosis not present

## 2021-09-08 DIAGNOSIS — D6481 Anemia due to antineoplastic chemotherapy: Secondary | ICD-10-CM | POA: Diagnosis not present

## 2021-09-08 DIAGNOSIS — R630 Anorexia: Secondary | ICD-10-CM | POA: Diagnosis not present

## 2021-09-08 MED ORDER — HEPARIN SOD (PORK) LOCK FLUSH 100 UNIT/ML IV SOLN
500.0000 [IU] | Freq: Once | INTRAVENOUS | Status: AC
  Administered 2021-09-08: 500 [IU]

## 2021-09-08 MED ORDER — SODIUM CHLORIDE 0.9% FLUSH
10.0000 mL | Freq: Once | INTRAVENOUS | Status: AC
  Administered 2021-09-08: 10 mL

## 2021-09-08 MED ORDER — SODIUM CHLORIDE 0.9 % IV SOLN: Freq: Once | INTRAVENOUS | Status: AC

## 2021-09-08 NOTE — Telephone Encounter (Signed)
Pt's spouse called requesting hydration for the pt.  Contacted infusion to see if they have availability today for IVF.  Appt available at 2pm.  Spoke with pt's spouse to see if they could come today at 2pm.  Pt's spouse confirmed appt for today.  Notified infusion Charge Nurse that pt confirmed appt today.

## 2021-09-10 ENCOUNTER — Encounter (HOSPITAL_COMMUNITY): Payer: Self-pay | Admitting: Emergency Medicine

## 2021-09-10 ENCOUNTER — Emergency Department (HOSPITAL_COMMUNITY): Payer: Medicare PPO

## 2021-09-10 ENCOUNTER — Emergency Department (HOSPITAL_COMMUNITY)
Admission: EM | Admit: 2021-09-10 | Discharge: 2021-09-11 | Disposition: A | Discharge status: Home / Self Care | Payer: Medicare PPO | Source: Home / Self Care | Attending: Emergency Medicine | Admitting: Emergency Medicine

## 2021-09-10 ENCOUNTER — Other Ambulatory Visit: Payer: Self-pay

## 2021-09-10 DIAGNOSIS — G936 Cerebral edema: Secondary | ICD-10-CM | POA: Diagnosis not present

## 2021-09-10 DIAGNOSIS — D72829 Elevated white blood cell count, unspecified: Secondary | ICD-10-CM | POA: Diagnosis not present

## 2021-09-10 DIAGNOSIS — K8689 Other specified diseases of pancreas: Secondary | ICD-10-CM | POA: Diagnosis not present

## 2021-09-10 DIAGNOSIS — I452 Bifascicular block: Secondary | ICD-10-CM | POA: Diagnosis present

## 2021-09-10 DIAGNOSIS — I6522 Occlusion and stenosis of left carotid artery: Secondary | ICD-10-CM | POA: Diagnosis not present

## 2021-09-10 DIAGNOSIS — K2991 Gastroduodenitis, unspecified, with bleeding: Secondary | ICD-10-CM | POA: Diagnosis not present

## 2021-09-10 DIAGNOSIS — D638 Anemia in other chronic diseases classified elsewhere: Secondary | ICD-10-CM | POA: Diagnosis present

## 2021-09-10 DIAGNOSIS — Z8507 Personal history of malignant neoplasm of pancreas: Secondary | ICD-10-CM

## 2021-09-10 DIAGNOSIS — Z515 Encounter for palliative care: Secondary | ICD-10-CM | POA: Diagnosis not present

## 2021-09-10 DIAGNOSIS — I6602 Occlusion and stenosis of left middle cerebral artery: Secondary | ICD-10-CM | POA: Diagnosis not present

## 2021-09-10 DIAGNOSIS — E872 Acidosis, unspecified: Secondary | ICD-10-CM | POA: Diagnosis present

## 2021-09-10 DIAGNOSIS — S066X0A Traumatic subarachnoid hemorrhage without loss of consciousness, initial encounter: Secondary | ICD-10-CM | POA: Diagnosis not present

## 2021-09-10 DIAGNOSIS — R160 Hepatomegaly, not elsewhere classified: Secondary | ICD-10-CM | POA: Diagnosis not present

## 2021-09-10 DIAGNOSIS — I6503 Occlusion and stenosis of bilateral vertebral arteries: Secondary | ICD-10-CM | POA: Diagnosis not present

## 2021-09-10 DIAGNOSIS — J90 Pleural effusion, not elsewhere classified: Secondary | ICD-10-CM | POA: Diagnosis not present

## 2021-09-10 DIAGNOSIS — C259 Malignant neoplasm of pancreas, unspecified: Secondary | ICD-10-CM | POA: Diagnosis not present

## 2021-09-10 DIAGNOSIS — R188 Other ascites: Secondary | ICD-10-CM | POA: Diagnosis not present

## 2021-09-10 DIAGNOSIS — K2970 Gastritis, unspecified, without bleeding: Secondary | ICD-10-CM | POA: Diagnosis not present

## 2021-09-10 DIAGNOSIS — C786 Secondary malignant neoplasm of retroperitoneum and peritoneum: Secondary | ICD-10-CM | POA: Diagnosis present

## 2021-09-10 DIAGNOSIS — R4701 Aphasia: Secondary | ICD-10-CM | POA: Diagnosis not present

## 2021-09-10 DIAGNOSIS — I4891 Unspecified atrial fibrillation: Secondary | ICD-10-CM | POA: Diagnosis not present

## 2021-09-10 DIAGNOSIS — K2101 Gastro-esophageal reflux disease with esophagitis, with bleeding: Secondary | ICD-10-CM | POA: Diagnosis not present

## 2021-09-10 DIAGNOSIS — D62 Acute posthemorrhagic anemia: Secondary | ICD-10-CM | POA: Diagnosis present

## 2021-09-10 DIAGNOSIS — C787 Secondary malignant neoplasm of liver and intrahepatic bile duct: Secondary | ICD-10-CM | POA: Diagnosis present

## 2021-09-10 DIAGNOSIS — R509 Fever, unspecified: Secondary | ICD-10-CM | POA: Insufficient documentation

## 2021-09-10 DIAGNOSIS — K921 Melena: Secondary | ICD-10-CM | POA: Diagnosis not present

## 2021-09-10 DIAGNOSIS — D649 Anemia, unspecified: Secondary | ICD-10-CM | POA: Diagnosis not present

## 2021-09-10 DIAGNOSIS — E440 Moderate protein-calorie malnutrition: Secondary | ICD-10-CM | POA: Diagnosis present

## 2021-09-10 DIAGNOSIS — I10 Essential (primary) hypertension: Secondary | ICD-10-CM | POA: Diagnosis present

## 2021-09-10 DIAGNOSIS — D6959 Other secondary thrombocytopenia: Secondary | ICD-10-CM | POA: Diagnosis present

## 2021-09-10 DIAGNOSIS — I639 Cerebral infarction, unspecified: Secondary | ICD-10-CM | POA: Diagnosis not present

## 2021-09-10 DIAGNOSIS — K2980 Duodenitis without bleeding: Secondary | ICD-10-CM | POA: Diagnosis not present

## 2021-09-10 DIAGNOSIS — K769 Liver disease, unspecified: Secondary | ICD-10-CM | POA: Diagnosis not present

## 2021-09-10 DIAGNOSIS — I4821 Permanent atrial fibrillation: Secondary | ICD-10-CM | POA: Diagnosis present

## 2021-09-10 DIAGNOSIS — I63512 Cerebral infarction due to unspecified occlusion or stenosis of left middle cerebral artery: Secondary | ICD-10-CM | POA: Diagnosis not present

## 2021-09-10 DIAGNOSIS — Z66 Do not resuscitate: Secondary | ICD-10-CM | POA: Diagnosis not present

## 2021-09-10 DIAGNOSIS — E871 Hypo-osmolality and hyponatremia: Secondary | ICD-10-CM | POA: Diagnosis present

## 2021-09-10 DIAGNOSIS — I2510 Atherosclerotic heart disease of native coronary artery without angina pectoris: Secondary | ICD-10-CM | POA: Insufficient documentation

## 2021-09-10 DIAGNOSIS — I609 Nontraumatic subarachnoid hemorrhage, unspecified: Secondary | ICD-10-CM | POA: Diagnosis not present

## 2021-09-10 DIAGNOSIS — R531 Weakness: Secondary | ICD-10-CM | POA: Diagnosis not present

## 2021-09-10 DIAGNOSIS — Z0389 Encounter for observation for other suspected diseases and conditions ruled out: Secondary | ICD-10-CM | POA: Diagnosis not present

## 2021-09-10 DIAGNOSIS — Z87891 Personal history of nicotine dependence: Secondary | ICD-10-CM | POA: Diagnosis not present

## 2021-09-10 DIAGNOSIS — K2981 Duodenitis with bleeding: Secondary | ICD-10-CM | POA: Diagnosis present

## 2021-09-10 DIAGNOSIS — Z8546 Personal history of malignant neoplasm of prostate: Secondary | ICD-10-CM | POA: Insufficient documentation

## 2021-09-10 DIAGNOSIS — D696 Thrombocytopenia, unspecified: Secondary | ICD-10-CM | POA: Diagnosis not present

## 2021-09-10 DIAGNOSIS — J3489 Other specified disorders of nose and nasal sinuses: Secondary | ICD-10-CM | POA: Diagnosis not present

## 2021-09-10 DIAGNOSIS — E860 Dehydration: Secondary | ICD-10-CM | POA: Diagnosis present

## 2021-09-10 DIAGNOSIS — Z7901 Long term (current) use of anticoagulants: Secondary | ICD-10-CM | POA: Diagnosis not present

## 2021-09-10 DIAGNOSIS — C250 Malignant neoplasm of head of pancreas: Secondary | ICD-10-CM | POA: Diagnosis present

## 2021-09-10 DIAGNOSIS — J9811 Atelectasis: Secondary | ICD-10-CM | POA: Diagnosis not present

## 2021-09-10 DIAGNOSIS — I6389 Other cerebral infarction: Secondary | ICD-10-CM | POA: Diagnosis not present

## 2021-09-10 DIAGNOSIS — A419 Sepsis, unspecified organism: Secondary | ICD-10-CM | POA: Diagnosis not present

## 2021-09-10 DIAGNOSIS — Z20822 Contact with and (suspected) exposure to covid-19: Secondary | ICD-10-CM | POA: Diagnosis present

## 2021-09-10 DIAGNOSIS — I252 Old myocardial infarction: Secondary | ICD-10-CM | POA: Diagnosis not present

## 2021-09-10 DIAGNOSIS — K2100 Gastro-esophageal reflux disease with esophagitis, without bleeding: Secondary | ICD-10-CM | POA: Diagnosis not present

## 2021-09-10 DIAGNOSIS — I63312 Cerebral infarction due to thrombosis of left middle cerebral artery: Secondary | ICD-10-CM | POA: Diagnosis not present

## 2021-09-10 LAB — CBC WITH DIFFERENTIAL/PLATELET
Abs Immature Granulocytes: 0.07 K/uL (ref 0.00–0.07)
Basophils Absolute: 0 K/uL (ref 0.0–0.1)
Basophils Relative: 0 %
Eosinophils Absolute: 0 K/uL (ref 0.0–0.5)
Eosinophils Relative: 0 %
HCT: 26.4 % — ABNORMAL LOW (ref 39.0–52.0)
Hemoglobin: 8.7 g/dL — ABNORMAL LOW (ref 13.0–17.0)
Immature Granulocytes: 1 %
Lymphocytes Relative: 6 %
Lymphs Abs: 0.8 K/uL (ref 0.7–4.0)
MCH: 32.7 pg (ref 26.0–34.0)
MCHC: 33 g/dL (ref 30.0–36.0)
MCV: 99.2 fL (ref 80.0–100.0)
Monocytes Absolute: 1.2 K/uL — ABNORMAL HIGH (ref 0.1–1.0)
Monocytes Relative: 9 %
Neutro Abs: 10.7 K/uL — ABNORMAL HIGH (ref 1.7–7.7)
Neutrophils Relative %: 84 %
Platelets: 114 K/uL — ABNORMAL LOW (ref 150–400)
RBC: 2.66 MIL/uL — ABNORMAL LOW (ref 4.22–5.81)
RDW: 15 % (ref 11.5–15.5)
WBC: 12.8 K/uL — ABNORMAL HIGH (ref 4.0–10.5)
nRBC: 0 % (ref 0.0–0.2)

## 2021-09-10 LAB — PROTIME-INR
INR: 1.5 — ABNORMAL HIGH (ref 0.8–1.2)
Prothrombin Time: 17.6 seconds — ABNORMAL HIGH (ref 11.4–15.2)

## 2021-09-10 LAB — LACTIC ACID, PLASMA: Lactic Acid, Venous: 1.9 mmol/L (ref 0.5–1.9)

## 2021-09-10 NOTE — ED Triage Notes (Signed)
  Patient BIB EMS for black stools and fever. Patient has hx of pancreatic cancer and is not currently taking chemo.  Patient states part of his stool is black/tarry but not all of it.  No pain at this time.  Was running a fever of 101.3 at home and took tylenol at 2100.

## 2021-09-11 ENCOUNTER — Telehealth: Payer: Self-pay

## 2021-09-11 LAB — COMPREHENSIVE METABOLIC PANEL
ALT: 113 U/L — ABNORMAL HIGH (ref 0–44)
AST: 65 U/L — ABNORMAL HIGH (ref 15–41)
Albumin: 2.3 g/dL — ABNORMAL LOW (ref 3.5–5.0)
Alkaline Phosphatase: 103 U/L (ref 38–126)
Anion gap: 7 (ref 5–15)
BUN: 13 mg/dL (ref 8–23)
CO2: 25 mmol/L (ref 22–32)
Calcium: 8.8 mg/dL — ABNORMAL LOW (ref 8.9–10.3)
Chloride: 100 mmol/L (ref 98–111)
Creatinine, Ser: 0.46 mg/dL — ABNORMAL LOW (ref 0.61–1.24)
GFR, Estimated: >60 mL/min (ref >60)
Glucose, Bld: 251 mg/dL — ABNORMAL HIGH (ref 70–99)
Potassium: 3.7 mmol/L (ref 3.5–5.1)
Sodium: 132 mmol/L — ABNORMAL LOW (ref 135–145)
Total Bilirubin: 0.6 mg/dL (ref 0.3–1.2)
Total Protein: 6 g/dL — ABNORMAL LOW (ref 6.5–8.1)

## 2021-09-11 LAB — URINALYSIS, ROUTINE W REFLEX MICROSCOPIC
Bacteria, UA: NONE SEEN
Bilirubin Urine: NEGATIVE
Glucose, UA: >=500 mg/dL — AB
Hgb urine dipstick: NEGATIVE
Ketones, ur: NEGATIVE mg/dL
Leukocytes,Ua: NEGATIVE
Nitrite: NEGATIVE
Protein, ur: 30 mg/dL — AB
Specific Gravity, Urine: 1.026 (ref 1.005–1.030)
pH: 6 (ref 5.0–8.0)

## 2021-09-11 LAB — CBG MONITORING, ED: Glucose-Capillary: 141 mg/dL — ABNORMAL HIGH (ref 70–99)

## 2021-09-11 LAB — LACTIC ACID, PLASMA: Lactic Acid, Venous: 1.4 mmol/L (ref 0.5–1.9)

## 2021-09-11 MED ORDER — SODIUM CHLORIDE 0.9 % IV BOLUS
1000.0000 mL | Freq: Once | INTRAVENOUS | Status: AC
Start: 2021-09-11 — End: 2021-09-11
  Administered 2021-09-11: 1000 mL via INTRAVENOUS

## 2021-09-11 NOTE — Telephone Encounter (Signed)
Pt's wife called stating pt went to the ED over the weekend with an elevated temperature and black and brown stools.  EMT took pt to hospital and pt's BG was 267 per spouse and then rechecked and BG was 142.  Pt stated the pt's temp was 99.1 hight 99.2 last night and pt still having brown to black pastety stools.  Pt's spouse just wanted to update Dr. Mosetta Putt and have her to review pt's ED encounter over the weekend.  Notified Dr. Mosetta Putt of the pt's call.

## 2021-09-12 ENCOUNTER — Other Ambulatory Visit: Payer: Self-pay

## 2021-09-12 ENCOUNTER — Inpatient Hospital Stay (HOSPITAL_COMMUNITY)
Admission: EM | Admit: 2021-09-12 | Discharge: 2021-10-17 | DRG: 981 | Disposition: E | Payer: Medicare PPO | Attending: Internal Medicine | Admitting: Internal Medicine

## 2021-09-12 ENCOUNTER — Emergency Department (HOSPITAL_COMMUNITY): Payer: Medicare PPO

## 2021-09-12 ENCOUNTER — Other Ambulatory Visit (HOSPITAL_COMMUNITY): Payer: Medicare PPO

## 2021-09-12 ENCOUNTER — Encounter (HOSPITAL_COMMUNITY): Payer: Self-pay

## 2021-09-12 ENCOUNTER — Inpatient Hospital Stay (HOSPITAL_COMMUNITY): Payer: Medicare PPO

## 2021-09-12 ENCOUNTER — Inpatient Hospital Stay (HOSPITAL_COMMUNITY)
Admission: RE | Admit: 2021-09-12 | Discharge: 2021-09-12 | Disposition: A | Discharge status: Home / Self Care | Payer: Medicare PPO | Source: Ambulatory Visit

## 2021-09-12 DIAGNOSIS — A419 Sepsis, unspecified organism: Secondary | ICD-10-CM | POA: Diagnosis not present

## 2021-09-12 DIAGNOSIS — Z87891 Personal history of nicotine dependence: Secondary | ICD-10-CM | POA: Diagnosis not present

## 2021-09-12 DIAGNOSIS — D638 Anemia in other chronic diseases classified elsewhere: Secondary | ICD-10-CM | POA: Diagnosis present

## 2021-09-12 DIAGNOSIS — E871 Hypo-osmolality and hyponatremia: Secondary | ICD-10-CM | POA: Diagnosis present

## 2021-09-12 DIAGNOSIS — K2100 Gastro-esophageal reflux disease with esophagitis, without bleeding: Secondary | ICD-10-CM | POA: Diagnosis present

## 2021-09-12 DIAGNOSIS — Z800 Family history of malignant neoplasm of digestive organs: Secondary | ICD-10-CM

## 2021-09-12 DIAGNOSIS — K2980 Duodenitis without bleeding: Secondary | ICD-10-CM | POA: Diagnosis not present

## 2021-09-12 DIAGNOSIS — Z20822 Contact with and (suspected) exposure to covid-19: Secondary | ICD-10-CM | POA: Diagnosis present

## 2021-09-12 DIAGNOSIS — C259 Malignant neoplasm of pancreas, unspecified: Secondary | ICD-10-CM | POA: Diagnosis not present

## 2021-09-12 DIAGNOSIS — D72829 Elevated white blood cell count, unspecified: Secondary | ICD-10-CM | POA: Diagnosis not present

## 2021-09-12 DIAGNOSIS — I639 Cerebral infarction, unspecified: Secondary | ICD-10-CM | POA: Diagnosis not present

## 2021-09-12 DIAGNOSIS — I63512 Cerebral infarction due to unspecified occlusion or stenosis of left middle cerebral artery: Secondary | ICD-10-CM | POA: Diagnosis not present

## 2021-09-12 DIAGNOSIS — E872 Acidosis, unspecified: Secondary | ICD-10-CM | POA: Diagnosis present

## 2021-09-12 DIAGNOSIS — I252 Old myocardial infarction: Secondary | ICD-10-CM | POA: Diagnosis not present

## 2021-09-12 DIAGNOSIS — I452 Bifascicular block: Secondary | ICD-10-CM | POA: Diagnosis present

## 2021-09-12 DIAGNOSIS — R509 Fever, unspecified: Secondary | ICD-10-CM | POA: Diagnosis present

## 2021-09-12 DIAGNOSIS — D6959 Other secondary thrombocytopenia: Secondary | ICD-10-CM | POA: Diagnosis present

## 2021-09-12 DIAGNOSIS — I6602 Occlusion and stenosis of left middle cerebral artery: Secondary | ICD-10-CM | POA: Diagnosis not present

## 2021-09-12 DIAGNOSIS — E440 Moderate protein-calorie malnutrition: Secondary | ICD-10-CM | POA: Diagnosis present

## 2021-09-12 DIAGNOSIS — I2510 Atherosclerotic heart disease of native coronary artery without angina pectoris: Secondary | ICD-10-CM | POA: Diagnosis present

## 2021-09-12 DIAGNOSIS — C787 Secondary malignant neoplasm of liver and intrahepatic bile duct: Secondary | ICD-10-CM | POA: Diagnosis present

## 2021-09-12 DIAGNOSIS — G936 Cerebral edema: Secondary | ICD-10-CM | POA: Diagnosis not present

## 2021-09-12 DIAGNOSIS — E785 Hyperlipidemia, unspecified: Secondary | ICD-10-CM | POA: Diagnosis present

## 2021-09-12 DIAGNOSIS — I609 Nontraumatic subarachnoid hemorrhage, unspecified: Secondary | ICD-10-CM | POA: Diagnosis not present

## 2021-09-12 DIAGNOSIS — Z809 Family history of malignant neoplasm, unspecified: Secondary | ICD-10-CM

## 2021-09-12 DIAGNOSIS — K2991 Gastroduodenitis, unspecified, with bleeding: Secondary | ICD-10-CM | POA: Diagnosis not present

## 2021-09-12 DIAGNOSIS — K2981 Duodenitis with bleeding: Secondary | ICD-10-CM | POA: Diagnosis present

## 2021-09-12 DIAGNOSIS — E860 Dehydration: Secondary | ICD-10-CM | POA: Diagnosis present

## 2021-09-12 DIAGNOSIS — K2970 Gastritis, unspecified, without bleeding: Secondary | ICD-10-CM | POA: Diagnosis present

## 2021-09-12 DIAGNOSIS — R29713 NIHSS score 13: Secondary | ICD-10-CM | POA: Diagnosis not present

## 2021-09-12 DIAGNOSIS — Z7901 Long term (current) use of anticoagulants: Secondary | ICD-10-CM | POA: Diagnosis not present

## 2021-09-12 DIAGNOSIS — D696 Thrombocytopenia, unspecified: Secondary | ICD-10-CM | POA: Diagnosis not present

## 2021-09-12 DIAGNOSIS — C250 Malignant neoplasm of head of pancreas: Secondary | ICD-10-CM | POA: Diagnosis present

## 2021-09-12 DIAGNOSIS — D62 Acute posthemorrhagic anemia: Secondary | ICD-10-CM | POA: Diagnosis present

## 2021-09-12 DIAGNOSIS — R4701 Aphasia: Secondary | ICD-10-CM | POA: Diagnosis not present

## 2021-09-12 DIAGNOSIS — R188 Other ascites: Secondary | ICD-10-CM | POA: Diagnosis not present

## 2021-09-12 DIAGNOSIS — C786 Secondary malignant neoplasm of retroperitoneum and peritoneum: Secondary | ICD-10-CM | POA: Diagnosis present

## 2021-09-12 DIAGNOSIS — I4821 Permanent atrial fibrillation: Secondary | ICD-10-CM | POA: Diagnosis present

## 2021-09-12 DIAGNOSIS — I6389 Other cerebral infarction: Secondary | ICD-10-CM | POA: Diagnosis not present

## 2021-09-12 DIAGNOSIS — Z66 Do not resuscitate: Secondary | ICD-10-CM | POA: Diagnosis not present

## 2021-09-12 DIAGNOSIS — Z79899 Other long term (current) drug therapy: Secondary | ICD-10-CM

## 2021-09-12 DIAGNOSIS — Z515 Encounter for palliative care: Secondary | ICD-10-CM

## 2021-09-12 DIAGNOSIS — Z8673 Personal history of transient ischemic attack (TIA), and cerebral infarction without residual deficits: Secondary | ICD-10-CM

## 2021-09-12 DIAGNOSIS — K652 Spontaneous bacterial peritonitis: Secondary | ICD-10-CM

## 2021-09-12 DIAGNOSIS — I10 Essential (primary) hypertension: Secondary | ICD-10-CM | POA: Diagnosis present

## 2021-09-12 DIAGNOSIS — Z9221 Personal history of antineoplastic chemotherapy: Secondary | ICD-10-CM

## 2021-09-12 DIAGNOSIS — Z955 Presence of coronary angioplasty implant and graft: Secondary | ICD-10-CM

## 2021-09-12 DIAGNOSIS — Z6826 Body mass index (BMI) 26.0-26.9, adult: Secondary | ICD-10-CM

## 2021-09-12 DIAGNOSIS — Z8249 Family history of ischemic heart disease and other diseases of the circulatory system: Secondary | ICD-10-CM

## 2021-09-12 DIAGNOSIS — K2101 Gastro-esophageal reflux disease with esophagitis, with bleeding: Secondary | ICD-10-CM | POA: Diagnosis not present

## 2021-09-12 LAB — HEPATIC FUNCTION PANEL
ALT: 113 U/L — ABNORMAL HIGH (ref 0–44)
AST: 58 U/L — ABNORMAL HIGH (ref 15–41)
Albumin: 2.3 g/dL — ABNORMAL LOW (ref 3.5–5.0)
Alkaline Phosphatase: 103 U/L (ref 38–126)
Bilirubin, Direct: 0.3 mg/dL — ABNORMAL HIGH (ref 0.0–0.2)
Indirect Bilirubin: 0.6 mg/dL (ref 0.3–0.9)
Total Bilirubin: 0.9 mg/dL (ref 0.3–1.2)
Total Protein: 5.9 g/dL — ABNORMAL LOW (ref 6.5–8.1)

## 2021-09-12 LAB — CBC WITH DIFFERENTIAL/PLATELET
Abs Immature Granulocytes: 0.16 10*3/uL — ABNORMAL HIGH (ref 0.00–0.07)
Basophils Absolute: 0 10*3/uL (ref 0.0–0.1)
Basophils Relative: 0 %
Eosinophils Absolute: 0 10*3/uL (ref 0.0–0.5)
Eosinophils Relative: 0 %
HCT: 26.3 % — ABNORMAL LOW (ref 39.0–52.0)
Hemoglobin: 8.8 g/dL — ABNORMAL LOW (ref 13.0–17.0)
Immature Granulocytes: 1 %
Lymphocytes Relative: 3 %
Lymphs Abs: 0.5 10*3/uL — ABNORMAL LOW (ref 0.7–4.0)
MCH: 33.2 pg (ref 26.0–34.0)
MCHC: 33.5 g/dL (ref 30.0–36.0)
MCV: 99.2 fL (ref 80.0–100.0)
Monocytes Absolute: 1.1 10*3/uL — ABNORMAL HIGH (ref 0.1–1.0)
Monocytes Relative: 7 %
Neutro Abs: 14.5 10*3/uL — ABNORMAL HIGH (ref 1.7–7.7)
Neutrophils Relative %: 89 %
Platelets: 127 10*3/uL — ABNORMAL LOW (ref 150–400)
RBC: 2.65 MIL/uL — ABNORMAL LOW (ref 4.22–5.81)
RDW: 15.3 % (ref 11.5–15.5)
WBC: 16.3 10*3/uL — ABNORMAL HIGH (ref 4.0–10.5)
nRBC: 0 % (ref 0.0–0.2)

## 2021-09-12 LAB — URINALYSIS, ROUTINE W REFLEX MICROSCOPIC
Bacteria, UA: NONE SEEN
Bilirubin Urine: NEGATIVE
Glucose, UA: NEGATIVE mg/dL
Hgb urine dipstick: NEGATIVE
Ketones, ur: NEGATIVE mg/dL
Nitrite: NEGATIVE
Protein, ur: NEGATIVE mg/dL
Specific Gravity, Urine: >1.046 — ABNORMAL HIGH (ref 1.005–1.030)
pH: 8 (ref 5.0–8.0)

## 2021-09-12 LAB — LACTIC ACID, PLASMA
Lactic Acid, Venous: 2.9 mmol/L (ref 0.5–1.9)
Lactic Acid, Venous: 2.3 mmol/L (ref 0.5–1.9)
Lactic Acid, Venous: 1.3 mmol/L (ref 0.5–1.9)

## 2021-09-12 LAB — BASIC METABOLIC PANEL
Anion gap: 9 (ref 5–15)
BUN: 12 mg/dL (ref 8–23)
CO2: 24 mmol/L (ref 22–32)
Calcium: 9.1 mg/dL (ref 8.9–10.3)
Chloride: 100 mmol/L (ref 98–111)
Creatinine, Ser: 0.44 mg/dL — ABNORMAL LOW (ref 0.61–1.24)
GFR, Estimated: >60 mL/min (ref >60)
Glucose, Bld: 191 mg/dL — ABNORMAL HIGH (ref 70–99)
Potassium: 3.8 mmol/L (ref 3.5–5.1)
Sodium: 133 mmol/L — ABNORMAL LOW (ref 135–145)

## 2021-09-12 LAB — PROCALCITONIN: Procalcitonin: 0.16 ng/mL

## 2021-09-12 LAB — RESP PANEL BY RT-PCR (FLU A&B, COVID) ARPGX2
Influenza A by PCR: NEGATIVE
Influenza B by PCR: NEGATIVE
SARS Coronavirus 2 by RT PCR: NEGATIVE

## 2021-09-12 MED ORDER — POLYETHYLENE GLYCOL 3350 17 G PO PACK: 17.0000 g | PACK | Freq: Every day | ORAL | Status: DC | PRN

## 2021-09-12 MED ORDER — SODIUM CHLORIDE 0.9 % IV SOLN
2.0000 g | Freq: Once | INTRAVENOUS | Status: AC
  Administered 2021-09-12: 2 g via INTRAVENOUS
  Filled 2021-09-12: qty 12.5

## 2021-09-12 MED ORDER — ONDANSETRON HCL 4 MG/2ML IJ SOLN
4.0000 mg | Freq: Four times a day (QID) | INTRAMUSCULAR | Status: DC | PRN
  Administered 2021-09-13 – 2021-09-17 (×5): 4 mg via INTRAVENOUS
  Filled 2021-09-12 (×6): qty 2

## 2021-09-12 MED ORDER — SODIUM CHLORIDE 0.9 % IV SOLN: INTRAVENOUS | Status: DC

## 2021-09-12 MED ORDER — METRONIDAZOLE 500 MG/100ML IV SOLN
500.0000 mg | Freq: Two times a day (BID) | INTRAVENOUS | Status: DC
  Administered 2021-09-12 – 2021-09-18 (×11): 500 mg via INTRAVENOUS
  Filled 2021-09-12 (×11): qty 100

## 2021-09-12 MED ORDER — VANCOMYCIN HCL 1750 MG/350ML IV SOLN
1750.0000 mg | INTRAVENOUS | Status: DC
Start: 2021-09-12 — End: 2021-09-14
  Administered 2021-09-12 – 2021-09-13 (×2): 1750 mg via INTRAVENOUS
  Filled 2021-09-12 (×3): qty 350

## 2021-09-12 MED ORDER — SODIUM CHLORIDE 0.9 % IV SOLN: 2.0000 g | Freq: Once | INTRAVENOUS | Status: DC

## 2021-09-12 MED ORDER — IOHEXOL 300 MG/ML  SOLN
100.0000 mL | Freq: Once | INTRAMUSCULAR | Status: AC | PRN
  Administered 2021-09-12: 100 mL via INTRAVENOUS

## 2021-09-12 MED ORDER — VANCOMYCIN HCL IN DEXTROSE 1-5 GM/200ML-% IV SOLN: 1000.0000 mg | Freq: Once | INTRAVENOUS | Status: DC

## 2021-09-12 MED ORDER — PANTOPRAZOLE SODIUM 40 MG IV SOLR
40.0000 mg | Freq: Two times a day (BID) | INTRAVENOUS | Status: DC
  Administered 2021-09-12 – 2021-09-17 (×12): 40 mg via INTRAVENOUS
  Filled 2021-09-12 (×12): qty 10

## 2021-09-12 MED ORDER — SODIUM CHLORIDE 0.9 % IV BOLUS
1000.0000 mL | Freq: Once | INTRAVENOUS | Status: AC
Start: 2021-09-12 — End: 2021-09-12
  Administered 2021-09-12: 1000 mL via INTRAVENOUS

## 2021-09-12 MED ORDER — ACETAMINOPHEN 650 MG RE SUPP: 650.0000 mg | Freq: Four times a day (QID) | RECTAL | Status: DC | PRN

## 2021-09-12 MED ORDER — ALBUTEROL SULFATE (2.5 MG/3ML) 0.083% IN NEBU
2.5000 mg | INHALATION_SOLUTION | RESPIRATORY_TRACT | Status: DC | PRN
  Administered 2021-09-19 (×2): 2.5 mg via RESPIRATORY_TRACT
  Filled 2021-09-12 (×2): qty 3

## 2021-09-12 MED ORDER — CEFEPIME HCL 2 G IV SOLR
2.0000 g | Freq: Three times a day (TID) | INTRAVENOUS | Status: DC
  Administered 2021-09-12 – 2021-09-18 (×18): 2 g via INTRAVENOUS
  Filled 2021-09-12 (×19): qty 12.5

## 2021-09-12 MED ORDER — VANCOMYCIN HCL 1500 MG/300ML IV SOLN
1500.0000 mg | Freq: Once | INTRAVENOUS | Status: DC
  Filled 2021-09-12: qty 300

## 2021-09-12 MED ORDER — OXYCODONE HCL 5 MG PO TABS
5.0000 mg | ORAL_TABLET | ORAL | Status: DC | PRN
  Administered 2021-09-12 – 2021-09-13 (×2): 5 mg via ORAL
  Filled 2021-09-12 (×2): qty 1

## 2021-09-12 MED ORDER — ACETAMINOPHEN 325 MG PO TABS
650.0000 mg | ORAL_TABLET | Freq: Four times a day (QID) | ORAL | Status: DC | PRN
  Administered 2021-09-12 – 2021-09-17 (×4): 650 mg via ORAL
  Filled 2021-09-12 (×4): qty 2

## 2021-09-12 MED ORDER — ONDANSETRON HCL 4 MG PO TABS: 4.0000 mg | ORAL_TABLET | Freq: Four times a day (QID) | ORAL | Status: DC | PRN

## 2021-09-12 NOTE — ED Notes (Signed)
Pt attempted to use urinal to provide urinalysis sample, but unsuccessful at this time.

## 2021-09-12 NOTE — H&P (View-Only) (Signed)
Referring Provider: Valarie Merino, MD Primary Care Physician:  Lavone Orn, MD Primary Gastroenterologist:  [facility]  Reason for Consultation:  Melena  HPI: Casey Pierce. is a 66 y.o. male with past medical history of pancreatic cancer diagnosed in December 2022, A. fib, and CAD who presented to the ED 09/11/21 with complaints of weakness, fever, and dark stool.  Patient was afebrile in the ED with no obvious source identified in work-up.  No further episodes of dark stool while in the ED.  Rectal exam revealed scant stool in the rectal vault but was heme-negative.  Patient was discharged with instruction to follow-up and return as needed.  Patient returned to the ED 08/19/2021 stating nothing has changed since he was last seen.  Continues to have intermittent fever, black tarry stools at home.  Patient seen and examined in the ED, family at bedside. Just returned from CT scan.  Patient states he has been having intermittent fever at home for about a week, began having dark tarry stools about 3 to 4 days ago.  Highest recorded temperature at home 102.3.  States stools have been loose/pasty.  Reports this happened once before when he started chemotherapy.  He has been having 1 bowel movement daily of dark stools for the last few days.  Last bowel movement yesterday evening, no BM today.    In addition to fever patient reports feeling nauseated and has had decreased appetite for 2 weeks, hardly eating and mostly drinking Boost supplements and other liquids.  Reports history of heartburn, normally takes Pepcid or just drinks water for this but it does occur about 4-5 times a week, usually at night.  Patient denies vomiting, hematemesis, constipation, hematochezia, abdominal pain.  Patient denies alcohol use or smoking.  Denies NSAIDs, takes Tylenol as needed.  Patient is on Eliquis for A-fib and history of stroke at age 39.  Last dose last night.  Denies MI/stroke in the last year.  He does have  history of MI age 66, s/p stent placement.  Last time patient ate was 6 PM yesterday, had Boost supplement.  Patient had blood transfusion about 2 weeks ago, states he received 2 units.  Denies any iron infusions or iron supplementation.   Labs significant for lactic acid 2.9, worsening leukocytosis of 16.3, hemoglobin 8.8 (stable compared to recent labs from this month but low compared to labs done in May).  CXR negative for pneumonia.  Patient underwent ERCP 08/06/2021 -a previously placed biliary stent was totally occluded, suspect mucosal hyperplasia and ingrowth.  This was treated by inserting a new stent inside the old stent.  Bile did not drain but injection of stent proved patency and drainage of contrast.  Last colonoscopy 02/07/2015 - Normal, 10-year recall  Past Medical History:  Diagnosis Date   Atrial fibrillation ([place])    Coronary artery disease    NSTEMI 12/1998 - LHC:  prox 85-90, RCA prox 30-50, EF 65-70 >> EYC:XKGY and BMS to LAD; large Dx jailed by stent (90%), EF 60  // Myoview 7/18: normal perfusion, Low Risk   History of echocardiogram    Echo 7/18: mild LVH, EF 60-65, no RWMA, mild MR, mod LAE   Myocardial infarction [facility])    Pancreatic cancer ([place])    Stroke ([place]) 04/2014    Past Surgical History:  Procedure Laterality Date   BILIARY STENT PLACEMENT N/A 03/28/2021   Procedure: BILIARY STENT PLACEMENT;  Surgeon: Clarene Essex, MD;  Location: [facility];  Service: Endoscopy;  Laterality: N/A;   BILIARY STENT PLACEMENT N/A 08/06/2021   Procedure: BILIARY STENT PLACEMENT;  Surgeon: Gatha Mayer, MD;  Location: [facility];  Service: Gastroenterology;  Laterality: N/A;   CARDIOVERSION N/A 06/20/2015   Procedure: CARDIOVERSION;  Surgeon: [name], MD;  Location: [facility];  Service: Cardiovascular;  Laterality: N/A;   COLONOSCOPY WITH PROPOFOL N/A 02/07/2015   Procedure: COLONOSCOPY WITH PROPOFOL;  Surgeon: [name], MD;  Location: [facility];   Service: Endoscopy;  Laterality: N/A;   CORONARY STENT PLACEMENT  2001   ERCP N/A 03/28/2021   Procedure: ENDOSCOPIC RETROGRADE CHOLANGIOPANCREATOGRAPHY (ERCP);  Surgeon: Clarene Essex, MD;  Location: [facility];  Service: Endoscopy;  Laterality: N/A;   ERCP N/A 08/06/2021   Procedure: ENDOSCOPIC RETROGRADE CHOLANGIOPANCREATOGRAPHY (ERCP);  Surgeon: Gatha Mayer, MD;  Location: [facility];  Service: Gastroenterology;  Laterality: N/A;   ESOPHAGOGASTRODUODENOSCOPY N/A 03/28/2021   Procedure: ESOPHAGOGASTRODUODENOSCOPY (EGD);  Surgeon: Arta Silence, MD;  Location: [facility];  Service: Gastroenterology;  Laterality: N/A;   EUS N/A 03/28/2021   Procedure: FULL UPPER ENDOSCOPIC ULTRASOUND (EUS) RADIAL;  Surgeon: Arta Silence, MD;  Location: [facility];  Service: Gastroenterology;  Laterality: N/A;   FINE NEEDLE ASPIRATION N/A 03/28/2021   Procedure: FINE NEEDLE ASPIRATION (FNA) LINEAR;  Surgeon: Arta Silence, MD;  [facility]: [facility];  Service: Gastroenterology;  Laterality: N/A;   KNEE SURGERY  1972   PORTACATH PLACEMENT N/A 04/18/2021   Procedure: PORT PLACEMENT;  Surgeon: Stark Klein, MD;  Location: [facility];  Service: General;  Laterality: N/A;   SPHINCTEROTOMY  03/28/2021   Procedure: SPHINCTEROTOMY;  Surgeon: Clarene Essex, MD;  Location: [facility];  Service: Endoscopy;;    Prior to Admission medications   Medication Sig Start Date End Date Taking? Authorizing Provider  dronabinol (MARINOL) 5 MG capsule Take 1 capsule (5 mg total) by mouth 2 (two) times daily before a meal. Patient taking differently: Take 10 mg by mouth 2 (two) times daily before a meal. 09/01/21  Yes Truitt Merle, MD  ELIQUIS 5 MG TABS tablet TAKE 1 TABLET BY MOUTH TWICE A DAY Patient taking differently: Take 5 mg by mouth 2 (two) times daily. 08/23/21  Yes Deboraha Sprang, MD  famotidine (PEPCID) 20 MG tablet Take 20 mg by mouth at bedtime as needed for heartburn or indigestion.   Yes  [provider]  lidocaine-prilocaine (EMLA) cream Apply to affected area once Patient taking differently: Apply 1 application  topically daily as needed. Apply to affected area 04/11/21  Yes Truitt Merle, MD  Multiple Vitamin (MULTI-VITAMIN DAILY PO) Take 1 tablet by mouth in the morning and at bedtime.   Yes [provider]  Omega-3 Fatty Acids (FISH OIL PO) Take 1 capsule by mouth daily.   Yes [provider]  prochlorperazine (COMPAZINE) 10 MG tablet Take 1 tablet (10 mg total) by mouth every 6 (six) hours as needed (Nausea or vomiting). 08/23/21  Yes Burns, Wandra Feinstein, NP  urea (CARMOL) 20 % lotion Apply 1 Application topically as needed (for rash).   Yes [provider]    Scheduled Meds: Continuous Infusions: PRN Meds:.  Allergies as of 08/19/2021   (No Known Allergies)    Family History  Problem Relation Age of Onset   Cancer Father 33       tongue/neck   Heart attack Maternal Uncle    Cancer Paternal Uncle        unk type    Social History   Socioeconomic History  Marital status: Married    Spouse name: Patty   Number of children: 3   Years of education: Associate   Highest education level: Not on file  Occupational History   Occupation: [employer]   Tobacco Use   Smoking status: Never   Smokeless tobacco: Former    Types: Chew    Quit date: 03/20/1999   Tobacco comments:    quit chewing 7 years ago  Vaping Use   Vaping Use: Never used  Substance and Sexual Activity   Alcohol use: No    Alcohol/week: 0.0 standard drinks of alcohol   Drug use: No   Sexual activity: Not Currently  Other Topics Concern   Not on file  Social History Narrative   Lives at home with wife.    Caffeine: Drinks coffee- about 3 cups per day   Social Determinants of Health   Financial Resource Strain: Not on file  Food Insecurity: Not on file  Transportation Needs: Not on file  Physical Activity: Not on file  Stress: Not on file  Social  Connections: Not on file  Intimate Partner Violence: Not on file    Review of Systems: Review of Systems  Constitutional:  Positive for chills and fever.  HENT:  Negative for sore throat.   Eyes:  Negative for discharge and redness.  Respiratory:  Negative for shortness of breath, wheezing and stridor.   Cardiovascular:  Negative for chest pain and palpitations.  Gastrointestinal:  Positive for heartburn, melena and nausea. Negative for abdominal pain, blood in stool, constipation, diarrhea and vomiting.  Genitourinary:  Negative for dysuria and urgency.  Musculoskeletal:  Negative for falls and joint pain.  Skin:  Negative for itching and rash.  Neurological:  Negative for seizures and loss of consciousness.  Endo/Heme/Allergies:  Negative for environmental allergies and polydipsia.  Psychiatric/Behavioral:  Negative for memory loss. The patient does not have insomnia.      Physical Exam: Physical Exam Constitutional:      General: He is not in acute distress. HENT:     Head: Normocephalic and atraumatic.     Right Ear: External ear normal.     Left Ear: External ear normal.     Nose: Nose normal.     Mouth/Throat:     Mouth: Mucous membranes are moist.     Pharynx: Oropharynx is clear.  Eyes:     General: No scleral icterus.    Extraocular Movements: Extraocular movements intact.     Conjunctiva/sclera: Conjunctivae normal.  Cardiovascular:     Rate and Rhythm: Normal rate. Rhythm irregular.     Pulses: Normal pulses.     Heart sounds: Normal heart sounds. No murmur heard. Pulmonary:     Effort: Pulmonary effort is normal.     Breath sounds: Normal breath sounds.  Abdominal:     General: Bowel sounds are normal. There is no distension.     Palpations: Abdomen is soft.     Tenderness: There is no abdominal tenderness. There is no guarding.  Musculoskeletal:     Cervical back: Normal range of motion and neck supple.     Right lower leg: No edema.     Left lower leg: No  edema.  Skin:    General: Skin is warm and dry.     Coloration: Skin is jaundiced.  Neurological:     General: No focal deficit present.     Mental Status: He is alert and oriented to person, place, and time.  Psychiatric:        Mood and Affect: Mood normal.        Behavior: Behavior normal.      Vital signs: Vitals:   08/21/2021 0900 09/01/2021 1000  BP: 110/72 111/68  Pulse: 89 88  Resp: 18 18  Temp:    SpO2: 99% 99%        GI:  Lab Results: Recent Labs    09/10/21 2300 08/30/2021 0540  WBC 12.8* 16.3*  HGB 8.7* 8.8*  HCT 26.4* 26.3*  PLT 114* 127*   BMET Recent Labs    09/10/21 2300 09/01/2021 0540  NA 132* 133*  K 3.7 3.8  CL 100 100  CO2 25 24  GLUCOSE 251* 191*  BUN 13 12  CREATININE 0.46* 0.44*  CALCIUM 8.8* 9.1   LFT Recent Labs    09/10/21 2300  PROT 6.0*  ALBUMIN 2.3*  AST 65*  ALT 113*  ALKPHOS 103  BILITOT 0.6   PT/INR Recent Labs    09/10/21 2300  LABPROT 17.6*  INR 1.5*     Studies/Results: DG Chest 2 View  Result Date: 09/05/2021 CLINICAL DATA:  Fever and weakness EXAM: CHEST - 2 VIEW COMPARISON:  09/10/2021 FINDINGS: Left porta catheter with tip at the upper cavoatrial junction. Normal heart size and mediastinal contours. Coronary stenting. No acute infiltrate or edema. No effusion or pneumothorax. No acute osseous findings. Artifact from EKG leads. IMPRESSION: Negative for pneumonia. Electronically Signed   By: Jorje Guild M.D.   On: 08/30/2021 06:27   DG Chest 2 View  Result Date: 09/10/2021 CLINICAL DATA:  Suspected sepsis EXAM: CHEST - 2 VIEW COMPARISON:  04/18/2021 FINDINGS: Left Port-A-Cath remains in place, unchanged. Heart and mediastinal contours are within normal limits. No focal opacities or effusions. No acute bony abnormality. IMPRESSION: No active cardiopulmonary disease. Electronically Signed   By: Rolm Baptise M.D.   On: 09/10/2021 23:34    Impression: Melena - Hgb 8.8, stable - No bowel movement since  presentation to ED.  Last reported dark stool yesterday evening. - Patient is on Eliquis, last dose was last night.  Fever of unknown origin - WBC 16.3 - Last recorded temp 99.2 - CT A/P 08/22/2021, results pending  Pancreatic cancer - Diagnosed December 2022, now follows with oncology - S/p ERCP with stent placement May 2023 - Scheduled for surgery 09/20/2021  Plan: Case discussed with Dr. Alessandra Bevels who will see patient later today. Patient reporting melena at home. Hemoglobin currently stable but he does have history of recent blood transfusion. Plan for EGD tomorrow. I thoroughly discussed the procedure to include nature, alternatives, benefits, and risks including but not limited to bleeding, perforation, infection, anesthesia/cardiac and pulmonary complications. Patient provides understanding and gave verbal consent to proceed. Hold Eliquis in preparation for procedure. Protonix IV '40mg'$  BID Full liquid diet today. NPO at midnight. Anti-emetics and supportive care as needed. [facility] will follow.       LOS: 0 days   Angelique Holm  PA-C 09/11/2021, 10:03 AM  Contact #  [phone]

## 2021-09-12 NOTE — ED Provider Notes (Signed)
[facility] DEPT Provider Note   CSN: [id number] Arrival date & time: 09/05/2021  0530     History  Chief Complaint  Patient presents with   Fever    Casey Pierce. is a 66 y.o. male.  Pt's family reports he has had a fever yesterday and today.  Pt took tylenol at 9pm yesteray.  Pt reports his temperature was 102 at 9pm.   Pt reports he has also had dark stools.  Pt is schedule to have an mri and is scheduled to have surgery on July 5th.  Pt is suppose to have a whipple procedure.  Pt has pancreatic cancer.    The history is provided by the patient. No language interpreter was used.  Fever Max temp prior to arrival:  102 Temp source:  Oral Onset quality:  Gradual Duration:  2 days Timing:  Constant Progression:  Worsening Chronicity:  New Relieved by:  Acetaminophen Worsened by:  Nothing Associated symptoms: no chest pain, no congestion, no cough, no diarrhea, no rash and no rhinorrhea   Risk factors: no contaminated food        Home Medications Prior to Admission medications   Medication Sig Start Date End Date Taking? Authorizing Provider  dronabinol (MARINOL) 5 MG capsule Take 1 capsule (5 mg total) by mouth 2 (two) times daily before a meal. Patient taking differently: Take 10 mg by mouth 2 (two) times daily before a meal. 09/01/21  Yes Truitt Merle, MD  ELIQUIS 5 MG TABS tablet TAKE 1 TABLET BY MOUTH TWICE A DAY Patient taking differently: Take 5 mg by mouth 2 (two) times daily. 08/23/21  Yes Deboraha Sprang, MD  famotidine (PEPCID) 20 MG tablet Take 20 mg by mouth at bedtime as needed for heartburn or indigestion.   Yes [provider]  lidocaine-prilocaine (EMLA) cream Apply to affected area once Patient taking differently: Apply 1 application  topically daily as needed. Apply to affected area 04/11/21  Yes Truitt Merle, MD  Multiple Vitamin (MULTI-VITAMIN DAILY PO) Take 1 tablet by mouth in the morning and at bedtime.   Yes  [provider]  Omega-3 Fatty Acids (FISH OIL PO) Take 1 capsule by mouth daily.   Yes [provider]  prochlorperazine (COMPAZINE) 10 MG tablet Take 1 tablet (10 mg total) by mouth every 6 (six) hours as needed (Nausea or vomiting). 08/23/21  Yes Burns, Wandra Feinstein, NP  urea (CARMOL) 20 % lotion Apply 1 Application topically as needed (for rash).   Yes [provider]      Allergies    Patient has no known allergies.    Review of Systems   Review of Systems  Constitutional:  Positive for fever.  HENT:  Negative for congestion and rhinorrhea.   Respiratory:  Negative for cough.   Cardiovascular:  Negative for chest pain.  Gastrointestinal:  Negative for diarrhea.  Skin:  Negative for rash.  All other systems reviewed and are negative.   Physical Exam Updated Vital Signs BP 110/76   Pulse 92   Temp 99.2 F (37.3 C) (Oral)   Resp 18   Ht '5\' 5"'$  (1.651 m)   Wt 72.6 kg   SpO2 99%   BMI 26.63 kg/m  Physical Exam Vitals and nursing note reviewed.  Constitutional:      General: He is not in acute distress.    Appearance: He is well-developed.  HENT:     Head: Normocephalic and atraumatic.  Mouth/Throat:     Mouth: Mucous membranes are moist.  Eyes:     Conjunctiva/sclera: Conjunctivae normal.  Cardiovascular:     Rate and Rhythm: Normal rate and regular rhythm.     Heart sounds: No murmur heard. Pulmonary:     Effort: Pulmonary effort is normal. No respiratory distress.     Breath sounds: Normal breath sounds.  Abdominal:     Palpations: Abdomen is soft.     Tenderness: There is no abdominal tenderness.  Musculoskeletal:        General: No swelling. Normal range of motion.     Cervical back: Neck supple.  Skin:    General: Skin is warm and dry.     Capillary Refill: Capillary refill takes less than 2 seconds.  Neurological:     General: No focal deficit present.     Mental Status: He is alert.  Psychiatric:        Mood and Affect:  Mood normal.     ED Results / Procedures / Treatments   Labs (all labs ordered are listed, but only abnormal results are displayed) Labs Reviewed  CBC WITH DIFFERENTIAL/PLATELET - Abnormal; Notable for the following components:      Result Value   WBC 16.3 (*)    RBC 2.65 (*)    Hemoglobin 8.8 (*)    HCT 26.3 (*)    Platelets 127 (*)    Neutro Abs 14.5 (*)    Lymphs Abs 0.5 (*)    Monocytes Absolute 1.1 (*)    Abs Immature Granulocytes 0.16 (*)    All other components within normal limits  BASIC METABOLIC PANEL - Abnormal; Notable for the following components:   Sodium 133 (*)    Glucose, Bld 191 (*)    Creatinine, Ser 0.44 (*)    All other components within normal limits  URINALYSIS, ROUTINE W REFLEX MICROSCOPIC - Abnormal; Notable for the following components:   Specific Gravity, Urine >1.046 (*)    Leukocytes,Ua SMALL (*)    All other components within normal limits  LACTIC ACID, PLASMA - Abnormal; Notable for the following components:   Lactic Acid, Venous 2.9 (*)    All other components within normal limits  RESP PANEL BY RT-PCR (FLU A&B, COVID) [id number]  LACTIC ACID, PLASMA    EKG EKG Interpretation  Date/Time:  Tuesday September 12 2021 05:41:23 EDT Ventricular Rate:  124 PR Interval:    QRS Duration: 117 QT Interval:  342 QTC Calculation: 492 R Axis:   255 Text Interpretation: Atrial fibrillation RBBB and LAFB Low voltage, extremity and precordial leads Confirmed by Randal Buba, April ([id number]) on 08/23/2021 5:42:37 AM  Radiology CT CHEST ABDOMEN PELVIS W CONTRAST  Result Date: 08/25/2021 CLINICAL DATA:  Pancreatic cancer; intermittent fever and black tarry stools; * Tracking Code: BO * EXAM: CT CHEST, ABDOMEN, AND PELVIS WITH CONTRAST TECHNIQUE: Multidetector CT imaging of the chest, abdomen and pelvis was performed following the standard protocol during bolus administration of intravenous contrast. RADIATION DOSE REDUCTION: This exam was performed according to the  departmental dose-optimization program which includes automated exposure control, adjustment of the mA and/or kV according to patient size and/or use of iterative reconstruction technique. CONTRAST:  172m OMNIPAQUE IOHEXOL 300 MG/ML  SOLN COMPARISON:  CT abdomen and pelvis dated Aug 05, 2021 FINDINGS: CT CHEST FINDINGS Cardiovascular: Normal heart size. No pericardial effusion. Severe three-vessel coronary artery calcifications. Normal caliber thoracic aorta with mild atherosclerotic disease. Mediastinum/Nodes: Small hiatal hernia. Thyroid is unremarkable. No pathologically enlarged lymph  nodes seen in the chest. Lungs/Pleura: Central airways are patent. No consolidation, pleural effusion or pneumothorax. Bibasilar atelectasis. Musculoskeletal: No chest wall mass or suspicious bone lesions identified. CT ABDOMEN PELVIS FINDINGS Hepatobiliary: Numerous new ill-defined peripherally enhancing liver lesions are seen. Reference lesion of the left hepatic lobe measuring 15 x 13 mm on series 2, image 55. Reference new lesion of the right hepatic lobe measuring 17 x 16 mm on image 65. Decompressed gallbladder with mild wall thickening. Common bile duct stents in place with associated pneumobilia. No intrahepatic biliary ductal dilation. Pancreas: Ill-defined masslike area of the pancreatic head measuring 3.2 x 3.6 cm on image 68, new when compared to the prior exam. Spleen: Normal in size without focal abnormality. Adrenals/Urinary Tract: Adrenal glands are unremarkable. Kidneys are normal, without renal calculi, focal lesion, or hydronephrosis. Bladder is unremarkable. Stomach/Bowel: Stomach is within normal limits. Appendix appears normal. Stool ball in the rectum with no rectal wall thickening. Mild wall thickening of the duodenum. No evidence of obstruction. Vascular/Lymphatic: Aortic atherosclerosis. No enlarged abdominal or pelvic lymph nodes. Reproductive: Mild prostatomegaly. Other: New trace ascites.  No free air.  Musculoskeletal: No acute or significant osseous findings. IMPRESSION: 1. New masslike area of the pancreatic head, concerning for progressive disease. 2. Multiple new irregular peripherally enhancing liver lesions, concerning for hepatic metastatic disease. 3. New trace abdominal ascites, concerning for peritoneal carcinomatosis. 4. Mild wall thickening of the duodenum, findings can be seen in the setting of duodenitis. 5. Common bile duct stents in place with associated pneumobilia. No intrahepatic biliary ductal dilation. 6. Decompressed gallbladder with mild wall thickening, likely reactive. If there is clinical concern for acute cholecystitis, gallbladder ultrasound could be performed for further evaluation. 7. Aortic Atherosclerosis (ICD10-I70.0). Electronically Signed   By: Yetta Glassman M.D.   On: 09/07/2021 11:20   DG Chest 2 View  Result Date: 09/02/2021 CLINICAL DATA:  Fever and weakness EXAM: CHEST - 2 VIEW COMPARISON:  09/10/2021 FINDINGS: Left porta catheter with tip at the upper cavoatrial junction. Normal heart size and mediastinal contours. Coronary stenting. No acute infiltrate or edema. No effusion or pneumothorax. No acute osseous findings. Artifact from EKG leads. IMPRESSION: Negative for pneumonia. Electronically Signed   By: Jorje Guild M.D.   On: 09/04/2021 06:27   DG Chest 2 View  Result Date: 09/10/2021 CLINICAL DATA:  Suspected sepsis EXAM: CHEST - 2 VIEW COMPARISON:  04/18/2021 FINDINGS: Left Port-A-Cath remains in place, unchanged. Heart and mediastinal contours are within normal limits. No focal opacities or effusions. No acute bony abnormality. IMPRESSION: No active cardiopulmonary disease. Electronically Signed   By: Rolm Baptise M.D.   On: 09/10/2021 23:34    Procedures Procedures    Medications Ordered in ED Medications  pantoprazole (PROTONIX) injection 40 mg (40 mg Intravenous Given 09/01/2021 1221)  sodium chloride 0.9 % bolus 1,000 mL (1,000 mLs Intravenous  New Bag/Given 09/07/2021 0807)  iohexol (OMNIPAQUE) 300 MG/ML solution 100 mL (100 mLs Intravenous Contrast Given 09/11/2021 1014)    ED Course/ Medical Decision Making/ A&P Clinical Course as of 08/25/2021 1433  Tue Sep 12, 2021  0758 DG Chest 2 View [JB]    Clinical Course User Index [JB] Bonnielee Haff, Student-PA                           Medical Decision Making Pt has had a fever fr 2 days.  Pt has also had some black stools   Amount and/or Complexity of  Data Reviewed Independent Historian: spouse    Details: Pt here with family.  Family is supportive Labs: ordered. Decision-making details documented in ED Course.    Details: pt has a wbc count of 16.3.  Pt has an elevated lactic acid of 2.9 Radiology: ordered and independent interpretation performed. Decision-making details documented in ED Course.    Details: Ct chest abdomen and pelvis show new masslike area pancreatic head multiple new irregular liver lisesions ECG/medicine tests: ordered and independent interpretation performed. Discussion of management or test interpretation with external provider(s): [facility] consulted on pt here. Hospitalist  consulted for admission   Risk Prescription drug management.           Final Clinical Impression(s) / ED Diagnoses Final diagnoses:  Fever, unspecified fever cause  Malignant neoplasm of pancreas, unspecified location of malignancy [facility])    Rx / [facility] Orders ED Discharge Orders     None         Sidney Ace 08/23/2021 1509    Valarie Merino, MD 08/18/2021 1258

## 2021-09-13 ENCOUNTER — Inpatient Hospital Stay (HOSPITAL_COMMUNITY): Payer: Medicare PPO

## 2021-09-13 ENCOUNTER — Encounter (HOSPITAL_COMMUNITY): Payer: Self-pay | Admitting: Internal Medicine

## 2021-09-13 ENCOUNTER — Ambulatory Visit (HOSPITAL_COMMUNITY): Admission: RE | Admit: 2021-09-13 | Payer: Medicare PPO | Source: Ambulatory Visit

## 2021-09-13 ENCOUNTER — Inpatient Hospital Stay (HOSPITAL_COMMUNITY): Payer: Medicare PPO | Admitting: Certified Registered"

## 2021-09-13 ENCOUNTER — Encounter (HOSPITAL_COMMUNITY): Admission: EM | Disposition: E | Payer: Self-pay | Source: Home / Self Care | Attending: Internal Medicine

## 2021-09-13 DIAGNOSIS — Z87891 Personal history of nicotine dependence: Secondary | ICD-10-CM

## 2021-09-13 DIAGNOSIS — K2980 Duodenitis without bleeding: Secondary | ICD-10-CM

## 2021-09-13 DIAGNOSIS — I252 Old myocardial infarction: Secondary | ICD-10-CM

## 2021-09-13 DIAGNOSIS — I4821 Permanent atrial fibrillation: Secondary | ICD-10-CM | POA: Diagnosis not present

## 2021-09-13 DIAGNOSIS — D638 Anemia in other chronic diseases classified elsewhere: Secondary | ICD-10-CM

## 2021-09-13 DIAGNOSIS — A419 Sepsis, unspecified organism: Secondary | ICD-10-CM | POA: Diagnosis not present

## 2021-09-13 DIAGNOSIS — C259 Malignant neoplasm of pancreas, unspecified: Secondary | ICD-10-CM | POA: Diagnosis not present

## 2021-09-13 DIAGNOSIS — E871 Hypo-osmolality and hyponatremia: Secondary | ICD-10-CM

## 2021-09-13 DIAGNOSIS — E872 Acidosis, unspecified: Secondary | ICD-10-CM

## 2021-09-13 DIAGNOSIS — D72829 Elevated white blood cell count, unspecified: Secondary | ICD-10-CM

## 2021-09-13 DIAGNOSIS — K2101 Gastro-esophageal reflux disease with esophagitis, with bleeding: Secondary | ICD-10-CM | POA: Diagnosis not present

## 2021-09-13 DIAGNOSIS — D696 Thrombocytopenia, unspecified: Secondary | ICD-10-CM

## 2021-09-13 DIAGNOSIS — K2991 Gastroduodenitis, unspecified, with bleeding: Secondary | ICD-10-CM

## 2021-09-13 DIAGNOSIS — C250 Malignant neoplasm of head of pancreas: Secondary | ICD-10-CM | POA: Diagnosis not present

## 2021-09-13 DIAGNOSIS — I2510 Atherosclerotic heart disease of native coronary artery without angina pectoris: Secondary | ICD-10-CM

## 2021-09-13 HISTORY — PX: ESOPHAGOGASTRODUODENOSCOPY: SHX5428

## 2021-09-13 LAB — COMPREHENSIVE METABOLIC PANEL
ALT: 89 U/L — ABNORMAL HIGH (ref 0–44)
AST: 38 U/L (ref 15–41)
Albumin: 2.1 g/dL — ABNORMAL LOW (ref 3.5–5.0)
Alkaline Phosphatase: 98 U/L (ref 38–126)
Anion gap: 6 (ref 5–15)
BUN: 11 mg/dL (ref 8–23)
CO2: 23 mmol/L (ref 22–32)
Calcium: 9.2 mg/dL (ref 8.9–10.3)
Chloride: 105 mmol/L (ref 98–111)
Creatinine, Ser: 0.46 mg/dL — ABNORMAL LOW (ref 0.61–1.24)
GFR, Estimated: >60 mL/min (ref >60)
Glucose, Bld: 158 mg/dL — ABNORMAL HIGH (ref 70–99)
Potassium: 3.9 mmol/L (ref 3.5–5.1)
Sodium: 134 mmol/L — ABNORMAL LOW (ref 135–145)
Total Bilirubin: 0.8 mg/dL (ref 0.3–1.2)
Total Protein: 5.5 g/dL — ABNORMAL LOW (ref 6.5–8.1)

## 2021-09-13 LAB — CBC
HCT: 23.9 % — ABNORMAL LOW (ref 39.0–52.0)
Hemoglobin: 7.8 g/dL — ABNORMAL LOW (ref 13.0–17.0)
MCH: 32.8 pg (ref 26.0–34.0)
MCHC: 32.6 g/dL (ref 30.0–36.0)
MCV: 100.4 fL — ABNORMAL HIGH (ref 80.0–100.0)
Platelets: 112 10*3/uL — ABNORMAL LOW (ref 150–400)
RBC: 2.38 MIL/uL — ABNORMAL LOW (ref 4.22–5.81)
RDW: 15.5 % (ref 11.5–15.5)
WBC: 15.1 10*3/uL — ABNORMAL HIGH (ref 4.0–10.5)
nRBC: 0 % (ref 0.0–0.2)

## 2021-09-13 LAB — PROTIME-INR
INR: 1.4 — ABNORMAL HIGH (ref 0.8–1.2)
Prothrombin Time: 16.7 seconds — ABNORMAL HIGH (ref 11.4–15.2)

## 2021-09-13 LAB — HIV ANTIBODY (ROUTINE TESTING W REFLEX): HIV Screen 4th Generation wRfx: NONREACTIVE

## 2021-09-13 SURGERY — EGD (ESOPHAGOGASTRODUODENOSCOPY)
Anesthesia: Monitor Anesthesia Care

## 2021-09-13 MED ORDER — LACTATED RINGERS IV SOLN: INTRAVENOUS | Status: DC | PRN

## 2021-09-13 MED ORDER — PROPOFOL 500 MG/50ML IV EMUL
INTRAVENOUS | Status: DC | PRN
  Administered 2021-09-13: 100 ug/kg/min via INTRAVENOUS

## 2021-09-13 MED ORDER — LACTATED RINGERS IV SOLN: INTRAVENOUS | Status: DC

## 2021-09-13 MED ORDER — PHENYLEPHRINE 80 MCG/ML (10ML) SYRINGE FOR IV PUSH (FOR BLOOD PRESSURE SUPPORT)
PREFILLED_SYRINGE | INTRAVENOUS | Status: DC | PRN
  Administered 2021-09-13: 80 ug via INTRAVENOUS
  Administered 2021-09-13: 160 ug via INTRAVENOUS

## 2021-09-13 MED ORDER — LIDOCAINE 2% (20 MG/ML) 5 ML SYRINGE
INTRAMUSCULAR | Status: DC | PRN
  Administered 2021-09-13: 40 mg via INTRAVENOUS

## 2021-09-13 MED ORDER — PROPOFOL 10 MG/ML IV BOLUS
INTRAVENOUS | Status: DC | PRN
  Administered 2021-09-13: 40 mg via INTRAVENOUS

## 2021-09-13 NOTE — Anesthesia Preprocedure Evaluation (Signed)
Anesthesia Evaluation  Patient identified by MRN, date of birth, ID band Patient awake    Reviewed: Allergy & Precautions, NPO status , Patient's Chart, lab work & pertinent test results  History of Anesthesia Complications Negative for: history of anesthetic complications  Airway Mallampati: II  TM Distance: >3 FB Neck ROM: Full    Dental   Pulmonary neg pulmonary ROS,    Pulmonary exam normal        Cardiovascular + CAD, + Past MI and + Cardiac Stents  Normal cardiovascular exam+ dysrhythmias Atrial Fibrillation    MPS 08/15/21: low risk, normal study; no evidence of ischemia or infarction; A fib throughout; EF 55-65%; normal wall motion   Neuro/Psych CVA (2016)    GI/Hepatic negative GI ROS, Neg liver ROS,   Endo/Other  negative endocrine ROS  Renal/GU negative Renal ROS  negative genitourinary   Musculoskeletal negative musculoskeletal ROS (+)   Abdominal   Peds  Hematology  (+) Blood dyscrasia, anemia ,   Anesthesia Other Findings   Reproductive/Obstetrics                             Anesthesia Physical Anesthesia Plan  ASA: 3  Anesthesia Plan: MAC   Post-op Pain Management: Minimal or no pain anticipated   Induction: Intravenous  PONV Risk Score and Plan: 1 and Propofol infusion, TIVA and Treatment may vary due to age or medical condition  Airway Management Planned: Natural Airway, Nasal Cannula and Simple Face Mask  Additional Equipment: None  Intra-op Plan:   Post-operative Plan:   Informed Consent: I have reviewed the patients History and Physical, chart, labs and discussed the procedure including the risks, benefits and alternatives for the proposed anesthesia with the patient or authorized representative who has indicated his/her understanding and acceptance.       Plan Discussed with:   Anesthesia Plan Comments:         Anesthesia Quick Evaluation

## 2021-09-13 NOTE — Progress Notes (Signed)
PROGRESS NOTE    Casey Pierce.  IRC:[id number] DOB: 1955/05/23 DOA: 09/04/2021 PCP: Lavone Orn, MD   Brief Narrative:  66 y.o. male with medical history significant of CAD s/p stents, CVA, permanent A-fib, pancreatic CA recently completed chemotherapy  presented with fever, abdominal pain, fatigue, poor appetite and nausea with melena.  On presentation, he had a temperature of 100.6, was tachycardic.  WBC of 16.3, lactic acid 2.9, influenza and COVID-19 test negative.  CT abdomen/chest/pelvis showed new masslike area of the pancreatic head concerning for progressive disease, metastatic disease, new concerns for peritoneal carcinomatosis, duodenitis.  GI was consulted.  Assessment & Plan:   Sepsis: Present on admission, unknown source -Presented with fever, leukocytosis, tachycardia, lactic acidosis -No source of sepsis identified yet.  Unclear if this is secondary to cancer as well.  Procalcitonin 0.16 on presentation.  UA unremarkable. CT abdomen/chest/pelvis showed new masslike area of the pancreatic head concerning for progressive disease, metastatic disease, new concerns for peritoneal carcinomatosis, duodenitis with some gallbladder thickening, possibly reactive -currently on broad-spectrum antibiotics. -Cultures negative so far.  COVID-19 and influenza negative on presentation. -Continue IV fluids  Possible upper GI bleeding Possible duodenitis -Reported melanotic stools.  FOBT negative.  Hemoglobin 7.8 today.  Monitor H&H. -Eliquis on hold. -Continue IV Protonix twice daily. -GI planning for EGD today  Lactic acidosis -From infection with dehydration.  Improved.  Hyponatremia -Mild. continue IV fluids. -monitor  Anemia of chronic disease -Hemoglobin currently stable.  Transfuse if hemoglobin is less than 7  Permanent A-fib -Currently rate controlled.  Eliquis on hold.  Not on any rate controlling medication.  Pancreatic cancer with possible metastases Mildly  elevated LFTs -CT with possible new progression. -Oncology following.  Might need liver biopsy at some point. -Whipple's surgery planned for next week has been canceled  Thrombocytopenia -Possibly from cancer.  No signs of bleeding.  Monitor.    DVT prophylaxis: SCDs Code Status: Full Family Communication: Wife and daughter at bedside Disposition Plan: Status is: Inpatient Remains inpatient appropriate because: Of severity of illness    Consultants: GI/oncology  Procedures: None  Antimicrobials: None   Subjective: Patient seen and examined at bedside.  Had fever last night.  Denies any current nausea, worsening abdominal pain, vomiting, chest pain or shortness of breath.  Objective: Vitals:   09/04/2021 1915 08/24/2021 2032 08/20/2021 0424 09/10/2021 0836  BP: 101/70 115/65 113/71 122/83  Pulse: 97 95 83 (!) 106  Resp: 20 (!) '22 20 18  '$ Temp: (!) 100.8 F (38.2 C) 99.5 F (37.5 C) 98.4 F (36.9 C) 100 F (37.8 C)  TempSrc: Oral Oral Oral Oral  SpO2: 97% 99% 98% 99%  Weight:      Height:        Intake/Output Summary (Last 24 hours) at 08/21/2021 1201 Last data filed at 09/05/2021 1100 Gross per 24 hour  Intake 1982.97 ml  Output --  Net 1982.97 ml   Filed Weights   09/05/2021 0536  Weight: 72.6 kg    Examination:  General exam: Appears calm and comfortable.  Currently on room air.  Looks chronically ill and deconditioned. Respiratory system: Bilateral decreased breath sounds at bases with some scattered crackles Cardiovascular system: S1 & S2 heard, Rate controlled Gastrointestinal system: Abdomen is nondistended, soft and nontender. Normal bowel sounds heard. Extremities: No cyanosis, clubbing; trace lower extremity edema present  Central nervous system: Awake, slow to respond.  No focal neurological deficits. Moving extremities Skin: No rashes, lesions or ulcers Psychiatry: Flat affect.  No signs of agitation    Data Reviewed: I have personally reviewed  following labs and imaging studies  CBC: Recent Labs  Lab 09/10/21 2300 09/15/2021 0540 08/30/2021 0526  WBC 12.8* 16.3* 15.1*  NEUTROABS 10.7* 14.5*  --   HGB 8.7* 8.8* 7.8*  HCT 26.4* 26.3* 23.9*  MCV 99.2 99.2 100.4*  PLT 114* 127* 657*   Basic Metabolic Panel: Recent Labs  Lab 09/10/21 2300 09/14/2021 0540 09/11/2021 0526  NA 132* 133* 134*  K 3.7 3.8 3.9  CL 100 100 105  CO2 '25 24 23  '$ GLUCOSE 251* 191* 158*  BUN '13 12 11  '$ CREATININE 0.46* 0.44* 0.46*  CALCIUM 8.8* 9.1 9.2   GFR: Estimated Creatinine Clearance: 77.9 mL/min (A) (by C-G formula based on SCr of 0.46 mg/dL (L)). Liver Function Tests: Recent Labs  Lab 09/10/21 2300 09/14/2021 1630 08/25/2021 0526  AST 65* 58* 38  ALT 113* 113* 89*  ALKPHOS 103 103 98  BILITOT 0.6 0.9 0.8  PROT 6.0* 5.9* 5.5*  ALBUMIN 2.3* 2.3* 2.1*   No results for input(s): "LIPASE", "AMYLASE" in the last 168 hours. No results for input(s): "AMMONIA" in the last 168 hours. Coagulation Profile: Recent Labs  Lab 09/10/21 2300 08/22/2021 0526  INR 1.5* 1.4*   Cardiac Enzymes: No results for input(s): "CKTOTAL", "CKMB", "CKMBINDEX", "TROPONINI" in the last 168 hours. BNP (last 3 results) No results for input(s): "PROBNP" in the last 8760 hours. HbA1C: No results for input(s): "HGBA1C" in the last 72 hours. CBG: Recent Labs  Lab 09/11/21 0311  GLUCAP 141*   Lipid Profile: No results for input(s): "CHOL", "HDL", "LDLCALC", "TRIG", "CHOLHDL", "LDLDIRECT" in the last 72 hours. Thyroid Function Tests: No results for input(s): "TSH", "T4TOTAL", "FREET4", "T3FREE", "THYROIDAB" in the last 72 hours. Anemia Panel: No results for input(s): "VITAMINB12", "FOLATE", "FERRITIN", "TIBC", "IRON", "RETICCTPCT" in the last 72 hours. Sepsis Labs: Recent Labs  Lab 09/11/21 0131 08/29/2021 0600 09/02/2021 1630 08/25/2021 2104  PROCALCITON  --   --  0.16  --   LATICACIDVEN 1.4 2.9* 2.3* 1.3    Recent Results (from the past 240 hour(s))  Culture,  blood (Routine x 2)     Status: None (Preliminary result)   Collection Time: 09/10/21 11:00 PM   Specimen: BLOOD  Result Value Ref Range Status   Specimen Description   Final    BLOOD LEFT ANTECUBITAL Performed at [facility], [place] [address], [place], [place] [address]    Special Requests   Final    BOTTLES DRAWN AEROBIC AND ANAEROBIC Blood Culture adequate volume Performed at [facility] [address], [place], [place] [address]    Culture   Final    NO GROWTH 2 DAYS Performed at [facility] [address], [place], [place] [address]    Report Status PENDING  Incomplete  Culture, blood (Routine x 2)     Status: None (Preliminary result)   Collection Time: 09/10/21 11:00 PM   Specimen: BLOOD  Result Value Ref Range Status   Specimen Description   Final    BLOOD LEFT ANTECUBITAL Performed at [facility] [address] [place], [place] [address]    Special Requests   Final    BOTTLES DRAWN AEROBIC AND ANAEROBIC Blood Culture adequate volume Performed at [facility] [address] [place], [place] [address]    Culture   Final    NO GROWTH 2 DAYS Performed at [facility],  [address] [place], [place] [address]    Report Status PENDING  Incomplete  Blood culture (routine x 2)     Status: None (Preliminary result)   Collection Time: 08/26/2021  5:40 AM   Specimen: Right Antecubital; Blood  Result Value Ref Range Status   Specimen Description   Final    RIGHT ANTECUBITAL Performed at [facility] [address], [place], [place] [address]    Special Requests   Final    BOTTLES DRAWN AEROBIC AND ANAEROBIC Blood Culture results may not be optimal due to an excessive volume of blood received in culture bottles   Culture   Final    NO GROWTH < 12 HOURS Performed at [facility] [address], [place], [place] [address]    Report Status PENDING   Incomplete  Blood culture (routine x 2)     Status: None (Preliminary result)   Collection Time: 09/01/2021  6:00 AM   Specimen: BLOOD LEFT WRIST  Result Value Ref Range Status   Specimen Description   Final    BLOOD LEFT WRIST Performed at [facility] [address], [place], [place] [address]    Special Requests   Final    BOTTLES DRAWN AEROBIC AND ANAEROBIC Blood Culture results may not be optimal due to an excessive volume of blood received in culture bottles   Culture   Final    NO GROWTH < 12 HOURS Performed at [facility] [address] [place],  [address]    Report Status PENDING  Incomplete  Resp Panel by RT-PCR (Flu A&B, Covid) Anterior Nasal Swab     Status: None   Collection Time: 08/23/2021  8:01 AM   Specimen: Anterior Nasal Swab  Result Value Ref Range Status   SARS Coronavirus 2 by RT PCR NEGATIVE NEGATIVE Final    Comment: (NOTE) SARS-CoV-2 target nucleic acids are NOT DETECTED.  The SARS-CoV-2 RNA is generally detectable in upper respiratory specimens during the acute phase of infection. The lowest concentration of SARS-CoV-2 viral copies this assay can detect is 138 copies/mL. A negative result does not preclude SARS-Cov-2 infection and should not be used as the sole basis for treatment or other patient management decisions. A negative result may occur with  improper specimen collection/handling, submission of specimen other than nasopharyngeal swab, presence of viral mutation(s) within the areas targeted by this assay, and inadequate number of viral copies(<138 copies/mL). A negative result must be combined with clinical observations, patient history, and epidemiological information. The expected result is Negative.  Fact Sheet for Patients:  EntrepreneurPulse.com.au  Fact Sheet for Healthcare Providers:  IncredibleEmployment.be  This test is no t yet approved or cleared by the [place] FDA and  has been authorized for detection and/or diagnosis of SARS-CoV-2 by FDA under an Emergency Use Authorization (EUA). This EUA will remain  in effect (meaning this test can be used) for the duration of the COVID-19 declaration under Section 564(b)(1) of the Act, 21 U.S.C.section 360bbb-3(b)(1), unless the authorization is terminated  or revoked sooner.       Influenza A by PCR NEGATIVE NEGATIVE Final   Influenza B by PCR NEGATIVE NEGATIVE Final    Comment: (NOTE) The Xpert Xpress SARS-CoV-2/FLU/RSV plus assay is intended as an aid in the diagnosis of influenza from Nasopharyngeal swab specimens and should not be used as a sole basis for treatment. Nasal washings and aspirates are unacceptable for Xpert Xpress SARS-CoV-2/FLU/RSV testing.  Fact Sheet for Patients: EntrepreneurPulse.com.au  Fact Sheet for Healthcare Providers: IncredibleEmployment.be  This test is not yet approved or cleared by the [place] FDA and has been authorized for detection and/or diagnosis of SARS-CoV-2 by FDA under an Emergency Use Authorization (EUA). This EUA will remain in effect (meaning this test can be used) for the duration of the COVID-19 declaration under Section 564(b)(1) of the Act, 21 U.S.C. section 360bbb-3(b)(1), unless the authorization is terminated or revoked.  Performed at [facility], [place] [address], [place], [place] [address]          Radiology Studies: US Abdomen Limited RUQ (LIVER/GB)  Result Date: 08/27/2021 CLINICAL DATA:  Gallbladder wall thickening on CT. History of pancreatic cancer. EXAM: ULTRASOUND ABDOMEN LIMITED RIGHT UPPER QUADRANT COMPARISON:  CT of earlier today FINDINGS: Gallbladder: No gallstones. Gallbladder wall thickening at up to 7 mm. Gas within the gallbladder is likely related to common duct stent in place. Trace pericholecystic fluid. Sonographic Murphy's sign could not be evaluated  as patient had received pain medications. Common bile duct: Diameter: Dilated at 16 mm. Common duct stent in place on today's CT. Liver: Multiple liver lesions, consistent with metastatic disease as on CT today. Portal vein is patent on color Doppler imaging with normal direction of blood flow towards the liver. Other: None. IMPRESSION: No gallstones. Gallbladder wall thickening and small volume pericholecystic fluid are nonspecific. Patient had received pain meds prior to scanning, therefore tenderness to gallbladder palpation could not be evaluated. Hepatic metastasis, as on today's CT. Electronically Signed   By: Abigail Miyamoto M.D.   On: 08/23/2021 17:08   CT CHEST ABDOMEN PELVIS W CONTRAST  Result Date: 09/11/2021 CLINICAL DATA:  Pancreatic cancer; intermittent fever and black tarry stools; * Tracking Code: BO * EXAM: CT CHEST, ABDOMEN, AND PELVIS WITH CONTRAST TECHNIQUE: Multidetector CT imaging of the chest, abdomen and pelvis was performed following the standard protocol during bolus administration of intravenous contrast. RADIATION DOSE REDUCTION: This exam was performed according to the departmental dose-optimization program which includes automated exposure control, adjustment of the mA and/or kV according to patient size and/or use of iterative reconstruction technique. CONTRAST:  150m OMNIPAQUE IOHEXOL 300 MG/ML  SOLN COMPARISON:  CT abdomen and pelvis dated Aug 05, 2021 FINDINGS: CT CHEST FINDINGS Cardiovascular: Normal heart size. No pericardial effusion. Severe three-vessel coronary artery calcifications. Normal caliber thoracic aorta with mild atherosclerotic disease. Mediastinum/Nodes: Small hiatal hernia. Thyroid is unremarkable. No pathologically enlarged lymph nodes seen in the chest. Lungs/Pleura: Central airways are patent. No consolidation, pleural effusion or pneumothorax. Bibasilar atelectasis. Musculoskeletal: No chest wall mass or suspicious bone lesions identified. CT ABDOMEN PELVIS  FINDINGS Hepatobiliary: Numerous new ill-defined peripherally enhancing liver lesions are seen. Reference lesion of the left hepatic lobe measuring 15 x 13 mm on series 2, image 55. Reference new lesion of the right hepatic lobe measuring 17 x 16 mm on image 65. Decompressed gallbladder with mild wall thickening. Common bile duct stents in place with associated pneumobilia. No intrahepatic biliary ductal dilation. Pancreas: Ill-defined masslike area of the pancreatic head measuring 3.2 x 3.6 cm on image 68, new when compared to the prior exam. Spleen: Normal in size without focal abnormality. Adrenals/Urinary Tract: Adrenal glands are unremarkable. Kidneys are normal, without renal calculi, focal lesion, or hydronephrosis. Bladder is unremarkable. Stomach/Bowel: Stomach is within normal limits. Appendix appears normal. Stool ball in the rectum with no rectal wall thickening. Mild wall thickening of the duodenum. No evidence of obstruction. Vascular/Lymphatic: Aortic  atherosclerosis. No enlarged abdominal or pelvic lymph nodes. Reproductive: Mild prostatomegaly. Other: New trace ascites.  No free air. Musculoskeletal: No acute or significant osseous findings. IMPRESSION: 1. New masslike area of the pancreatic head, concerning for progressive disease. 2. Multiple new irregular peripherally enhancing liver lesions, concerning for hepatic metastatic disease. 3. New trace abdominal ascites, concerning for peritoneal carcinomatosis. 4. Mild wall thickening of the duodenum, findings can be seen in the setting of duodenitis. 5. Common bile duct stents in place with associated pneumobilia. No intrahepatic biliary ductal dilation. 6. Decompressed gallbladder with mild wall thickening, likely reactive. If there is clinical concern for acute cholecystitis, gallbladder ultrasound could be performed for further evaluation. 7. Aortic Atherosclerosis (ICD10-I70.0). Electronically Signed   By: Yetta Glassman M.D.   On: 08/26/2021  11:20   DG Chest 2 View  Result Date: 09/01/2021 CLINICAL DATA:  Fever and weakness EXAM: CHEST - 2 VIEW COMPARISON:  09/10/2021 FINDINGS: Left porta catheter with tip at the upper cavoatrial junction. Normal heart size and mediastinal contours. Coronary stenting. No acute infiltrate or edema. No effusion or pneumothorax. No acute osseous findings. Artifact from EKG leads. IMPRESSION: Negative for pneumonia. Electronically Signed   By: Jorje Guild M.D.   On: 09/10/2021 06:27        Scheduled Meds:  [MAR Hold] pantoprazole (PROTONIX) IV  40 mg Intravenous Q12H   Continuous Infusions:  sodium chloride     sodium chloride 150 mL/hr at 08/28/2021 0017   [MAR Hold] ceFEPime (MAXIPIME) IV 2 g (09/11/2021 0619)   [MAR Hold] metronidazole 500 mg (09/10/2021 0433)   [MAR Hold] vancomycin 1,750 mg (08/25/2021 1917)          Aline August, MD [facility] 08/22/2021, 12:01 PM

## 2021-09-13 NOTE — Anesthesia Procedure Notes (Signed)
Date/Time: 09/11/2021 1:36 PM  Performed by: Cynda Familia, CRNAPre-anesthesia Checklist: Patient identified, Suction available, Emergency Drugs available, Patient being monitored and Timeout performed Patient Re-evaluated:Patient Re-evaluated prior to induction Oxygen Delivery Method: Simple face mask Placement Confirmation: positive ETCO2 and breath sounds checked- equal and bilateral Dental Injury: Teeth and Oropharynx as per pre-operative assessment

## 2021-09-13 NOTE — Transfer of Care (Signed)
Immediate Anesthesia Transfer of Care Note  Patient: Casey Pierce  Procedure(s) Performed: ESOPHAGOGASTRODUODENOSCOPY (EGD)  Patient Location: PACU  Anesthesia Type:MAC  Level of Consciousness: sedated  Airway & Oxygen Therapy: Patient Spontanous Breathing and Patient connected to face mask oxygen  Post-op Assessment: Report given to RN and Post -op Vital signs reviewed and unstable, Anesthesiologist notified  Post vital signs: Reviewed  Last Vitals:  Vitals Value Taken Time  BP 78/37 08/28/2021 1402  Temp    Pulse 81 08/27/2021 1404  Resp 20 09/01/2021 1404  SpO2 100 % 09/11/2021 1404  Vitals shown include unvalidated device data.  Last Pain:  Vitals:   09/03/2021 1209  TempSrc: Temporal  PainSc: 0-No pain         Complications: No notable events documented.

## 2021-09-13 NOTE — Progress Notes (Addendum)
Casey Pierce.   DOB:1955/08/12   TK#:[id number]   XNT#:[id number]  Oncology follow up   Subjective: Patient underwent EGD today, and tolerated procedure well.  Melena has resolved, no other new complaints.  He has had no fever during the day today.   Objective:  Vitals:   08/23/2021 1425 09/03/2021 1455  BP: 101/60 112/77  Pulse: 91 87  Resp: (!) 25 20  Temp:  98.2 F (36.8 C)  SpO2: 94%     Body mass index is 26.63 kg/m.  Intake/Output Summary (Last 24 hours) at 09/02/2021 1818 Last data filed at 09/10/2021 1700 Gross per 24 hour  Intake 2622.97 ml  Output 0 ml  Net 2622.97 ml     Sclerae unicteric  Oropharynx clear  No peripheral adenopathy  Abdomen soft   MSK no focal spinal tenderness, no peripheral edema  Neuro nonfocal    CBG (last 3)  Recent Labs    09/11/21 0311  GLUCAP 141*     Labs:   Urine Studies No results for input(s): "UHGB", "CRYS" in the last 72 hours.  Invalid input(s): "UACOL", "UAPR", "USPG", "UPH", "UTP", "UGL", "UKET", "UBIL", "UNIT", "UROB", "ULEU", "UEPI", "UWBC", "URBC", "UBAC", "CAST", "UCOM", "BILUA"  Basic Metabolic Panel: Recent Labs  Lab 09/10/21 2300 08/29/2021 0540 08/21/2021 0526  NA 132* 133* 134*  K 3.7 3.8 3.9  CL 100 100 105  CO2 '25 24 23  '$ GLUCOSE 251* 191* 158*  BUN '13 12 11  '$ CREATININE 0.46* 0.44* 0.46*  CALCIUM 8.8* 9.1 9.2   GFR Estimated Creatinine Clearance: 77.9 mL/min (A) (by C-G formula based on SCr of 0.46 mg/dL (L)). Liver Function Tests: Recent Labs  Lab 09/10/21 2300 09/09/2021 1630 09/10/2021 0526  AST 65* 58* 38  ALT 113* 113* 89*  ALKPHOS 103 103 98  BILITOT 0.6 0.9 0.8  PROT 6.0* 5.9* 5.5*  ALBUMIN 2.3* 2.3* 2.1*   No results for input(s): "LIPASE", "AMYLASE" in the last 168 hours. No results for input(s): "AMMONIA" in the last 168 hours. Coagulation profile Recent Labs  Lab 09/10/21 2300 08/26/2021 0526  INR 1.5* 1.4*    CBC: Recent Labs  Lab 09/10/21 2300 08/21/2021 0540  08/23/2021 0526  WBC 12.8* 16.3* 15.1*  NEUTROABS 10.7* 14.5*  --   HGB 8.7* 8.8* 7.8*  HCT 26.4* 26.3* 23.9*  MCV 99.2 99.2 100.4*  PLT 114* 127* 112*   Cardiac Enzymes: No results for input(s): "CKTOTAL", "CKMB", "CKMBINDEX", "TROPONINI" in the last 168 hours. BNP: Invalid input(s): "POCBNP" CBG: Recent Labs  Lab 09/11/21 0311  GLUCAP 141*   D-Dimer No results for input(s): "DDIMER" in the last 72 hours. Hgb A1c No results for input(s): "HGBA1C" in the last 72 hours. Lipid Profile No results for input(s): "CHOL", "HDL", "LDLCALC", "TRIG", "CHOLHDL", "LDLDIRECT" in the last 72 hours. Thyroid function studies No results for input(s): "TSH", "T4TOTAL", "T3FREE", "THYROIDAB" in the last 72 hours.  Invalid input(s): "FREET3" Anemia work up No results for input(s): "VITAMINB12", "FOLATE", "FERRITIN", "TIBC", "IRON", "RETICCTPCT" in the last 72 hours. Microbiology Recent Results (from the past 240 hour(s))  Culture, blood (Routine x 2)     Status: None (Preliminary result)   Collection Time: 09/10/21 11:00 PM   Specimen: BLOOD  Result Value Ref Range Status   Specimen Description   Final    BLOOD LEFT ANTECUBITAL Performed at [facility] [address] [place], [place] [address]    Special Requests   Final    BOTTLES DRAWN AEROBIC AND ANAEROBIC  Blood Culture adequate volume Performed at [facility] [address], [place], [place] [address]    Culture   Final    NO GROWTH 2 DAYS Performed at [facility] [address] [place], [place] [address]    Report Status PENDING  Incomplete  Culture, blood (Routine x 2)     Status: None (Preliminary result)   Collection Time: 09/10/21 11:00 PM   Specimen: BLOOD  Result Value Ref Range Status   Specimen Description   Final    BLOOD LEFT ANTECUBITAL Performed at [facility] [address] [place], [place] [address]    Special Requests   Final    BOTTLES  DRAWN AEROBIC AND ANAEROBIC Blood Culture adequate volume Performed at [facility] [address], [place], [place] [address]    Culture   Final    NO GROWTH 2 DAYS Performed at [facility] [address], [place], [place] [address]    Report Status PENDING  Incomplete  Blood culture (routine x 2)     Status: None (Preliminary result)   Collection Time: 08/29/2021  5:40 AM   Specimen: Right Antecubital; Blood  Result Value Ref Range Status   Specimen Description   Final    RIGHT ANTECUBITAL Performed at [facility] [address] [place], [place] [address]    Special Requests   Final    BOTTLES DRAWN AEROBIC AND ANAEROBIC Blood Culture results may not be optimal due to an excessive volume of blood received in culture bottles   Culture   Final    NO GROWTH < 12 HOURS Performed at [facility] [address], [place], [place] [address]    Report Status PENDING  Incomplete  Blood culture (routine x 2)     Status: None (Preliminary result)   Collection Time: 09/07/2021  6:00 AM   Specimen: BLOOD LEFT WRIST  Result Value Ref Range Status   Specimen Description   Final    BLOOD LEFT WRIST Performed at [facility] [address] [place], [place] [address]    Special Requests   Final    BOTTLES DRAWN AEROBIC AND ANAEROBIC Blood Culture results may not be optimal due to an excessive volume of blood received in culture bottles   Culture   Final    NO GROWTH < 12 HOURS Performed at [facility] [address] [place], [place] [address]    Report Status PENDING  Incomplete  Resp Panel by RT-PCR (Flu A&B, Covid) Anterior Nasal Swab     Status: None   Collection Time: 08/29/2021  8:01 AM   Specimen: Anterior Nasal Swab  Result Value Ref Range Status   SARS Coronavirus 2 by RT PCR NEGATIVE NEGATIVE Final    Comment: (NOTE) SARS-CoV-2 target nucleic acids are NOT DETECTED.  The SARS-CoV-2 RNA is generally  detectable in upper respiratory specimens during the acute phase of infection. The lowest concentration of SARS-CoV-2 viral copies this assay can detect is 138 copies/mL. A negative result does not preclude SARS-Cov-2 infection and should not be used as the sole basis for treatment or other patient management decisions. A negative result may occur with  improper specimen collection/handling, submission of specimen other than nasopharyngeal swab, presence of viral mutation(s) within the areas targeted by this assay, and inadequate number of viral copies(<138 copies/mL). A negative result must be combined with clinical observations, patient history, and epidemiological information.  The expected result is Negative.  Fact Sheet for Patients:  EntrepreneurPulse.com.au  Fact Sheet for Healthcare Providers:  IncredibleEmployment.be  This test is no t yet approved or cleared by the [place] FDA and  has been authorized for detection and/or diagnosis of SARS-CoV-2 by FDA under an Emergency Use Authorization (EUA). This EUA will remain  in effect (meaning this test can be used) for the duration of the COVID-19 declaration under Section 564(b)(1) of the Act, 21 U.S.C.section 360bbb-3(b)(1), unless the authorization is terminated  or revoked sooner.       Influenza A by PCR NEGATIVE NEGATIVE Final   Influenza B by PCR NEGATIVE NEGATIVE Final    Comment: (NOTE) The Xpert Xpress SARS-CoV-2/FLU/RSV plus assay is intended as an aid in the diagnosis of influenza from Nasopharyngeal swab specimens and should not be used as a sole basis for treatment. Nasal washings and aspirates are unacceptable for Xpert Xpress SARS-CoV-2/FLU/RSV testing.  Fact Sheet for Patients: EntrepreneurPulse.com.au  Fact Sheet for Healthcare Providers: IncredibleEmployment.be  This test is not yet approved or cleared by the [place] FDA  and has been authorized for detection and/or diagnosis of SARS-CoV-2 by FDA under an Emergency Use Authorization (EUA). This EUA will remain in effect (meaning this test can be used) for the duration of the COVID-19 declaration under Section 564(b)(1) of the Act, 21 U.S.C. section 360bbb-3(b)(1), unless the authorization is terminated or revoked.  Performed at [facility], [place] [address] [place], [place] [address]       Studies:  US Abdomen Limited RUQ (LIVER/GB)  Result Date: 08/22/2021 CLINICAL DATA:  Gallbladder wall thickening on CT. History of pancreatic cancer. EXAM: ULTRASOUND ABDOMEN LIMITED RIGHT UPPER QUADRANT COMPARISON:  CT of earlier today FINDINGS: Gallbladder: No gallstones. Gallbladder wall thickening at up to 7 mm. Gas within the gallbladder is likely related to common duct stent in place. Trace pericholecystic fluid. Sonographic Murphy's sign could not be evaluated as patient had received pain medications. Common bile duct: Diameter: Dilated at 16 mm. Common duct stent in place on today's CT. Liver: Multiple liver lesions, consistent with metastatic disease as on CT today. Portal vein is patent on color Doppler imaging with normal direction of blood flow towards the liver. Other: None. IMPRESSION: No gallstones. Gallbladder wall thickening and small volume pericholecystic fluid are nonspecific. Patient had received pain meds prior to scanning, therefore tenderness to gallbladder palpation could not be evaluated. Hepatic metastasis, as on today's CT. Electronically Signed   By: Abigail Miyamoto M.D.   On: 09/14/2021 17:08   CT CHEST ABDOMEN PELVIS W CONTRAST  Result Date: 08/26/2021 CLINICAL DATA:  Pancreatic cancer; intermittent fever and black tarry stools; * Tracking Code: BO * EXAM: CT CHEST, ABDOMEN, AND PELVIS WITH CONTRAST TECHNIQUE: Multidetector CT imaging of the chest, abdomen and pelvis was performed following the standard protocol during bolus  administration of intravenous contrast. RADIATION DOSE REDUCTION: This exam was performed according to the departmental dose-optimization program which includes automated exposure control, adjustment of the mA and/or kV according to patient size and/or use of iterative reconstruction technique. CONTRAST:  170m OMNIPAQUE IOHEXOL 300 MG/ML  SOLN COMPARISON:  CT abdomen and pelvis dated Aug 05, 2021 FINDINGS: CT CHEST FINDINGS Cardiovascular: Normal heart size. No pericardial effusion. Severe three-vessel coronary artery calcifications. Normal caliber thoracic aorta with mild atherosclerotic disease. Mediastinum/Nodes: Small hiatal hernia. Thyroid is unremarkable. No pathologically enlarged lymph nodes seen in the chest. Lungs/Pleura: Central airways are patent. No consolidation, pleural effusion or pneumothorax.  Bibasilar atelectasis. Musculoskeletal: No chest wall mass or suspicious bone lesions identified. CT ABDOMEN PELVIS FINDINGS Hepatobiliary: Numerous new ill-defined peripherally enhancing liver lesions are seen. Reference lesion of the left hepatic lobe measuring 15 x 13 mm on series 2, image 55. Reference new lesion of the right hepatic lobe measuring 17 x 16 mm on image 65. Decompressed gallbladder with mild wall thickening. Common bile duct stents in place with associated pneumobilia. No intrahepatic biliary ductal dilation. Pancreas: Ill-defined masslike area of the pancreatic head measuring 3.2 x 3.6 cm on image 68, new when compared to the prior exam. Spleen: Normal in size without focal abnormality. Adrenals/Urinary Tract: Adrenal glands are unremarkable. Kidneys are normal, without renal calculi, focal lesion, or hydronephrosis. Bladder is unremarkable. Stomach/Bowel: Stomach is within normal limits. Appendix appears normal. Stool ball in the rectum with no rectal wall thickening. Mild wall thickening of the duodenum. No evidence of obstruction. Vascular/Lymphatic: Aortic atherosclerosis. No enlarged  abdominal or pelvic lymph nodes. Reproductive: Mild prostatomegaly. Other: New trace ascites.  No free air. Musculoskeletal: No acute or significant osseous findings. IMPRESSION: 1. New masslike area of the pancreatic head, concerning for progressive disease. 2. Multiple new irregular peripherally enhancing liver lesions, concerning for hepatic metastatic disease. 3. New trace abdominal ascites, concerning for peritoneal carcinomatosis. 4. Mild wall thickening of the duodenum, findings can be seen in the setting of duodenitis. 5. Common bile duct stents in place with associated pneumobilia. No intrahepatic biliary ductal dilation. 6. Decompressed gallbladder with mild wall thickening, likely reactive. If there is clinical concern for acute cholecystitis, gallbladder ultrasound could be performed for further evaluation. 7. Aortic Atherosclerosis (ICD10-I70.0). Electronically Signed   By: Yetta Glassman M.D.   On: 08/23/2021 11:20   DG Chest 2 View  Result Date: 08/18/2021 CLINICAL DATA:  Fever and weakness EXAM: CHEST - 2 VIEW COMPARISON:  09/10/2021 FINDINGS: Left porta catheter with tip at the upper cavoatrial junction. Normal heart size and mediastinal contours. Coronary stenting. No acute infiltrate or edema. No effusion or pneumothorax. No acute osseous findings. Artifact from EKG leads. IMPRESSION: Negative for pneumonia. Electronically Signed   By: Jorje Guild M.D.   On: 08/28/2021 06:27    Assessment: 66 y.o. male   Recurrent fever, infection vs tumor fever  Stage IIb pancreatic adenocarcinoma, post neoadjuvant chemotherapy, now with new liver lesions highly concerning for metastasis Fatigue, anorexia and weight loss, likely secondary to his cancer progression Worsening anemia, from duodenitis and related GI bleeding and underline cancer  CAD and AF  History of CVA Significant duodenitis, possible related to pancreatic cancer   Plan:  -I have discussed with Dr. Alessandra Bevels, patient has  significant duodenitis on the EGD, unclear etiology, no visible tumor invasion to duodenum on endoscopy, but we suspect is due to the cancer in pancreatic head. -Source of his fever is still unclear, blood cultures negative so far, respiratory panel negative.  He is on broad antibiotics, will continue follow-up cultures.  There is no high clinical suspicion for acute cholangitis.  The biliary stent is patent on CT  -I also spoke with IR Dr. Earleen Newport today, he reviewed his recent CT scan, agree his liver lesions are likely metastatic cancer, but liver abscess is not completely ruled out in the setting of fever.  He recommends abdominal MRI for further evaluation, and he agrees to do ultrasound-guided liver biopsy if no clear evidence of abscess on MRI. I will order MRI and IR consult  -Agree to hold anticoagulation due to his GI  bleeding.  -I recommend blood transfusion if Hg<7.5  -will f/u tomorrow or Friday   Truitt Merle, MD 09/06/2021

## 2021-09-13 NOTE — Brief Op Note (Signed)
08/17/2021 - 08/26/2021  1:58 PM  PATIENT:  Casey Pierce.  66 y.o. male  PRE-OPERATIVE DIAGNOSIS:  Melena, anemia, nausea  POST-OPERATIVE DIAGNOSIS:  Severe inflammation in the duodenum  PROCEDURE:  Procedure(s): ESOPHAGOGASTRODUODENOSCOPY (EGD) (N/A)  SURGEON:  Surgeon(s) and Role:    * Vanity Larsson, MD - Primary  Findings ------------ -EGD showed severe inflammation with contact mucosal oozing /hemorrhagic inflammation in the duodenum including duodenal bulb, first and second portion of the duodenum.  Most likely source of patient's melena.  To previously placed stent visualized in the second portion of the duodenum.  GI also showed mild gastritis and esophagitis.  Recommendations ------------------------ -Most likely source for patient's melena is diffuse mucosal bleeding from duodenum. -Commend to hold Eliquis for at least 1 week. -Recommend Protonix 40 mg twice a day indefinitely -Okay to have soft diet today. -We will follow  Otis Brace MD, [place] 09/11/2021, 2:00 PM  Contact #  [phone]

## 2021-09-13 NOTE — Interval H&P Note (Signed)
History and Physical Interval Note:  09/09/2021 12:20 PM  Forde Radon.  has presented today for surgery, with the diagnosis of Melena, anemia, nausea.  The various methods of treatment have been discussed with the patient and family. After consideration of risks, benefits and other options for treatment, the patient has consented to  Procedure(s): ESOPHAGOGASTRODUODENOSCOPY (EGD) (N/A) as a surgical intervention.  The patient's history has been reviewed, patient examined, no change in status, stable for surgery.  I have reviewed the patient's chart and labs.  Questions were answered to the patient's satisfaction.     Ludivina Guymon

## 2021-09-13 NOTE — Op Note (Signed)
[facility] Patient Name: Casey Pierce Procedure Date: 08/18/2021 MRN: [id number] Attending MD: Otis Brace , MD Date of Birth: 03/11/1956 CSN: [id number] Age: 66 Admit Type: Inpatient Procedure:                Upper GI endoscopy Indications:              Melena Providers:                Otis Brace, MD, Allayne Gitelman, RN, William Dalton, Technician, Darliss Cheney, Technician Referring MD:              Medicines:                Sedation Administered by an Anesthesia Professional Complications:            No immediate complications. Estimated Blood Loss:     Estimated blood loss was minimal. Procedure:                Pre-Anesthesia Assessment:                           - Prior to the procedure, a History and Physical                            was performed, and patient medications and                            allergies were reviewed. The patient's tolerance of                            previous anesthesia was also reviewed. The risks                            and benefits of the procedure and the sedation                            options and risks were discussed with the patient.                            All questions were answered, and informed consent                            was obtained. Prior Anticoagulants: The patient has                            taken Eliquis (apixaban), last dose was 2 days                            prior to procedure. ASA Grade Assessment: III - A                            patient with severe systemic disease. After  reviewing the risks and benefits, the patient was                            deemed in satisfactory condition to undergo the                            procedure.                           After obtaining informed consent, the endoscope was                            passed under direct vision. Throughout the                            procedure, the  patient's blood pressure, pulse, and                            oxygen saturations were monitored continuously. The                            [id number]-[id number] ([id number]) Olympus endoscope was introduced                            through the mouth, and advanced to the second part                            of duodenum. The upper GI endoscopy was                            accomplished without difficulty. The patient                            tolerated the procedure well. Scope In: Scope Out: Findings:      LA Grade A (one or more mucosal breaks less than 5 mm, not extending       between tops of 2 mucosal folds) esophagitis with no bleeding was found       in the distal esophagus.      Scattered mild inflammation characterized by erythema was found in the       entire examined stomach.      The cardia and gastric fundus were normal on retroflexion.      Diffuse severe inflammation with mucosal hemorrhage characterized by       congestion (edema), erosions, erythema and friability was found in the       duodenal bulb, in the first portion of the duodenum and in the second       portion of the duodenum.      Two previously placed metal and plastic stents were seen in the second       portion of the duodenum. Impression:               - LA Grade A reflux esophagitis with no bleeding.                           - Gastritis.                           -  Duodenitis with hemorrhage.                           - Metal plastic stents in the duodenum.                           - No specimens collected. Moderate Sedation:      Moderate (conscious) sedation was personally administered by an       anesthesia professional. The following parameters were monitored: oxygen       saturation, heart rate, blood pressure, and response to care. Recommendation:           - Return patient to hospital ward for ongoing care.                           - Soft diet.                           - Continue present  medications. Procedure Code(s):        --- Professional ---                           [id number], Esophagogastroduodenoscopy, flexible,                            transoral; diagnostic, including collection of                            specimen(s) by brushing or washing, when performed                            (separate procedure) Diagnosis Code(s):        --- Professional ---                           K21.00, Gastro-esophageal reflux disease with                            esophagitis, without bleeding                           K29.70, Gastritis, unspecified, without bleeding                           K29.81, Duodenitis with bleeding                           K92.1, Melena (includes Hematochezia) CPT copyright 2019 [employer]. All rights reserved. The codes documented in this report are preliminary and upon coder review may  be revised to meet current compliance requirements. Otis Brace, MD Otis Brace, MD 08/25/2021 1:55:54 PM Number of Addenda: 0

## 2021-09-13 NOTE — TOC Initial Note (Signed)
Transition of Care (TOC) - Initial/Assessment Note    Patient Details  Name: Casey Pierce. MRN: [id number] Date of Birth: 1955/10/02  Transition of Care [facility]) CM/SW Contact:    Leeroy Cha, RN Phone Number: 08/30/2021, 7:51 AM  Clinical Narrative:                  Transition of Care [facility]) Screening Note   Patient Details  Name: Casey Pierce. Date of Birth: 1955-07-29   Transition of Care [facility]) CM/SW Contact:    Leeroy Cha, RN Phone Number: 08/30/2021, 7:51 AM    Transition of Care Department [facility]) has reviewed patient and no TOC needs have been identified at this time. We will continue to monitor patient advancement through interdisciplinary progression rounds. If new patient transition needs arise, please place a TOC consult.    Expected Discharge Plan: Home/Self Care Barriers to Discharge: Continued Medical Work up   Patient Goals and CMS Choice Patient states their goals for this hospitalization and ongoing recovery are:: to go home so I can have surg on [id number] CMS Medicare.gov Compare Post Acute Care list provided to:: Patient    Expected Discharge Plan and Services Expected Discharge Plan: Home/Self Care   Discharge Planning Services: CM Consult   Living arrangements for the past 2 months: Single Family Home                                      Prior Living Arrangements/Services Living arrangements for the past 2 months: Single Family Home Lives with:: Spouse Patient language and need for interpreter reviewed:: Yes Do you feel safe going back to the place where you live?: Yes            Criminal Activity/Legal Involvement Pertinent to Current Situation/Hospitalization: No - Comment as needed  Activities of Daily Living Home Assistive Devices/Equipment: Eyeglasses ADL Screening (condition at time of admission) Patient's cognitive ability adequate to safely complete daily activities?: Yes Is the patient deaf or have  difficulty hearing?: No Does the patient have difficulty seeing, even when wearing glasses/contacts?: No Does the patient have difficulty concentrating, remembering, or making decisions?: No Patient able to express need for assistance with ADLs?: Yes Does the patient have difficulty dressing or bathing?: No Independently performs ADLs?: Yes (appropriate for developmental age) Does the patient have difficulty walking or climbing stairs?: No Weakness of Legs: Both Weakness of Arms/Hands: Both  Permission Sought/Granted                  Emotional Assessment Appearance:: Appears stated age     Orientation: : Oriented to Place, Oriented to Self, Oriented to  Time, Oriented to Situation Alcohol / Substance Use: Not Applicable Psych Involvement: No (comment)  Admission diagnosis:  Sepsis ([place]) [A41.9] Malignant neoplasm of pancreas, unspecified location of malignancy ([place]) [C25.9] Fever, unspecified fever cause [R50.9] Patient Active Problem List   Diagnosis Date Noted   Encounter for care related to vascular access port 09/08/2021   Dehydration 08/28/2021   Bacteremia due to Gram-positive bacteria 08/16/2021   Biliary obstruction    Sepsis ([place]) 08/05/2021   Colitis 08/05/2021   Genetic testing 05/16/2021   Pancreatic cancer ([place]) 04/05/2021   Permanent atrial fibrillation ([place]) 11/29/2020   RBBB 11/28/2019   Persistent atrial fibrillation ([place]) 05/05/2014   History of stroke 05/04/2014   TIA (transient ischemic attack) 05/04/2014   CVA (cerebral  infarction)    Hyperlipidemia    Coronary artery disease involving native coronary artery of native heart with angina pectoris ([place])    PCP:  Lavone Orn, MD Pharmacy:   [id number] [place], [place] [place]Phone: [phone]: [phone] [facility] - [address] [place]Phone:  [phone]: [phone]    Social Determinants of Health (SDOH) Interventions    Readmission Risk Interventions     No data to display

## 2021-09-13 NOTE — Anesthesia Postprocedure Evaluation (Signed)
Anesthesia Post Note  Patient: Casey Pierce.  Procedure(s) Performed: ESOPHAGOGASTRODUODENOSCOPY (EGD)     Patient location during evaluation: Endoscopy Anesthesia Type: MAC Level of consciousness: awake Pain management: pain level controlled Vital Signs Assessment: post-procedure vital signs reviewed and stable Respiratory status: spontaneous breathing Cardiovascular status: stable Postop Assessment: no apparent nausea or vomiting Anesthetic complications: no   No notable events documented.  Last Vitals:  Vitals:   09/14/2021 1405 08/24/2021 1410  BP: 109/64 (!) 96/43  Pulse: 83 97  Resp: (!) 21 20  Temp:    SpO2: 100% 95%    Last Pain:  Vitals:   08/26/2021 1410  TempSrc:   PainSc: 0-No pain                 Huston Foley

## 2021-09-14 ENCOUNTER — Encounter (HOSPITAL_COMMUNITY): Payer: Self-pay | Admitting: Gastroenterology

## 2021-09-14 ENCOUNTER — Inpatient Hospital Stay (HOSPITAL_COMMUNITY): Payer: Medicare PPO

## 2021-09-14 DIAGNOSIS — E871 Hypo-osmolality and hyponatremia: Secondary | ICD-10-CM

## 2021-09-14 DIAGNOSIS — D696 Thrombocytopenia, unspecified: Secondary | ICD-10-CM

## 2021-09-14 DIAGNOSIS — R509 Fever, unspecified: Secondary | ICD-10-CM | POA: Diagnosis not present

## 2021-09-14 DIAGNOSIS — D638 Anemia in other chronic diseases classified elsewhere: Secondary | ICD-10-CM | POA: Diagnosis not present

## 2021-09-14 DIAGNOSIS — C250 Malignant neoplasm of head of pancreas: Secondary | ICD-10-CM | POA: Diagnosis not present

## 2021-09-14 DIAGNOSIS — K2980 Duodenitis without bleeding: Secondary | ICD-10-CM

## 2021-09-14 DIAGNOSIS — A419 Sepsis, unspecified organism: Secondary | ICD-10-CM | POA: Diagnosis not present

## 2021-09-14 LAB — CBC WITH DIFFERENTIAL/PLATELET
Abs Immature Granulocytes: 0.1 10*3/uL — ABNORMAL HIGH (ref 0.00–0.07)
Basophils Absolute: 0 10*3/uL (ref 0.0–0.1)
Basophils Relative: 0 %
Eosinophils Absolute: 0 10*3/uL (ref 0.0–0.5)
Eosinophils Relative: 0 %
HCT: 22.9 % — ABNORMAL LOW (ref 39.0–52.0)
Hemoglobin: 7.3 g/dL — ABNORMAL LOW (ref 13.0–17.0)
Immature Granulocytes: 1 %
Lymphocytes Relative: 5 %
Lymphs Abs: 0.8 10*3/uL (ref 0.7–4.0)
MCH: 32 pg (ref 26.0–34.0)
MCHC: 31.9 g/dL (ref 30.0–36.0)
MCV: 100.4 fL — ABNORMAL HIGH (ref 80.0–100.0)
Monocytes Absolute: 0.9 10*3/uL (ref 0.1–1.0)
Monocytes Relative: 6 %
Neutro Abs: 12.3 10*3/uL — ABNORMAL HIGH (ref 1.7–7.7)
Neutrophils Relative %: 88 %
Platelets: 104 10*3/uL — ABNORMAL LOW (ref 150–400)
RBC: 2.28 MIL/uL — ABNORMAL LOW (ref 4.22–5.81)
RDW: 15.7 % — ABNORMAL HIGH (ref 11.5–15.5)
WBC: 14.1 10*3/uL — ABNORMAL HIGH (ref 4.0–10.5)
nRBC: 0 % (ref 0.0–0.2)

## 2021-09-14 LAB — COMPREHENSIVE METABOLIC PANEL
ALT: 62 U/L — ABNORMAL HIGH (ref 0–44)
AST: 27 U/L (ref 15–41)
Albumin: 1.9 g/dL — ABNORMAL LOW (ref 3.5–5.0)
Alkaline Phosphatase: 84 U/L (ref 38–126)
Anion gap: 6 (ref 5–15)
BUN: 12 mg/dL (ref 8–23)
CO2: 22 mmol/L (ref 22–32)
Calcium: 9.1 mg/dL (ref 8.9–10.3)
Chloride: 105 mmol/L (ref 98–111)
Creatinine, Ser: 0.48 mg/dL — ABNORMAL LOW (ref 0.61–1.24)
GFR, Estimated: >60 mL/min (ref >60)
Glucose, Bld: 133 mg/dL — ABNORMAL HIGH (ref 70–99)
Potassium: 3.6 mmol/L (ref 3.5–5.1)
Sodium: 133 mmol/L — ABNORMAL LOW (ref 135–145)
Total Bilirubin: 0.7 mg/dL (ref 0.3–1.2)
Total Protein: 5 g/dL — ABNORMAL LOW (ref 6.5–8.1)

## 2021-09-14 LAB — HEMOGLOBIN AND HEMATOCRIT, BLOOD
HCT: 27.9 % — ABNORMAL LOW (ref 39.0–52.0)
Hemoglobin: 9.4 g/dL — ABNORMAL LOW (ref 13.0–17.0)

## 2021-09-14 LAB — MAGNESIUM: Magnesium: 1.6 mg/dL — ABNORMAL LOW (ref 1.7–2.4)

## 2021-09-14 LAB — PREPARE RBC (CROSSMATCH)

## 2021-09-14 MED ORDER — ADULT MULTIVITAMIN W/MINERALS CH
1.0000 | ORAL_TABLET | Freq: Every day | ORAL | Status: DC
  Administered 2021-09-14 – 2021-09-17 (×4): 1 via ORAL
  Filled 2021-09-14 (×4): qty 1

## 2021-09-14 MED ORDER — SACCHAROMYCES BOULARDII 250 MG PO CAPS
250.0000 mg | ORAL_CAPSULE | Freq: Two times a day (BID) | ORAL | Status: DC
  Administered 2021-09-14 – 2021-09-17 (×6): 250 mg via ORAL
  Filled 2021-09-14 (×8): qty 1

## 2021-09-14 MED ORDER — MAGNESIUM SULFATE 2 GM/50ML IV SOLN
2.0000 g | Freq: Once | INTRAVENOUS | Status: AC
  Administered 2021-09-14: 2 g via INTRAVENOUS
  Filled 2021-09-14: qty 50

## 2021-09-14 MED ORDER — GADOBUTROL 1 MMOL/ML IV SOLN
7.0000 mL | Freq: Once | INTRAVENOUS | Status: AC | PRN
  Administered 2021-09-14: 7 mL via INTRAVENOUS

## 2021-09-14 MED ORDER — SODIUM CHLORIDE 0.9% IV SOLUTION: Freq: Once | INTRAVENOUS | Status: AC

## 2021-09-14 NOTE — Discharge Instructions (Signed)

## 2021-09-14 NOTE — Progress Notes (Addendum)
PROGRESS NOTE    Casey Pierce.  DVV:[id number] DOB: Apr 01, 1955 DOA: 09/02/2021 PCP: Lavone Orn, MD   Brief Narrative:  66 y.o. male with medical history significant of CAD s/p stents, CVA, permanent A-fib, pancreatic CA recently completed chemotherapy  presented with fever, abdominal pain, fatigue, poor appetite and nausea with melena.  On presentation, he had a temperature of 100.6, was tachycardic.  WBC of 16.3, lactic acid 2.9, influenza and COVID-19 test negative.  CT abdomen/chest/pelvis showed new masslike area of the pancreatic head concerning for progressive disease, metastatic disease, new concerns for peritoneal carcinomatosis, duodenitis.  GI was consulted.  Assessment & Plan:   Sepsis: Present on admission, unknown source -Presented with fever, leukocytosis, tachycardia, lactic acidosis -No source of sepsis identified yet.  Unclear if this is secondary to cancer as well.  Procalcitonin 0.16 on presentation.  UA unremarkable. CT abdomen/chest/pelvis showed new masslike area of the pancreatic head concerning for progressive disease, metastatic disease, new concerns for peritoneal carcinomatosis, duodenitis with some gallbladder thickening, possibly reactive -currently on broad-spectrum antibiotics.  No evidence of MRSA infection.  DC vancomycin. -Cultures negative so far.  COVID-19 and influenza negative on presentation. -No temperature spikes over the last 24 hours.  Possible upper GI bleeding Possible duodenitis -Reported melanotic stools.  FOBT negative.  Hemoglobin 7.3 today.  Will transfuse 1 unit packed red cells today as per oncology recommendations.  Monitor H&H. -Eliquis on hold. -Continue IV Protonix twice daily. -Status post EGD on 09/09/2021 which showed severe inflammation with contact mucosal oozing/hemorrhagic inflammation in the duodenum including duodenal bulb, first and second portion of the duodenum along with mild gastritis and esophagitis.  GI recommends to  hold Eliquis for at least 1 week and to continue Protonix 40 mg twice a day indefinitely.  Lactic acidosis -From infection with dehydration.  Improved.  Leukocytosis -Monitor.  Hyponatremia -Mild. continue IV fluids. -monitor  Anemia of chronic disease -Hemoglobin currently stable.  Transfuse if hemoglobin is less than 7  Permanent A-fib -Currently rate controlled.  Eliquis on hold.  Not on any rate controlling medication.  Pancreatic cancer with possible metastases Mildly elevated LFTs -CT with possible new progression. -Oncology following.  Oncology has ordered MRI of abdomen.  Might need liver biopsy at some point. -Whipple's surgery planned for next week has been canceled  Thrombocytopenia -Possibly from cancer.  No signs of bleeding.  Monitor.    DVT prophylaxis: SCDs Code Status: Full Family Communication: Wife and daughter at bedside Disposition Plan: Status is: Inpatient Remains inpatient appropriate because: Of severity of illness    Consultants: GI/oncology  Procedures: None  Antimicrobials: None   Subjective: Patient seen and examined at bedside.  Denies any fever, worsening abdominal pain worsening shortness breath or vomiting.  Objective: Vitals:   09/08/2021 1421 08/26/2021 1425 08/21/2021 1455 09/14/21 0421  BP: 97/69 101/60 112/77 107/61  Pulse: 98 91 87 98  Resp: (!) 24 (!) '25 20 18  '$ Temp:   98.2 F (36.8 C) 98.9 F (37.2 C)  TempSrc:   Oral Oral  SpO2: 95% 94%  96%  Weight:      Height:        Intake/Output Summary (Last 24 hours) at 09/14/2021 0803 Last data filed at 08/20/2021 1700 Gross per 24 hour  Intake 640 ml  Output 0 ml  Net 640 ml    Filed Weights   08/30/2021 0536 09/03/2021 1209  Weight: 72.6 kg 72.6 kg    Examination:  General: On room air.  No  distress.  Looks chronically ill and deconditioned. ENT/neck: No thyromegaly.  JVD is not elevated  respiratory: Decreased breath sounds at bases bilaterally with some crackles;  no wheezing  CVS: S1-S2 heard, rate controlled currently Abdominal: Soft, nontender, slightly distended; no organomegaly, bowel sounds are heard Extremities: Trace lower extremity edema; no cyanosis  CNS: Awake and alert.  No focal neurologic deficit.  Moves extremities Lymph: No obvious lymphadenopathy Skin: No obvious ecchymosis/lesions  psych: Currently not agitated.  Affect is flat. musculoskeletal: No obvious joint swelling/deformity     Data Reviewed: I have personally reviewed following labs and imaging studies  CBC: Recent Labs  Lab 09/10/21 2300 09/04/2021 0540 09/03/2021 0526 09/14/21 0457  WBC 12.8* 16.3* 15.1* 14.1*  NEUTROABS 10.7* 14.5*  --  12.3*  HGB 8.7* 8.8* 7.8* 7.3*  HCT 26.4* 26.3* 23.9* 22.9*  MCV 99.2 99.2 100.4* 100.4*  PLT 114* 127* 112* 104*    Basic Metabolic Panel: Recent Labs  Lab 09/10/21 2300 09/02/2021 0540 08/26/2021 0526 09/14/21 0457  NA 132* 133* 134* 133*  K 3.7 3.8 3.9 3.6  CL 100 100 105 105  CO2 '25 24 23 22  '$ GLUCOSE 251* 191* 158* 133*  BUN '13 12 11 12  '$ CREATININE 0.46* 0.44* 0.46* 0.48*  CALCIUM 8.8* 9.1 9.2 9.1  MG  --   --   --  1.6*    GFR: Estimated Creatinine Clearance: 77.9 mL/min (A) (by C-G formula based on SCr of 0.48 mg/dL (L)). Liver Function Tests: Recent Labs  Lab 09/10/21 2300 09/04/2021 1630 08/30/2021 0526 09/14/21 0457  AST 65* 58* 38 27  ALT 113* 113* 89* 62*  ALKPHOS 103 103 98 84  BILITOT 0.6 0.9 0.8 0.7  PROT 6.0* 5.9* 5.5* 5.0*  ALBUMIN 2.3* 2.3* 2.1* 1.9*    No results for input(s): "LIPASE", "AMYLASE" in the last 168 hours. No results for input(s): "AMMONIA" in the last 168 hours. Coagulation Profile: Recent Labs  Lab 09/10/21 2300 08/18/2021 0526  INR 1.5* 1.4*    Cardiac Enzymes: No results for input(s): "CKTOTAL", "CKMB", "CKMBINDEX", "TROPONINI" in the last 168 hours. BNP (last 3 results) No results for input(s): "PROBNP" in the last 8760 hours. HbA1C: No results for input(s):  "HGBA1C" in the last 72 hours. CBG: Recent Labs  Lab 09/11/21 0311  GLUCAP 141*    Lipid Profile: No results for input(s): "CHOL", "HDL", "LDLCALC", "TRIG", "CHOLHDL", "LDLDIRECT" in the last 72 hours. Thyroid Function Tests: No results for input(s): "TSH", "T4TOTAL", "FREET4", "T3FREE", "THYROIDAB" in the last 72 hours. Anemia Panel: No results for input(s): "VITAMINB12", "FOLATE", "FERRITIN", "TIBC", "IRON", "RETICCTPCT" in the last 72 hours. Sepsis Labs: Recent Labs  Lab 09/11/21 0131 08/31/2021 0600 08/20/2021 1630 08/18/2021 2104  PROCALCITON  --   --  0.16  --   LATICACIDVEN 1.4 2.9* 2.3* 1.3     Recent Results (from the past 240 hour(s))  Culture, blood (Routine x 2)     Status: None (Preliminary result)   Collection Time: 09/10/21 11:00 PM   Specimen: BLOOD  Result Value Ref Range Status   Specimen Description   Final    BLOOD LEFT ANTECUBITAL Performed at [facility], [place] [address], [place], [place] [address]    Special Requests   Final    BOTTLES DRAWN AEROBIC AND ANAEROBIC Blood Culture adequate volume Performed at [place] [address] [place], [place] [address]    Culture   Final    NO GROWTH 3 DAYS Performed at  [facility], [place] [address], [place], [place] [address]    Report Status PENDING  Incomplete  Culture, blood (Routine x 2)     Status: None (Preliminary result)   Collection Time: 09/10/21 11:00 PM   Specimen: BLOOD  Result Value Ref Range Status   Specimen Description   Final    BLOOD LEFT ANTECUBITAL Performed at [place] [address], [place], [place] [address]    Special Requests   Final    BOTTLES DRAWN AEROBIC AND ANAEROBIC Blood Culture adequate volume Performed at [place] [address] [place], [place] [address]    Culture   Final    NO GROWTH 3 DAYS Performed at [facility], [place] [address], [place], [place] [address]     Report Status PENDING  Incomplete  Blood culture (routine x 2)     Status: None (Preliminary result)   Collection Time: 08/18/2021  5:40 AM   Specimen: BLOOD  Result Value Ref Range Status   Specimen Description BLOOD RIGHT ANTECUBITAL  Final   Special Requests   Final    BOTTLES DRAWN AEROBIC AND ANAEROBIC Blood Culture results may not be optimal due to an excessive volume of blood received in culture bottles   Culture   Final    NO GROWTH 2 DAYS Performed at [facility], [place] [address] [place], [place] [address]    Report Status PENDING  Incomplete  Blood culture (routine x 2)     Status: None (Preliminary result)   Collection Time: 08/17/2021  6:00 AM   Specimen: BLOOD LEFT WRIST  Result Value Ref Range Status   Specimen Description   Final    BLOOD LEFT WRIST Performed at [place] [facility] [address] [place], [place] [address]    Special Requests   Final    BOTTLES DRAWN AEROBIC AND ANAEROBIC Blood Culture results may not be optimal due to an excessive volume of blood received in culture bottles   Culture   Final    NO GROWTH 2 DAYS Performed at [facility], [place] [address], [place],  [address]    Report Status PENDING  Incomplete  Resp Panel by RT-PCR (Flu A&B, Covid) Anterior Nasal Swab     Status: None   Collection Time: 08/22/2021  8:01 AM   Specimen: Anterior Nasal Swab  Result Value Ref Range Status   SARS Coronavirus 2 by RT PCR NEGATIVE NEGATIVE Final    Comment: (NOTE) SARS-CoV-2 target nucleic acids are NOT DETECTED.  The SARS-CoV-2 RNA is generally detectable in upper respiratory specimens during the acute phase of infection. The lowest concentration of SARS-CoV-2 viral copies this assay can detect is 138 copies/mL. A negative result does not preclude SARS-Cov-2 infection and should not be used as the sole basis for treatment or other patient management decisions. A negative result may occur with  improper specimen  collection/handling, submission of specimen other than nasopharyngeal swab, presence of viral mutation(s) within the areas targeted by this assay, and inadequate number of viral copies(<138 copies/mL). A negative result must be combined with clinical observations, patient history, and epidemiological information. The expected result is Negative.  Fact Sheet for Patients:  EntrepreneurPulse.com.au  Fact Sheet for Healthcare Providers:  IncredibleEmployment.be  This test is no t yet approved or cleared by the [place] FDA and  has been authorized for detection and/or diagnosis of SARS-CoV-2 by FDA under an Emergency Use Authorization (EUA).  This EUA will remain  in effect (meaning this test can be used) for the duration of the COVID-19 declaration under Section 564(b)(1) of the Act, 21 U.S.C.section 360bbb-3(b)(1), unless the authorization is terminated  or revoked sooner.       Influenza A by PCR NEGATIVE NEGATIVE Final   Influenza B by PCR NEGATIVE NEGATIVE Final    Comment: (NOTE) The Xpert Xpress SARS-CoV-2/FLU/RSV plus assay is intended as an aid in the diagnosis of influenza from Nasopharyngeal swab specimens and should not be used as a sole basis for treatment. Nasal washings and aspirates are unacceptable for Xpert Xpress SARS-CoV-2/FLU/RSV testing.  Fact Sheet for Patients: EntrepreneurPulse.com.au  Fact Sheet for Healthcare Providers: IncredibleEmployment.be  This test is not yet approved or cleared by the [place] FDA and has been authorized for detection and/or diagnosis of SARS-CoV-2 by FDA under an Emergency Use Authorization (EUA). This EUA will remain in effect (meaning this test can be used) for the duration of the COVID-19 declaration under Section 564(b)(1) of the Act, 21 U.S.C. section 360bbb-3(b)(1), unless the authorization is terminated or revoked.  Performed at [facility], [place] [address], [place], [place] [address]          Radiology Studies: US Abdomen Limited RUQ (LIVER/GB)  Result Date: 09/04/2021 CLINICAL DATA:  Gallbladder wall thickening on CT. History of pancreatic cancer. EXAM: ULTRASOUND ABDOMEN LIMITED RIGHT UPPER QUADRANT COMPARISON:  CT of earlier today FINDINGS: Gallbladder: No gallstones. Gallbladder wall thickening at up to 7 mm. Gas within the gallbladder is likely related to common duct stent in place. Trace pericholecystic fluid. Sonographic Murphy's sign could not be evaluated as patient had received pain medications. Common bile duct: Diameter: Dilated at 16 mm. Common duct stent in place on today's CT. Liver: Multiple liver lesions, consistent with metastatic disease as on CT today. Portal vein is patent on color Doppler imaging with normal direction of blood flow towards the liver. Other: None. IMPRESSION: No gallstones. Gallbladder wall thickening and small volume pericholecystic fluid are nonspecific. Patient had received pain meds prior to scanning, therefore tenderness to gallbladder palpation could not be evaluated. Hepatic metastasis, as on today's CT. Electronically Signed   By: Abigail Miyamoto M.D.   On: 09/11/2021 17:08   CT CHEST ABDOMEN PELVIS W CONTRAST  Result Date: 09/15/2021 CLINICAL DATA:  Pancreatic cancer; intermittent fever and black tarry stools; * Tracking Code: BO * EXAM: CT CHEST, ABDOMEN, AND PELVIS WITH CONTRAST TECHNIQUE: Multidetector CT imaging of the chest, abdomen and pelvis was performed following the standard protocol during bolus administration of intravenous contrast. RADIATION DOSE REDUCTION: This exam was performed according to the departmental dose-optimization program which includes automated exposure control, adjustment of the mA and/or kV according to patient size and/or use of iterative reconstruction technique. CONTRAST:  157m OMNIPAQUE IOHEXOL 300 MG/ML  SOLN COMPARISON:  CT  abdomen and pelvis dated Aug 05, 2021 FINDINGS: CT CHEST FINDINGS Cardiovascular: Normal heart size. No pericardial effusion. Severe three-vessel coronary artery calcifications. Normal caliber thoracic aorta with mild atherosclerotic disease. Mediastinum/Nodes: Small hiatal hernia. Thyroid is unremarkable. No pathologically enlarged lymph nodes seen in the chest. Lungs/Pleura: Central airways are patent. No consolidation, pleural effusion or pneumothorax. Bibasilar atelectasis. Musculoskeletal: No chest wall mass or suspicious bone lesions identified. CT ABDOMEN PELVIS FINDINGS Hepatobiliary: Numerous new ill-defined peripherally enhancing liver lesions are seen. Reference lesion of the left hepatic lobe measuring 15 x 13 mm on series 2, image 55. Reference new lesion of the right hepatic lobe  measuring 17 x 16 mm on image 65. Decompressed gallbladder with mild wall thickening. Common bile duct stents in place with associated pneumobilia. No intrahepatic biliary ductal dilation. Pancreas: Ill-defined masslike area of the pancreatic head measuring 3.2 x 3.6 cm on image 68, new when compared to the prior exam. Spleen: Normal in size without focal abnormality. Adrenals/Urinary Tract: Adrenal glands are unremarkable. Kidneys are normal, without renal calculi, focal lesion, or hydronephrosis. Bladder is unremarkable. Stomach/Bowel: Stomach is within normal limits. Appendix appears normal. Stool ball in the rectum with no rectal wall thickening. Mild wall thickening of the duodenum. No evidence of obstruction. Vascular/Lymphatic: Aortic atherosclerosis. No enlarged abdominal or pelvic lymph nodes. Reproductive: Mild prostatomegaly. Other: New trace ascites.  No free air. Musculoskeletal: No acute or significant osseous findings. IMPRESSION: 1. New masslike area of the pancreatic head, concerning for progressive disease. 2. Multiple new irregular peripherally enhancing liver lesions, concerning for hepatic metastatic  disease. 3. New trace abdominal ascites, concerning for peritoneal carcinomatosis. 4. Mild wall thickening of the duodenum, findings can be seen in the setting of duodenitis. 5. Common bile duct stents in place with associated pneumobilia. No intrahepatic biliary ductal dilation. 6. Decompressed gallbladder with mild wall thickening, likely reactive. If there is clinical concern for acute cholecystitis, gallbladder ultrasound could be performed for further evaluation. 7. Aortic Atherosclerosis (ICD10-I70.0). Electronically Signed   By: Yetta Glassman M.D.   On: 08/30/2021 11:20        Scheduled Meds:  pantoprazole (PROTONIX) IV  40 mg Intravenous Q12H   Continuous Infusions:  sodium chloride 100 mL/hr at 09/14/21 0030   ceFEPime (MAXIPIME) IV 2 g (09/14/21 1761)   metronidazole 500 mg (09/14/21 0421)   vancomycin 1,750 mg (08/21/2021 1817)          Aline August, MD [facility] 09/14/2021, 8:03 AM

## 2021-09-14 NOTE — Progress Notes (Signed)
  Transition of Care [facility]) Screening Note   Patient Details  Name: Casey Pierce. Date of Birth: Aug 29, 1955   [facility]) CM/SW Contact:    Dessa Phi, RN Phone Number: 09/14/2021, 12:08 PM    Transition of Care Department [facility]) has reviewed patient and no TOC needs have been identified at this time. We will continue to monitor patient advancement through interdisciplinary progression rounds. If new patient transition needs arise, please place a TOC consult.

## 2021-09-14 NOTE — Progress Notes (Signed)
Casey Pierce.   DOB:07/28/1955   EG#:[id number]   VPX#:[id number]  Oncology follow up   Subjective: Patient is clinically stable, no new complains, had MRI done today.  He has been afebrile since yesterday.  Still very fatigued, with low appetite, has not been out of bed.   Objective:  Vitals:   09/14/21 1501 09/14/21 1528  BP: 115/73 114/78  Pulse: 82 92  Resp: 20 20  Temp: 98.3 F (36.8 C) 97.9 F (36.6 C)  SpO2: 99% 100%    Body mass index is 24.26 kg/m.  Intake/Output Summary (Last 24 hours) at 09/14/2021 1746 Last data filed at 09/14/2021 1709 Gross per 24 hour  Intake 2957.43 ml  Output --  Net 2957.43 ml     Sclerae unicteric  Oropharynx clear  No peripheral adenopathy  Abdomen soft   MSK no focal spinal tenderness, no peripheral edema  Neuro nonfocal    CBG (last 3)  No results for input(s): "GLUCAP" in the last 72 hours.    Labs:   Urine Studies No results for input(s): "UHGB", "CRYS" in the last 72 hours.  Invalid input(s): "UACOL", "UAPR", "USPG", "UPH", "UTP", "UGL", "UKET", "UBIL", "UNIT", "UROB", "ULEU", "UEPI", "UWBC", "URBC", "UBAC", "CAST", "UCOM", "BILUA"  Basic Metabolic Panel: Recent Labs  Lab 09/10/21 2300 09/06/2021 0540 09/14/2021 0526 09/14/21 0457  NA 132* 133* 134* 133*  K 3.7 3.8 3.9 3.6  CL 100 100 105 105  CO2 '25 24 23 22  '$ GLUCOSE 251* 191* 158* 133*  BUN '13 12 11 12  '$ CREATININE 0.46* 0.44* 0.46* 0.48*  CALCIUM 8.8* 9.1 9.2 9.1  MG  --   --   --  1.6*   GFR Estimated Creatinine Clearance: 77.9 mL/min (A) (by C-G formula based on SCr of 0.48 mg/dL (L)). Liver Function Tests: Recent Labs  Lab 09/10/21 2300 08/29/2021 1630 09/15/2021 0526 09/14/21 0457  AST 65* 58* 38 27  ALT 113* 113* 89* 62*  ALKPHOS 103 103 98 84  BILITOT 0.6 0.9 0.8 0.7  PROT 6.0* 5.9* 5.5* 5.0*  ALBUMIN 2.3* 2.3* 2.1* 1.9*   No results for input(s): "LIPASE", "AMYLASE" in the last 168 hours. No results for input(s): "AMMONIA" in the last 168  hours. Coagulation profile Recent Labs  Lab 09/10/21 2300 08/18/2021 0526  INR 1.5* 1.4*    CBC: Recent Labs  Lab 09/10/21 2300 09/09/2021 0540 09/08/2021 0526 09/14/21 0457  WBC 12.8* 16.3* 15.1* 14.1*  NEUTROABS 10.7* 14.5*  --  12.3*  HGB 8.7* 8.8* 7.8* 7.3*  HCT 26.4* 26.3* 23.9* 22.9*  MCV 99.2 99.2 100.4* 100.4*  PLT 114* 127* 112* 104*   Cardiac Enzymes: No results for input(s): "CKTOTAL", "CKMB", "CKMBINDEX", "TROPONINI" in the last 168 hours. BNP: Invalid input(s): "POCBNP" CBG: Recent Labs  Lab 09/11/21 0311  GLUCAP 141*   D-Dimer No results for input(s): "DDIMER" in the last 72 hours. Hgb A1c No results for input(s): "HGBA1C" in the last 72 hours. Lipid Profile No results for input(s): "CHOL", "HDL", "LDLCALC", "TRIG", "CHOLHDL", "LDLDIRECT" in the last 72 hours. Thyroid function studies No results for input(s): "TSH", "T4TOTAL", "T3FREE", "THYROIDAB" in the last 72 hours.  Invalid input(s): "FREET3" Anemia work up No results for input(s): "VITAMINB12", "FOLATE", "FERRITIN", "TIBC", "IRON", "RETICCTPCT" in the last 72 hours. Microbiology Recent Results (from the past 240 hour(s))  Culture, blood (Routine x 2)     Status: None (Preliminary result)   Collection Time: 09/10/21 11:00 PM   Specimen: BLOOD  Result Value Ref Range  Status   Specimen Description   Final    BLOOD LEFT ANTECUBITAL Performed at [facility] [address], [place], [place] [address]    Special Requests   Final    BOTTLES DRAWN AEROBIC AND ANAEROBIC Blood Culture adequate volume Performed at [facility] [address] [place], [place] [address]    Culture   Final    NO GROWTH 3 DAYS Performed at [facility], [place] [address] [place], [place] [address]    Report Status PENDING  Incomplete  Culture, blood (Routine x 2)     Status: None (Preliminary result)   Collection Time: 09/10/21 11:00 PM   Specimen: BLOOD  Result Value Ref  Range Status   Specimen Description   Final    BLOOD LEFT ANTECUBITAL Performed at [facility] [address] [place], [place] [address]    Special Requests   Final    BOTTLES DRAWN AEROBIC AND ANAEROBIC Blood Culture adequate volume Performed at [facility] [address], [place], [place] [address]    Culture   Final    NO GROWTH 3 DAYS Performed at [facility], [place] [address], [place], [place] [address]    Report Status PENDING  Incomplete  Blood culture (routine x 2)     Status: None (Preliminary result)   Collection Time: 08/22/2021  5:40 AM   Specimen: BLOOD  Result Value Ref Range Status   Specimen Description BLOOD RIGHT ANTECUBITAL  Final   Special Requests   Final    BOTTLES DRAWN AEROBIC AND ANAEROBIC Blood Culture results may not be optimal due to an excessive volume of blood received in culture bottles   Culture   Final    NO GROWTH 2 DAYS Performed at [facility], [place] [address], [place], [place] [address]    Report Status PENDING  Incomplete  Blood culture (routine x 2)     Status: None (Preliminary result)   Collection Time: 09/15/2021  6:00 AM   Specimen: BLOOD LEFT WRIST  Result Value Ref Range Status   Specimen Description   Final    BLOOD LEFT WRIST Performed at [facility] [address], [place], [place] [address]    Special Requests   Final    BOTTLES DRAWN AEROBIC AND ANAEROBIC Blood Culture results may not be optimal due to an excessive volume of blood received in culture bottles   Culture   Final    NO GROWTH 2 DAYS Performed at [facility], [place] [address], [place], [place] [address]    Report Status PENDING  Incomplete  Resp Panel by RT-PCR (Flu A&B, Covid) Anterior Nasal Swab     Status: None   Collection Time: 08/30/2021  8:01 AM   Specimen: Anterior Nasal Swab  Result Value Ref Range Status   SARS Coronavirus 2 by RT PCR NEGATIVE NEGATIVE Final    Comment:  (NOTE) SARS-CoV-2 target nucleic acids are NOT DETECTED.  The SARS-CoV-2 RNA is generally detectable in upper respiratory specimens during the acute phase of infection. The lowest concentration of SARS-CoV-2 viral copies this assay can detect is 138 copies/mL. A negative result does not preclude SARS-Cov-2 infection and should not be used as the sole basis for treatment or other patient management decisions. A negative result may occur with  improper specimen collection/handling, submission of specimen other than nasopharyngeal swab, presence of viral mutation(s) within the areas targeted by this  assay, and inadequate number of viral copies(<138 copies/mL). A negative result must be combined with clinical observations, patient history, and epidemiological information. The expected result is Negative.  Fact Sheet for Patients:  EntrepreneurPulse.com.au  Fact Sheet for Healthcare Providers:  IncredibleEmployment.be  This test is no t yet approved or cleared by the [place] FDA and  has been authorized for detection and/or diagnosis of SARS-CoV-2 by FDA under an Emergency Use Authorization (EUA). This EUA will remain  in effect (meaning this test can be used) for the duration of the COVID-19 declaration under Section 564(b)(1) of the Act, 21 U.S.C.section 360bbb-3(b)(1), unless the authorization is terminated  or revoked sooner.       Influenza A by PCR NEGATIVE NEGATIVE Final   Influenza B by PCR NEGATIVE NEGATIVE Final    Comment: (NOTE) The Xpert Xpress SARS-CoV-2/FLU/RSV plus assay is intended as an aid in the diagnosis of influenza from Nasopharyngeal swab specimens and should not be used as a sole basis for treatment. Nasal washings and aspirates are unacceptable for Xpert Xpress SARS-CoV-2/FLU/RSV testing.  Fact Sheet for Patients: EntrepreneurPulse.com.au  Fact Sheet for Healthcare  Providers: IncredibleEmployment.be  This test is not yet approved or cleared by the [place] FDA and has been authorized for detection and/or diagnosis of SARS-CoV-2 by FDA under an Emergency Use Authorization (EUA). This EUA will remain in effect (meaning this test can be used) for the duration of the COVID-19 declaration under Section 564(b)(1) of the Act, 21 U.S.C. section 360bbb-3(b)(1), unless the authorization is terminated or revoked.  Performed at [facility], [place] [address] [place], [place] [address]       Studies:  MR 3D Recon At Scanner  Result Date: 09/14/2021 CLINICAL DATA:  Follow-up pancreatic carcinoma. New liver lesions on recent CT. EXAM: MRI ABDOMEN WITHOUT AND WITH CONTRAST (INCLUDING MRCP) TECHNIQUE: Multiplanar multisequence MR imaging of the abdomen was performed both before and after the administration of intravenous contrast. Heavily T2-weighted images of the biliary and pancreatic ducts were obtained, and three-dimensional MRCP images were rendered by post processing. CONTRAST:  31m GADAVIST GADOBUTROL 1 MMOL/ML IV SOLN COMPARISON:  CT on 08/05/2021 FINDINGS: Lower chest: Tiny bilateral pleural effusions. Hepatobiliary: Innumerable rim enhancing masses are seen throughout the liver which are new and consistent with diffuse liver metastases. Gallbladder is unremarkable. No evidence of biliary ductal dilatation, with common bile duct stent remains in place. Pancreas: Soft tissue mass in the pancreatic head surrounding the common bile duct stent measures 5.9 x 5.0 cm, significantly increased in size since prior study. Diffuse pancreatic ductal dilatation is also noted. Spleen:  Within normal limits in size and appearance. Adrenals/Urinary Tract: No masses identified. No evidence of hydronephrosis. Stomach/Bowel: Unremarkable. Vascular/Lymphatic: No pathologically enlarged lymph nodes identified. No acute vascular findings. Other:   Mild-to-moderate ascites is new since previous study. Musculoskeletal:  No suspicious bone lesions identified. IMPRESSION: New diffuse liver metastases. Increased size of soft tissue mass in the pancreatic head surrounding the common bile duct stent, consistent with primary pancreatic carcinoma. New mild-to-moderate ascites and tiny bilateral pleural effusions. Electronically Signed   By: JMarlaine HindM.D.   On: 09/14/2021 15:05   MR ABDOMEN MRCP W WO CONTAST  Result Date: 09/14/2021 CLINICAL DATA:  Follow-up pancreatic carcinoma. New liver lesions on recent CT. EXAM: MRI ABDOMEN WITHOUT AND WITH CONTRAST (INCLUDING MRCP) TECHNIQUE: Multiplanar multisequence MR imaging of the abdomen was performed both before and after the administration of intravenous contrast. Heavily T2-weighted images of the biliary  and pancreatic ducts were obtained, and three-dimensional MRCP images were rendered by post processing. CONTRAST:  83m GADAVIST GADOBUTROL 1 MMOL/ML IV SOLN COMPARISON:  CT on 08/05/2021 FINDINGS: Lower chest: Tiny bilateral pleural effusions. Hepatobiliary: Innumerable rim enhancing masses are seen throughout the liver which are new and consistent with diffuse liver metastases. Gallbladder is unremarkable. No evidence of biliary ductal dilatation, with common bile duct stent remains in place. Pancreas: Soft tissue mass in the pancreatic head surrounding the common bile duct stent measures 5.9 x 5.0 cm, significantly increased in size since prior study. Diffuse pancreatic ductal dilatation is also noted. Spleen:  Within normal limits in size and appearance. Adrenals/Urinary Tract: No masses identified. No evidence of hydronephrosis. Stomach/Bowel: Unremarkable. Vascular/Lymphatic: No pathologically enlarged lymph nodes identified. No acute vascular findings. Other:  Mild-to-moderate ascites is new since previous study. Musculoskeletal:  No suspicious bone lesions identified. IMPRESSION: New diffuse liver  metastases. Increased size of soft tissue mass in the pancreatic head surrounding the common bile duct stent, consistent with primary pancreatic carcinoma. New mild-to-moderate ascites and tiny bilateral pleural effusions. Electronically Signed   By: JMarlaine HindM.D.   On: 09/14/2021 15:05    Assessment: 66y.o. male   Recurrent fever, infection vs tumor fever  Stage IIb pancreatic adenocarcinoma, post neoadjuvant chemotherapy, now with new liver lesions highly concerning for metastasis Fatigue, anorexia and weight loss, likely secondary to his cancer progression Worsening anemia, from duodenitis and related GI bleeding and underline cancer  CAD and AF  History of CVA Significant duodenitis, possible related to pancreatic cancer   Plan:  -I reviewed his abdominal MRI scan images and discussed the findings with patient.  He unfortunately has diffuse liver metastasis, and a new onset ascites.  IR has seen the patient, plan to do liver biopsy tomorrow, we can also consider diagnostic paracentesis if liver biopsy is difficult or high risk for bleeding. -Appreciate GI input, no plan for stent exchange at this point. -Blood culture been negative so far. -Discharge and antibiotics course per primary team. -If he gets discharged over the weekend, I will follow-up next Thursday or Friday.  YTruitt Merle MD 09/14/2021

## 2021-09-14 NOTE — Progress Notes (Signed)
Initial Nutrition Assessment  DOCUMENTATION CODES:   Non-severe (moderate) malnutrition in context of chronic illness  INTERVENTION:  - will order 1 tablet multivitamin with minerals/day.  - will order 250 mg florastor BID.  - will place High Calorie, High Protein Nutrition Therapy handout in AVS.  - wife/family to bring vanilla Boost Plus.   NUTRITION DIAGNOSIS:   Moderate Malnutrition related to chronic illness, cancer and cancer related treatments as evidenced by mild fat depletion, mild muscle depletion.  GOAL:   Patient will meet greater than or equal to 90% of their needs  MONITOR:   PO intake, Supplement acceptance, Labs, Weight trends  REASON FOR ASSESSMENT:   Malnutrition Screening Tool  ASSESSMENT:   66 y.o. male with medical history of CAD s/p stents, CVA, permanent A-fib, pancreatic cancer recently completed chemo, and MI. He presented to the ED due to fever, abdominal pain, fatigue, poor appetite, nausea, and melena. In the ED, CT abdomen/pelvis and chest showed mass-like area of the pancreatic head concerning for progressive disease, metastatic disease, concern for peritoneal carcinomatosis, duodenitis.  GI was consulted.  Patient laying in bed with wife at bedside. Patient mainly resting; he provides information related to symptoms and wife provides information related to PO intakes.  Patient had been experiencing cold sensitivity while receiving FOLFIRNOX which would often improve as he got further out from a cycle. His last chemo was ~5 weeks ago. Possible plan to resume chemo in a few weeks.   For the past 3 weeks appetite has been poor and he has mainly been consuming 4-5 bottles of very vanilla Boost Plus/day with at least 1/day being a smoothie made of ice cream, Boost, banana, and strawberries.   Discussed other items to add to smoothies, protein powders for addition to smoothies and to dishes such as casseroles, and addition of probiotic.   He denies  abdominal pain, pressure, or nausea today.   He was able to eat some oatmeal and part of a biscuit this morning. At home he would sometimes eat soft foods such as tilapia. He and wife share that he was prescribed several appetite stimulant regimens but none of them were beneficial.   Weight yesterday was documented as 160 lb and weight on 07/26/21 as 151 lb.     Labs reviewed; Na: 133 mmol/l, creatinine: 0.48 mg/dl, Mg: 1.6 mg/dl.  Medications reviewed; 2 g IV Mg sulfate x2 run 6/29, 40 mg IV protonix BID.  IVF; NS @ 100 ml/hr.     NUTRITION - FOCUSED PHYSICAL EXAM:  Flowsheet Row Most Recent Value  Orbital Region Mild depletion  Upper Arm Region Mild depletion  Thoracic and Lumbar Region Unable to assess  Buccal Region Mild depletion  [place] Region Mild depletion  Clavicle Bone Region Moderate depletion  Clavicle and Acromion Bone Region Moderate depletion  Scapular Bone Region Unable to assess  Dorsal Hand No depletion  Patellar Region Mild depletion  Anterior Thigh Region Mild depletion  Posterior Calf Region Mild depletion  Edema (RD Assessment) None  Hair Reviewed  Eyes Reviewed  Mouth Reviewed  [dry tongue, denies oral or esophageal pain]  Skin Reviewed  Nails Reviewed       Diet Order:   Diet Order             DIET SOFT Room service appropriate? Yes; Fluid consistency: Thin  Diet effective now                   EDUCATION NEEDS:   Education needs  have been addressed  Skin:  Skin Assessment: Reviewed RN Assessment  Last BM:  PTA/unknown  Height:   Ht Readings from Last 1 Encounters:  09/01/2021 '5\' 5"'$  (1.651 m)    Weight:   Wt Readings from Last 1 Encounters:  09/14/21 66.1 kg     BMI:  Body mass index is 24.26 kg/m.  Estimated Nutritional Needs:  Kcal:  2100-2400 kcal Protein:  105-120 grams Fluid:  >/= 2.4 L/day     Jarome Matin, MS, RD, LDN, CNSC Registered Dietitian II Inpatient Clinical Nutrition RD pager # and  on-call/weekend pager # available in [facility]

## 2021-09-14 NOTE — Progress Notes (Signed)
[facility] Gastroenterology Progress Note  Casey Pierce. 66 y.o. 1955-11-25  CC:  Melena   Subjective: Patient seen and examined at bedside, wife present in the room.  Patient states he is feeling much better today.  Denies any repeat episodes of dark tarry stools.  Tolerating diet and denies nausea, vomiting, abdominal pain, fever, chills.  ROS : Review of Systems  Constitutional:  Negative for chills and fever.  Gastrointestinal:  Negative for abdominal pain, blood in stool, constipation, diarrhea, heartburn, melena, nausea and vomiting.  Musculoskeletal:  Negative for falls.      Objective: Vital signs in last 24 hours: Vitals:   08/30/2021 1455 09/14/21 0421  BP: 112/77 107/61  Pulse: 87 98  Resp: 20 18  Temp: 98.2 F (36.8 C) 98.9 F (37.2 C)  SpO2:  96%    Physical Exam:  General:  Alert, cooperative, no distress, appears stated age  Head:  Normocephalic, without obvious abnormality, atraumatic  Eyes:  Anicteric sclera, EOM's intact  Lungs:   Clear to auscultation bilaterally, respirations unlabored  Heart:  Regular rate and rhythm, S1, S2 normal  Abdomen:   Soft, non-tender, bowel sounds active all four quadrants,  no masses   Extremities: Extremities normal, atraumatic, no  edema  Pulses: 2+ and symmetric    Lab Results: Recent Labs    08/30/2021 0526 09/14/21 0457  NA 134* 133*  K 3.9 3.6  CL 105 105  CO2 23 22  GLUCOSE 158* 133*  BUN 11 12  CREATININE 0.46* 0.48*  CALCIUM 9.2 9.1  MG  --  1.6*   Recent Labs    08/26/2021 0526 09/14/21 0457  AST 38 27  ALT 89* 62*  ALKPHOS 98 84  BILITOT 0.8 0.7  PROT 5.5* 5.0*  ALBUMIN 2.1* 1.9*   Recent Labs    09/10/2021 0540 08/29/2021 0526 09/14/21 0457  WBC 16.3* 15.1* 14.1*  NEUTROABS 14.5*  --  12.3*  HGB 8.8* 7.8* 7.3*  HCT 26.3* 23.9* 22.9*  MCV 99.2 100.4* 100.4*  PLT 127* 112* 104*   Recent Labs    09/06/2021 0526  LABPROT 16.7*  INR 1.4*    Assessment Melena - Hgb 7.3, decreased -  Patient underwent EGD 09/11/2021, found to have significant duodenitis, possibly related to pancreatic cancer. - EGD 09/06/2021 - LA grade a reflux esophagitis with no bleeding.  Gastritis.  Duodenitis with hemorrhage.  Stents in place in the duodenum.   Fever of unknown origin -Improving leukocytosis with WBC 14.1 this morning.   Pancreatic cancer - Diagnosed December 2022, now follows with oncology - S/p ERCP with stent placement May 2023 - Scheduled for surgery 09/20/2021 - CT A/P 09/11/2021: 1. New masslike area of the pancreatic head, concerning for progressive disease. 2. Multiple new irregular peripherally enhancing liver lesions, concerning for hepatic metastatic disease. 3. New trace abdominal ascites, concerning for peritoneal carcinomatosis. 4. Mild wall thickening of the duodenum, findings can be seen in the setting of duodenitis. 5. Common bile duct stents in place with associated pneumobilia. No intrahepatic biliary ductal dilation. 6. Decompressed gallbladder with mild wall thickening, likely reactive. If there is clinical concern for acute cholecystitis, gallbladder ultrasound could be performed for further evaluation. 7. Aortic Atherosclerosis (ICD10-I70.0). - New liver lesions highly concerning for metastatic disease, patient to have MRI to further evaluate  Plan: Most likely source of patient's melena is diffuse mucosal bleeding from the duodenum. Recommend to hold Eliquis for at least 1 week. Recommend Protonix 40 mg twice a day  indefinitely. Continue soft diet. Continue daily CBC and transfuse as needed to maintain HGB > 7  [facility] will follow.  Angelique Holm PA-C 09/14/2021, 10:25 AM  Contact #  [phone]

## 2021-09-14 NOTE — Consult Note (Addendum)
Chief Complaint: Patient was seen in consultation today for image guided liver mass biopsy Chief Complaint  Patient presents with   Fever     Referring Physician(s): Feng,Y  Supervising Physician: Aletta Edouard  Patient Status: [facility] - In-pt  History of Present Illness: Casey Pierce. is a 66 y.o. male with past medical history of atrial fibrillation, coronary artery disease with prior MI and stenting, prior stroke, and pancreatic cancer with prior biliary stenting who was admitted to [facility] on 6/27 with fever, abdominal pain, fatigue, poor appetite and nausea with melena.  Patient previously on Eliquis- now held.  Recent EGD revealed mild gastritis/esophagitis and significant duodenitis, no visible tumor invasion to duodenum.  MRI of the abdomen today revealed new diffuse liver mets, increased size of soft tissue mass in the pancreatic head surrounding common bile duct c/w  with pancreatic cancer, new mild to moderate ascites and tiny bilateral pleural effusions.  Latest labs include WBC 14.1, hemoglobin 7.3, platelets 104K, creatinine 0.48, total bilirubin 0.7, PT 16.7, INR 1.4;  latest blood cultures negative to date (strept in May 2023).  Request now received from oncology for image guided liver mass biopsy for further evaluation.   Past Medical History:  Diagnosis Date   Atrial fibrillation ([place])    Coronary artery disease    NSTEMI 12/1998 - LHC:  prox 85-90, RCA prox 30-50, EF 65-70 >> KVQ:QVZD and BMS to LAD; large Dx jailed by stent (90%), EF 60  // Myoview 7/18: normal perfusion, Low Risk   History of echocardiogram    Echo 7/18: mild LVH, EF 60-65, no RWMA, mild MR, mod LAE   Myocardial infarction [facility])    Pancreatic cancer ([place])    Stroke ([place]) 04/2014   Past Surgical History:  Procedure Laterality Date   BILIARY STENT PLACEMENT N/A 03/28/2021   Procedure: BILIARY STENT PLACEMENT;  Surgeon: Clarene Essex, MD;  [facility]: [facility] ENDOS[facility];  Service:  Endoscopy;  Laterality: N/A;   BILIARY STENT PLACEMENT N/A 08/06/2021   Procedure: BILIARY STENT PLACEMENT;  Surgeon: Gatha Mayer, MD;  [facility];  Service: Gastroenterology;  Laterality: N/A;   CARDIOVERSION N/A 06/20/2015   Procedure: CARDIOVERSION;  Surgeon: [name], MD;  Location: [place] ENDOSCOPY;  Service: Cardiovascular;  Laterality: N/A;   COLONOSCOPY WITH PROPOFOL N/A 02/07/2015   Procedure: COLONOSCOPY WITH PROPOFOL;  Surgeon: [name], MD;  [facility];  Service: Endoscopy;  Laterality: N/A;   CORONARY STENT PLACEMENT  2001   ERCP N/A 03/28/2021   Procedure: ENDOSCOPIC RETROGRADE CHOLANGIOPANCREATOGRAPHY (ERCP);  Surgeon: Clarene Essex, MD;  Location: [facility];  Service: Endoscopy;  Laterality: N/A;   ERCP N/A 08/06/2021   Procedure: ENDOSCOPIC RETROGRADE CHOLANGIOPANCREATOGRAPHY (ERCP);  Surgeon: Gatha Mayer, MD;  Location: [facility];  Service: Gastroenterology;  Laterality: N/A;   ESOPHAGOGASTRODUODENOSCOPY N/A 03/28/2021   Procedure: ESOPHAGOGASTRODUODENOSCOPY (EGD);  Surgeon: Arta Silence, MD;  Location: [facility];  Service: Gastroenterology;  Laterality: N/A;   ESOPHAGOGASTRODUODENOSCOPY N/A 08/25/2021   Procedure: ESOPHAGOGASTRODUODENOSCOPY (EGD);  Surgeon: Otis Brace, MD;  Location: [facility];  Service: Gastroenterology;  Laterality: N/A;   EUS N/A 03/28/2021   Procedure: FULL UPPER ENDOSCOPIC ULTRASOUND (EUS) RADIAL;  Surgeon: Arta Silence, MD;  [facility];  Service: Gastroenterology;  Laterality: N/A;   FINE NEEDLE ASPIRATION N/A 03/28/2021   Procedure: FINE NEEDLE ASPIRATION (FNA) LINEAR;  Surgeon: Arta Silence, MD;  [facility];  Service: Gastroenterology;  Laterality: N/A;   [place]  PLACEMENT N/A 04/18/2021   Procedure: PORT PLACEMENT;  Surgeon: Stark Klein, MD;  Location: [place];  Service: General;  Laterality: N/A;   SPHINCTEROTOMY  03/28/2021    Procedure: Joan Mayans;  Surgeon: Clarene Essex, MD;  Location: [facility];  Service: Endoscopy;;           Past Surgical History:  Procedure Laterality Date   BILIARY STENT PLACEMENT N/A 03/28/2021   Procedure: BILIARY STENT PLACEMENT;  Surgeon: Clarene Essex, MD;  [facility];  Service: Endoscopy;  Laterality: N/A;   BILIARY STENT PLACEMENT N/A 08/06/2021   Procedure: BILIARY STENT PLACEMENT;  Surgeon: Gatha Mayer, MD;  [facility];  Service: Gastroenterology;  Laterality: N/A;   CARDIOVERSION N/A 06/20/2015   Procedure: CARDIOVERSION;  Surgeon: [name], MD;  Location: [place] ENDOSCOPY;  Service: Cardiovascular;  Laterality: N/A;   COLONOSCOPY WITH PROPOFOL N/A 02/07/2015   Procedure: COLONOSCOPY WITH PROPOFOL;  Surgeon: [name], MD;  [facility];  Service: Endoscopy;  Laterality: N/A;   CORONARY STENT PLACEMENT  2001   ERCP N/A 03/28/2021   Procedure: ENDOSCOPIC RETROGRADE CHOLANGIOPANCREATOGRAPHY (ERCP);  Surgeon: Clarene Essex, MD;  Location: [facility];  Service: Endoscopy;  Laterality: N/A;   ERCP N/A 08/06/2021   Procedure: ENDOSCOPIC RETROGRADE CHOLANGIOPANCREATOGRAPHY (ERCP);  Surgeon: Gatha Mayer, MD;  Location: [facility];  Service: Gastroenterology;  Laterality: N/A;   ESOPHAGOGASTRODUODENOSCOPY N/A 03/28/2021   Procedure: ESOPHAGOGASTRODUODENOSCOPY (EGD);  Surgeon: Arta Silence, MD;  Location: [facility];  Service: Gastroenterology;  Laterality: N/A;   ESOPHAGOGASTRODUODENOSCOPY N/A 09/11/2021   Procedure: ESOPHAGOGASTRODUODENOSCOPY (EGD);  Surgeon: Otis Brace, MD;  Location: [facility];  Service: Gastroenterology;  Laterality: N/A;   EUS N/A 03/28/2021   Procedure: FULL UPPER ENDOSCOPIC ULTRASOUND (EUS) RADIAL;  Surgeon: Arta Silence, MD;  [facility];  Service: Gastroenterology;  Laterality: N/A;   FINE NEEDLE ASPIRATION N/A 03/28/2021   Procedure: FINE NEEDLE ASPIRATION (FNA) LINEAR;   Surgeon: Arta Silence, MD;  [facility];  Service: Gastroenterology;  Laterality: N/A;   KNEE SURGERY  1972   PORTACATH PLACEMENT N/A 04/18/2021   Procedure: PORT PLACEMENT;  Surgeon: Stark Klein, MD;  Location: [place];  Service: General;  Laterality: N/A;   SPHINCTEROTOMY  03/28/2021   Procedure: SPHINCTEROTOMY;  Surgeon: Clarene Essex, MD;  [facility];  Service: Endoscopy;;    Allergies: Patient has no known allergies.  Medications: Prior to Admission medications   Medication Sig Start Date End Date Taking? Authorizing Provider  dronabinol (MARINOL) 5 MG capsule Take 1 capsule (5 mg total) by mouth 2 (two) times daily before a meal. Patient taking differently: Take 10 mg by mouth 2 (two) times daily before a meal. 09/01/21  Yes Truitt Merle, MD  ELIQUIS 5 MG TABS tablet TAKE 1 TABLET BY MOUTH TWICE A DAY Patient taking differently: Take 5 mg by mouth 2 (two) times daily. 08/23/21  Yes Deboraha Sprang, MD  famotidine (PEPCID) 20 MG tablet Take 20 mg by mouth at bedtime as needed for heartburn or indigestion.   Yes [provider]  lidocaine-prilocaine (EMLA) cream Apply to affected area once Patient taking differently: Apply 1 application  topically daily as needed. Apply to affected area 04/11/21  Yes Truitt Merle, MD  Multiple Vitamin (MULTI-VITAMIN DAILY PO) Take 1 tablet by mouth in the morning and at bedtime.   Yes [provider]  Omega-3 Fatty Acids (FISH OIL PO) Take 1 capsule by mouth daily.   Yes [provider]  prochlorperazine (COMPAZINE) 10 MG tablet Take 1 tablet (10 mg total) by mouth every 6 (six) hours as needed (Nausea or vomiting). 08/23/21  Yes Burns, Wandra Feinstein, NP  urea (CARMOL) 20 % lotion Apply 1 Application topically as needed (for rash).   Yes [provider]     Family History  Problem Relation Age of Onset   Cancer Father 62       tongue/neck   Heart attack Maternal Uncle    Cancer  Paternal Uncle        unk type    Social History   Socioeconomic History   Marital status: Married    Spouse name: Patty   Number of children: 3   Years of education: Associate   Highest education level: Not on file  Occupational History   Occupation: [employer]   Tobacco Use   Smoking status: Never   Smokeless tobacco: Former    Types: Chew    Quit date: 03/20/1999   Tobacco comments:    quit chewing 7 years ago  Vaping Use   Vaping Use: Never used  Substance and Sexual Activity   Alcohol use: No    Alcohol/week: 0.0 standard drinks of alcohol   Drug use: No   Sexual activity: Not Currently  Other Topics Concern   Not on file  Social History Narrative   Lives at home with wife.    Caffeine: Drinks coffee- about 3 cups per day   Social Determinants of Health   Financial Resource Strain: Not on file  Food Insecurity: Not on file  Transportation Needs: Not on file  Physical Activity: Not on file  Stress: Not on file  Social Connections: Not on file      Review of Systems denies fever, headache, chest pain, worsening dyspnea, cough, back pain, nausea, vomiting or bleeding.  He has had weight loss, some intermittent abdominal discomfort.  Vital Signs: BP 114/78 (BP Location: Right Arm)   Pulse 92   Temp 97.9 F (36.6 C) (Oral)   Resp 20   Ht '5\' 5"'$  (1.651 m)   Wt 145 lb 12.8 oz (66.1 kg)   SpO2 100%   BMI 24.26 kg/m     Physical Exam patient awake, alert.  Chest with diminished breath sounds bases, left chest wall Port-A-Cath in place, heart with normal rate, irregular rhythm; abdomen soft, positive bowel sounds, nontender.  No sig peripheral edema   Imaging: MR 3D Recon At Scanner  Result Date: 09/14/2021 CLINICAL DATA:  Follow-up pancreatic carcinoma. New liver lesions on recent CT. EXAM: MRI ABDOMEN WITHOUT AND WITH CONTRAST (INCLUDING MRCP) TECHNIQUE: Multiplanar multisequence MR imaging of the abdomen was performed both before and after the  administration of intravenous contrast. Heavily T2-weighted images of the biliary and pancreatic ducts were obtained, and three-dimensional MRCP images were rendered by post processing. CONTRAST:  48m GADAVIST GADOBUTROL 1 MMOL/ML IV SOLN COMPARISON:  CT on 08/05/2021 FINDINGS: Lower chest: Tiny bilateral pleural effusions. Hepatobiliary: Innumerable rim enhancing masses are seen throughout the liver which are new and consistent with diffuse liver metastases. Gallbladder is unremarkable. No evidence of biliary ductal dilatation, with common bile duct stent remains in place. Pancreas: Soft tissue mass in the pancreatic head surrounding the common bile duct stent measures 5.9 x 5.0 cm, significantly increased in size since prior study. Diffuse pancreatic ductal dilatation is also noted. Spleen:  Within normal limits in size and appearance. Adrenals/Urinary Tract: No masses identified. No evidence of hydronephrosis. Stomach/Bowel: Unremarkable.  Vascular/Lymphatic: No pathologically enlarged lymph nodes identified. No acute vascular findings. Other:  Mild-to-moderate ascites is new since previous study. Musculoskeletal:  No suspicious bone lesions identified. IMPRESSION: New diffuse liver metastases. Increased size of soft tissue mass in the pancreatic head surrounding the common bile duct stent, consistent with primary pancreatic carcinoma. New mild-to-moderate ascites and tiny bilateral pleural effusions. Electronically Signed   By: Marlaine Hind M.D.   On: 09/14/2021 15:05   MR ABDOMEN MRCP W WO CONTAST  Result Date: 09/14/2021 CLINICAL DATA:  Follow-up pancreatic carcinoma. New liver lesions on recent CT. EXAM: MRI ABDOMEN WITHOUT AND WITH CONTRAST (INCLUDING MRCP) TECHNIQUE: Multiplanar multisequence MR imaging of the abdomen was performed both before and after the administration of intravenous contrast. Heavily T2-weighted images of the biliary and pancreatic ducts were obtained, and three-dimensional MRCP  images were rendered by post processing. CONTRAST:  26m GADAVIST GADOBUTROL 1 MMOL/ML IV SOLN COMPARISON:  CT on 08/05/2021 FINDINGS: Lower chest: Tiny bilateral pleural effusions. Hepatobiliary: Innumerable rim enhancing masses are seen throughout the liver which are new and consistent with diffuse liver metastases. Gallbladder is unremarkable. No evidence of biliary ductal dilatation, with common bile duct stent remains in place. Pancreas: Soft tissue mass in the pancreatic head surrounding the common bile duct stent measures 5.9 x 5.0 cm, significantly increased in size since prior study. Diffuse pancreatic ductal dilatation is also noted. Spleen:  Within normal limits in size and appearance. Adrenals/Urinary Tract: No masses identified. No evidence of hydronephrosis. Stomach/Bowel: Unremarkable. Vascular/Lymphatic: No pathologically enlarged lymph nodes identified. No acute vascular findings. Other:  Mild-to-moderate ascites is new since previous study. Musculoskeletal:  No suspicious bone lesions identified. IMPRESSION: New diffuse liver metastases. Increased size of soft tissue mass in the pancreatic head surrounding the common bile duct stent, consistent with primary pancreatic carcinoma. New mild-to-moderate ascites and tiny bilateral pleural effusions. Electronically Signed   By: JMarlaine HindM.D.   On: 09/14/2021 15:05   UKoreaAbdomen Limited RUQ (LIVER/GB)  Result Date: 08/24/2021 CLINICAL DATA:  Gallbladder wall thickening on CT. History of pancreatic cancer. EXAM: ULTRASOUND ABDOMEN LIMITED RIGHT UPPER QUADRANT COMPARISON:  CT of earlier today FINDINGS: Gallbladder: No gallstones. Gallbladder wall thickening at up to 7 mm. Gas within the gallbladder is likely related to common duct stent in place. Trace pericholecystic fluid. Sonographic Murphy's sign could not be evaluated as patient had received pain medications. Common bile duct: Diameter: Dilated at 16 mm. Common duct stent in place on today's CT.  Liver: Multiple liver lesions, consistent with metastatic disease as on CT today. Portal vein is patent on color Doppler imaging with normal direction of blood flow towards the liver. Other: None. IMPRESSION: No gallstones. Gallbladder wall thickening and small volume pericholecystic fluid are nonspecific. Patient had received pain meds prior to scanning, therefore tenderness to gallbladder palpation could not be evaluated. Hepatic metastasis, as on today's CT. Electronically Signed   By: KAbigail MiyamotoM.D.   On: 09/10/2021 17:08   CT CHEST ABDOMEN PELVIS W CONTRAST  Result Date: 09/01/2021 CLINICAL DATA:  Pancreatic cancer; intermittent fever and black tarry stools; * Tracking Code: BO * EXAM: CT CHEST, ABDOMEN, AND PELVIS WITH CONTRAST TECHNIQUE: Multidetector CT imaging of the chest, abdomen and pelvis was performed following the standard protocol during bolus administration of intravenous contrast. RADIATION DOSE REDUCTION: This exam was performed according to the departmental dose-optimization program which includes automated exposure control, adjustment of the mA and/or kV according to patient size and/or use of iterative reconstruction technique.  CONTRAST:  11m OMNIPAQUE IOHEXOL 300 MG/ML  SOLN COMPARISON:  CT abdomen and pelvis dated Aug 05, 2021 FINDINGS: CT CHEST FINDINGS Cardiovascular: Normal heart size. No pericardial effusion. Severe three-vessel coronary artery calcifications. Normal caliber thoracic aorta with mild atherosclerotic disease. Mediastinum/Nodes: Small hiatal hernia. Thyroid is unremarkable. No pathologically enlarged lymph nodes seen in the chest. Lungs/Pleura: Central airways are patent. No consolidation, pleural effusion or pneumothorax. Bibasilar atelectasis. Musculoskeletal: No chest wall mass or suspicious bone lesions identified. CT ABDOMEN PELVIS FINDINGS Hepatobiliary: Numerous new ill-defined peripherally enhancing liver lesions are seen. Reference lesion of the left  hepatic lobe measuring 15 x 13 mm on series 2, image 55. Reference new lesion of the right hepatic lobe measuring 17 x 16 mm on image 65. Decompressed gallbladder with mild wall thickening. Common bile duct stents in place with associated pneumobilia. No intrahepatic biliary ductal dilation. Pancreas: Ill-defined masslike area of the pancreatic head measuring 3.2 x 3.6 cm on image 68, new when compared to the prior exam. Spleen: Normal in size without focal abnormality. Adrenals/Urinary Tract: Adrenal glands are unremarkable. Kidneys are normal, without renal calculi, focal lesion, or hydronephrosis. Bladder is unremarkable. Stomach/Bowel: Stomach is within normal limits. Appendix appears normal. Stool ball in the rectum with no rectal wall thickening. Mild wall thickening of the duodenum. No evidence of obstruction. Vascular/Lymphatic: Aortic atherosclerosis. No enlarged abdominal or pelvic lymph nodes. Reproductive: Mild prostatomegaly. Other: New trace ascites.  No free air. Musculoskeletal: No acute or significant osseous findings. IMPRESSION: 1. New masslike area of the pancreatic head, concerning for progressive disease. 2. Multiple new irregular peripherally enhancing liver lesions, concerning for hepatic metastatic disease. 3. New trace abdominal ascites, concerning for peritoneal carcinomatosis. 4. Mild wall thickening of the duodenum, findings can be seen in the setting of duodenitis. 5. Common bile duct stents in place with associated pneumobilia. No intrahepatic biliary ductal dilation. 6. Decompressed gallbladder with mild wall thickening, likely reactive. If there is clinical concern for acute cholecystitis, gallbladder ultrasound could be performed for further evaluation. 7. Aortic Atherosclerosis (ICD10-I70.0). Electronically Signed   By: LYetta GlassmanM.D.   On: 08/26/2021 11:20   DG Chest 2 View  Result Date: 08/28/2021 CLINICAL DATA:  Fever and weakness EXAM: CHEST - 2 VIEW COMPARISON:   09/10/2021 FINDINGS: Left porta catheter with tip at the upper cavoatrial junction. Normal heart size and mediastinal contours. Coronary stenting. No acute infiltrate or edema. No effusion or pneumothorax. No acute osseous findings. Artifact from EKG leads. IMPRESSION: Negative for pneumonia. Electronically Signed   By: JJorje GuildM.D.   On: 08/21/2021 06:27   DG Chest 2 View  Result Date: 09/10/2021 CLINICAL DATA:  Suspected sepsis EXAM: CHEST - 2 VIEW COMPARISON:  04/18/2021 FINDINGS: Left Port-A-Cath remains in place, unchanged. Heart and mediastinal contours are within normal limits. No focal opacities or effusions. No acute bony abnormality. IMPRESSION: No active cardiopulmonary disease. Electronically Signed   By: KRolm BaptiseM.D.   On: 09/10/2021 23:34    Labs:  CBC: Recent Labs    09/10/21 2300 08/30/2021 0540 09/01/2021 0526 09/14/21 0457  WBC 12.8* 16.3* 15.1* 14.1*  HGB 8.7* 8.8* 7.8* 7.3*  HCT 26.4* 26.3* 23.9* 22.9*  PLT 114* 127* 112* 104*    COAGS: Recent Labs    08/05/21 1407 09/10/21 2300 08/21/2021 0526  INR 1.2 1.5* 1.4*  APTT 28  --   --     BMP: Recent Labs    09/10/21 2300 09/15/2021 0540 09/14/2021 0526 09/14/21 0457  NA 132* 133* 134* 133*  K 3.7 3.8 3.9 3.6  CL 100 100 105 105  CO2 '25 24 23 22  '$ GLUCOSE 251* 191* 158* 133*  BUN '13 12 11 12  '$ CALCIUM 8.8* 9.1 9.2 9.1  CREATININE 0.46* 0.44* 0.46* 0.48*  GFRNONAA >60 >60 >60 >60    LIVER FUNCTION TESTS: Recent Labs    09/10/21 2300 09/08/2021 1630 08/24/2021 0526 09/14/21 0457  BILITOT 0.6 0.9 0.8 0.7  AST 65* 58* 38 27  ALT 113* 113* 89* 62*  ALKPHOS 103 103 98 84  PROT 6.0* 5.9* 5.5* 5.0*  ALBUMIN 2.3* 2.3* 2.1* 1.9*    TUMOR MARKERS: No results for input(s): "AFPTM", "CEA", "CA199", "CHROMGRNA" in the last 8760 hours.  Assessment and Plan: 66 y.o. male with past medical history of atrial fibrillation, coronary artery disease with prior MI and stenting, prior stroke, and pancreatic  cancer with prior biliary stenting who was admitted to [facility] on 6/27 with fever, abdominal pain, fatigue, poor appetite and nausea with melena.  Patient previously on Eliquis- now held.  Recent EGD revealed mild gastritis/esophagitis and significant duodenitis, no visible tumor invasion to duodenum.  MRI of the abdomen today revealed new diffuse liver mets, increased size of soft tissue mass in the pancreatic head surrounding common bile duct c/w  with pancreatic cancer, new mild to moderate ascites and tiny bilateral pleural effusions.  Pt afebrile. Latest labs include WBC 14.1, hemoglobin 7.3, platelets 104K,  creatinine 0.48, total bilirubin 0.7, PT 16.7, INR 1.4;  latest blood cultures negative to date ( prev strept in May 2023).  Request now received from oncology for image guided liver mass biopsy for further evaluation.  Currently receiving blood transfusion.  Latest imaging studies have been reviewed by Dr. Kathlene Cote.Risks and benefits of procedure was discussed with the patient and/or patient's family including, but not limited to bleeding, infection, damage to adjacent structures or low yield requiring additional tests.  All of the questions were answered and there is agreement to proceed.  Consent signed and in chart.  Procedure tentatively scheduled for 6/30.    Thank you for this interesting consult.  I greatly enjoyed meeting Gearold Wainer. and look forward to participating in their care.  A copy of this report was sent to the requesting provider on this date.  Electronically Signed: D. Rowe Zerrick, PA-C 09/14/2021, 5:08 PM   I spent a total of  25 minutes   in face to face in clinical consultation, greater than 50% of which was counseling/coordinating care for image guided liver mass biopsy

## 2021-09-15 ENCOUNTER — Other Ambulatory Visit: Payer: Self-pay

## 2021-09-15 ENCOUNTER — Inpatient Hospital Stay (HOSPITAL_COMMUNITY): Payer: Medicare PPO

## 2021-09-15 DIAGNOSIS — D72829 Elevated white blood cell count, unspecified: Secondary | ICD-10-CM

## 2021-09-15 DIAGNOSIS — K2980 Duodenitis without bleeding: Secondary | ICD-10-CM | POA: Diagnosis not present

## 2021-09-15 DIAGNOSIS — D638 Anemia in other chronic diseases classified elsewhere: Secondary | ICD-10-CM | POA: Diagnosis not present

## 2021-09-15 DIAGNOSIS — E871 Hypo-osmolality and hyponatremia: Secondary | ICD-10-CM | POA: Diagnosis not present

## 2021-09-15 DIAGNOSIS — A419 Sepsis, unspecified organism: Secondary | ICD-10-CM | POA: Diagnosis not present

## 2021-09-15 LAB — CBC WITH DIFFERENTIAL/PLATELET
Abs Immature Granulocytes: 0.16 10*3/uL — ABNORMAL HIGH (ref 0.00–0.07)
Basophils Absolute: 0 10*3/uL (ref 0.0–0.1)
Basophils Relative: 0 %
Eosinophils Absolute: 0 10*3/uL (ref 0.0–0.5)
Eosinophils Relative: 0 %
HCT: 28.5 % — ABNORMAL LOW (ref 39.0–52.0)
Hemoglobin: 9.7 g/dL — ABNORMAL LOW (ref 13.0–17.0)
Immature Granulocytes: 1 %
Lymphocytes Relative: 5 %
Lymphs Abs: 0.8 10*3/uL (ref 0.7–4.0)
MCH: 33 pg (ref 26.0–34.0)
MCHC: 34 g/dL (ref 30.0–36.0)
MCV: 96.9 fL (ref 80.0–100.0)
Monocytes Absolute: 0.9 10*3/uL (ref 0.1–1.0)
Monocytes Relative: 5 %
Neutro Abs: 15.2 10*3/uL — ABNORMAL HIGH (ref 1.7–7.7)
Neutrophils Relative %: 89 %
Platelets: 74 10*3/uL — ABNORMAL LOW (ref 150–400)
RBC: 2.94 MIL/uL — ABNORMAL LOW (ref 4.22–5.81)
RDW: 16.4 % — ABNORMAL HIGH (ref 11.5–15.5)
WBC: 17.1 10*3/uL — ABNORMAL HIGH (ref 4.0–10.5)
nRBC: 0 % (ref 0.0–0.2)

## 2021-09-15 LAB — ALBUMIN, PLEURAL OR PERITONEAL FLUID: Albumin, Fluid: <1.5 g/dL

## 2021-09-15 LAB — TYPE AND SCREEN
ABO/RH(D): AB POS
Antibody Screen: NEGATIVE
Unit division: 0

## 2021-09-15 LAB — BODY FLUID CELL COUNT WITH DIFFERENTIAL
Eos, Fluid: 0 %
Lymphs, Fluid: 9 %
Monocyte-Macrophage-Serous Fluid: 5 % — ABNORMAL LOW (ref 50–90)
Neutrophil Count, Fluid: 86 % — ABNORMAL HIGH (ref 0–25)
Total Nucleated Cell Count, Fluid: 586 cu mm (ref 0–1000)

## 2021-09-15 LAB — COMPREHENSIVE METABOLIC PANEL
ALT: 55 U/L — ABNORMAL HIGH (ref 0–44)
AST: 47 U/L — ABNORMAL HIGH (ref 15–41)
Albumin: 2 g/dL — ABNORMAL LOW (ref 3.5–5.0)
Alkaline Phosphatase: 111 U/L (ref 38–126)
Anion gap: 8 (ref 5–15)
BUN: 10 mg/dL (ref 8–23)
CO2: 20 mmol/L — ABNORMAL LOW (ref 22–32)
Calcium: 9.1 mg/dL (ref 8.9–10.3)
Chloride: 105 mmol/L (ref 98–111)
Creatinine, Ser: 0.4 mg/dL — ABNORMAL LOW (ref 0.61–1.24)
GFR, Estimated: >60 mL/min (ref >60)
Glucose, Bld: 141 mg/dL — ABNORMAL HIGH (ref 70–99)
Potassium: 4.2 mmol/L (ref 3.5–5.1)
Sodium: 133 mmol/L — ABNORMAL LOW (ref 135–145)
Total Bilirubin: 1.2 mg/dL (ref 0.3–1.2)
Total Protein: 5.3 g/dL — ABNORMAL LOW (ref 6.5–8.1)

## 2021-09-15 LAB — GRAM STAIN

## 2021-09-15 LAB — BPAM RBC
Blood Product Expiration Date: 202307192359
ISSUE DATE / TIME: 202306291447
Unit Type and Rh: 6200

## 2021-09-15 LAB — MAGNESIUM: Magnesium: 1.9 mg/dL (ref 1.7–2.4)

## 2021-09-15 MED ORDER — FENTANYL CITRATE (PF) 100 MCG/2ML IJ SOLN
INTRAMUSCULAR | Status: AC | PRN
  Administered 2021-09-15: 50 ug via INTRAVENOUS

## 2021-09-15 MED ORDER — MIDAZOLAM HCL 2 MG/2ML IJ SOLN
INTRAMUSCULAR | Status: AC | PRN
  Administered 2021-09-15: 1 mg via INTRAVENOUS

## 2021-09-15 MED ORDER — GELATIN ABSORBABLE 12-7 MM EX MISC
CUTANEOUS | Status: AC | PRN
  Administered 2021-09-15: 1 via TOPICAL

## 2021-09-15 MED ORDER — LIDOCAINE HCL 1 % IJ SOLN
INTRAMUSCULAR | Status: AC | PRN
  Administered 2021-09-15: 10 mL via INTRADERMAL

## 2021-09-15 MED ORDER — GELATIN ABSORBABLE 12-7 MM EX MISC
CUTANEOUS | Status: AC
  Filled 2021-09-15: qty 1

## 2021-09-15 MED ORDER — LIDOCAINE HCL 1 % IJ SOLN
INTRAMUSCULAR | Status: AC
  Filled 2021-09-15: qty 20

## 2021-09-15 MED ORDER — FENTANYL CITRATE (PF) 100 MCG/2ML IJ SOLN
INTRAMUSCULAR | Status: AC
  Filled 2021-09-15: qty 2

## 2021-09-15 MED ORDER — MIDAZOLAM HCL 2 MG/2ML IJ SOLN
INTRAMUSCULAR | Status: AC
  Filled 2021-09-15: qty 2

## 2021-09-15 NOTE — Care Management Important Message (Signed)
Important Message  Patient Details  Name: Casey Pierce. MRN: [id number] Date of Birth: 1956/02/12   Medicare Important Message Given:  Yes     Memory Argue 09/15/2021, 2:15 PM

## 2021-09-15 NOTE — Progress Notes (Signed)
Pharmacy Antibiotic Note  Casey Pierce. is a 66 y.o. male admitted on 08/27/2021 with weakness, fever, melena.  Pt has PMH significant for pancreatic cancer, afib, CVA. Pt was started on broad spectrum antibiotics on admission for sepsis unknown source. Oncology following - infectious process vs. concern for possible tumor fever.  Pharmacy has been consulted for cefepime dosing.  Today, 09/15/21 WBC remains elevated SCr stable Cultures negative thus far Afebrile  Today is day #4 of IV antibiotics.  Plan: Continue cefepime 2 g IV q8h + metronidazole Monitor renal function, culture data  Height: '5\' 5"'$  (165.1 cm) Weight: 66.1 kg (145 lb 12.8 oz) IBW/kg (Calculated) : 61.5  Temp (24hrs), Avg:98.6 F (37 C), Min:97.9 F (36.6 C), Max:99.5 F (37.5 C)  Recent Labs  Lab 09/10/21 2300 09/11/21 0131 08/20/2021 0540 09/01/2021 0600 08/21/2021 1630 08/21/2021 2104 09/15/2021 0526 09/14/21 0457 09/15/21 0501  WBC 12.8*  --  16.3*  --   --   --  15.1* 14.1* 17.1*  CREATININE 0.46*  --  0.44*  --   --   --  0.46* 0.48* 0.40*  LATICACIDVEN 1.9 1.4  --  2.9* 2.3* 1.3  --   --   --     Estimated Creatinine Clearance: 77.9 mL/min (A) (by C-G formula based on SCr of 0.4 mg/dL (L)).    No Known Allergies  Antimicrobials this admission: cefepime 6/27 >>  metronidazole 6/27 >>  Vancomycin 6/27 >> 6/28  Dose adjustments this admission:  Microbiology results: 6/27 BCx: ngtd  Lenis Noon, PharmD 09/15/2021 9:30 AM

## 2021-09-15 NOTE — Procedures (Signed)
Interventional Radiology Procedure Note  Procedure:  1) Ultrasound guided liver mass biopsy 2) Ultrasound guided paracentesis  Findings: Please refer to procedural dictation for full description. 61 ga core from an anterior left lobe mass.  Gelfoam needle track embolization.  RUQ paracentesis yielding 100 mL translucent, straw-colored fluid.   Complications: None immediate  Estimated Blood Loss: < 5 mL  Recommendations: Strict 3 hour bedrest. Follow up Pathology/Cytology results.  Ruthann Cancer, MD

## 2021-09-15 NOTE — Progress Notes (Signed)
PROGRESS NOTE    Casey Pierce.  YJE:[id number] DOB: 1955-05-24 DOA: 08/21/2021 PCP: Lavone Orn, MD   Brief Narrative:  66 y.o. male with medical history significant of CAD s/p stents, CVA, permanent A-fib, pancreatic CA recently completed chemotherapy  presented with fever, abdominal pain, fatigue, poor appetite and nausea with melena.  On presentation, he had a temperature of 100.6, was tachycardic.  WBC of 16.3, lactic acid 2.9, influenza and COVID-19 test negative.  CT abdomen/chest/pelvis showed new masslike area of the pancreatic head concerning for progressive disease, metastatic disease, new concerns for peritoneal carcinomatosis, duodenitis.  GI and oncology were consulted.  He underwent EGD on 08/26/2021.  Assessment & Plan:   Sepsis: Present on admission, unknown source -Presented with fever, leukocytosis, tachycardia, lactic acidosis -No source of sepsis identified yet.  Unclear if this is secondary to cancer as well.  Procalcitonin 0.16 on presentation.  UA unremarkable. CT abdomen/chest/pelvis showed new masslike area of the pancreatic head concerning for progressive disease, metastatic disease, new concerns for peritoneal carcinomatosis, duodenitis with some gallbladder thickening, possibly reactive -currently on cefepime and Flagyl.  No evidence of MRSA infection.  Off vancomycin. -Cultures negative so far.  COVID-19 and influenza negative on presentation. -No temperature spikes over the last 48 hours.  Possible upper GI bleeding Possible duodenitis -Reported melanotic stools.  FOBT negative.  Status post 1 unit packed red cell transfusion on 09/14/2021 for hemoglobin of 7.3.  Hemoglobin 9.7 today.  Monitor H&H. -Eliquis on hold. -Continue IV Protonix twice daily. -Status post EGD on 08/18/2021 which showed severe inflammation with contact mucosal oozing/hemorrhagic inflammation in the duodenum including duodenal bulb, first and second portion of the duodenum along with mild  gastritis and esophagitis.  GI recommends to hold Eliquis for at least 1 week and to continue Protonix 40 mg twice a day indefinitely.  GI signed off on 09/14/2021  Lactic acidosis -From infection and dehydration.  Improved.  Leukocytosis -Worsening today.  Monitor.  Hyponatremia -Mild. continue IV fluids. -monitor  Anemia of chronic disease -Plan as above.  Transfuse if hemoglobin is less than 7.5 as per oncology.  Permanent A-fib -Currently rate controlled.  Eliquis on hold.  Not on any rate controlling medication.  Pancreatic cancer with possible metastases Mildly elevated LFTs -CT with possible new progression. -Oncology following.  MRI of the abdomen shows diffuse liver metastasis with new mild to moderate ascites and increased size of soft tissue mass in the pancreatic head consistent with primary pancreatic carcinoma.  IR planning for liver biopsy today.  Might need diagnostic paracentesis as well. -Whipple's surgery planned for next week has been canceled  Thrombocytopenia -Possibly from cancer.  No signs of bleeding.  Monitor.  Moderate malnutrition -Follow nutrition recommendations    DVT prophylaxis: SCDs Code Status: Full Family Communication: Wife and daughter at bedside Disposition Plan: Status is: Inpatient Remains inpatient appropriate because: Of severity of illness.  Need for liver biopsy and possible paracentesis today.  Possible discharge in 24 to 48 hours if remains afebrile and hemodynamically stable.    Consultants: GI/oncology/IR  Procedures: EGD on 08/23/2021 Antimicrobials:  Anti-infectives (From admission, onward)    Start     Dose/Rate Route Frequency Ordered Stop   08/28/2021 2200  ceFEPIme (MAXIPIME) 2 g in sodium chloride 0.9 % 100 mL IVPB        2 g 200 mL/hr over 30 Minutes Intravenous Every 8 hours 09/03/2021 1555     08/20/2021 1700  vancomycin (VANCOREADY) IVPB 1750 mg/350 mL  Status:  Discontinued        1,750 mg 175 mL/hr over 120  Minutes Intravenous Every 24 hours 09/04/2021 1557 09/14/21 0839   08/25/2021 1600  ceFEPIme (MAXIPIME) 2 g in sodium chloride 0.9 % 100 mL IVPB  Status:  Discontinued        2 g 200 mL/hr over 30 Minutes Intravenous  Once 09/05/2021 1550 08/19/2021 1554   08/30/2021 1600  metroNIDAZOLE (FLAGYL) IVPB 500 mg        500 mg 100 mL/hr over 60 Minutes Intravenous Every 12 hours 09/10/2021 1550 Sep 30, 2021 1559   08/17/2021 1600  vancomycin (VANCOCIN) IVPB 1000 mg/200 mL premix  Status:  Discontinued        1,000 mg 200 mL/hr over 60 Minutes Intravenous  Once 08/18/2021 1550 08/31/2021 1555   09/15/2021 1600  vancomycin (VANCOREADY) IVPB 1500 mg/300 mL  Status:  Discontinued        1,500 mg 150 mL/hr over 120 Minutes Intravenous  Once 09/03/2021 1555 08/27/2021 1556   08/31/2021 1515  ceFEPIme (MAXIPIME) 2 g in sodium chloride 0.9 % 100 mL IVPB        2 g 200 mL/hr over 30 Minutes Intravenous  Once 08/18/2021 1506 08/29/2021 1659        Subjective: Patient seen and examined at bedside.  Oral intake is still poor.  Feels weak.  No worsening abdominal pain, fever, vomiting reported. Objective: Vitals:   09/14/21 1752 09/14/21 1827 09/14/21 2039 09/14/21 2043  BP: 122/78 120/77 115/73   Pulse: 94 87 99   Resp: 18  18   Temp: 98.5 F (36.9 C)  98.8 F (37.1 C)   TempSrc: Oral  Oral Oral  SpO2: 100% 98% 98% 96%  Weight:      Height:        Intake/Output Summary (Last 24 hours) at 09/15/2021 0743 Last data filed at 09/14/2021 1752 Gross per 24 hour  Intake 3275.43 ml  Output --  Net 3275.43 ml    Filed Weights   08/25/2021 0536 09/03/2021 1209 09/14/21 1528  Weight: 72.6 kg 72.6 kg 66.1 kg    Examination:  General: No acute distress.  Currently still on room air.  Looks chronically ill and deconditioned. ENT/neck: No palpable neck masses or JVD elevation noted respiratory: Bilateral decreased breath sounds at bases with scattered crackles, no wheezing  CVS: Currently rate controlled; S1 and S2 are heard   abdominal: Soft, nontender, distended mildly; no organomegaly, bowel sounds are heard normally  extremities: No clubbing; mild lower extremity edema present  CNS: Alert; slow to respond.  No focal neurologic deficit.  Moving extremities Lymph: No palpable lymphadenopathy noted Skin: No obvious rashes/petechiae  psych: Mostly flat affect.  Does not participate in conversation much.  No signs of agitation currently. musculoskeletal: No obvious other joint swelling/deformity     Data Reviewed: I have personally reviewed following labs and imaging studies  CBC: Recent Labs  Lab 09/10/21 2300 08/22/2021 0540 08/23/2021 0526 09/14/21 0457 09/14/21 2219 09/15/21 0501  WBC 12.8* 16.3* 15.1* 14.1*  --  17.1*  NEUTROABS 10.7* 14.5*  --  12.3*  --  15.2*  HGB 8.7* 8.8* 7.8* 7.3* 9.4* 9.7*  HCT 26.4* 26.3* 23.9* 22.9* 27.9* 28.5*  MCV 99.2 99.2 100.4* 100.4*  --  96.9  PLT 114* 127* 112* 104*  --  74*    Basic Metabolic Panel: Recent Labs  Lab 09/10/21 2300 08/20/2021 0540 09/15/2021 0526 09/14/21 0457 09/15/21 0501  NA 132* 133* 134* 133*  133*  K 3.7 3.8 3.9 3.6 4.2  CL 100 100 105 105 105  CO2 '25 24 23 22 '$ 20*  GLUCOSE 251* 191* 158* 133* 141*  BUN '13 12 11 12 10  '$ CREATININE 0.46* 0.44* 0.46* 0.48* 0.40*  CALCIUM 8.8* 9.1 9.2 9.1 9.1  MG  --   --   --  1.6* 1.9    GFR: Estimated Creatinine Clearance: 77.9 mL/min (A) (by C-G formula based on SCr of 0.4 mg/dL (L)). Liver Function Tests: Recent Labs  Lab 09/10/21 2300 09/11/2021 1630 08/21/2021 0526 09/14/21 0457 09/15/21 0501  AST 65* 58* 38 27 47*  ALT 113* 113* 89* 62* 55*  ALKPHOS 103 103 98 84 111  BILITOT 0.6 0.9 0.8 0.7 1.2  PROT 6.0* 5.9* 5.5* 5.0* 5.3*  ALBUMIN 2.3* 2.3* 2.1* 1.9* 2.0*    No results for input(s): "LIPASE", "AMYLASE" in the last 168 hours. No results for input(s): "AMMONIA" in the last 168 hours. Coagulation Profile: Recent Labs  Lab 09/10/21 2300 08/27/2021 0526  INR 1.5* 1.4*    Cardiac  Enzymes: No results for input(s): "CKTOTAL", "CKMB", "CKMBINDEX", "TROPONINI" in the last 168 hours. BNP (last 3 results) No results for input(s): "PROBNP" in the last 8760 hours. HbA1C: No results for input(s): "HGBA1C" in the last 72 hours. CBG: Recent Labs  Lab 09/11/21 0311  GLUCAP 141*    Lipid Profile: No results for input(s): "CHOL", "HDL", "LDLCALC", "TRIG", "CHOLHDL", "LDLDIRECT" in the last 72 hours. Thyroid Function Tests: No results for input(s): "TSH", "T4TOTAL", "FREET4", "T3FREE", "THYROIDAB" in the last 72 hours. Anemia Panel: No results for input(s): "VITAMINB12", "FOLATE", "FERRITIN", "TIBC", "IRON", "RETICCTPCT" in the last 72 hours. Sepsis Labs: Recent Labs  Lab 09/11/21 0131 08/31/2021 0600 08/27/2021 1630 08/31/2021 2104  PROCALCITON  --   --  0.16  --   LATICACIDVEN 1.4 2.9* 2.3* 1.3     Recent Results (from the past 240 hour(s))  Culture, blood (Routine x 2)     Status: None (Preliminary result)   Collection Time: 09/10/21 11:00 PM   Specimen: BLOOD  Result Value Ref Range Status   Specimen Description   Final    BLOOD LEFT ANTECUBITAL Performed at [facility], [place] [address], [place], [place] [address]    Special Requests   Final    BOTTLES DRAWN AEROBIC AND ANAEROBIC Blood Culture adequate volume Performed at [facility] [address] [place], [place] [address]    Culture   Final    NO GROWTH 3 DAYS Performed at [facility], [place] [address] [place], [place] [address]    Report Status PENDING  Incomplete  Culture, blood (Routine x 2)     Status: None (Preliminary result)   Collection Time: 09/10/21 11:00 PM   Specimen: BLOOD  Result Value Ref Range Status   Specimen Description   Final    BLOOD LEFT ANTECUBITAL Performed at [facility] [address] [place], [place] [address]    Special Requests   Final    BOTTLES DRAWN AEROBIC AND ANAEROBIC Blood Culture adequate  volume Performed at [facility] [address] [place], [place] [address]    Culture   Final    NO GROWTH 3 DAYS Performed at [facility], [place] [address], [place],  [address]    Report Status PENDING  Incomplete  Blood culture (routine x 2)     Status: None (Preliminary result)   Collection Time:  08/25/2021  5:40 AM   Specimen: BLOOD  Result Value Ref Range Status   Specimen Description BLOOD RIGHT ANTECUBITAL  Final   Special Requests   Final    BOTTLES DRAWN AEROBIC AND ANAEROBIC Blood Culture results may not be optimal due to an excessive volume of blood received in culture bottles   Culture   Final    NO GROWTH 2 DAYS Performed at [facility] [place] [address] [place], [place] [address]    Report Status PENDING  Incomplete  Blood culture (routine x 2)     Status: None (Preliminary result)   Collection Time: 09/11/2021  6:00 AM   Specimen: BLOOD LEFT WRIST  Result Value Ref Range Status   Specimen Description   Final    BLOOD LEFT WRIST Performed at [facility] [address] [place], [place] [address]    Special Requests   Final    BOTTLES DRAWN AEROBIC AND ANAEROBIC Blood Culture results may not be optimal due to an excessive volume of blood received in culture bottles   Culture   Final    NO GROWTH 2 DAYS Performed at [facility], [place] [address], [place], [place] [address]    Report Status PENDING  Incomplete  Resp Panel by RT-PCR (Flu A&B, Covid) Anterior Nasal Swab     Status: None   Collection Time: 08/28/2021  8:01 AM   Specimen: Anterior Nasal Swab  Result Value Ref Range Status   SARS Coronavirus 2 by RT PCR NEGATIVE NEGATIVE Final    Comment: (NOTE) SARS-CoV-2 target nucleic acids are NOT DETECTED.  The SARS-CoV-2 RNA is generally detectable in upper respiratory specimens during the acute phase of infection. The lowest concentration of SARS-CoV-2 viral copies this assay can detect is 138  copies/mL. A negative result does not preclude SARS-Cov-2 infection and should not be used as the sole basis for treatment or other patient management decisions. A negative result may occur with  improper specimen collection/handling, submission of specimen other than nasopharyngeal swab, presence of viral mutation(s) within the areas targeted by this assay, and inadequate number of viral copies(<138 copies/mL). A negative result must be combined with clinical observations, patient history, and epidemiological information. The expected result is Negative.  Fact Sheet for Patients:  EntrepreneurPulse.com.au  Fact Sheet for Healthcare Providers:  IncredibleEmployment.be  This test is no t yet approved or cleared by the [place] FDA and  has been authorized for detection and/or diagnosis of SARS-CoV-2 by FDA under an Emergency Use Authorization (EUA). This EUA will remain  in effect (meaning this test can be used) for the duration of the COVID-19 declaration under Section 564(b)(1) of the Act, 21 U.S.C.section 360bbb-3(b)(1), unless the authorization is terminated  or revoked sooner.       Influenza A by PCR NEGATIVE NEGATIVE Final   Influenza B by PCR NEGATIVE NEGATIVE Final    Comment: (NOTE) The Xpert Xpress SARS-CoV-2/FLU/RSV plus assay is intended as an aid in the diagnosis of influenza from Nasopharyngeal swab specimens and should not be used as a sole basis for treatment. Nasal washings and aspirates are unacceptable for Xpert Xpress SARS-CoV-2/FLU/RSV testing.  Fact Sheet for Patients: EntrepreneurPulse.com.au  Fact Sheet for Healthcare Providers: IncredibleEmployment.be  This test is not yet approved or cleared by the [place] FDA and has been authorized for detection and/or diagnosis of SARS-CoV-2 by FDA under an Emergency Use Authorization (EUA). This EUA will remain in effect (meaning  this test can  be used) for the duration of the COVID-19 declaration under Section 564(b)(1) of the Act, 21 U.S.C. section 360bbb-3(b)(1), unless the authorization is terminated or revoked.  Performed at [facility], [place] [address], [place], [place] [address]          Radiology Studies: MR 3D Recon At Scanner  Result Date: 09/14/2021 CLINICAL DATA:  Follow-up pancreatic carcinoma. New liver lesions on recent CT. EXAM: MRI ABDOMEN WITHOUT AND WITH CONTRAST (INCLUDING MRCP) TECHNIQUE: Multiplanar multisequence MR imaging of the abdomen was performed both before and after the administration of intravenous contrast. Heavily T2-weighted images of the biliary and pancreatic ducts were obtained, and three-dimensional MRCP images were rendered by post processing. CONTRAST:  3m GADAVIST GADOBUTROL 1 MMOL/ML IV SOLN COMPARISON:  CT on 08/05/2021 FINDINGS: Lower chest: Tiny bilateral pleural effusions. Hepatobiliary: Innumerable rim enhancing masses are seen throughout the liver which are new and consistent with diffuse liver metastases. Gallbladder is unremarkable. No evidence of biliary ductal dilatation, with common bile duct stent remains in place. Pancreas: Soft tissue mass in the pancreatic head surrounding the common bile duct stent measures 5.9 x 5.0 cm, significantly increased in size since prior study. Diffuse pancreatic ductal dilatation is also noted. Spleen:  Within normal limits in size and appearance. Adrenals/Urinary Tract: No masses identified. No evidence of hydronephrosis. Stomach/Bowel: Unremarkable. Vascular/Lymphatic: No pathologically enlarged lymph nodes identified. No acute vascular findings. Other:  Mild-to-moderate ascites is new since previous study. Musculoskeletal:  No suspicious bone lesions identified. IMPRESSION: New diffuse liver metastases. Increased size of soft tissue mass in the pancreatic head surrounding the common bile duct stent, consistent with  primary pancreatic carcinoma. New mild-to-moderate ascites and tiny bilateral pleural effusions. Electronically Signed   By: JMarlaine HindM.D.   On: 09/14/2021 15:05   MR ABDOMEN MRCP W WO CONTAST  Result Date: 09/14/2021 CLINICAL DATA:  Follow-up pancreatic carcinoma. New liver lesions on recent CT. EXAM: MRI ABDOMEN WITHOUT AND WITH CONTRAST (INCLUDING MRCP) TECHNIQUE: Multiplanar multisequence MR imaging of the abdomen was performed both before and after the administration of intravenous contrast. Heavily T2-weighted images of the biliary and pancreatic ducts were obtained, and three-dimensional MRCP images were rendered by post processing. CONTRAST:  71mGADAVIST GADOBUTROL 1 MMOL/ML IV SOLN COMPARISON:  CT on 08/05/2021 FINDINGS: Lower chest: Tiny bilateral pleural effusions. Hepatobiliary: Innumerable rim enhancing masses are seen throughout the liver which are new and consistent with diffuse liver metastases. Gallbladder is unremarkable. No evidence of biliary ductal dilatation, with common bile duct stent remains in place. Pancreas: Soft tissue mass in the pancreatic head surrounding the common bile duct stent measures 5.9 x 5.0 cm, significantly increased in size since prior study. Diffuse pancreatic ductal dilatation is also noted. Spleen:  Within normal limits in size and appearance. Adrenals/Urinary Tract: No masses identified. No evidence of hydronephrosis. Stomach/Bowel: Unremarkable. Vascular/Lymphatic: No pathologically enlarged lymph nodes identified. No acute vascular findings. Other:  Mild-to-moderate ascites is new since previous study. Musculoskeletal:  No suspicious bone lesions identified. IMPRESSION: New diffuse liver metastases. Increased size of soft tissue mass in the pancreatic head surrounding the common bile duct stent, consistent with primary pancreatic carcinoma. New mild-to-moderate ascites and tiny bilateral pleural effusions. Electronically Signed   By: JoMarlaine Hind.D.   On:  09/14/2021 15:05        Scheduled Meds:  multivitamin with minerals  1 tablet Oral Daily   pantoprazole (PROTONIX) IV  40 mg Intravenous Q12H   saccharomyces boulardii  250 mg Oral  BID   Continuous Infusions:  sodium chloride 100 mL/hr at 09/15/21 0412   ceFEPime (MAXIPIME) IV 2 g (09/15/21 8288)   metronidazole 500 mg (09/15/21 0430)          Aline August, MD [facility] 09/15/2021, 7:43 AM

## 2021-09-15 NOTE — TOC Progression Note (Signed)
Transition of Care (TOC) - Progression Note    Patient Details  Name: Casey Pierce. MRN: [id number] Date of Birth: Dec 02, 1955  Transition of Care [facility]) CM/SW Contact  Leeroy Cha, RN Phone Number: 09/15/2021, 8:44 AM  Clinical Narrative:    [phone] chart reviewed.  Following for toc needs.   Expected Discharge Plan: Home/Self Care Barriers to Discharge: Continued Medical Work up  Expected Discharge Plan and Services Expected Discharge Plan: Home/Self Care   Discharge Planning Services: CM Consult   Living arrangements for the past 2 months: Single Family Home                                       Social Determinants of Health (SDOH) Interventions    Readmission Risk Interventions    09/14/2021   12:07 PM  Readmission Risk Prevention Plan  Transportation Screening Complete  PCP or Specialist Appt within 3-5 Days Complete  HRI or [place] Complete  Social Work Consult for Trail Planning/Counseling Complete  Palliative Care Screening Not Applicable  Medication Review [employer]) Complete

## 2021-09-16 ENCOUNTER — Other Ambulatory Visit (HOSPITAL_COMMUNITY): Payer: Medicare PPO

## 2021-09-16 DIAGNOSIS — E440 Moderate protein-calorie malnutrition: Secondary | ICD-10-CM

## 2021-09-16 DIAGNOSIS — K652 Spontaneous bacterial peritonitis: Secondary | ICD-10-CM

## 2021-09-16 DIAGNOSIS — R509 Fever, unspecified: Secondary | ICD-10-CM | POA: Diagnosis not present

## 2021-09-16 DIAGNOSIS — A419 Sepsis, unspecified organism: Secondary | ICD-10-CM | POA: Diagnosis not present

## 2021-09-16 DIAGNOSIS — D638 Anemia in other chronic diseases classified elsewhere: Secondary | ICD-10-CM | POA: Diagnosis not present

## 2021-09-16 DIAGNOSIS — K2980 Duodenitis without bleeding: Secondary | ICD-10-CM | POA: Diagnosis not present

## 2021-09-16 LAB — CBC WITH DIFFERENTIAL/PLATELET
Abs Immature Granulocytes: 0.18 10*3/uL — ABNORMAL HIGH (ref 0.00–0.07)
Basophils Absolute: 0 10*3/uL (ref 0.0–0.1)
Basophils Relative: 0 %
Eosinophils Absolute: 0 10*3/uL (ref 0.0–0.5)
Eosinophils Relative: 0 %
HCT: 27 % — ABNORMAL LOW (ref 39.0–52.0)
Hemoglobin: 9 g/dL — ABNORMAL LOW (ref 13.0–17.0)
Immature Granulocytes: 1 %
Lymphocytes Relative: 5 %
Lymphs Abs: 1 10*3/uL (ref 0.7–4.0)
MCH: 32.6 pg (ref 26.0–34.0)
MCHC: 33.3 g/dL (ref 30.0–36.0)
MCV: 97.8 fL (ref 80.0–100.0)
Monocytes Absolute: 1.1 10*3/uL — ABNORMAL HIGH (ref 0.1–1.0)
Monocytes Relative: 5 %
Neutro Abs: 19.1 10*3/uL — ABNORMAL HIGH (ref 1.7–7.7)
Neutrophils Relative %: 89 %
Platelets: 107 10*3/uL — ABNORMAL LOW (ref 150–400)
RBC: 2.76 MIL/uL — ABNORMAL LOW (ref 4.22–5.81)
RDW: 16.2 % — ABNORMAL HIGH (ref 11.5–15.5)
WBC: 21.4 10*3/uL — ABNORMAL HIGH (ref 4.0–10.5)
nRBC: 0 % (ref 0.0–0.2)

## 2021-09-16 LAB — COMPREHENSIVE METABOLIC PANEL
ALT: 43 U/L (ref 0–44)
AST: 24 U/L (ref 15–41)
Albumin: 1.9 g/dL — ABNORMAL LOW (ref 3.5–5.0)
Alkaline Phosphatase: 110 U/L (ref 38–126)
Anion gap: 6 (ref 5–15)
BUN: 11 mg/dL (ref 8–23)
CO2: 22 mmol/L (ref 22–32)
Calcium: 9.2 mg/dL (ref 8.9–10.3)
Chloride: 102 mmol/L (ref 98–111)
Creatinine, Ser: 0.37 mg/dL — ABNORMAL LOW (ref 0.61–1.24)
GFR, Estimated: >60 mL/min (ref >60)
Glucose, Bld: 154 mg/dL — ABNORMAL HIGH (ref 70–99)
Potassium: 3.2 mmol/L — ABNORMAL LOW (ref 3.5–5.1)
Sodium: 130 mmol/L — ABNORMAL LOW (ref 135–145)
Total Bilirubin: 0.9 mg/dL (ref 0.3–1.2)
Total Protein: 5.2 g/dL — ABNORMAL LOW (ref 6.5–8.1)

## 2021-09-16 LAB — CULTURE, BLOOD (ROUTINE X 2)
Culture: NO GROWTH
Culture: NO GROWTH
Special Requests: ADEQUATE
Special Requests: ADEQUATE

## 2021-09-16 LAB — MAGNESIUM: Magnesium: 1.8 mg/dL (ref 1.7–2.4)

## 2021-09-16 MED ORDER — POTASSIUM CHLORIDE CRYS ER 20 MEQ PO TBCR
40.0000 meq | EXTENDED_RELEASE_TABLET | ORAL | Status: AC
  Administered 2021-09-16 (×2): 40 meq via ORAL
  Filled 2021-09-16 (×2): qty 2

## 2021-09-16 NOTE — Progress Notes (Signed)
PROGRESS NOTE    Casey Pierce.  SWF:[id number] DOB: Nov 13, 1955 DOA: 08/17/2021 PCP: Lavone Orn, MD   Brief Narrative:  66 y.o. male with medical history significant of CAD s/p stents, CVA, permanent A-fib, pancreatic CA recently completed chemotherapy  presented with fever, abdominal pain, fatigue, poor appetite and nausea with melena.  On presentation, he had a temperature of 100.6, was tachycardic.  WBC of 16.3, lactic acid 2.9, influenza and COVID-19 test negative.  CT abdomen/chest/pelvis showed new masslike area of the pancreatic head concerning for progressive disease, metastatic disease, new concerns for peritoneal carcinomatosis, duodenitis.  GI and oncology were consulted.  He underwent EGD on 08/29/2021.  Assessment & Plan:   Sepsis: Present on admission Possible SBP -Presented with fever, leukocytosis, tachycardia, lactic acidosis -No source of sepsis identified yet.  Unclear if this is secondary to cancer as well.  Procalcitonin 0.16 on presentation.  UA unremarkable. CT abdomen/chest/pelvis showed new masslike area of the pancreatic head concerning for progressive disease, metastatic disease, new concerns for peritoneal carcinomatosis, duodenitis with some gallbladder thickening, possibly reactive -currently on cefepime and Flagyl.  No evidence of MRSA infection.  Off vancomycin. -Cultures negative so far.  COVID-19 and influenza negative on presentation. -No temperature spikes over the last 3 days. -Peritoneal fluid analysis on 09/15/2021 shows WBCs of 586 with 86% neutrophils.  Continue current IV antibiotics.  Follow cultures.  Possible upper GI bleeding Possible duodenitis -Reported melanotic stools.  FOBT negative.  Status post 1 unit packed red cell transfusion on 09/14/2021 for hemoglobin of 7.3.  Hemoglobin 9 today.  Monitor H&H. -Eliquis on hold. -Continue IV Protonix twice daily. -Status post EGD on 08/17/2021 which showed severe inflammation with contact mucosal  oozing/hemorrhagic inflammation in the duodenum including duodenal bulb, first and second portion of the duodenum along with mild gastritis and esophagitis.  GI recommends to hold Eliquis for at least 1 week and to continue Protonix 40 mg twice a day indefinitely.  GI signed off on 09/14/2021  Lactic acidosis -From infection and dehydration.  Improved.  Leukocytosis -Worsening today to 21.4.  We will continue IV antibiotics for another day.  Repeat a.m. labs.  Hyponatremia -Mild.  Off IV fluids.  Encourage oral intake. -monitor  Anemia of chronic disease -Plan as above.  Transfuse if hemoglobin is less than 7.5 as per oncology.  Permanent A-fib -Currently rate controlled.  Eliquis on hold.  Not on any rate controlling medication.  Pancreatic cancer with possible metastases with mild to moderate ascites Mildly elevated LFTs -CT with possible new progression. -Oncology following.  MRI of the abdomen shows diffuse liver metastasis with new mild to moderate ascites and increased size of soft tissue mass in the pancreatic head consistent with primary pancreatic carcinoma.   -Whipple's surgery planned for next week has been canceled -Status post liver biopsy and paracentesis and removal of 100 cc of fluid by IR on 09/15/2021.  Patient possibly has SBP: Discussion as above  Thrombocytopenia -Possibly from cancer.  No signs of bleeding.  Monitor.  Moderate malnutrition -Follow nutrition recommendations    DVT prophylaxis: SCDs Code Status: Full Family Communication: Wife and daughter at bedside Disposition Plan: Status is: Inpatient Remains inpatient appropriate because: Possible discharge in 24 to 48 hours if remains afebrile and hemodynamically stable and WBCs improved.    Consultants: GI/oncology/IR  Procedures: EGD on 09/11/2021 Liver biopsy and paracentesis on 09/15/2021 Antimicrobials:  Anti-infectives (From admission, onward)    Start     Dose/Rate Route Frequency Ordered  Stop   08/17/2021 2200  ceFEPIme (MAXIPIME) 2 g in sodium chloride 0.9 % 100 mL IVPB        2 g 200 mL/hr over 30 Minutes Intravenous Every 8 hours 09/02/2021 1555     09/04/2021 1700  vancomycin (VANCOREADY) IVPB 1750 mg/350 mL  Status:  Discontinued        1,750 mg 175 mL/hr over 120 Minutes Intravenous Every 24 hours 08/26/2021 1557 09/14/21 0839   08/30/2021 1600  ceFEPIme (MAXIPIME) 2 g in sodium chloride 0.9 % 100 mL IVPB  Status:  Discontinued        2 g 200 mL/hr over 30 Minutes Intravenous  Once 08/22/2021 1550 09/05/2021 1554   08/18/2021 1600  metroNIDAZOLE (FLAGYL) IVPB 500 mg        500 mg 100 mL/hr over 60 Minutes Intravenous Every 12 hours 09/07/2021 1550 2021/09/22 1559   09/11/2021 1600  vancomycin (VANCOCIN) IVPB 1000 mg/200 mL premix  Status:  Discontinued        1,000 mg 200 mL/hr over 60 Minutes Intravenous  Once 08/20/2021 1550 08/31/2021 1555   08/19/2021 1600  vancomycin (VANCOREADY) IVPB 1500 mg/300 mL  Status:  Discontinued        1,500 mg 150 mL/hr over 120 Minutes Intravenous  Once 09/07/2021 1555 08/24/2021 1556   08/20/2021 1515  ceFEPIme (MAXIPIME) 2 g in sodium chloride 0.9 % 100 mL IVPB        2 g 200 mL/hr over 30 Minutes Intravenous  Once 08/31/2021 1506 08/25/2021 1659        Subjective: Patient seen and examined at bedside.  Complained of some abdominal pain after walking with PT.  Denies worsening shortness of breath, cough, worsening abdominal pain.  Had a bowel movement this morning.   Objective: Vitals:   09/15/21 1502 09/15/21 2042 09/16/21 0328 09/16/21 0417  BP: 123/77 116/76 134/77   Pulse: 82 98 95   Resp: 19 16    Temp:  98.6 F (37 C) 99.8 F (37.7 C)   TempSrc:  Oral    SpO2: 100% 97% 97%   Weight:    72.6 kg  Height:        Intake/Output Summary (Last 24 hours) at 09/16/2021 1105 Last data filed at 09/16/2021 0926 Gross per 24 hour  Intake 1314.05 ml  Output 200 ml  Net 1114.05 ml    Filed Weights   08/18/2021 1209 09/14/21 1528 09/16/21 0417  Weight: 72.6  kg 66.1 kg 72.6 kg    Examination:  General: No distress.  Still on room air.  Looks chronically ill and deconditioned. ENT/neck: JVD is not elevated and there is no neck masses palpable  respiratory: Decreased breath sounds at bases bilaterally with some crackles, no wheezing heard CVS: S1 and S2 heard; rate controlled currently  abdominal: Soft, nontender, still slightly distended; no organomegaly, normal bowel sounds are heard  extremities: Trace lower extremity edema present bilaterally; no cyanosis  CNS: Awake and still slow to respond.  No focal neurologic deficit.  Able to move extremities.   Lymph: No cervical lymphadenopathy palpable  skin: No obvious lesions/ecchymosis  psych: Showing no signs of agitation.  Flat affect mostly.   Musculoskeletal: No obvious other joint swelling/deformity     Data Reviewed: I have personally reviewed following labs and imaging studies  CBC: Recent Labs  Lab 09/10/21 2300 08/28/2021 0540 08/28/2021 0526 09/14/21 0457 09/14/21 2219 09/15/21 0501 09/16/21 0537  WBC 12.8* 16.3* 15.1* 14.1*  --  17.1* 21.4*  NEUTROABS 10.7* 14.5*  --  12.3*  --  15.2* 19.1*  HGB 8.7* 8.8* 7.8* 7.3* 9.4* 9.7* 9.0*  HCT 26.4* 26.3* 23.9* 22.9* 27.9* 28.5* 27.0*  MCV 99.2 99.2 100.4* 100.4*  --  96.9 97.8  PLT 114* 127* 112* 104*  --  74* 107*    Basic Metabolic Panel: Recent Labs  Lab 08/27/2021 0540 09/07/2021 0526 09/14/21 0457 09/15/21 0501 09/16/21 0537  NA 133* 134* 133* 133* 130*  K 3.8 3.9 3.6 4.2 3.2*  CL 100 105 105 105 102  CO2 '24 23 22 '$ 20* 22  GLUCOSE 191* 158* 133* 141* 154*  BUN '12 11 12 10 11  '$ CREATININE 0.44* 0.46* 0.48* 0.40* 0.37*  CALCIUM 9.1 9.2 9.1 9.1 9.2  MG  --   --  1.6* 1.9 1.8    GFR: Estimated Creatinine Clearance: 77.9 mL/min (A) (by C-G formula based on SCr of 0.37 mg/dL (L)). Liver Function Tests: Recent Labs  Lab 08/17/2021 1630 08/27/2021 0526 09/14/21 0457 09/15/21 0501 09/16/21 0537  AST 58* 38 27 47* 24   ALT 113* 89* 62* 55* 43  ALKPHOS 103 98 84 111 110  BILITOT 0.9 0.8 0.7 1.2 0.9  PROT 5.9* 5.5* 5.0* 5.3* 5.2*  ALBUMIN 2.3* 2.1* 1.9* 2.0* 1.9*    No results for input(s): "LIPASE", "AMYLASE" in the last 168 hours. No results for input(s): "AMMONIA" in the last 168 hours. Coagulation Profile: Recent Labs  Lab 09/10/21 2300 09/05/2021 0526  INR 1.5* 1.4*    Cardiac Enzymes: No results for input(s): "CKTOTAL", "CKMB", "CKMBINDEX", "TROPONINI" in the last 168 hours. BNP (last 3 results) No results for input(s): "PROBNP" in the last 8760 hours. HbA1C: No results for input(s): "HGBA1C" in the last 72 hours. CBG: Recent Labs  Lab 09/11/21 0311  GLUCAP 141*    Lipid Profile: No results for input(s): "CHOL", "HDL", "LDLCALC", "TRIG", "CHOLHDL", "LDLDIRECT" in the last 72 hours. Thyroid Function Tests: No results for input(s): "TSH", "T4TOTAL", "FREET4", "T3FREE", "THYROIDAB" in the last 72 hours. Anemia Panel: No results for input(s): "VITAMINB12", "FOLATE", "FERRITIN", "TIBC", "IRON", "RETICCTPCT" in the last 72 hours. Sepsis Labs: Recent Labs  Lab 09/11/21 0131 09/05/2021 0600 09/11/2021 1630 09/14/2021 2104  PROCALCITON  --   --  0.16  --   LATICACIDVEN 1.4 2.9* 2.3* 1.3     Recent Results (from the past 240 hour(s))  Culture, blood (Routine x 2)     Status: None   Collection Time: 09/10/21 11:00 PM   Specimen: BLOOD  Result Value Ref Range Status   Specimen Description   Final    BLOOD LEFT ANTECUBITAL Performed at [facility], [place] [address] [place], [place] [address]    Special Requests   Final    BOTTLES DRAWN AEROBIC AND ANAEROBIC Blood Culture adequate volume Performed at [place] [address] [place], [place] [address]    Culture   Final    NO GROWTH 5 DAYS Performed at [facility], [place] [address], [place], [place] [address]    Report Status 09/16/2021 FINAL  Final  Culture, blood (Routine x 2)      Status: None   Collection Time: 09/10/21 11:00 PM   Specimen: BLOOD  Result Value Ref Range Status   Specimen Description   Final    BLOOD LEFT ANTECUBITAL Performed at [place] [address], [place], [place] [address]    Special Requests   Final    BOTTLES DRAWN  AEROBIC AND ANAEROBIC Blood Culture adequate volume Performed at [place] [address], [place], [place] [address]    Culture   Final    NO GROWTH 5 DAYS Performed at [facility], [place] [address] [place], [place] [address]    Report Status 09/16/2021 FINAL  Final  Blood culture (routine x 2)     Status: None (Preliminary result)   Collection Time: 08/22/2021  5:40 AM   Specimen: BLOOD  Result Value Ref Range Status   Specimen Description BLOOD RIGHT ANTECUBITAL  Final   Special Requests   Final    BOTTLES DRAWN AEROBIC AND ANAEROBIC Blood Culture results may not be optimal due to an excessive volume of blood received in culture bottles   Culture   Final    NO GROWTH 4 DAYS Performed at [facility], [place] [address], [place], [place] [address]    Report Status PENDING  Incomplete  Blood culture (routine x 2)     Status: None (Preliminary result)   Collection Time: 08/19/2021  6:00 AM   Specimen: BLOOD LEFT WRIST  Result Value Ref Range Status   Specimen Description   Final    BLOOD LEFT WRIST Performed at [place] [address] [place], [place] [address]    Special Requests   Final    BOTTLES DRAWN AEROBIC AND ANAEROBIC Blood Culture results may not be optimal due to an excessive volume of blood received in culture bottles   Culture   Final    NO GROWTH 4 DAYS Performed at [facility], [place] [address] [place], [place] [address]    Report Status PENDING  Incomplete  Resp Panel by RT-PCR (Flu A&B, Covid) Anterior Nasal Swab     Status: None   Collection Time: 08/22/2021  8:01 AM   Specimen: Anterior Nasal Swab  Result Value  Ref Range Status   SARS Coronavirus 2 by RT PCR NEGATIVE NEGATIVE Final    Comment: (NOTE) SARS-CoV-2 target nucleic acids are NOT DETECTED.  The SARS-CoV-2 RNA is generally detectable in upper respiratory specimens during the acute phase of infection. The lowest concentration of SARS-CoV-2 viral copies this assay can detect is 138 copies/mL. A negative result does not preclude SARS-Cov-2 infection and should not be used as the sole basis for treatment or other patient management decisions. A negative result may occur with  improper specimen collection/handling, submission of specimen other than nasopharyngeal swab, presence of viral mutation(s) within the areas targeted by this assay, and inadequate number of viral copies(<138 copies/mL). A negative result must be combined with clinical observations, patient history, and epidemiological information. The expected result is Negative.  Fact Sheet for Patients:  EntrepreneurPulse.com.au  Fact Sheet for Healthcare Providers:  IncredibleEmployment.be  This test is no t yet approved or cleared by the [place] FDA and  has been authorized for detection and/or diagnosis of SARS-CoV-2 by FDA under an Emergency Use Authorization (EUA). This EUA will remain  in effect (meaning this test can be used) for the duration of the COVID-19 declaration under Section 564(b)(1) of the Act, 21 U.S.C.section 360bbb-3(b)(1), unless the authorization is terminated  or revoked sooner.       Influenza A by PCR NEGATIVE NEGATIVE Final   Influenza B by PCR NEGATIVE NEGATIVE Final    Comment: (NOTE) The Xpert Xpress SARS-CoV-2/FLU/RSV plus assay is intended as an aid in the diagnosis of influenza from Nasopharyngeal swab specimens and should not be  used as a sole basis for treatment. Nasal washings and aspirates are unacceptable for Xpert Xpress SARS-CoV-2/FLU/RSV testing.  Fact Sheet for  Patients: EntrepreneurPulse.com.au  Fact Sheet for Healthcare Providers: IncredibleEmployment.be  This test is not yet approved or cleared by the [place] FDA and has been authorized for detection and/or diagnosis of SARS-CoV-2 by FDA under an Emergency Use Authorization (EUA). This EUA will remain in effect (meaning this test can be used) for the duration of the COVID-19 declaration under Section 564(b)(1) of the Act, 21 U.S.C. section 360bbb-3(b)(1), unless the authorization is terminated or revoked.  Performed at [facility], [place] [address] [place], [place] [address]   Culture, body fluid w Gram Stain-bottle     Status: None (Preliminary result)   Collection Time: 09/15/21  9:19 PM   Specimen: Peritoneal Washings  Result Value Ref Range Status   Specimen Description PERITONEAL  Final   Special Requests NONE  Final   Culture   Final    NO GROWTH < 12 HOURS Performed at [facility], [place] [address], [place], [place] [address]    Report Status PENDING  Incomplete  Gram stain     Status: None   Collection Time: 09/15/21  9:19 PM   Specimen: Peritoneal Washings  Result Value Ref Range Status   Specimen Description PERITONEAL  Final   Special Requests NONE  Final   Gram Stain   Final    MODERATE WBC PRESENT, PREDOMINANTLY PMN NO ORGANISMS SEEN Performed at [facility], [place] [address], [place], [place] [address]    Report Status 09/15/2021 FINAL  Final         Radiology Studies: US Paracentesis  Result Date: 09/15/2021 INDICATION: 67 year old male with history of pancreatic cancer and presumed multifocal hepatic metastases with small volume ascites. EXAM: ULTRASOUND GUIDED  PARACENTESIS MEDICATIONS: None. COMPLICATIONS: None immediate. PROCEDURE: Informed written consent was obtained from the patient after a discussion of the risks, benefits and alternatives to treatment. A timeout was performed  prior to the initiation of the procedure. Initial ultrasound scanning demonstrates a small amount of ascites within the right lower abdominal quadrant. The right lower abdomen was prepped and draped in the usual sterile fashion. 1% lidocaine was used for local anesthesia. Following this, a 19 gauge, 7-cm, Yueh catheter was introduced. An ultrasound image was saved for documentation purposes. The paracentesis was performed. The catheter was removed and a dressing was applied. The patient tolerated the procedure well without immediate post procedural complication. FINDINGS: A total of approximately 100 mL of translucent, straw-colored fluid was removed. Samples were sent to the laboratory as requested by the clinical team. IMPRESSION: Successful ultrasound-guided paracentesis yielding 100 mL of peritoneal fluid. Ruthann Cancer, MD [facility] Radiology Electronically Signed   By: Ruthann Cancer M.D.   On: 09/15/2021 15:44   US BIOPSY (LIVER)  Result Date: 09/15/2021 INDICATION: 66 year old male with history of pancreatic cancer presenting with multifocal hepatic masses of indeterminate etiology. EXAM: ULTRASOUND GUIDED LIVER LESION BIOPSY COMPARISON:  None Available. MEDICATIONS: None ANESTHESIA/SEDATION: Fentanyl 50 mcg IV; Versed 2 mg IV Total Moderate Sedation time:  12 minutes. The patient's level of consciousness and vital signs were monitored continuously by radiology nursing throughout the procedure under my direct supervision. COMPLICATIONS: None immediate. PROCEDURE: Informed written consent was obtained from the patient after a discussion of the risks, benefits and alternatives to treatment. The patient understands and consents the procedure. A timeout was performed prior  to the initiation of the procedure. Ultrasound scanning was performed of the right upper abdominal quadrant demonstrates multifocal subcapsular hypoechoic masses in the anterior left lobe of  the liver. A prominent anterior left lobe mass was selected for biopsy and the procedure was planned. The right upper abdominal quadrant was prepped and draped in the usual sterile fashion. The overlying soft tissues were anesthetized with 1% lidocaine with epinephrine. A 17 gauge, 6.8 cm co-axial needle was advanced into a peripheral aspect of the lesion. This was followed by total of 3 core biopsies with an 18 gauge core device under direct ultrasound guidance. The coaxial needle tract was embolized with a small amount of Gel-Foam slurry and superficial hemostasis was obtained with manual compression. Post procedural scanning was negative for definitive area of hemorrhage or additional complication. A dressing was placed. The patient tolerated the procedure well without immediate post procedural complication. IMPRESSION: Technically successful ultrasound guided core needle biopsy of anterior left lobe liver mass. Ruthann Cancer, MD [name] Radiology Electronically Signed   By: Ruthann Cancer M.D.   On: 09/15/2021 15:42   MR 3D Recon At Scanner  Result Date: 09/14/2021 CLINICAL DATA:  Follow-up pancreatic carcinoma. New liver lesions on recent CT. EXAM: MRI ABDOMEN WITHOUT AND WITH CONTRAST (INCLUDING MRCP) TECHNIQUE: Multiplanar multisequence MR imaging of the abdomen was performed both before and after the administration of intravenous contrast. Heavily T2-weighted images of the biliary and pancreatic ducts were obtained, and three-dimensional MRCP images were rendered by post processing. CONTRAST:  65m GADAVIST GADOBUTROL 1 MMOL/ML IV SOLN COMPARISON:  CT on 08/05/2021 FINDINGS: Lower chest: Tiny bilateral pleural effusions. Hepatobiliary: Innumerable rim enhancing masses are seen throughout the liver which are new and consistent with diffuse liver metastases. Gallbladder is unremarkable. No evidence of biliary ductal dilatation, with common bile duct stent  remains in place. Pancreas: Soft tissue mass in the pancreatic head surrounding the common bile duct stent measures 5.9 x 5.0 cm, significantly increased in size since prior study. Diffuse pancreatic ductal dilatation is also noted. Spleen:  Within normal limits in size and appearance. Adrenals/Urinary Tract: No masses identified. No evidence of hydronephrosis. Stomach/Bowel: Unremarkable. Vascular/Lymphatic: No pathologically enlarged lymph nodes identified. No acute vascular findings. Other:  Mild-to-moderate ascites is new since previous study. Musculoskeletal:  No suspicious bone lesions identified. IMPRESSION: New diffuse liver metastases. Increased size of soft tissue mass in the pancreatic head surrounding the common bile duct stent, consistent with primary pancreatic carcinoma. New mild-to-moderate ascites and tiny bilateral pleural effusions. Electronically Signed   By: JMarlaine HindM.D.   On: 09/14/2021 15:05   MR ABDOMEN MRCP W WO CONTAST  Result Date: 09/14/2021 CLINICAL DATA:  Follow-up pancreatic carcinoma. New liver lesions on recent CT. EXAM: MRI ABDOMEN WITHOUT AND WITH CONTRAST (INCLUDING MRCP) TECHNIQUE: Multiplanar multisequence MR imaging of the abdomen was performed both before and after the administration of intravenous contrast. Heavily T2-weighted images of the biliary and pancreatic ducts were obtained, and three-dimensional MRCP images were rendered by post processing. CONTRAST:  729mGADAVIST GADOBUTROL 1 MMOL/ML IV SOLN COMPARISON:  CT on 08/05/2021 FINDINGS: Lower chest: Tiny bilateral pleural effusions. Hepatobiliary: Innumerable rim enhancing masses are seen throughout the liver which are new and consistent with diffuse liver metastases. Gallbladder is unremarkable. No evidence of biliary ductal dilatation, with common bile duct stent remains in place. Pancreas: Soft tissue mass in the pancreatic head surrounding the common bile duct stent measures 5.9 x 5.0 cm, significantly  increased  in size since prior study. Diffuse pancreatic ductal dilatation is also noted. Spleen:  Within normal limits in size and appearance. Adrenals/Urinary Tract: No masses identified. No evidence of hydronephrosis. Stomach/Bowel: Unremarkable. Vascular/Lymphatic: No pathologically enlarged lymph nodes identified. No acute vascular findings. Other:  Mild-to-moderate ascites is new since previous study. Musculoskeletal:  No suspicious bone lesions identified. IMPRESSION: New diffuse liver metastases. Increased size of soft tissue mass in the pancreatic head surrounding the common bile duct stent, consistent with primary pancreatic carcinoma. New mild-to-moderate ascites and tiny bilateral pleural effusions. Electronically Signed   By: Marlaine Hind M.D.   On: 09/14/2021 15:05        Scheduled Meds:  multivitamin with minerals  1 tablet Oral Daily   pantoprazole (PROTONIX) IV  40 mg Intravenous Q12H   potassium chloride  40 mEq Oral Q4H   saccharomyces boulardii  250 mg Oral BID   Continuous Infusions:  ceFEPime (MAXIPIME) IV 2 g (09/16/21 0556)   metronidazole Stopped (09/16/21 0517)          Aline August, MD [facility] 09/16/2021, 11:05 AM

## 2021-09-16 NOTE — Evaluation (Signed)
Physical Therapy Evaluation Patient Details Name: Casey Pierce. MRN: [id number] DOB: [birth year] Today's Date: 09/16/2021  History of Present Illness  66 yo male admitted with sepsis, ascites, new liver mets. S/P liver biopsy/paracentesis. Hx of Afib, CAD, MI, pancreatic Ca, CVA  Clinical Impression  On eval, pt was Min guard-Min A for mobility. He walked a total of ~250 feet (with 1 seated rest break taken 2* HR up to 152 bpm). Discussed d/c plan with pt and family-they are open to HHPT f/u. Will plan to follow pt during this hospital stay.        Recommendations for follow up therapy are one component of a multi-disciplinary discharge planning process, led by the attending physician.  Recommendations may be updated based on patient status, additional functional criteria and insurance authorization.  Follow Up Recommendations Home health PT      Assistance Recommended at Discharge Intermittent Supervision/Assistance  Patient can return home with the following  A little help with walking and/or transfers;A little help with bathing/dressing/bathroom;Assist for transportation;Help with stairs or ramp for entrance    Equipment Recommendations None recommended by PT  Recommendations for Other Services       Functional Status Assessment Patient has had a recent decline in their functional status and demonstrates the ability to make significant improvements in function in a reasonable and predictable amount of time.     Precautions / Restrictions Precautions Precautions: Fall Restrictions Weight Bearing Restrictions: No      Mobility  Bed Mobility Overal bed mobility: Needs Assistance Bed Mobility: Rolling, Sidelying to Sit, Sit to Supine Rolling: Supervision Sidelying to sit: Supervision   Sit to supine: Supervision   General bed mobility comments: Increased time. Cues provided. Used bedrail for supine to sit    Transfers Overall transfer level: Needs assistance    Transfers: Sit to/from Stand Sit to Stand: Min guard           General transfer comment: Min guard for safety.    Ambulation/Gait Ambulation/Gait assistance: Min assist, Min guard Gait Distance (Feet): 150 Feet (150'x1; 100'x1) Assistive device: None Gait Pattern/deviations: Decreased stride length       General Gait Details: Intermittent assist to steady but no overt LOB. Seated rest break taken after ~150 feet 2* increased HR (up to 152 bpm). After resting, pt was able to continue walking back to his room. O2 >90% on RA  Stairs            Wheelchair Mobility    Modified Rankin (Stroke Patients Only)       Balance Overall balance assessment: Mild deficits observed, not formally tested                                           Pertinent Vitals/Pain Pain Assessment Pain Assessment: No/denies pain    Home Living Family/patient expects to be discharged to:: Private residence Living Arrangements: Spouse/significant other Available Help at Discharge: Family Type of Home: House Home Access: Level entry     Alternate Level Stairs-Number of Steps: 3 down to den? Home Layout: Multi-level Home Equipment: None      Prior Function Prior Level of Function : Independent/Modified Independent                     Hand Dominance        Extremity/Trunk Assessment   Upper Extremity Assessment Upper  Extremity Assessment: Generalized weakness    Lower Extremity Assessment Lower Extremity Assessment: Generalized weakness    Cervical / Trunk Assessment Cervical / Trunk Assessment: Normal  Communication   Communication: No difficulties  Cognition Arousal/Alertness: Awake/alert Behavior During Therapy: WFL for tasks assessed/performed Overall Cognitive Status: Within Functional Limits for tasks assessed                                          General Comments      Exercises     Assessment/Plan    PT  Assessment Patient needs continued PT services  PT Problem List Decreased strength;Decreased balance;Decreased activity tolerance;Decreased mobility       PT Treatment Interventions DME instruction;Gait training;Functional mobility training;Therapeutic activities;Balance training;Therapeutic exercise;Patient/family education    PT Goals (Current goals can be found in the Care Plan section)  Acute Rehab PT Goals Patient Stated Goal: home soon; regain strength PT Goal Formulation: With patient/family Time For Goal Achievement: 09/30/21 Potential to Achieve Goals: Good    Frequency Min 3X/week     Co-evaluation               AM-PAC PT "6 Clicks" Mobility  Outcome Measure Help needed turning from your back to your side while in a flat bed without using bedrails?: A Little Help needed moving from lying on your back to sitting on the side of a flat bed without using bedrails?: A Little Help needed moving to and from a bed to a chair (including a wheelchair)?: A Little Help needed standing up from a chair using your arms (e.g., wheelchair or bedside chair)?: A Little Help needed to walk in hospital room?: A Little Help needed climbing 3-5 steps with a railing? : A Little 6 Click Score: 18    End of Session Equipment Utilized During Treatment: Gait belt Activity Tolerance: Patient tolerated treatment well Patient left: in bed;with call bell/phone within reach;with family/visitor present   PT Visit Diagnosis: Muscle weakness (generalized) (M62.81);Unsteadiness on feet (R26.81)    Time: 0953-1006 PT Time Calculation (min) (ACUTE ONLY): 13 min   Charges:   PT Evaluation $PT Eval Moderate Complexity: 1 Mod             Doreatha Massed, PT Acute Rehabilitation  Office: [phone] Pager: [phone]

## 2021-09-16 DEATH — deceased

## 2021-09-17 ENCOUNTER — Inpatient Hospital Stay (HOSPITAL_COMMUNITY): Payer: Medicare PPO

## 2021-09-17 ENCOUNTER — Other Ambulatory Visit: Payer: Self-pay

## 2021-09-17 ENCOUNTER — Inpatient Hospital Stay (HOSPITAL_COMMUNITY): Payer: Medicare PPO | Admitting: Certified Registered"

## 2021-09-17 ENCOUNTER — Encounter (HOSPITAL_COMMUNITY): Admission: EM | Disposition: E | Payer: Self-pay | Source: Home / Self Care | Attending: Internal Medicine

## 2021-09-17 DIAGNOSIS — I2510 Atherosclerotic heart disease of native coronary artery without angina pectoris: Secondary | ICD-10-CM | POA: Diagnosis not present

## 2021-09-17 DIAGNOSIS — I6389 Other cerebral infarction: Secondary | ICD-10-CM | POA: Diagnosis not present

## 2021-09-17 DIAGNOSIS — D696 Thrombocytopenia, unspecified: Secondary | ICD-10-CM

## 2021-09-17 DIAGNOSIS — I252 Old myocardial infarction: Secondary | ICD-10-CM | POA: Diagnosis not present

## 2021-09-17 DIAGNOSIS — I6602 Occlusion and stenosis of left middle cerebral artery: Secondary | ICD-10-CM | POA: Diagnosis not present

## 2021-09-17 DIAGNOSIS — I639 Cerebral infarction, unspecified: Secondary | ICD-10-CM

## 2021-09-17 DIAGNOSIS — A419 Sepsis, unspecified organism: Secondary | ICD-10-CM | POA: Diagnosis not present

## 2021-09-17 HISTORY — PX: IR US GUIDE VASC ACCESS RIGHT: IMG2390

## 2021-09-17 HISTORY — PX: IR PERCUTANEOUS ART THROMBECTOMY/INFUSION INTRACRANIAL INC DIAG ANGIO: IMG6087

## 2021-09-17 HISTORY — PX: IR CT HEAD LTD: IMG2386

## 2021-09-17 HISTORY — PX: RADIOLOGY WITH ANESTHESIA: SHX6223

## 2021-09-17 LAB — CBC WITH DIFFERENTIAL/PLATELET
Abs Immature Granulocytes: 0.38 10*3/uL — ABNORMAL HIGH (ref 0.00–0.07)
Abs Immature Granulocytes: 0.54 10*3/uL — ABNORMAL HIGH (ref 0.00–0.07)
Basophils Absolute: 0.1 10*3/uL (ref 0.0–0.1)
Basophils Absolute: 0 10*3/uL (ref 0.0–0.1)
Basophils Relative: 0 %
Basophils Relative: 0 %
Eosinophils Absolute: 0 10*3/uL (ref 0.0–0.5)
Eosinophils Absolute: 0 10*3/uL (ref 0.0–0.5)
Eosinophils Relative: 0 %
Eosinophils Relative: 0 %
HCT: 25.5 % — ABNORMAL LOW (ref 39.0–52.0)
HCT: 25.4 % — ABNORMAL LOW (ref 39.0–52.0)
Hemoglobin: 8.7 g/dL — ABNORMAL LOW (ref 13.0–17.0)
Hemoglobin: 8.8 g/dL — ABNORMAL LOW (ref 13.0–17.0)
Immature Granulocytes: 2 %
Immature Granulocytes: 2 %
Lymphocytes Relative: 3 %
Lymphocytes Relative: 3 %
Lymphs Abs: 0.8 10*3/uL (ref 0.7–4.0)
Lymphs Abs: 0.8 10*3/uL (ref 0.7–4.0)
MCH: 33.5 pg (ref 26.0–34.0)
MCH: 33.5 pg (ref 26.0–34.0)
MCHC: 34.1 g/dL (ref 30.0–36.0)
MCHC: 34.6 g/dL (ref 30.0–36.0)
MCV: 98.1 fL (ref 80.0–100.0)
MCV: 96.6 fL (ref 80.0–100.0)
Monocytes Absolute: 1.3 10*3/uL — ABNORMAL HIGH (ref 0.1–1.0)
Monocytes Absolute: 1.4 10*3/uL — ABNORMAL HIGH (ref 0.1–1.0)
Monocytes Relative: 5 %
Monocytes Relative: 5 %
Neutro Abs: 22.2 10*3/uL — ABNORMAL HIGH (ref 1.7–7.7)
Neutro Abs: 25.9 10*3/uL — ABNORMAL HIGH (ref 1.7–7.7)
Neutrophils Relative %: 90 %
Neutrophils Relative %: 90 %
Platelets: 79 10*3/uL — ABNORMAL LOW (ref 150–400)
Platelets: 71 10*3/uL — ABNORMAL LOW (ref 150–400)
RBC: 2.6 MIL/uL — ABNORMAL LOW (ref 4.22–5.81)
RBC: 2.63 MIL/uL — ABNORMAL LOW (ref 4.22–5.81)
RDW: 16.2 % — ABNORMAL HIGH (ref 11.5–15.5)
RDW: 16.2 % — ABNORMAL HIGH (ref 11.5–15.5)
WBC: 24.7 10*3/uL — ABNORMAL HIGH (ref 4.0–10.5)
WBC: 28.6 10*3/uL — ABNORMAL HIGH (ref 4.0–10.5)
nRBC: 0 % (ref 0.0–0.2)
nRBC: 0 % (ref 0.0–0.2)

## 2021-09-17 LAB — ECHOCARDIOGRAM COMPLETE
AR max vel: 1.97 cm2
AV Peak grad: 7.1 mmHg
Ao pk vel: 1.33 m/s
Area-P 1/2: 5.75 cm2
Calc EF: 58.7 %
Height: 65 in
MV M vel: 5.26 m/s
MV Peak grad: 110.8 mmHg
S' Lateral: 3.5 cm
Single Plane A2C EF: 59.4 %
Single Plane A4C EF: 58.8 %
Weight: 2560.86 oz

## 2021-09-17 LAB — CULTURE, BLOOD (ROUTINE X 2)
Culture: NO GROWTH
Culture: NO GROWTH

## 2021-09-17 LAB — COMPREHENSIVE METABOLIC PANEL
ALT: 38 U/L (ref 0–44)
AST: 30 U/L (ref 15–41)
Albumin: 1.9 g/dL — ABNORMAL LOW (ref 3.5–5.0)
Alkaline Phosphatase: 108 U/L (ref 38–126)
Anion gap: 7 (ref 5–15)
BUN: 10 mg/dL (ref 8–23)
CO2: 20 mmol/L — ABNORMAL LOW (ref 22–32)
Calcium: 9.4 mg/dL (ref 8.9–10.3)
Chloride: 100 mmol/L (ref 98–111)
Creatinine, Ser: 0.54 mg/dL — ABNORMAL LOW (ref 0.61–1.24)
GFR, Estimated: >60 mL/min (ref >60)
Glucose, Bld: 162 mg/dL — ABNORMAL HIGH (ref 70–99)
Potassium: 3.6 mmol/L (ref 3.5–5.1)
Sodium: 127 mmol/L — ABNORMAL LOW (ref 135–145)
Total Bilirubin: 1.2 mg/dL (ref 0.3–1.2)
Total Protein: 5.2 g/dL — ABNORMAL LOW (ref 6.5–8.1)

## 2021-09-17 LAB — LIPID PANEL
Cholesterol: 62 mg/dL (ref 0–200)
HDL: 12 mg/dL — ABNORMAL LOW (ref >40)
LDL Cholesterol: 31 mg/dL (ref 0–99)
Total CHOL/HDL Ratio: 5.2 RATIO
Triglycerides: 94 mg/dL (ref <150)
VLDL: 19 mg/dL (ref 0–40)

## 2021-09-17 LAB — MAGNESIUM: Magnesium: 1.8 mg/dL (ref 1.7–2.4)

## 2021-09-17 LAB — HEMOGLOBIN A1C
Hgb A1c MFr Bld: 5.4 % (ref 4.8–5.6)
Mean Plasma Glucose: 108.28 mg/dL

## 2021-09-17 LAB — PHOSPHORUS: Phosphorus: 1.7 mg/dL — ABNORMAL LOW (ref 2.5–4.6)

## 2021-09-17 LAB — PROTIME-INR
INR: 1.7 — ABNORMAL HIGH (ref 0.8–1.2)
Prothrombin Time: 19.4 seconds — ABNORMAL HIGH (ref 11.4–15.2)

## 2021-09-17 LAB — GLUCOSE, CAPILLARY
Glucose-Capillary: 162 mg/dL — ABNORMAL HIGH (ref 70–99)
Glucose-Capillary: 169 mg/dL — ABNORMAL HIGH (ref 70–99)
Glucose-Capillary: 164 mg/dL — ABNORMAL HIGH (ref 70–99)

## 2021-09-17 LAB — APTT: aPTT: 41 seconds — ABNORMAL HIGH (ref 24–36)

## 2021-09-17 LAB — LACTIC ACID, PLASMA: Lactic Acid, Venous: 2.3 mmol/L (ref 0.5–1.9)

## 2021-09-17 SURGERY — IR WITH ANESTHESIA
Anesthesia: General

## 2021-09-17 MED ORDER — SODIUM CHLORIDE 0.9 % IV SOLN: INTRAVENOUS | Status: DC

## 2021-09-17 MED ORDER — SODIUM CHLORIDE 0.9 % IV SOLN: INTRAVENOUS | Status: DC | PRN

## 2021-09-17 MED ORDER — STROKE: EARLY STAGES OF RECOVERY BOOK: Freq: Once | Status: DC

## 2021-09-17 MED ORDER — ACETAMINOPHEN 650 MG RE SUPP: 650.0000 mg | RECTAL | Status: DC | PRN

## 2021-09-17 MED ORDER — SUGAMMADEX SODIUM 200 MG/2ML IV SOLN
INTRAVENOUS | Status: DC | PRN
  Administered 2021-09-17 (×2): 100 mg via INTRAVENOUS

## 2021-09-17 MED ORDER — SODIUM CHLORIDE 0.9 % IV SOLN
INTRAVENOUS | Status: DC
Start: 2021-09-17 — End: 2021-09-17

## 2021-09-17 MED ORDER — LIDOCAINE HCL (CARDIAC) PF 100 MG/5ML IV SOSY
PREFILLED_SYRINGE | INTRAVENOUS | Status: DC | PRN
  Administered 2021-09-17: 60 mg via INTRATRACHEAL

## 2021-09-17 MED ORDER — METOPROLOL TARTRATE 5 MG/5ML IV SOLN
2.5000 mg | Freq: Four times a day (QID) | INTRAVENOUS | Status: DC
  Administered 2021-09-18: 2.5 mg via INTRAVENOUS
  Filled 2021-09-17: qty 5

## 2021-09-17 MED ORDER — ACETAMINOPHEN 325 MG PO TABS
650.0000 mg | ORAL_TABLET | ORAL | Status: DC | PRN
Start: 2021-09-17 — End: 2021-09-18

## 2021-09-17 MED ORDER — FENTANYL CITRATE (PF) 100 MCG/2ML IJ SOLN
INTRAMUSCULAR | Status: DC | PRN
  Administered 2021-09-17: 100 ug via INTRAVENOUS

## 2021-09-17 MED ORDER — POTASSIUM PHOSPHATES 15 MMOLE/5ML IV SOLN
30.0000 mmol | Freq: Once | INTRAVENOUS | Status: AC
  Administered 2021-09-17: 30 mmol via INTRAVENOUS
  Filled 2021-09-17: qty 10

## 2021-09-17 MED ORDER — METOPROLOL TARTRATE 5 MG/5ML IV SOLN
5.0000 mg | Freq: Once | INTRAVENOUS | Status: AC
  Administered 2021-09-17: 5 mg via INTRAVENOUS
  Filled 2021-09-17: qty 5

## 2021-09-17 MED ORDER — SUCCINYLCHOLINE CHLORIDE 200 MG/10ML IV SOSY
PREFILLED_SYRINGE | INTRAVENOUS | Status: DC | PRN
  Administered 2021-09-17: 80 mg via INTRAVENOUS

## 2021-09-17 MED ORDER — METOPROLOL TARTRATE 5 MG/5ML IV SOLN
5.0000 mg | Freq: Once | INTRAVENOUS | Status: AC
  Administered 2021-09-17: 5 mg via INTRAVENOUS

## 2021-09-17 MED ORDER — SODIUM CHLORIDE 3 % IV SOLN: INTRAVENOUS | Status: DC

## 2021-09-17 MED ORDER — MAGNESIUM SULFATE 2 GM/50ML IV SOLN
2.0000 g | Freq: Once | INTRAVENOUS | Status: AC
  Administered 2021-09-17: 2 g via INTRAVENOUS
  Filled 2021-09-17: qty 50

## 2021-09-17 MED ORDER — ACETAMINOPHEN 160 MG/5ML PO SOLN: 650.0000 mg | ORAL | Status: DC | PRN

## 2021-09-17 MED ORDER — IOHEXOL 300 MG/ML  SOLN
100.0000 mL | Freq: Once | INTRAMUSCULAR | Status: AC | PRN
Start: 2021-09-17 — End: 2021-09-17
  Administered 2021-09-17: 50 mL via INTRA_ARTERIAL

## 2021-09-17 MED ORDER — ONDANSETRON HCL 4 MG/2ML IJ SOLN
INTRAMUSCULAR | Status: DC | PRN
  Administered 2021-09-17: 4 mg via INTRAVENOUS

## 2021-09-17 MED ORDER — PROPOFOL 10 MG/ML IV BOLUS
INTRAVENOUS | Status: DC | PRN
  Administered 2021-09-17: 100 mg via INTRAVENOUS

## 2021-09-17 MED ORDER — ROCURONIUM BROMIDE 100 MG/10ML IV SOLN
INTRAVENOUS | Status: DC | PRN
  Administered 2021-09-17: 40 mg via INTRAVENOUS

## 2021-09-17 MED ORDER — METOPROLOL TARTRATE 25 MG PO TABS
25.0000 mg | ORAL_TABLET | Freq: Two times a day (BID) | ORAL | Status: DC
  Administered 2021-09-17: 25 mg via ORAL
  Filled 2021-09-17: qty 1

## 2021-09-17 MED ORDER — SODIUM CHLORIDE 0.9 % IV SOLN
2000.0000 mg | Freq: Once | INTRAVENOUS | Status: AC
  Administered 2021-09-17: 2000 mg via INTRAVENOUS
  Filled 2021-09-17: qty 20

## 2021-09-17 MED ORDER — CHLORHEXIDINE GLUCONATE CLOTH 2 % EX PADS
6.0000 | MEDICATED_PAD | Freq: Every day | CUTANEOUS | Status: DC
Start: 2021-09-17 — End: 2021-09-19
  Administered 2021-09-17: 6 via TOPICAL

## 2021-09-17 MED ORDER — IOHEXOL 350 MG/ML SOLN
100.0000 mL | Freq: Once | INTRAVENOUS | Status: AC | PRN
Start: 2021-09-17 — End: 2021-09-17
  Administered 2021-09-17: 100 mL via INTRAVENOUS

## 2021-09-17 MED ORDER — BOOST / RESOURCE BREEZE PO LIQD CUSTOM: 1.0000 | Freq: Three times a day (TID) | ORAL | Status: DC

## 2021-09-17 MED ORDER — PHENYLEPHRINE HCL-NACL 20-0.9 MG/250ML-% IV SOLN
INTRAVENOUS | Status: DC | PRN
  Administered 2021-09-17: 20 ug/min via INTRAVENOUS

## 2021-09-17 MED ORDER — CLEVIDIPINE BUTYRATE 0.5 MG/ML IV EMUL: 0.0000 mg/h | INTRAVENOUS | Status: DC

## 2021-09-17 NOTE — Progress Notes (Addendum)
HOSPITAL MEDICINE OVERNIGHT EVENT NOTE    Notified by nursing that earlier this morning patient suddenly began to exhibit sudden onset confusion and what seemed to be an expressive aphasia.  Rapid response was activated and due to concerns for an acute stroke a code stroke was called.  Chart reviewed, patient is currently hospitalized for suspected sepsis complicated by upper gastrointestinal bleeding.  Patient has a known history of atrial fibrillation and pancreatic cancer.  Eliquis was held earlier in the admission due to concerns for gastrointestinal bleeding.  Patient went for stat noncontrast CT head as well as CT angiogram of the head and neck.  Patient is currently receiving a stat teleneurology evaluation.  We will follow-up on neurology recommendations as well as results of head imaging.  Continue to monitor patient closely with serial neurologic checks.  Vernelle Emerald  MD [facility]   ADDENDUM (7/2 3:50am)  Case discussed with Dr. Rory Percy with neurology.  CT angiogram of the head and neck revealing acute M2 occlusion which is likely the etiology of the patient's acute speech changes.  Neurology has discussed these new findings with the family and after long discussion of the risks and benefits they have agreed to proceed with intervention via IR thrombectomy.  Patient is being transferred emergently to [facility], IR lab for attempted thrombectomy with Dr. Debbrah Alar.  Patient will then eventually be transferred to the neurology service on the 4N ICU.  Dr. Johny Chess help is appreciated with this.   Sherryll Burger Vianny Schraeder

## 2021-09-17 NOTE — Consult Note (Addendum)
Neurology Consultation  Reason for Consult: stroke Referring Physician: Dr Cyd Silence, hospitalist-[place] [facility] CC: Aphasia  History is obtained from: Chart, patient's family, telemetry neurology specialist  HPI: Casey Pierce. is a 66 y.o. male past medical history of atrial fibrillation on anticoagulation which was held during this admission due to concern for GI bleed, pancreatic cancer recently completed chemotherapy with possible metastasis to the liver status postbiopsy pending results, admitted to [facility] long hospital since 08/20/2021 when he complained of fevers, generalized fatigue poor appetite and nausea.  GI and oncological work-up was ongoing for mets and possible SBP along with symptomatic anemia requiring transfusion.  Last known well midnight and around 1 AM noted to be "confused".  Speech was difficult to comprehend and had a word salad.  Code stroke was activated.  Telemedicine neurology evaluated the patient and found him to be severely aphasic.  He was unable to follow any commands.  CT head aspects 10.  Not a candidate for TNKase due to recent GI bleed and transfusions.  CT angio head and neck with left M2 occlusion.  Case discussed with the telestroke specialist Dr. Doyle Askew over the phone and call made to the interventional list after discussing with the family in detail the risks and benefits of the procedures.  I spoke with the daughter Casey Pierce, the patient's current clinical condition as was described to me by the telemetry provider as well as imaging findings and possible treatments.  Given his history of cancer, the family told me that he has been provided anywhere from 6 to 8 months to over a year as the life expectancy from his cancer but at baseline is extremely independent and cognizant.  The family-wife and daughters discussed amongst themselves and finally agreed to proceed with thrombectomy.  Alternative of not proceeding with thrombectomy was also  discussed and they chose to go with thrombectomy acknowledging higher risks of complications including but not limited to bleeding given his current GI bleed history, cancer and liver dysfunction.  LKW: 0001 hrs today IV thrombolysis given?: no, recent GI bleed-decision made by the telestroke specialist Premorbid modified Rankin scale (mRS): 0  ROS: Unable to ascertain due to aphasia  Past Medical History:  Diagnosis Date   Atrial fibrillation ([place])    Coronary artery disease    NSTEMI 12/1998 - LHC:  prox 85-90, RCA prox 30-50, EF 65-70 >> XFG:HWEX and BMS to LAD; large Dx jailed by stent (90%), EF 60  // Myoview 7/18: normal perfusion, Low Risk   History of echocardiogram    Echo 7/18: mild LVH, EF 60-65, no RWMA, mild MR, mod LAE   Myocardial infarction [facility])    Pancreatic cancer ([place])    Stroke ([place]) 04/2014   Family History  Problem Relation Age of Onset   Cancer Father 76       tongue/neck   Heart attack Maternal Uncle    Cancer Paternal Uncle        unk type   Social History:   reports that he has never smoked. He quit smokeless tobacco use about 22 years ago.  His smokeless tobacco use included chew. He reports that he does not drink alcohol and does not use drugs.  Medications  Current Facility-Administered Medications:    [START ON 09/18/2021]  stroke: early stages of recovery book, , Does not apply, Once, Amie Portland, MD   0.9 %  sodium chloride infusion, , Intravenous, Continuous, Alekh, Kshitiz, MD   0.9 %  sodium chloride  infusion, , Intravenous, Continuous, Alekh, Kshitiz, MD   acetaminophen (TYLENOL) tablet 650 mg, 650 mg, Oral, Q6H PRN, 650 mg at 10/06/2021 0047 **OR** acetaminophen (TYLENOL) suppository 650 mg, 650 mg, Rectal, Q6H PRN, Alma Friendly, MD   albuterol (PROVENTIL) (2.5 MG/3ML) 0.083% nebulizer solution 2.5 mg, 2.5 mg, Nebulization, Q2H PRN, Horris Latino, Adline Peals, MD   ceFEPIme (MAXIPIME) 2 g in sodium chloride 0.9 % 100 mL IVPB, 2 g,  Intravenous, Q8H, Robertson, Crystal S, RPH, Last Rate: 200 mL/hr at 09/16/21 2135, 2 g at 09/16/21 2135   metroNIDAZOLE (FLAGYL) IVPB 500 mg, 500 mg, Intravenous, Q12H, Alma Friendly, MD, Last Rate: 100 mL/hr at 09/16/21 1619, 500 mg at 09/16/21 1619   multivitamin with minerals tablet 1 tablet, 1 tablet, Oral, Daily, Alekh, Kshitiz, MD, 1 tablet at 09/16/21 1021   ondansetron (ZOFRAN) tablet 4 mg, 4 mg, Oral, Q6H PRN **OR** ondansetron (ZOFRAN) injection 4 mg, 4 mg, Intravenous, Q6H PRN, Alma Friendly, MD, 4 mg at 09/16/21 2232   oxyCODONE (Oxy IR/ROXICODONE) immediate release tablet 5 mg, 5 mg, Oral, Q4H PRN, Alma Friendly, MD, 5 mg at 09/07/2021 2301   pantoprazole (PROTONIX) injection 40 mg, 40 mg, Intravenous, Q12H, Heinz, Coralee Pesa, PA-C, 40 mg at 09/16/21 2136   polyethylene glycol (MIRALAX / GLYCOLAX) packet 17 g, 17 g, Oral, Daily PRN, Alma Friendly, MD   saccharomyces boulardii (FLORASTOR) capsule 250 mg, 250 mg, Oral, BID, Starla Link, Kshitiz, MD, 250 mg at 09/16/21 1753  Current Outpatient Medications:    dronabinol (MARINOL) 5 MG capsule, Take 1 capsule (5 mg total) by mouth 2 (two) times daily before a meal. (Patient taking differently: Take 10 mg by mouth 2 (two) times daily before a meal.), Disp: 30 capsule, Rfl: 0   ELIQUIS 5 MG TABS tablet, TAKE 1 TABLET BY MOUTH TWICE A DAY (Patient taking differently: Take 5 mg by mouth 2 (two) times daily.), Disp: 180 tablet, Rfl: 1   famotidine (PEPCID) 20 MG tablet, Take 20 mg by mouth at bedtime as needed for heartburn or indigestion., Disp: , Rfl:    lidocaine-prilocaine (EMLA) cream, Apply to affected area once (Patient taking differently: Apply 1 application  topically daily as needed. Apply to affected area), Disp: 30 g, Rfl: 3   Multiple Vitamin (MULTI-VITAMIN DAILY PO), Take 1 tablet by mouth in the morning and at bedtime., Disp: , Rfl:    Omega-3 Fatty Acids (FISH OIL PO), Take 1 capsule by mouth daily., Disp: , Rfl:     prochlorperazine (COMPAZINE) 10 MG tablet, Take 1 tablet (10 mg total) by mouth every 6 (six) hours as needed (Nausea or vomiting)., Disp: 30 tablet, Rfl: 1   urea (CARMOL) 20 % lotion, Apply 1 Application topically as needed (for rash)., Disp: , Rfl:   Exam: Current vital signs: BP 127/81 (BP Location: Right Arm)   Pulse (!) 118   Temp 99.1 F (37.3 C) (Oral)   Resp 18   Ht '5\' 5"'$  (1.651 m)   Wt 72.6 kg   SpO2 97%   BMI 26.63 kg/m  Vital signs in last 24 hours: Temp:  [97.8 F (36.6 C)-99.1 F (37.3 C)] 99.1 F (37.3 C) (07/02 0103) Pulse Rate:  [80-118] 118 (07/02 0240) Resp:  [14-20] 18 (07/02 0103) BP: (124-135)/(75-90) 127/81 (07/02 0240) SpO2:  [97 %-98 %] 97 % (07/02 0103) General: Awake alert in no distress HEENT: Normocephalic atraumatic CVs: Regular rhythm Respiratory: Breathing well saturating normally on room air Abdomen tender Extremities warm  well perfused Neurologic exam He is awake alert Is able to tell me his name He is not able to tell me his age-tells me his date of birth no matter what question asked him. He is not able to tell me the month. He is not able to follow commands when asked verbally or when asked to mimic. Had to raise his legs passively to let him keep them up and same with his hands. Cranial nerve examination: 2-12 intact Motor examination with symmetric drift in bilateral lower extremity but nonfocal. Sensation intact Coordination difficult to assess but no gross dysmetria NIH stroke scale-11  Labs I have reviewed labs in epic and the results pertinent to this consultation are: CBC    Component Value Date/Time   WBC 24.7 (H) 10/10/2021 0246   RBC 2.60 (L) 09/20/2021 0246   HGB 8.7 (L) 09/18/2021 0246   HGB 8.0 (L) 08/31/2021 1106   HGB 15.7 09/05/2016 1057   HCT 25.5 (L) 10/01/2021 0246   HCT 45.8 09/05/2016 1057   PLT 79 (L) 09/23/2021 0246   PLT 146 (L) 08/31/2021 1106   PLT 246 09/05/2016 1057   MCV 98.1 09/22/2021  0246   MCV 88 09/05/2016 1057   MCH 33.5 10/09/2021 0246   MCHC 34.1 09/27/2021 0246   RDW 16.2 (H) 10/07/2021 0246   RDW 13.1 09/05/2016 1057   LYMPHSABS 0.8 10/01/2021 0246   MONOABS 1.3 (H) 09/27/2021 0246   EOSABS 0.0 10/03/2021 0246   BASOSABS 0.1 10/16/2021 0246    CMP     Component Value Date/Time   NA 127 (L) 09/29/2021 0246   NA 138 09/05/2016 1057   K 3.6 09/18/2021 0246   CL 100 09/18/2021 0246   CO2 20 (L) 10/10/2021 0246   GLUCOSE 162 (H) 09/16/2021 0246   BUN 10 10/10/2021 0246   BUN 10 09/05/2016 1057   CREATININE 0.54 (L) 10/10/2021 0246   CREATININE 0.45 (L) 08/31/2021 1106   CREATININE 0.58 (L) 08/16/2021 0857   CALCIUM 9.4 09/24/2021 0246   PROT 5.2 (L) 10/13/2021 0246   ALBUMIN 1.9 (L) 10/03/2021 0246   Pierce 30 10/02/2021 0246   Pierce 32 08/31/2021 1106   ALT 38 10/02/2021 0246   ALT 51 (H) 08/31/2021 1106   ALKPHOS 108 09/27/2021 0246   BILITOT 1.2 10/05/2021 0246   BILITOT 0.5 08/31/2021 1106   GFRNONAA >60 09/22/2021 0246   GFRNONAA >60 08/31/2021 1106   GFRAA 108 09/05/2016 1057   Imaging I have reviewed the images obtained:  CT-head-aspects 10.  No bleed CTA head and neck: Left M2 occlusion CT perfusion-although last known well is within 6 hours shows no core and salvageable tissue of 40 cc corresponding to the left M2 territory.  Assessment:  66 year old man with permanent A-fib who is on Eliquis which was held due to possible GI bleed, pancreatic cancer with possible mets to the liver, admitted to [facility] long hospital for treatment of possible SBP, possible upper GI bleeding requiring transfusions, lactic acidosis, leukocytosis hyponatremia, anemia of chronic disease and thrombocytopenia had a sudden onset of aphasia at [facility] long hospital with last known well at midnight.  Seen by telestroke-not deemed to be a candidate for TNKase due to recent bleed.  Vessel imaging with left M2 occlusion. I was approached by the telestroke doctor for  possible intervention discussion. I called the family-spoke with the wife and daughter Donia Pierce in extreme detail about options available at this time for his stroke treatment which are essentially thrombectomy.  Given his pre-existing conditions, he would be high risk for the procedure.  The family discussed amongst themselves and eventually decided that they would want to go for thrombectomy understanding the risks and benefits. Code IR was activated and patient is currently in the angio suite getting thrombectomy.  Impression: -Left MCA stroke-M2 branch-likely cardioembolic versus hypercoagulability from cancer -Pancreatic cancer with possible liver mets -Thrombocytopenia -A-fib-anticoagulation held over the last few days due to GI bleed-outpatient was on Eliquis -Hyponatremia -Leukocytosis -Lactic acidosis -Possible SBP  Recommendations: Admit to neuro ICU under the neurology service after thrombectomy for acute post thrombectomy care. Frequent neurochecks Telemetry MRI brain 2D echo A1c Lipid panel PT OT Speech therapy I would seek the assistance of critical care team for medical management of his leukocytosis, possible SBP and possible upper GI bleeding.  I would also recommend ongoing care by GI and oncology as he was receiving at [facility]. Check labs and replete electrolytes as necessary Currently on cefepime and metronidazole for SBP-continue for now.  Plan was discussed with Dr. Cyd Silence over the phone.  Plan was also discussed with telestroke specialist Dr. Doyle Askew over the phone.  Imaging and treatment decision for thrombectomy was discussed with Dr. Ladean Raya, [facility]  Patient to be transferred to the medical service after acute stroke care.   CODE STATUS - FULL CODE. Would recommend getting palliative medicine involved.   [id number] hrs TICI3 s/p IR Afib with RVR. Given one dose of Metoprolol IV Will await CCM consult and medical  management   -- Amie Portland, MD Neurologist [facility] Pager: [phone]   CRITICAL CARE ATTESTATION Performed by: Amie Portland, MD Total critical care time: 50 minutes Critical care time was exclusive of separately billable procedures and treating other patients and/or supervising APPs/Residents/Students Critical care was necessary to treat or prevent imminent or life-threatening deterioration due to acute ischemic stroke This patient is critically ill and at significant risk for neurological worsening and/or death and care requires constant monitoring. Critical care was time spent personally by me on the following activities: development of treatment plan with patient and/or surrogate as well as nursing, discussions with consultants, evaluation of patient's response to treatment, examination of patient, obtaining history from patient or surrogate, ordering and performing treatments and interventions, ordering and review of laboratory studies, ordering and review of radiographic studies, pulse oximetry, re-evaluation of patient's condition, participation in multidisciplinary rounds and medical decision making of high complexity in the care of this patient.

## 2021-09-17 NOTE — Anesthesia Preprocedure Evaluation (Signed)
Anesthesia Evaluation    Reviewed: Allergy & Precautions, Patient's Chart, lab work & pertinent test resultsPreop documentation limited or incomplete due to emergent nature of procedure.  Airway Mallampati: Unable to assess       Dental   Pulmonary    Pulmonary exam normal        Cardiovascular + CAD, + Past MI and + Cardiac Stents  Normal cardiovascular exam     Neuro/Psych TIACVA    GI/Hepatic   Endo/Other  Pancreatic cancer   Renal/GU      Musculoskeletal   Abdominal   Peds  Hematology  (+) Blood dyscrasia, anemia , Thrombocytopenia Hyponatremia    Anesthesia Other Findings Code Stroke  Reproductive/Obstetrics                             Anesthesia Physical Anesthesia Plan  ASA: 3 and emergent  Anesthesia Plan: General   Post-op Pain Management:    Induction: Intravenous and Rapid sequence  PONV Risk Score and Plan: 2 and Ondansetron, Dexamethasone and Treatment may vary due to age or medical condition  Airway Management Planned: Oral ETT  Additional Equipment:   Intra-op Plan:   Post-operative Plan: Possible Post-op intubation/ventilation  Informed Consent:     Only emergency history available  Plan Discussed with: CRNA  Anesthesia Plan Comments:         Anesthesia Quick Evaluation

## 2021-09-17 NOTE — Progress Notes (Signed)
Patient was noted to have a neuro change after being awakened from a nap. Patient has a fixed left gaze. With no verbal response. Patient responds to pain. CCM and Stroke notified and orders given for a stat head CT. Family was at the bedside and notified on the changes in the patients condition. Will continue to monitor.

## 2021-09-17 NOTE — Evaluation (Signed)
Occupational Therapy Evaluation Patient Details Name: Casey Pierce. MRN: [id number] DOB: 02/28/1956 Today's Date: 10/09/2021   History of Present Illness 66 yo male admitted with sepsis, ascites, new liver mets. S/P liver biopsy/paracentesis. Hx of Afib, CAD, MI, pancreatic Ca, CVA   Clinical Impression   [name] was evaluated s/p the above admission list, he is generally indep at baseline. Upon evaluation pt was lethargic initially, LOA improved with upright posture. He had functional limitations due to impaired cognition, impaired expressive and receptive speech, general weakness with R weaker than L, decreased activity tolerance, and impaired balance. Overall he required mod-max A for transfers, max A for LB ADLs and min-mod A for UB ADLs. Pt will benefit from continued OT acutely. Recommend pt get follow up OT at next venue: [facility]?     Recommendations for follow up therapy are one component of a multi-disciplinary discharge planning process, led by the attending physician.  Recommendations may be updated based on patient status, additional functional criteria and insurance authorization.   Follow Up Recommendations  Other (comment) (Pt planning to d/c back to [facility] for cancer treatment - plan for f/y therapy at [facility])    Assistance Recommended at Discharge Frequent or constant Supervision/Assistance  Patient can return home with the following A lot of help with walking and/or transfers;A lot of help with bathing/dressing/bathroom;Assistance with cooking/housework;Assistance with feeding;Assist for transportation;Help with stairs or ramp for entrance    Functional Status Assessment  Patient has had a recent decline in their functional status and demonstrates the ability to make significant improvements in function in a reasonable and predictable amount of time.  Equipment Recommendations  Tub/shower bench;Wheelchair (measurements OT);Wheelchair cushion (measurements OT);Hospital bed  (RW)    Recommendations for Other Services       Precautions / Restrictions Precautions Precautions: Fall Restrictions Weight Bearing Restrictions: No      Mobility Bed Mobility Overal bed mobility: Needs Assistance             General bed mobility comments: OOB in chair upon arrival    Transfers Overall transfer level: Needs assistance Equipment used: Rolling walker (2 wheels) Transfers: Sit to/from Stand Sit to Stand: Max assist           General transfer comment: max A to stand full upright without leaning onto the chair      Balance Overall balance assessment: Needs assistance Sitting-balance support: Feet supported Sitting balance-Leahy Scale: Fair Sitting balance - Comments: limited tolerance to sitting unsupported Postural control: Posterior lean Standing balance support: Bilateral upper extremity supported, During functional activity Standing balance-Leahy Scale: Poor                             ADL either performed or assessed with clinical judgement   ADL Overall ADL's : Needs assistance/impaired Eating/Feeding: Moderate assistance;Sitting   Grooming: Moderate assistance;Sitting Grooming Details (indicate cue type and reason): oral hygiene this session, step by step cues, assist for management of brush and paste Upper Body Bathing: Moderate assistance;Sitting   Lower Body Bathing: Maximal assistance;Sit to/from stand   Upper Body Dressing : Minimal assistance;Sitting   Lower Body Dressing: Maximal assistance;Sit to/from stand   Toilet Transfer: Maximal assistance;Ambulation;Rolling walker (2 wheels)   Toileting- Clothing Manipulation and Hygiene: Maximal assistance;Sit to/from stand       Functional mobility during ADLs: Maximal assistance General ADL Comments: impaired cognition, lethargic, generally weak, posterior bias     Vision Baseline Vision/History: 1  Wears glasses Vision Assessment?: Vision impaired- to be  further tested in functional context Additional Comments: difficult to assess - pt not following commands on attempt to assess vision. Not tracking visual or auditory stim     Perception     Praxis      Pertinent Vitals/Pain Pain Assessment Pain Assessment: No/denies pain     Hand Dominance Right   Extremity/Trunk Assessment Upper Extremity Assessment Upper Extremity Assessment: RUE deficits/detail;LUE deficits/detail RUE Deficits / Details: RUE 3/5 and much weaker than L, family states he has residual paraesthesias and dexterity limitations from a CVA 7 years ago RUE Sensation: decreased light touch RUE Coordination: decreased fine motor LUE Deficits / Details: overall 4/5 with full ROM LUE Sensation: decreased light touch LUE Coordination: decreased fine motor;decreased gross motor   Lower Extremity Assessment Lower Extremity Assessment: Defer to PT evaluation   Cervical / Trunk Assessment Cervical / Trunk Assessment: Kyphotic   Communication Communication Communication: No difficulties   Cognition Arousal/Alertness: Lethargic Behavior During Therapy: Flat affect Overall Cognitive Status: Difficult to assess                                 General Comments: able to state name, birthday and "[place]" as location. Identified a toothbrush but not a pen. Intermittently following 1 step commands, requires increased time and cues. difficult to decipher between impaired comminication and cognition     General Comments  VSS on RA, family present and supportive    Exercises     Shoulder Instructions      Home Living Family/patient expects to be discharged to:: Private residence Living Arrangements: Spouse/significant other Available Help at Discharge: Family Type of Home: House Home Access: Level entry     Home Layout: Multi-level Alternate Level Stairs-Number of Steps: 3 down to den? Alternate Level Stairs-Rails: Right Bathroom Shower/Tub: Emergency planning/management officer: Handicapped height     Home Equipment: None          Prior Functioning/Environment Prior Level of Function : Independent/Modified Independent                        OT Problem List: Decreased strength;Decreased activity tolerance;Decreased range of motion;Impaired balance (sitting and/or standing);Decreased safety awareness;Decreased knowledge of use of DME or AE;Decreased knowledge of precautions;Impaired UE functional use      OT Treatment/Interventions: Self-care/ADL training;Neuromuscular education;Therapeutic exercise;DME and/or AE instruction;Therapeutic activities;Patient/family education;Balance training    OT Goals(Current goals can be found in the care plan section) Acute Rehab OT Goals Patient Stated Goal: home per family OT Goal Formulation: With patient Time For Goal Achievement: 10/01/21 Potential to Achieve Goals: Good ADL Goals Pt Will Perform Grooming: with set-up;sitting Pt Will Perform Upper Body Dressing: with set-up;sitting Pt Will Perform Lower Body Dressing: with min assist;sit to/from stand Pt Will Transfer to Toilet: with min assist;ambulating  OT Frequency: Min 2X/week    Co-evaluation              AM-PAC OT "6 Clicks" Daily Activity     Outcome Measure Help from another person eating meals?: A Lot Help from another person taking care of personal grooming?: A Lot Help from another person toileting, which includes using toliet, bedpan, or urinal?: A Lot Help from another person bathing (including washing, rinsing, drying)?: A Lot Help from another person to put on and taking off regular upper body clothing?: A Lot Help from  another person to put on and taking off regular lower body clothing?: A Lot 6 Click Score: 12   End of Session Equipment Utilized During Treatment: Rolling walker (2 wheels) Nurse Communication: Mobility status  Activity Tolerance: Patient tolerated treatment well;Patient limited by  lethargy Patient left: in chair;with call bell/phone within reach;with chair alarm set;with family/visitor present  OT Visit Diagnosis: Unsteadiness on feet (R26.81);Other abnormalities of gait and mobility (R26.89);Repeated falls (R29.6);Muscle weakness (generalized) (M62.81);Hemiplegia and hemiparesis Hemiplegia - Right/Left: Right Hemiplegia - dominant/non-dominant: Dominant Hemiplegia - caused by: Cerebral infarction                Time: 8483-5075 OT Time Calculation (min): 21 min Charges:  OT General Charges $OT Visit: 1 Visit OT Evaluation $OT Eval Moderate Complexity: 1 Mod   Emanuelle Bastos A Antonetta Clanton 10/05/2021, 2:42 PM

## 2021-09-17 NOTE — Consult Note (Signed)
[facility] TeleNeurology Consult Services   Patient Name:   Casey Pierce, Casey Pierce Date of Birth:   09/26/1955 Identification Number:   MRN - [id number] Date of Service:   09/24/2021 02:25:56  Diagnosis:       I63.9 - Cerebrovascular accident (CVA), unspecified mechanism ([place])  Impression:      1. acute ischemic stroke  - dense mixed aphasia  - afib not on AC due to GI bleed and anemia requiring transfusion  - CTH no acute findings, aspects 10  - CTA L M2 occlusion, CTP L temporoccipital 40cc perfusion defect that correlates to his dense mixed aphasia    Our recommendations are outlined below.  Recommendations:        Stroke/Telemetry Floor       Neuro Checks q4h       Bedside Swallow Eval       DVT Prophylaxis       IV Fluids, Normal Saline       Head of Bed 30 Degrees       Euglycemia and Avoid Hyperthermia (PRN Acetaminophen)       Antihypertensives PRN if Blood pressure is greater than 220/120 or there is a concern for End organ damage/contraindications for permissive HTN. If blood pressure is greater than 220/120 give labetalol PO or IV or Vasotec IV with a goal of 15% reduction in BP during the first 24 hours.       Agree with emergent transfer for mechnical thrombectomy, if clear for AP then recommend aspirin '81mg'$  while waiting clearance for full AC.       Will need full stroke work up and full [facility] when cleared for it  Per facility request will defer further work up, management, and referrals to inpatient service, inclusive of inpatient neurology consult.  Sign Out:       Discussed with Rapid Response Team    ------------------------------------------------------------------------------  Advanced Imaging: CTA Head and Neck Completed.  CTP Completed.  LVO:Yes  Discussed with NIR:No  Discussed with NIR Text:  Spoke with neurohospitalist Dr. Malen Gauze at [facility] who spoke with NIR per their protocol, patient is being transferred for  thrombectomy.   Metrics: Last Known Well: 10/10/2021 01:00:00 [facility] Notification Time: 09/30/2021 02:25:56 Stamp Time: 10/06/2021 02:25:56 Initial Response Time: 09/18/2021 02:29:00 Symptoms: speech change. Initial patient interaction: 10/02/2021 02:40:55 NIHSS Assessment Completed: 10/12/2021 02:44:00 Patient is not a candidate for Thrombolytic. Thrombolytic Medical Decision: 10/14/2021 02:44:00 Patient was not deemed candidate for Thrombolytic because of following reasons: GI Bleeding (Within 21 Days).  CT head showed no acute hemorrhage or acute core infarct. I personally Reviewed the CT Head and it Showed No Acute Hemorrhage or Acute Core Infarct  Primary Provider Notified of Diagnostic Impression and Management Plan on: 09/27/2021 03:01:00 Spoke With: Dr. [place] Crane Able to Reach 10/02/2021 03:01:00    ------------------------------------------------------------------------------  History of Present Illness: Patient is a 66 year old Male.  Inpatient stroke alert was called for symptoms of speech change. Casey Pierce is a 66yo gentleman pmh afib not on [facility], stroke, CAD, pancreatic cancer w/liver mets who initialy presented 09/01/2021 with fever, abdominal pain, and melena. Being treated possible SBP, anemia requiring transfusion with concern for possible upper GIB so eliquis was held, and CT evidence of cancer progression. Was at baseline until midnight, around 0100 found to be "increasingly confused" and with word salad.      Past Medical History:      Atrial Fibrillation      Stroke  Medications:  No Anticoagulant use  No Antiplatelet use Reviewed EMR for current medications  Allergies:  Reviewed  Social History: Smoking: No Alcohol Use: No Drug Use: No  Family History:  There is no family history of premature cerebrovascular disease pertinent to this consultation  ROS : 14 Points Review of Systems was performed and was negative except mentioned in  HPI.  Past Surgical History: There Is No Surgical History Contributory To Today's Visit     Examination: BP(133/87), Pulse(115), 1A: Level of Consciousness - Alert; keenly responsive + 0 1B: Ask Month and Age - Aphasic + 2 1C: Blink Eyes & Squeeze Hands - Performs 0 Tasks + 2 2: Test Horizontal Extraocular Movements - Normal + 0 3: Test Visual Fields - No Visual Loss + 0 4: Test Facial Palsy (Use Grimace if Obtunded) - Normal symmetry + 0 5A: Test Left Arm Motor Drift - No Drift for 10 Seconds + 0 5B: Test Right Arm Motor Drift - No Drift for 10 Seconds + 0 6A: Test Left Leg Motor Drift - Drift, hits bed + 2 6B: Test Right Leg Motor Drift - Drift, hits bed + 2 7: Test Limb Ataxia (FNF/Heel-Shin) - No Ataxia + 0 8: Test Sensation - Normal; No sensory loss + 0 9: Test Language/Aphasia - Mute/Global Aphasia: No Usable Speech/Auditory Comprehension + 3 10: Test Dysarthria - Mute/Anarthric + 2 11: Test Extinction/Inattention - No abnormality + 0  NIHSS Score: 13  NIHSS Free Text : Perseverative speech, just repeats birthday when asked questions. Does not attempt to hold up either leg when lifted for him. Will maintain elevated arms when they are lifted for him. Follows no verbal commands. Follows some visual commands.  Pre-Morbid Modified Rankin Scale: 0 Points = No symptoms at all   Patient/Family was informed the Neurology Consult would occur via TeleHealth consult by way of interactive audio and video telecommunications and consented to receiving care in this manner.   Patient is being evaluated for possible acute neurologic impairment and high probability of imminent or life-threatening deterioration. I spent total of 30 minutes providing care to this patient, including time for face to face visit via telemedicine, review of medical records, imaging studies and discussion of findings with providers, the patient and/or family.   Dr Wendi Snipes   [facility] [phone]   Case [id number]

## 2021-09-17 NOTE — Progress Notes (Addendum)
STROKE TEAM PROGRESS NOTE   INTERVAL HISTORY His family is at the bedside.  CCM to take as primary.  He was initially admitted to [facility] long with fevers, nausea vomiting, anemia in the setting of pancreatic cancer status post chemo.  Work-up there revealed new liver lesions concerning for mets and hemorrhagic duodenitis.  Anticoagulation for A-fib was held due to anemia.  He then developed aphasia and was found to have a left M2 occlusion and was transferred to [facility] for a thrombectomy with TICI3 revascularization of the left MCA.  On exam he is still exhibits some expressive aphasia however he is able to name objects appropriately he does perseverate on his answers to questions.  Vitals:   10/09/2021 0630 10/06/2021 0649 09/27/2021 0700 09/28/2021 0730  BP: 132/88  114/76 124/79  Pulse: (!) 125 96 91 89  Resp: 20 (!) 23 (!) 22 (!) 22  Temp:      TempSrc:      SpO2: 98% 95% 98% 98%  Weight:      Height:       CBC:  Recent Labs  Lab 09/16/21 0537 10/16/2021 0246  WBC 21.4* 24.7*  NEUTROABS 19.1* 22.2*  HGB 9.0* 8.7*  HCT 27.0* 25.5*  MCV 97.8 98.1  PLT 107* 79*   Basic Metabolic Panel:  Recent Labs  Lab 09/16/21 0537 09/24/2021 0246  NA 130* 127*  K 3.2* 3.6  CL 102 100  CO2 22 20*  GLUCOSE 154* 162*  BUN 11 10  CREATININE 0.37* 0.54*  CALCIUM 9.2 9.4  MG 1.8 1.8  PHOS  --  1.7*   Lipid Panel: No results for input(s): "CHOL", "TRIG", "HDL", "CHOLHDL", "VLDL", "LDLCALC" in the last 168 hours. HgbA1c:  Recent Labs  Lab 10/16/2021 0724  HGBA1C 5.4   Urine Drug Screen: No results for input(s): "LABOPIA", "COCAINSCRNUR", "LABBENZ", "AMPHETMU", "THCU", "LABBARB" in the last 168 hours.  Alcohol Level No results for input(s): "ETH" in the last 168 hours.  IMAGING past 24 hours CT ANGIO HEAD NECK W WO CM W PERF (CODE STROKE)  Result Date: 09/29/2021 CLINICAL DATA:  Initial evaluation for neuro deficit, stroke suspected. EXAM: CT ANGIOGRAPHY HEAD AND NECK CT PERFUSION BRAIN  TECHNIQUE: Multidetector CT imaging of the head and neck was performed using the standard protocol during bolus administration of intravenous contrast. Multiplanar CT image reconstructions and MIPs were obtained to evaluate the vascular anatomy. Carotid stenosis measurements (when applicable) are obtained utilizing NASCET criteria, using the distal internal carotid diameter as the denominator. Multiphase CT imaging of the brain was performed following IV bolus contrast injection. Subsequent parametric perfusion maps were calculated using RAPID software. RADIATION DOSE REDUCTION: This exam was performed according to the departmental dose-optimization program which includes automated exposure control, adjustment of the mA and/or kV according to patient size and/or use of iterative reconstruction technique. CONTRAST:  130m OMNIPAQUE IOHEXOL 350 MG/ML SOLN COMPARISON:  CT from earlier the same day. FINDINGS: CTA NECK FINDINGS Aortic arch: Visualized aortic arch normal in caliber with standard 3 vessel morphology. No stenosis seen about the origin the great vessels. Right carotid system: Right common and internal carotid arteries patent without stenosis or dissection. Left carotid system: Left CCA patent from its origin to the bifurcation. Mild eccentric plaque about the left carotid bulb without significant stenosis. Left ICA patent distally without stenosis or dissection. Vertebral arteries: Both vertebral arteries arise from subclavian arteries. No proximal subclavian artery stenosis. Strongly dominant right vertebral artery with a diffusely hypoplastic left vertebral  artery. Vertebral arteries patent without stenosis or dissection. Skeleton: No discrete or worrisome osseous lesions. Moderate spondylosis present at C5-6. Other neck: No other acute soft tissue abnormality within the neck. Upper chest: Moderate to large layering bilateral pleural effusions, partially visualized. Associated atelectasis. Left-sided  Port-A-Cath noted. Review of the MIP images confirms the above findings CTA HEAD FINDINGS Anterior circulation: Petrous segments patent bilaterally. Atheromatous change within the carotid siphons without hemodynamically significant stenosis. Left A1 segment dominant and widely patent. Right A1 hypoplastic and/or absent, accounting for the slightly diminutive right ICA is compared to the left. Normal anterior communicating complex. Anterior cerebral arteries patent to their distal aspects without stenosis. No M1 stenosis or occlusion. Distal right MCA branches well perfused. On the left, there is acute occlusion of a left M2 branch, inferior division (series 12, image 33). Some intermittent perfusion is seen distally, which could be related to subocclusive thrombus and/or collateralization Posterior circulation: Mild nonstenotic plaque noted within the dominant right V4 segment. Right PICA patent. Hypoplastic left vertebral artery largely terminates in PICA. Left PICA patent as well. Basilar patent to its distal aspect without stenosis. Superior cerebral arteries patent bilaterally. Left PCA supplied via the basilar. Predominant fetal type origin of the right PCA. Both PCAs remain patent to their distal aspects. Venous sinuses: Patent allowing for timing the contrast bolus. Anatomic variants: As above.  No aneurysm. Review of the MIP images confirms the above findings CT Brain Perfusion Findings: ASPECTS: 10 CBF (<30%) Volume: 50m Perfusion (Tmax>6.0s) volume: 421mMismatch Volume: 4088mnfarction Location:No acute core infarct evident by CT perfusion. 40 cc perfusion deficit involving the posterior left temporal occipital region, posterior left MCA distribution, in keeping with the above seen MCA branch occlusion. IMPRESSION: 1. Positive CTA with acute left M2 occlusion as above. 2. 40 cc perfusion deficit involving the posterior left temporoccipital region, in keeping with the above occlusion. No core infarct  evident by CT perfusion. 3. Underlying mild for age atherosclerotic change elsewhere about the major arterial vasculature of the head and neck. No other hemodynamically significant or correctable stenosis. 4. Moderate to large layering bilateral pleural effusions with associated atelectasis. Critical Value/emergent results were called by telephone at the time of interpretation on 10/07/2021 at 2:55/am to [facility] verb[facility] these results. Electronically Signed   By: BenJeannine BogaD.   On: 10/03/2021 03:13   CT HEAD CODE STROKE WO CONTRAST  Result Date: 09/28/2021 CLINICAL DATA:  Code stroke. Initial evaluation for neuro deficit, stroke suspected. EXAM: CT HEAD WITHOUT CONTRAST TECHNIQUE: Contiguous axial images were obtained from the base of the skull through the vertex without intravenous contrast. RADIATION DOSE REDUCTION: This exam was performed according to the departmental dose-optimization program which includes automated exposure control, adjustment of the mA and/or kV according to patient size and/or use of iterative reconstruction technique. COMPARISON:  Prior study from 05/04/2014. FINDINGS: Brain: Mild age-related cerebral atrophy with chronic small vessel ischemic disease. Small focus of encephalomalacia at the left frontal parietal region consistent with a chronic ischemic infarct. Small remote lacunar infarct at the left lentiform nucleus. Additional tiny remote right cerebellar infarct. No acute intracranial hemorrhage. No visible acute large vessel territory infarct. No mass lesion, mass effect or midline shift. No hydrocephalus or extra-axial fluid collection. Vascular: No hyperdense vessel. Scattered vascular calcifications noted within the carotid siphons. Skull: Scalp soft tissues demonstrate no acute finding. Calvarium intact. Sinuses/Orbits: Optic drusen calcifications noted. Globes and orbital soft tissues demonstrate no acute finding. Mild  pneumatized  secretions noted within the sphenoid sinuses. Small right maxillary sinus retention cyst. No significant mastoid effusion. Other: None. ASPECTS [facility] Stroke Program Early CT Score) - Ganglionic level infarction (caudate, lentiform nuclei, internal capsule, insula, M1-M3 cortex): 7 - Supraganglionic infarction (M4-M6 cortex): 3 Total score (0-10 with 10 being normal): 10 IMPRESSION: 1. No acute intracranial abnormality. 2. ASPECTS is 10. 3. Atrophy with mild chronic small vessel ischemic disease, with small remote infarcts involving the left frontoparietal junction, left lentiform nucleus, and right cerebellum. Electronically Signed   By: Jeannine Boga M.D.   On: 10/14/2021 02:27    PHYSICAL EXAM  Physical Exam  Constitutional: Appears well-developed and well-nourished.   Cardiovascular: Normal rate and regular rhythm.  Respiratory: Effort normal, non-labored breathing  Neuro: Mental Status: Patient is awake, alert with some expressive aphasia He is able to identify people in the room, he perseverates on these answers when asked additional questions.  Naming and repetition intact. Cranial Nerves: II: Visual Fields are full. Pupils are equal, round, and reactive to light.   III,IV, VI: EOMI without ptosis or diploplia.  V: Facial sensation is symmetric to temperature VII: Facial movement is symmetric resting and smiling VIII: Hearing is intact to voice X: Palate elevates symmetrically XI: Shoulder shrug is symmetric. XII: Tongue protrudes midline without atrophy or fasciculations.  Motor: Tone is normal. Bulk is normal.  RUE 4/5 LUE 5/5 RLE 4/5 LLE 5/5 Sensory: Sensation is symmetric to light touch and temperature in the arms and legs. No extinction to DSS present.  Deep Tendon Reflexes: 2+ and symmetric in the biceps and patellae.  Plantars: Toes are downgoing bilaterally.  Cerebellar: FNF and HKS are intact bilaterally    ASSESSMENT/PLAN [name]. is a 66  y.o. male with history of atrial fibrillation on anticoagulation which was held during this admission due to concern for GI bleed, pancreatic cancer recently completed chemotherapy with possible metastasis to the liver status postbiopsy pending results, admitted to [facility] long hospital since 08/23/2021 when he complained of fevers, generalized fatigue poor appetite and nausea.  GI and oncological work-up was ongoing for mets and possible SBP along with symptomatic anemia requiring transfusion.  Last known well midnight and around 1 AM noted to be "confused".  Speech was difficult to comprehend and had a word salad.  Code stroke was activated.  Telemedicine neurology evaluated the patient and found him to be severely aphasic.  He was unable to follow any commands.  Stroke:  left M2 occlusion s/p mechanical thrombectomy with TICI3 revascularization Etiology:  Cardio embolic in the setting of atrial fibrillation on eliquis (held d/t concern for GI bleed)  Code Stroke CT head No acute abnormality. Small vessel disease. Atrophy. ASPECTS 10.    CTA head & neck left M2 occlusion, bilateral pleural effusions and atelectasis CT perfusion 40 cc perfusion deficit, core volume 0 Post IR CT no hemorrhagic transformation MRI  Pending 2D Echo EF 50-55% LDL No results found for requested labs within last 1095 days. HgbA1c 5.4 VTE prophylaxis -SCDs    Diet   Diet NPO time specified   Eliquis (apixaban) daily prior to admission, now on No antithrombotic.  Initially held due to anemia and in anticipation of procedure Therapy recommendations: Pending Disposition: Pending  Atrial fibrillation Home med: Eliquis- on hold due to anemia Rate control with IVP lopressor until SLP   Hypertension Home meds: None Stable Permissive hypertension (OK if < 220/120) but gradually normalize in 5-7 days Long-term BP goal  normotensive Cleviprex ordered if needed  Hyperlipidemia Home meds: None LDL No results found for  requested labs within last 1095 days., goal < 70 Continue statin at discharge  Leukocytosis WBC- 24.7 CCM to manage Cefepime and Flagyl ordered  Other Stroke Risk Factors Advanced Age >/= 53  Obesity, Body mass index is 26.63 kg/m., BMI >/= 30 associated with increased stroke risk, recommend weight loss, diet and exercise as appropriate   Other Active Problems Pancreatic cancer with questionable mets to the liver Thrombocytopenia  Hospital day # 5  Patient seen and examined by NP/APP with MD. MD to update note as needed.   Janine Ores, DNP, FNP-BC [facility] Pager: [phone]  ATTENDING ATTESTATION:  This 66 year old inpatient code stroke at [facility] long with history of pancreatic cancer and liver mets, recent GI bleed and atrial fibrillation off Eliquis.  Have a left M2 occlusion transferred to [facility] for thrombectomy.  Not a TNK candidate due to recent GI bleed.  Doing remarkably well given recent CVA.  See exam above.  Appreciate CCM assistance and taking on as primary.  Continue systolic blood pressure goal 120-140 until tomorrow morning and then can liberalize to less than 160.  MRI brain pending.  We will continue to follow.  Family was updated and all questions were answered to their satisfaction.  Dr. Reeves Forth evaluated pt independently, reviewed imaging, chart, labs. Discussed and formulated plan with the APP. Please see APP note above for details.      This patient is critically ill due to history of pancreatic cancer with liver mets, GI bleed, atrial fibrillation acute stroke with left M2 occlusion s/p thrombectomy and at significant risk of neurological worsening, death form heart failure, respiratory failure, recurrent stroke, bleeding from [facility], seizure, sepsis. This patient's care requires constant monitoring of vital signs, hemodynamics, respiratory and cardiac monitoring, review of multiple databases, neurological assessment, discussion with family,  other specialists and medical decision making of high complexity. I spent 35 minutes of neurocritical care time in the care of this patient.   Dorien Bessent,MD   To contact Stroke Continuity provider, please refer to [email or web address] After hours, contact General Neurology

## 2021-09-17 NOTE — Progress Notes (Signed)
Patient is s/p Left M2/MCA mechanical thrombectomy with dr. Karenann Cai this morning.   Patient seen in NICU, laying bed, alert,  NAD. Family members and RN at bedside.   RN and family members state that patient only answers question correctly about half time, he is able to correctly name objects such as glasses,  but not been able to state where he is at. When he answers questions wrong, he repetitively states his birthday.  RN states pt had good strength in exts, able to follow simple commands.   R CFA puncture site c/d/I and soft, pt reports no TTP.  DP 1+ bilaterally.   Further plan per Stroke team, NIR to follow.    Armando Gang Doretta Remmert PA-C 10/06/2021 11:24 AM

## 2021-09-17 NOTE — Progress Notes (Signed)
PT Cancellation Note  Patient Details Name: Casey Pierce. MRN: [id number] DOB: 1956-02-20   Cancelled Treatment:    Reason Eval/Treat Not Completed: Active bedrest order, will attempt later as able once bedrest complete.    Reginia Naas 09/18/2021, 9:14 AM Magda Kiel, PT Acute Rehabilitation Services PBAQV:[phone] [phone] 09/26/2021

## 2021-09-17 NOTE — Progress Notes (Signed)
Rapid Response Event Note  Reason for Call : pt has developed confusion   Initial Focused Assessment: pt able to follow simple commands.  See stroke scale.   Pt unable to read complete sentences.  CBG done.  Last well known time 12MN. Tele neuro activated.  Interventions: Pt to CT scan for code stroke.  Spoke with [place] Crane, MD [facility])  for complete orders.  Code stroke activated.  Plan of Care: Pt to transfer to [facility] for procedure after MD had the most thorough conversation with pt daughter and wife.                                                          Dyann Ruddle, RN

## 2021-09-17 NOTE — Progress Notes (Signed)
On-call cross coverage note  S: Called by RN to see patient for acute neurological change. He was sent down for a STAT CT that reveals new subarachnoid hemorrhage in the left hemisphere and developing extensive hypodensity in the left MCA territory concerning for completed ischemic infarction.  Also possible dense MCA and ACA on the left.  O: BP 131/84   Pulse 99   Temp (!) 97.5 F (36.4 C)   Resp (!) 25   Ht '5\' 5"'$  (1.651 m)   Wt 72.6 kg   SpO2 95%   BMI 26.63 kg/m   On examination, lying in bed with eyes closed, left gaze preference, right facial droop, weakness on the right-much different from prior. Does not follow any commands.  Nonverbal-only moaning.  CT head reviewed-as above.  Assessment: 66 year old man with permanent A-fib on Eliquis which was held due to possible GI bleed, pancreatic cancer with possible mets to the liver, who developed sudden onset of aphasia overnight during his admission for admitted possible SBP, possible upper GI bleeding requiring transfusions, lactic acidosis, leukocytosis hyponatremia, anemia of chronic disease and thrombocytopenia. Vessel imaging had shown a left M2 occlusion. Decision was made with family consent to proceed to mechanical thrombectomy.  Was doing reasonably well this morning per the neurology note but at 6 PM had a neuro change for which she was taken for a CT that showed a large evolving left MCA stroke as well as subarachnoid hemorrhage, hemorrhagic transformation in the stroke area. I discussed these findings in detail with the family.  There is also concern that he is reoccluded his MCA and now left ACA as well based on the hyperdense vessels on the CT.  At this time, given already hemorrhagic transformation and subarachnoid hemorrhage from revascularization, I do not think he is a candidate for thrombectomy again. At this point, detailed discussion with family that given his comorbidities, would recommend focusing only on comfort.   Daughter who is a Marine scientist agrees. He is a DNR status now. Most likely will transition to comfort measures/hospice.  Recommendations: -Continue supportive medical management -Focus on comfort -No need for repeat CT but would like to get an MRI to get a good sense of the extent of the stroke -Palliative consultation first thing in the morning and possible transition to comfort measures only either tonight or tomorrow morning.  Had bedside discussion with wife, mother, stepfather and daughters and answered their questions.  Additional 30 minutes of critical care time  -- Amie [place], MD Neurologist [facility] Pager: [phone]

## 2021-09-17 NOTE — Progress Notes (Signed)
eLink Physician-Brief Progress Note Patient Name: Casey Pierce. DOB: December 16, 1955 MRN: [id number]   Date of Service  10/05/2021  HPI/Events of Note  Received request to change route of metoprolol.   66/M with CVA with hemorrhagic conversion, atrial fibrillation with intermittent RVR and started on metoprolol earlier today.  Pt is NPO at this time.. BP 142/98, HR 97, RR 30, O2 sats 97%.  eICU Interventions  Lopressor 2.'5mg'$  IV q6hrs ordered with holding parameters placed.     Intervention Category Intermediate Interventions: Medication change / dose adjustment  Elsie Lincoln 09/25/2021, 9:42 PM

## 2021-09-17 NOTE — Consult Note (Signed)
NAME:  Patty Lopezgarcia., MRN:  [id number], DOB:  09-28-1955, LOS: 5 ADMISSION DATE:  08/18/2021, CONSULTATION DATE:  09/16/2021 REFERRING MD:  Rory Percy, CHIEF COMPLAINT:  Stroke   History of Present Illness:  66 year old man who was at [facility] for workup for fevers, N/V, anemia in setting of pancreatic cancer post chemo.  Workup (EGD, CT imaging, paracentesis) revealed hemorrhagic duodenitis, new liver lesions concerning for mets, SBP.    Was on [facility] for afib but this was held due to anemia.  Unfortunately early this am developed aphasia found to have L M2 occlusion taken for thrombectomy in early am with technical success.  PCCM consulted to help manage myriad of medical comorbidities.  Currently no complaints, expressive aphasia is improving, some lingering R sided weakness.  Pertinent  Medical History  Afib CAD 1b pancreatic cancer  Significant Hospital Events: Including procedures, antibiotic start and stop dates in addition to other pertinent events   6/28 EGD duodenitis 6/30 liver biopsy and paracentesis 7/2 cerebral angiogram with thrombectomy  Interim History / Subjective:  Consulted  Objective   Blood pressure (!) 127/97, pulse 91, temperature 98 F (36.7 C), resp. rate (!) 23, height _0  (1.651 m), weight 72.6 kg, SpO2 96 %.        Intake/Output Summary (Last 24 hours) at 09/28/2021 1127 Last data filed at 10/06/2021 0800 Gross per 24 hour  Intake 600 ml  Output 15 ml  Net 585 ml   Filed Weights   08/26/2021 1209 09/14/21 1528 09/16/21 0417  Weight: 72.6 kg 66.1 kg 72.6 kg    Examination: General: No distress laying in bed HENT: MMM, trachea  midline Lungs: clear, no wheezing or accessory muscle use Cardiovascular: irregular, ext warm Abdomen: mildly protuberant, biopsy site CDI, para site CDI Extremities: no edema, minimal arthritic changes Neuro: R>L weakness, speech seems clear to me Skin: pale  Resolved Hospital Problem list   N/A  Assessment & Plan:   Stroke due to holding [facility] in setting of anemia post technically successful angiogram and embolectomy ABLA due to duodenitis Pancreatic ca with concern for local metastases SBP, increasing leukocytosis, question peritoneal carcinomatosis Thrombocytopenia Afib w/ intermittent RVR  - Continue cefepime/metronidazole, f/u para cultures - Hold AC for now, will probably need rechallenge with this prior to DC given his stroke with brief interruption - Daily H/H, PPI - Start PO metoprolol, goal SBP per neuro - Should be stable for transfer by tomorrow, will ask [facility] to take back over starting 7/3 am  Best Practice (right click and "Reselect all SmartList Selections" daily)  Per primary  Labs   CBC: Recent Labs  Lab 09/14/21 0457 09/14/21 2219 09/15/21 0501 09/16/21 0537 09/23/2021 0246 09/29/2021 0724  WBC 14.1*  --  17.1* 21.4* 24.7* 28.6*  NEUTROABS 12.3*  --  15.2* 19.1* 22.2* 25.9*  HGB 7.3* 9.4* 9.7* 9.0* 8.7* 8.8*  HCT 22.9* 27.9* 28.5* 27.0* 25.5* 25.4*  MCV 100.4*  --  96.9 97.8 98.1 96.6  PLT 104*  --  74* 107* 79* 71*    Basic Metabolic Panel: Recent Labs  Lab 08/25/2021 0526 09/14/21 0457 09/15/21 0501 09/16/21 0537 10/05/2021 0246  NA 134* 133* 133* 130* 127*  K 3.9 3.6 4.2 3.2* 3.6  CL 105 105 105 102 100  CO2 23 22 20* 22 20*  GLUCOSE 158* 133* 141* 154* 162*  BUN _1 CREATININE 0.46* 0.48* 0.40* 0.37* 0.54*  CALCIUM 9.2 9.1 9.1 9.2 9.4  MG  --  1.6* 1.9 1.8 1.8  PHOS  --   --   --   --  1.7*   GFR: Estimated Creatinine Clearance: 77.9 mL/min (A) (by C-G formula based on SCr of 0.54 mg/dL (L)). Recent Labs  Lab 08/17/2021 0600 08/29/2021 1630 09/14/2021 2104 09/01/2021 0526 09/15/21 0501 09/16/21 0537 10/07/2021 0246 10/01/2021 0724  PROCALCITON  --  0.16  --   --   --   --   --   --   WBC  --   --   --    < > 17.1* 21.4* 24.7* 28.6*  LATICACIDVEN 2.9* 2.3* 1.3  --   --   --  2.3*  --    < > = values in this interval not displayed.    Liver Function  Tests: Recent Labs  Lab 09/03/2021 0526 09/14/21 0457 09/15/21 0501 09/16/21 0537 10/16/2021 0246  AST 38 27 47* 24 30  ALT 89* 62* 55* 43 38  ALKPHOS 98 84 111 110 108  BILITOT 0.8 0.7 1.2 0.9 1.2  PROT 5.5* 5.0* 5.3* 5.2* 5.2*  ALBUMIN 2.1* 1.9* 2.0* 1.9* 1.9*   No results for input(s): "LIPASE", "AMYLASE" in the last 168 hours. No results for input(s): "AMMONIA" in the last 168 hours.  ABG    Component Value Date/Time   TCO2 28 05/04/2014 0112     Coagulation Profile: Recent Labs  Lab 09/10/21 2300 09/07/2021 0526 10/08/2021 0246  INR 1.5* 1.4* 1.7*    Cardiac Enzymes: No results for input(s): "CKTOTAL", "CKMB", "CKMBINDEX", "TROPONINI" in the last 168 hours.  HbA1C: Hgb A1c MFr Bld  Date/Time Value Ref Range Status  10/01/2021 07:24 AM 5.4 4.8 - 5.6 % Final    Comment:    (NOTE) Pre diabetes:          5.7%-6.4%  Diabetes:              >6.4%  Glycemic control for   <7.0% adults with diabetes   05/04/2014 01:10 AM 6.3 (H) 4.8 - 5.6 % Final    Comment:    (NOTE)         Pre-diabetes: 5.7 - 6.4         Diabetes: >6.4         Glycemic control for adults with diabetes: <7.0     CBG: Recent Labs  Lab 09/11/21 0311 09/16/2021 0102 09/26/2021 0240 09/30/2021 0839  GLUCAP 141* 162* 169* 164*    Review of Systems:    Positive Symptoms in bold:  Constitutional fevers, chills, weight loss, fatigue, anorexia, malaise  Eyes decreased vision, double vision, eye irritation  Ears, Nose, Mouth, Throat sore throat, trouble swallowing, sinus congestion  Cardiovascular chest pain, paroxysmal nocturnal dyspnea, lower ext edema, palpitations   Respiratory SOB, cough, DOE, hemoptysis, wheezing  Gastrointestinal nausea, vomiting, diarrhea  Genitourinary burning with urination, trouble urinating  Musculoskeletal joint aches, joint swelling, back pain  Integumentary  rashes, skin lesions  Neurological focal weakness, focal numbness, trouble speaking, headaches   Psychiatric depression, anxiety, confusion  Endocrine polyuria, polydipsia, cold intolerance, heat intolerance  Hematologic abnormal bruising, abnormal bleeding, unexplained nose bleeds  Allergic/Immunologic recurrent infections, hives, swollen lymph nodes     Past Medical History:  He,  has a past medical history of Atrial fibrillation [facility]), Coronary artery disease, History of echocardiogram, Myocardial infarction [facility]), Pancreatic cancer ([place]), and Stroke ([place]) (04/2014).   Surgical History:   Past Surgical History:  Procedure Laterality Date   BILIARY STENT PLACEMENT  N/A 03/28/2021   Procedure: BILIARY STENT PLACEMENT;  Surgeon: Clarene Essex, MD;  Location: [facility];  Service: Endoscopy;  Laterality: N/A;   BILIARY STENT PLACEMENT N/A 08/06/2021   Procedure: BILIARY STENT PLACEMENT;  Surgeon: Gatha Mayer, MD;  Location: [facility];  Service: Gastroenterology;  Laterality: N/A;   CARDIOVERSION N/A 06/20/2015   Procedure: CARDIOVERSION;  Surgeon: [name], MD;  Location: [facility];  Service: Cardiovascular;  Laterality: N/A;   COLONOSCOPY WITH PROPOFOL N/A 02/07/2015   Procedure: COLONOSCOPY WITH PROPOFOL;  Surgeon: [name], MD;  Location: [facility];  Service: Endoscopy;  Laterality: N/A;   CORONARY STENT PLACEMENT  2001   ERCP N/A 03/28/2021   Procedure: ENDOSCOPIC RETROGRADE CHOLANGIOPANCREATOGRAPHY (ERCP);  Surgeon: Clarene Essex, MD;  [facility];  Service: Endoscopy;  Laterality: N/A;   ERCP N/A 08/06/2021   Procedure: ENDOSCOPIC RETROGRADE CHOLANGIOPANCREATOGRAPHY (ERCP);  Surgeon: Gatha Mayer, MD;  Location: [facility];  Service: Gastroenterology;  Laterality: N/A;   ESOPHAGOGASTRODUODENOSCOPY N/A 03/28/2021   Procedure: ESOPHAGOGASTRODUODENOSCOPY (EGD);  Surgeon: Arta Silence, MD;  Location: [facility];  Service: Gastroenterology;  Laterality: N/A;   ESOPHAGOGASTRODUODENOSCOPY N/A 08/31/2021   Procedure: ESOPHAGOGASTRODUODENOSCOPY  (EGD);  Surgeon: Otis Brace, MD;  Location: [facility];  Service: Gastroenterology;  Laterality: N/A;   EUS N/A 03/28/2021   Procedure: FULL UPPER ENDOSCOPIC ULTRASOUND (EUS) RADIAL;  Surgeon: Arta Silence, MD;  Location: [facility];  Service: Gastroenterology;  Laterality: N/A;   FINE NEEDLE ASPIRATION N/A 03/28/2021   Procedure: FINE NEEDLE ASPIRATION (FNA) LINEAR;  Surgeon: Arta Silence, MD;  [facility];  Service: Gastroenterology;  Laterality: N/A;   KNEE SURGERY  1972   PORTACATH PLACEMENT N/A 04/18/2021   Procedure: PORT PLACEMENT;  Surgeon: Stark Klein, MD;  Location: [place];  Service: General;  Laterality: N/A;   SPHINCTEROTOMY  03/28/2021   Procedure: SPHINCTEROTOMY;  Surgeon: Clarene Essex, MD;  Location: [facility];  Service: Endoscopy;;     Social History:   reports that he has never smoked. He quit smokeless tobacco use about 22 years ago.  His smokeless tobacco use included chew. He reports that he does not drink alcohol and does not use drugs.   Family History:  His family history includes Cancer in his paternal uncle; Cancer (age of onset: 64) in his father; Heart attack in his maternal uncle.   Allergies No Known Allergies   Home Medications  Prior to Admission medications   Medication Sig Start Date End Date Taking? Authorizing Provider  dronabinol (MARINOL) 5 MG capsule Take 1 capsule (5 mg total) by mouth 2 (two) times daily before a meal. Patient taking differently: Take 10 mg by mouth 2 (two) times daily before a meal. 09/01/21  Yes Truitt Merle, MD  ELIQUIS 5 MG TABS tablet TAKE 1 TABLET BY MOUTH TWICE A DAY Patient taking differently: Take 5 mg by mouth 2 (two) times daily. 08/23/21  Yes Deboraha Sprang, MD  famotidine (PEPCID) 20 MG tablet Take 20 mg by mouth at bedtime as needed for heartburn or indigestion.   Yes [provider]  lidocaine-prilocaine (EMLA) cream Apply to affected area once Patient taking  differently: Apply 1 application  topically daily as needed. Apply to affected area 04/11/21  Yes Truitt Merle, MD  Multiple Vitamin (MULTI-VITAMIN DAILY PO) Take 1 tablet by mouth in the morning and at bedtime.   Yes [provider]  Omega-3 Fatty Acids (FISH OIL PO) Take 1 capsule by mouth daily.   Yes [provider]  prochlorperazine (COMPAZINE) 10 MG tablet Take 1 tablet (10 mg total) by mouth every 6 (six) hours as needed (Nausea or vomiting). 08/23/21  Yes Burns, Wandra Feinstein, NP  urea (CARMOL) 20 % lotion Apply 1 Application topically as needed (for rash).   Yes [provider]     Critical care time: N/A

## 2021-09-17 NOTE — Progress Notes (Signed)
Pharmacy Electrolyte Replacement  Recent Labs:  Recent Labs    10/11/2021 0246  K 3.6  MG 1.8  PHOS 1.7*  CREATININE 0.54*    Low Critical Values (K </= 2.5, Phos </= 1, Mg </= 1) Present: None  MD Contacted: n/a - no critical values noted  Plan: KPhos 9mol IV x1 Mag sulfate 2g IV x 1 Recheck with AM labs per protocol   HArturo Morton PharmD, BCPS Please check AMION for all MCorinthcontact numbers Clinical Pharmacist 10/14/2021 8:45 AM

## 2021-09-17 NOTE — Anesthesia Procedure Notes (Signed)
Procedure Name: Intubation Date/Time: 10/05/2021 4:43 AM  Performed by: Josephine Igo, CRNAPre-anesthesia Checklist: Patient identified, Emergency Drugs available, Suction available and Patient being monitored Patient Re-evaluated:Patient Re-evaluated prior to induction Oxygen Delivery Method: Circle system utilized Preoxygenation: Pre-oxygenation with 100% oxygen Induction Type: IV induction Ventilation: Mask ventilation without difficulty Laryngoscope Size: Miller and 2 Grade View: Grade I Tube type: Oral Tube size: 7.5 mm Number of attempts: 1 Airway Equipment and Method: Stylet Placement Confirmation: ETT inserted through vocal cords under direct vision, positive ETCO2 and breath sounds checked- equal and bilateral Secured at: 23 cm Tube secured with: Tape Dental Injury: Teeth and Oropharynx as per pre-operative assessment

## 2021-09-17 NOTE — Anesthesia Postprocedure Evaluation (Signed)
Anesthesia Post Note  Patient: Casey Pierce.  Procedure(s) Performed: IR WITH ANESTHESIA     Patient location during evaluation: PACU Anesthesia Type: General Level of consciousness: awake Pain management: pain level controlled Vital Signs Assessment: post-procedure vital signs reviewed and stable Respiratory status: spontaneous breathing, nonlabored ventilation, respiratory function stable and patient connected to nasal cannula oxygen Cardiovascular status: blood pressure returned to baseline and stable Postop Assessment: no apparent nausea or vomiting Anesthetic complications: no   No notable events documented.  Last Vitals:  Vitals:   10/13/2021 0545 10/11/2021 0600  BP: 124/86 (!) 130/97  Pulse: (!) 104 (!) 101  Resp: (!) 23 (!) 23  Temp:  36.7 C  SpO2: 96% 96%    Last Pain:  Vitals:   10/07/2021 0537  TempSrc:   PainSc: 0-No pain                 Casey Pierce

## 2021-09-17 NOTE — Progress Notes (Signed)
0040 Pt wife came to writer to report pt complaining of shoulder pain. Pt wife also states she took patient to the restroom at about 0000 and he seemed confused. Pt has been A&O X 4. Writer performed neuro assessment. Pt is presenting with expressive aphasia, difficultly following commands and has no physical deficits.   0054 Vital signs and CBG taken. Rapid Response was called and came to bedside to evaluate. Tele Neuro monitoring. Pt taken to CT. Provider notified. New orders received.  0411 Pt transferred to [facility]

## 2021-09-17 NOTE — Progress Notes (Addendum)
0142 code stroke activated 0135 code stroke order (CT time) Mrs1 LKW 0000 (changed to 0100 by [facility]) ROS 0100 per RRT nurse 0225 [facility] paged 0232 back in room 0240 [facility] on camera, NCCT report given 0317 cta read to [facility], left m2 0319 call to carelink 0329 updated janelle RRT with status and will cont to update as I know 0353 on cart, patient is transferring to [facility] for IR 0405 transport in room 0410 carelink leaving with patient 0411 call to janelle to update

## 2021-09-17 NOTE — Transfer of Care (Signed)
Immediate Anesthesia Transfer of Care Note  Patient: Casey Pierce  Procedure(s) Performed: IR WITH ANESTHESIA  Patient Location: PACU  Anesthesia Type:General  Level of Consciousness: awake, alert  and confused  Airway & Oxygen Therapy: Patient Spontanous Breathing and Patient connected to face mask oxygen  Post-op Assessment: Report given to RN and Post -op Vital signs reviewed and stable  Post vital signs: Reviewed and stable  Last Vitals:  Vitals Value Taken Time  BP 113/79(88)   Temp    Pulse 99   Resp    SpO2 95     Last Pain:  Vitals:   09/27/2021 0103  TempSrc: Oral  PainSc:       Patients Stated Pain Goal: 2 ([birth year] 6568)  Complications: No notable events documented.

## 2021-09-17 NOTE — Procedures (Signed)
INTERVENTIONAL NEURORADIOLOGY BRIEF POSTPROCEDURE NOTE  DIAGNOSTIC CEREBRAL ANGIOGRAM AND MECHANICAL THROMBECTOMY  Attending: Dr. Pedro Earls  Diagnosis: Distal left M2 occlusion  Access site: RCFA  Access closure: 8 [place] angioseal  Anesthesia: GETA  Medication used: refer to anesthesia documentation.  Complications: None  Estimated blood loss: 50-80 mL.  Specimen: None.  Findings: Occlusion of the distal left M2/MCA angular branch. Mechanical thrombectomy performed with direct contact aspiration (x2) with recanalization of the M2 segment, but persistent occlusion of the M3 segment. An additional pass performed with stent retriever achieving complete recanalization (TICI3). No evidence of thromboembolic or hemorrhagic complication.  The patient tolerated the procedure well without incident or complication and is in stable condition.    PLA: - Bed rest x6 hours s/p femoral access. - SBP 120-140 mmHg.

## 2021-09-17 NOTE — Evaluation (Signed)
Physical Therapy Re-Evaluation Patient Details Name: Casey Pierce. MRN: [id number] DOB: Aug 22, 1955 Today's Date: 10/11/2021  History of Present Illness  66 yo male admitted with sepsis, ascites, new liver mets. S/P liver biopsy/paracentesis. Hx of Afib, CAD, MI, pancreatic Ca, CVA, transferred from [facility] due to code stroke, now s/p mechanical thrombectomy of L M2/MCA 0n 09/22/2021.  Clinical Impression  Patient presents s/p acute onset aphasia and treatment for L MCA CVA.  He presents with decreased mobility related to mild residual R side weakness and communication/cognitive deficits.  Patient was able to ambulate to bathroom and back to chair and participate some in seated functional task.  Family in the room and supportive.  Feel he will benefit from continued skilled PT in the acute setting and for now continue to recommend HHPT as feel pt will continue to improve.       Recommendations for follow up therapy are one component of a multi-disciplinary discharge planning process, led by the attending physician.  Recommendations may be updated based on patient status, additional functional criteria and insurance authorization.  Follow Up Recommendations Home health PT      Assistance Recommended at Discharge Frequent or constant Supervision/Assistance  Patient can return home with the following  A lot of help with walking and/or transfers;A lot of help with bathing/dressing/bathroom;Assistance with cooking/housework;Direct supervision/assist for medications management;Help with stairs or ramp for entrance    Equipment Recommendations None recommended by PT  Recommendations for Other Services       Functional Status Assessment Patient has had a recent decline in their functional status and demonstrates the ability to make significant improvements in function in a reasonable and predictable amount of time.     Precautions / Restrictions Precautions Precautions: Fall Restrictions Weight  Bearing Restrictions: No      Mobility  Bed Mobility Overal bed mobility: Needs Assistance Bed Mobility: Supine to Sit     Supine to sit: Mod assist, HOB elevated     General bed mobility comments: increased time, assist to initiate and for legs off bed, scooting to EOB    Transfers Overall transfer level: Needs assistance Equipment used: 2 person hand held assist Transfers: Sit to/from Stand Sit to Stand: Mod assist, +2 physical assistance, +2 safety/equipment           General transfer comment: assist for up off EOB and from toilet with HHA of 2    Ambulation/Gait Ambulation/Gait assistance: Min assist, +2 physical assistance Gait Distance (Feet): 12 Feet (x 2) Assistive device: 2 person hand held assist Gait Pattern/deviations: Step-through pattern, Decreased stride length       General Gait Details: assist to bathroom and back to recliner with RN assisting; HHA for balance, safety, initiation and generalized weakness  Stairs            Wheelchair Mobility    Modified Rankin (Stroke Patients Only) Modified Rankin (Stroke Patients Only) Pre-Morbid Rankin Score: Moderately severe disability Modified Rankin: Moderately severe disability     Balance Overall balance assessment: Needs assistance Sitting-balance support: Feet supported Sitting balance-Leahy Scale: Fair Sitting balance - Comments: on EOB and on toilet without UE support   Standing balance support: Bilateral upper extremity supported Standing balance-Leahy Scale: Poor Standing balance comment: UE support for balance                             Pertinent Vitals/Pain Pain Assessment Pain Assessment: No/denies pain    Home  Living Family/patient expects to be discharged to:: Private residence Living Arrangements: Spouse/significant other Available Help at Discharge: Family Type of Home: House Home Access: Level entry     Alternate Level Stairs-Number of Steps: 3 down to  den? Home Layout: Multi-level Home Equipment: None      Prior Function Prior Level of Function : Independent/Modified Independent                     Hand Dominance   Dominant Hand: Right    Extremity/Trunk Assessment   Upper Extremity Assessment Upper Extremity Assessment: Defer to OT evaluation RUE Deficits / Details: RUE 3/5 and much weaker than L, family states he has residual paraesthesias and dexterity limitations from a CVA 7 years ago RUE Sensation: decreased light touch RUE Coordination: decreased fine motor LUE Deficits / Details: overall 4/5 with full ROM LUE Sensation: decreased light touch LUE Coordination: decreased fine motor;decreased gross motor    Lower Extremity Assessment Lower Extremity Assessment: RLE deficits/detail RLE Deficits / Details: mild weakness compared to L    Cervical / Trunk Assessment Cervical / Trunk Assessment: Kyphotic  Communication   Communication: No difficulties  Cognition Arousal/Alertness: Awake/alert Behavior During Therapy: Flat affect Overall Cognitive Status: Difficult to assess                                 General Comments: able to state name, birthday and "[place]" as location. Identified a toothbrush but not a pen. Intermittently following 1 step commands, requires increased time and cues. difficult to decipher between impaired comminication and cognition        General Comments General comments (skin integrity, edema, etc.): VSS on RA, patient in bathroom as condom cath had come off, and RN assisted wtih bath while on toilet in bathroom pt able to wash his face and assist with perineal care in standing with A and mod cues and time    Exercises     Assessment/Plan    PT Assessment Patient needs continued PT services  PT Problem List Decreased strength;Decreased balance;Decreased activity tolerance;Decreased mobility;Decreased safety awareness;Decreased cognition       PT Treatment  Interventions DME instruction;Therapeutic activities;Gait training;Therapeutic exercise;Patient/family education;Cognitive remediation;Balance training;Functional mobility training    PT Goals (Current goals can be found in the Care Plan section)  Acute Rehab PT Goals Patient Stated Goal: home soon; regain strength PT Goal Formulation: With patient/family Time For Goal Achievement: 10/01/21 Potential to Achieve Goals: Good    Frequency Min 4X/week     Co-evaluation               AM-PAC PT "6 Clicks" Mobility  Outcome Measure Help needed turning from your back to your side while in a flat bed without using bedrails?: A Lot Help needed moving from lying on your back to sitting on the side of a flat bed without using bedrails?: A Lot Help needed moving to and from a bed to a chair (including a wheelchair)?: A Lot Help needed standing up from a chair using your arms (e.g., wheelchair or bedside chair)?: A Lot Help needed to walk in hospital room?: Total Help needed climbing 3-5 steps with a railing? : Total 6 Click Score: 10    End of Session Equipment Utilized During Treatment: Gait belt Activity Tolerance: Patient limited by fatigue Patient left: in chair;with call bell/phone within reach;with family/visitor present;with nursing/sitter in room Nurse Communication: Mobility status PT Visit  Diagnosis: Muscle weakness (generalized) (M62.81);Unsteadiness on feet (R26.81);Other symptoms and signs involving the nervous system (R29.898)    Time: 1583-0940 PT Time Calculation (min) (ACUTE ONLY): 26 min   Charges:   PT Evaluation $PT Re-evaluation: 1 Re-eval PT Treatments $Gait Training: 8-22 mins        Magda Kiel, PT Acute Rehabilitation Services [phone] 10/08/2021   Reginia Naas 10/16/2021, 3:01 PM

## 2021-09-18 ENCOUNTER — Inpatient Hospital Stay (HOSPITAL_COMMUNITY): Payer: Medicare PPO

## 2021-09-18 ENCOUNTER — Encounter (HOSPITAL_COMMUNITY): Payer: Self-pay | Admitting: Radiology

## 2021-09-18 DIAGNOSIS — A419 Sepsis, unspecified organism: Secondary | ICD-10-CM | POA: Diagnosis not present

## 2021-09-18 DIAGNOSIS — I639 Cerebral infarction, unspecified: Secondary | ICD-10-CM | POA: Diagnosis not present

## 2021-09-18 LAB — CBC WITH DIFFERENTIAL/PLATELET
Abs Immature Granulocytes: 0.4 10*3/uL — ABNORMAL HIGH (ref 0.00–0.07)
Basophils Absolute: 0.1 10*3/uL (ref 0.0–0.1)
Basophils Relative: 0 %
Eosinophils Absolute: 0 10*3/uL (ref 0.0–0.5)
Eosinophils Relative: 0 %
HCT: 26.6 % — ABNORMAL LOW (ref 39.0–52.0)
Hemoglobin: 9 g/dL — ABNORMAL LOW (ref 13.0–17.0)
Immature Granulocytes: 1 %
Lymphocytes Relative: 2 %
Lymphs Abs: 0.8 10*3/uL (ref 0.7–4.0)
MCH: 32.6 pg (ref 26.0–34.0)
MCHC: 33.8 g/dL (ref 30.0–36.0)
MCV: 96.4 fL (ref 80.0–100.0)
Monocytes Absolute: 1.4 10*3/uL — ABNORMAL HIGH (ref 0.1–1.0)
Monocytes Relative: 4 %
Neutro Abs: 29.3 10*3/uL — ABNORMAL HIGH (ref 1.7–7.7)
Neutrophils Relative %: 93 %
Platelets: 58 10*3/uL — ABNORMAL LOW (ref 150–400)
RBC: 2.76 MIL/uL — ABNORMAL LOW (ref 4.22–5.81)
RDW: 16.4 % — ABNORMAL HIGH (ref 11.5–15.5)
WBC: 31.9 10*3/uL — ABNORMAL HIGH (ref 4.0–10.5)
nRBC: 0 % (ref 0.0–0.2)

## 2021-09-18 LAB — BASIC METABOLIC PANEL
Anion gap: 8 (ref 5–15)
BUN: 12 mg/dL (ref 8–23)
CO2: 21 mmol/L — ABNORMAL LOW (ref 22–32)
Calcium: 10.1 mg/dL (ref 8.9–10.3)
Chloride: 100 mmol/L (ref 98–111)
Creatinine, Ser: 0.66 mg/dL (ref 0.61–1.24)
GFR, Estimated: >60 mL/min (ref >60)
Glucose, Bld: 183 mg/dL — ABNORMAL HIGH (ref 70–99)
Potassium: 4.3 mmol/L (ref 3.5–5.1)
Sodium: 129 mmol/L — ABNORMAL LOW (ref 135–145)

## 2021-09-18 LAB — PATHOLOGIST SMEAR REVIEW

## 2021-09-18 LAB — PHOSPHORUS: Phosphorus: 2.9 mg/dL (ref 2.5–4.6)

## 2021-09-18 LAB — MAGNESIUM: Magnesium: 2.1 mg/dL (ref 1.7–2.4)

## 2021-09-18 MED ORDER — MORPHINE 100MG IN NS 100ML (1MG/ML) PREMIX INFUSION
0.0000 mg/h | INTRAVENOUS | Status: DC
  Administered 2021-09-18: 5 mg/h via INTRAVENOUS
  Administered 2021-09-18: 10 mg/h via INTRAVENOUS
  Administered 2021-09-19 (×2): 15 mg/h via INTRAVENOUS
  Administered 2021-09-19: 5 mg/h via INTRAVENOUS
  Filled 2021-09-18 (×5): qty 100

## 2021-09-18 MED ORDER — GLYCOPYRROLATE 1 MG PO TABS: 1.0000 mg | ORAL_TABLET | ORAL | Status: DC | PRN

## 2021-09-18 MED ORDER — SODIUM CHLORIDE 0.9% FLUSH
10.0000 mL | Freq: Two times a day (BID) | INTRAVENOUS | Status: DC
  Administered 2021-09-18 (×2): 10 mL

## 2021-09-18 MED ORDER — POLYVINYL ALCOHOL 1.4 % OP SOLN: 1.0000 [drp] | Freq: Four times a day (QID) | OPHTHALMIC | Status: DC | PRN

## 2021-09-18 MED ORDER — MORPHINE SULFATE (PF) 2 MG/ML IV SOLN
2.0000 mg | INTRAVENOUS | Status: DC | PRN
  Administered 2021-09-18 (×2): 2 mg via INTRAVENOUS
  Filled 2021-09-18 (×2): qty 1

## 2021-09-18 MED ORDER — ACETAMINOPHEN 325 MG PO TABS: 650.0000 mg | ORAL_TABLET | Freq: Four times a day (QID) | ORAL | Status: DC | PRN

## 2021-09-18 MED ORDER — GLYCOPYRROLATE 0.2 MG/ML IJ SOLN
0.2000 mg | INTRAMUSCULAR | Status: DC | PRN
  Administered 2021-09-18 – 2021-09-19 (×4): 0.2 mg via INTRAVENOUS
  Filled 2021-09-18 (×4): qty 1

## 2021-09-18 MED ORDER — SODIUM CHLORIDE 0.9 % IV SOLN: INTRAVENOUS | Status: DC

## 2021-09-18 MED ORDER — MORPHINE BOLUS VIA INFUSION
5.0000 mg | INTRAVENOUS | Status: DC | PRN
  Administered 2021-09-19: 5 mg via INTRAVENOUS

## 2021-09-18 MED ORDER — GLYCOPYRROLATE 0.2 MG/ML IJ SOLN: 0.2000 mg | INTRAMUSCULAR | Status: DC | PRN

## 2021-09-18 MED ORDER — SODIUM CHLORIDE 0.9% FLUSH: 10.0000 mL | INTRAVENOUS | Status: DC | PRN

## 2021-09-18 MED ORDER — ACETAMINOPHEN 650 MG RE SUPP: 650.0000 mg | Freq: Four times a day (QID) | RECTAL | Status: DC | PRN

## 2021-09-18 NOTE — Progress Notes (Signed)
MR brain completed. Large Left MCA infarct as well as scattered embolic strokes in other vascular territories as well. Early shift L to R.  Stable SAH and HT as seen on CT. Loss of flow voids in the intracranial LICA, LMCA and LACA A1 segment. Patchy left ACA terriotry and other strokes in the right hemisphere and posterior circulation. These strokes are definitely going to be disabling maybe fatal and family is leaning towards comfort measures hence hypertonics are not of much use. Discussed with family - wife, and daughters PCCM prescribed morphine for obvious discomfort that the patient was in after MRI. He is now more sleepy and does not appear uncomfortable. Possible transition to full comfort measures in the AM. Would appreciate prompt PMT Consult in the AM. Placed in the chart - will advise teams to call in the AM. Remains DNR.  -- Amie Portland, MD Neurologist [facility] Pager: [phone]  Additional 20 min CC time.

## 2021-09-18 NOTE — Progress Notes (Signed)
eLink Physician-Brief Progress Note Patient Name: Casey Pierce. DOB: 1955-05-02 MRN: [id number]   Date of Service  09/18/2021  HPI/Events of Note  Pt just returned from MRI and appeared uncomfortable as per RN.    66/M with pancreatic CA with mets, atrial fibrillation off anticoagulation with new CVA s/p mechanical thrombectomy but with hemorrhagic transformation and SAH.   Family and RN requesting for pain medication.   eICU Interventions  Morphine IV PRN ordered.     Intervention Category Intermediate Interventions: Pain - evaluation and management  Elsie Lincoln 09/18/2021, 3:01 AM

## 2021-09-18 NOTE — Progress Notes (Signed)
STROKE TEAM PROGRESS NOTE   INTERVAL HISTORY His family is at the bedside.   Family has decided to make him DNR and comfort care measures only.  He is resting peacefully on morphine drip and exam is deferred.  MRI scan shows large almost entire left MCA distribution infarct with cytotoxic edema with 2 mm left-to-right shift along with some small patchy infarcts left ACA, right PCA and right cerebellum.  Small amount of localized subarachnoid hemorrhage in the left sylvian fissure.  Vitals:   09/18/21 0500 09/18/21 0600 09/18/21 0738 09/18/21 0800  BP: 131/85 131/73  129/81  Pulse: (!) 105 95  95  Resp: (!) 24 (!) 24  15  Temp:   (!) 100.4 F (38 C)   TempSrc:   Axillary   SpO2: 97% 97%  97%  Weight:      Height:       CBC:  Recent Labs  Lab 10/14/2021 0724 09/18/21 0113  WBC 28.6* 31.9*  NEUTROABS 25.9* 29.3*  HGB 8.8* 9.0*  HCT 25.4* 26.6*  MCV 96.6 96.4  PLT 71* 58*   Basic Metabolic Panel:  Recent Labs  Lab 09/21/2021 0246 09/18/21 0113  NA 127* 129*  K 3.6 4.3  CL 100 100  CO2 20* 21*  GLUCOSE 162* 183*  BUN 10 12  CREATININE 0.54* 0.66  CALCIUM 9.4 10.1  MG 1.8 2.1  PHOS 1.7* 2.9   Lipid Panel:  Recent Labs  Lab 09/21/2021 0724  CHOL 62  TRIG 94  HDL 12*  CHOLHDL 5.2  VLDL 19  LDLCALC 31   HgbA1c:  Recent Labs  Lab 10/07/2021 0724  HGBA1C 5.4   Urine Drug Screen: No results for input(s): "LABOPIA", "COCAINSCRNUR", "LABBENZ", "AMPHETMU", "THCU", "LABBARB" in the last 168 hours.  Alcohol Level No results for input(s): "ETH" in the last 168 hours.  IMAGING past 24 hours MR BRAIN WO CONTRAST  Result Date: 09/18/2021 CLINICAL DATA:  Follow-up examination for stroke, status post mechanical thrombectomy. EXAM: MRI HEAD WITHOUT CONTRAST TECHNIQUE: Multiplanar, multiecho pulse sequences of the brain and surrounding structures were obtained without intravenous contrast. COMPARISON:  Comparison made with multiple previous examinations from 10/13/2021. FINDINGS:  Brain: Extensive confluent restricted diffusion seen throughout the left MCA territory, essentially involving the entirety of the left MCA distribution, consistent with large ischemic infarct. Involvement of the left basal ganglia including the left caudate and lentiform nuclei as well as the intervening left internal capsule. Additional scattered patchy involvement in ischemic changes noted within the left ACA territory as well. Superimposed subarachnoid hemorrhage involving the left sylvian fissure and overlying left frontal parietal region, similar to prior CT. Small volume petechial hemorrhage noted at the posterior left parieto-occipital region as well (series 14, image 35) Heidelberg classification 3c: Subarachnoid hemorrhage. Additionally, scattered patchy acute ischemic cortical and subcortical infarcts seen involving the contralateral right cerebral hemisphere, primarily involving the right ACA distribution. Patchy involvement of the anterior genu of the corpus callosum. Few additional patchy foci in noted laterally within right frontal MCA territory as well. Additionally, patchy ischemic changes about the left greater than right occipital poles, consistent with PCA territory infarcts (series 5, image 71). Additionally, multifocal patchy ischemic infarcts noted involving the right cerebellum (series 14, image 31), with the largest area of ischemia measuring up to 1.9 cm. Minimal petechial blood products noted at this location as well (series 14, image 15). Few additional subcentimeter foci of diffusion abnormality within the contralateral left cerebellum are somewhat less dense, and could reflect acute  to early subacute infarcts (series 5, images 56, 75) Extensive gyral swelling and edema about the large evolving left MCA territory infarct. Associated mass effect with early/mild 2 mm left-to-right shift. No hydrocephalus or trapping. Basilar cisterns remain patent. Underlying cerebral volume within normal  limits. No mass lesion or extra-axial fluid collection. Pituitary gland suprasellar region within normal limits. Vascular: Loss of normal flow void within the left ICA to the terminus, consistent with interval occlusion (series 11, image 8). Absent flow voids within the left MCA territory, also consistent with occlusion. Left A1 segment also appears occluded. Remainder of the major intracranial vascular flow voids are otherwise maintained, although the hypoplastic left vertebral arteries not well seen. Skull and upper cervical spine: Craniocervical junction within normal limits. Diffusely decreased T1 signal intensity throughout the visualized bone marrow, likely related to patient's history of malignancy and therapy. No focal marrow replacing lesion. No scalp soft tissue abnormality. Sinuses/Orbits: Globes and orbital soft tissues within normal limits. Mild scattered sphenoid ethmoidal and maxillary sinus disease. Trace right mastoid effusion noted. Other: None. IMPRESSION: 1. Large evolving left MCA distribution infarct, essentially involving the entirety of the left MCA territory. Developing regional mass effect with early 2 mm of left-to-right shift. 2. Superimposed subarachnoid hemorrhage centered about the left sylvian fissure, with additional scattered small volume petechial blood products as above. 3. Loss of normal flow voids within the left ICA, left MCA, and left A1 segment, compatible with interval occlusion. 4. Additional patchy acute ischemic infarcts involving the left ACA territory, contralateral right cerebral hemisphere, left greater than right PCA distributions, and right cerebellum. 5. Few additional subcentimeter acute to early subacute ischemic infarcts involving the left cerebellum. Electronically Signed   By: Jeannine Boga M.D.   On: 09/18/2021 03:14   CT HEAD WO CONTRAST (5MM)  Result Date: 09/20/2021 CLINICAL DATA:  Status post reperfusion of occluded left M2 segment. EXAM: CT  HEAD WITHOUT CONTRAST TECHNIQUE: Contiguous axial images were obtained from the base of the skull through the vertex without intravenous contrast. RADIATION DOSE REDUCTION: This exam was performed according to the departmental dose-optimization program which includes automated exposure control, adjustment of the mA and/or kV according to patient size and/or use of iterative reconstruction technique. COMPARISON:  CT head without contrast 09/28/2021 at 2 a.m. CT images from IR suite at 5:13 a.m. FINDINGS: Brain: New subarachnoid hemorrhage is present within the left sylvian fissure. The left lentiform nucleus is no longer visualized. Insular ribbon is hypodense. Early loss of gray-white differentiation present in the posterior left MCA territory. The ventricles are of normal size. The brainstem and cerebellum are within normal limits. Vascular: Asymmetric hyperdense left A1 and M1 segments noted. Skull: Calvarium is intact. No focal lytic or blastic lesions are present. No significant extracranial soft tissue lesion is present. Sinuses/Orbits: A polyp or mucous retention cyst is present the right maxillary sinus. The paranasal sinuses and mastoid air cells are otherwise clear. The globes and orbits are within normal limits. IMPRESSION: 1. New subarachnoid hemorrhage within the left Sylvian fissure. 2. Left lentiform nucleus no longer visualized suggesting infarct. 3. Early loss of gray-white differentiation in the posterior left MCA territory also likely represents infarct. 4. Asymmetric hyperdense left A1 and M1 segments. Question reocclusion of the left ACA and MCA vessels. Critical Value/emergent results were called by telephone at the time of interpretation on 10/12/2021 at 7:17 pm to provider Dr. Rory Percy, who verbally acknowledged these results. Electronically Signed   By: San Morelle M.D.   On:  09/30/2021 19:21    PHYSICAL EXAM  Physical Exam  Constitutional: Appears well-developed and well-nourished.    Cardiovascular: Normal rate and regular rhythm.  Respiratory: Effort normal, non-labored breathing  Neuro: Mental Status: Patient is awake, alert with some expressive aphasia He is able to identify people in the room, he perseverates on these answers when asked additional questions.  Naming and repetition intact. Cranial Nerves: II: Visual Fields are full. Pupils are equal, round, and reactive to light.   III,IV, VI: EOMI without ptosis or diploplia.  V: Facial sensation is symmetric to temperature VII: Facial movement is symmetric resting and smiling VIII: Hearing is intact to voice X: Palate elevates symmetrically XI: Shoulder shrug is symmetric. XII: Tongue protrudes midline without atrophy or fasciculations.  Motor: Tone is normal. Bulk is normal.  RUE 4/5 LUE 5/5 RLE 4/5 LLE 5/5 Sensory: Sensation is symmetric to light touch and temperature in the arms and legs. No extinction to DSS present.  Deep Tendon Reflexes: 2+ and symmetric in the biceps and patellae.  Plantars: Toes are downgoing bilaterally.  Cerebellar: FNF and HKS are intact bilaterally    ASSESSMENT/PLAN Casey Pierce. is a 66 y.o. male with history of atrial fibrillation on anticoagulation which was held during this admission due to concern for GI bleed, pancreatic cancer recently completed chemotherapy with possible metastasis to the liver status postbiopsy pending results, admitted to [facility] long hospital since 08/21/2021 when he complained of fevers, generalized fatigue poor appetite and nausea.  GI and oncological work-up was ongoing for mets and possible SBP along with symptomatic anemia requiring transfusion.  Last known well midnight and around 1 AM noted to be "confused".  Speech was difficult to comprehend and had a word salad.  Code stroke was activated.  Telemedicine neurology evaluated the patient and found him to be severely aphasic.  He was unable to follow any commands.  Stroke:  left M2  occlusion s/p mechanical thrombectomy with TICI3 revascularization Etiology:  Cardio embolic in the setting of atrial fibrillation on eliquis (held d/t concern for GI bleed)  Code Stroke CT head No acute abnormality. Small vessel disease. Atrophy. ASPECTS 10.    CTA head & neck left M2 occlusion, bilateral pleural effusions and atelectasis CT perfusion 40 cc perfusion deficit, core volume 0 Post IR CT no hemorrhagic transformation MRI large left MCA distribution infarct involving it entire territory.  Developing regional mass effect with 2 mm left-to-right shift.  Small subarachnoid hemorrhage centered around left sylvian fissure.  Additional small patchy infarcts in the left ACA, contralateral right cerebral hemisphere, right PCA and right cerebellum. 2D Echo EF 50-55% LDL No results found for requested labs within last 1095 days. HgbA1c 5.4 VTE prophylaxis -SCDs    Diet   Diet full liquid Room service appropriate? Yes; Fluid consistency: Thin   Eliquis (apixaban) daily prior to admission, now on No antithrombotic.  Initially held due to anemia and in anticipation of procedure Therapy recommendations: Pending Disposition: Pending  Atrial fibrillation Home med: Eliquis- on hold due to anemia Rate control with IVP lopressor until SLP   Hypertension Home meds: None Stable Permissive hypertension (OK if < 220/120) but gradually normalize in 5-7 days Long-term BP goal normotensive Cleviprex ordered if needed  Hyperlipidemia Home meds: None LDL No results found for requested labs within last 1095 days., goal < 70 Continue statin at discharge  Leukocytosis WBC- 24.7 CCM to manage Cefepime and Flagyl ordered  Other Stroke Risk Factors Advanced Age >/= 61  Obesity, Body mass index is 27.62 kg/m., BMI >/= 30 associated with increased stroke risk, recommend weight loss, diet and exercise as appropriate   Other Active Problems Pancreatic cancer with questionable mets to the  liver Thrombocytopenia  Hospital day # 6  Unfortunate patient with pancreatic cancer with liver metastasis and possible SBP and recent GI bleed and A-fib with Eliquis being held for recent GI bleed presented with sudden onset of aphasia and right hemiparesis due to left M2 occlusion underwent thrombectomy with unfortunately is not doing well and survival without significant deficits would not be possible and family opted to consultation has made him DNR and comfort care.  Is resting peacefully on morphine drip at the present time.  Multiple family members at the bedside.  They were given opportunity to ask questions which were answered.  Continue comfort care measures.  Stroke team will sign off.  Kindly call for questions.  Greater than 50% time during this 35-minute visit was spent in counseling and coordination of care and discussion patient with care team and family and answering questions about his stroke and pancreatic cancer.Antony Contras MD To contact Stroke Continuity provider, please refer to [email or web address] After hours, contact General Neurology

## 2021-09-18 NOTE — Progress Notes (Signed)
Interventional Radiology Brief Note:  Patient transitioned to comfort care after stroke progression yesterday.  Anticipated hospital death. NIR remains available if needed.  Brynda Greathouse, MS RD PA-C

## 2021-09-18 NOTE — Progress Notes (Signed)
PT Cancellation Note  Patient Details Name: Casey Pierce. MRN: [id number] DOB: 08/25/1955   Cancelled Treatment:    Reason Eval/Treat Not Completed: Other (comment); events noted.  PT will sign off.    Reginia Naas 09/18/2021, 9:22 AM Magda Kiel, PT Acute Rehabilitation Services Office:[phone] 09/18/2021

## 2021-09-18 NOTE — Progress Notes (Signed)
09/18/2021 Events overnight noted. Transformed/completed L MCA stroke  S:  Unresponsive/moaning in bed Not responding to commands nor talking  O: Blood pressure 131/73, pulse 95, temperature 100 F (37.8 C), temperature source Axillary, resp. rate (!) 24, height '5\' 5"'$  (1.651 m), weight 75.3 kg, SpO2 97 %.  Moaning intermittently Dense R hemiplegia Pupils equal Ext without edema Lungs clear  A:  L MCA stroke Pancreatic cancer now metastasized  P:  Transition to inpatient comfort care Liberalize visitation In hospital death expected Family updated, condolences offered  Will be available PRN  Erskine Emery MD [facility] Prefer epic messenger for cross cover needs If after hours, please call E-link

## 2021-09-18 NOTE — Progress Notes (Signed)
  Transition of Care [facility]) Screening Note   Patient Details  Name: Casey Pierce. Date of Birth: 14-Mar-1956   Transition of Care [facility]) CM/SW Contact:    Benard Halsted, LCSW Phone Number: 09/18/2021, 4:31 PM    Transition of Care Department [facility]) has reviewed patient who has transferred to [facility] from [facility]. We will continue to monitor patient advancement through interdisciplinary progression rounds as therapy needs are currently present. If new patient transition needs arise, please place a TOC consult.

## 2021-09-19 MED ORDER — ATROPINE SULFATE 1 MG/10ML IJ SOSY: 1.0000 mg | PREFILLED_SYRINGE | Freq: Once | INTRAMUSCULAR | Status: DC

## 2021-09-19 MED ORDER — SCOPOLAMINE 1 MG/3DAYS TD PT72
1.0000 | MEDICATED_PATCH | TRANSDERMAL | Status: DC
  Administered 2021-09-19: 1.5 mg via TRANSDERMAL
  Filled 2021-09-19: qty 1

## 2021-09-19 MED ORDER — LORAZEPAM 2 MG/ML IJ SOLN
2.0000 mg | INTRAMUSCULAR | Status: DC | PRN
  Administered 2021-09-19: 2 mg via INTRAVENOUS
  Filled 2021-09-19: qty 1

## 2021-09-19 MED ORDER — ATROPINE SULFATE 1 % OP SOLN: 4.0000 [drp] | OPHTHALMIC | Status: DC | PRN

## 2021-09-20 ENCOUNTER — Inpatient Hospital Stay: Admit: 2021-09-20 | Payer: Medicare PPO | Admitting: General Surgery

## 2021-09-20 LAB — CULTURE, BODY FLUID W GRAM STAIN -BOTTLE: Culture: NO GROWTH

## 2021-09-20 LAB — SURGICAL PATHOLOGY

## 2021-09-20 LAB — CYTOLOGY - NON PAP

## 2021-09-20 SURGERY — LAPAROSCOPY, DIAGNOSTIC
Anesthesia: General

## 2021-10-17 ENCOUNTER — Ambulatory Visit: Payer: Medicare PPO | Admitting: Hematology

## 2021-10-17 ENCOUNTER — Other Ambulatory Visit: Payer: Medicare PPO

## 2021-10-17 NOTE — Death Summary Note (Signed)
DEATH SUMMARY   Patient Details  Name: Casey Pierce. MRN: [id number] DOB: 04-25-1955  Admission/Discharge Information   Admit Date:  2021-10-01  Date of Death: Date of Death: 10/08/21  Time of Death: Time of Death: 1025-05-16  Length of Stay: 05-14-22  Referring Physician: Lavone Orn, MD   Reason(s) for Hospitalization  Left MCA stroke with hemorrhagic conversion Pancreatic cancer, metastatic ABLA secondary to duodenitis Afib with RVR SBP vs. Peritoneal carcinomatosis  Brief Hospital Course (including significant findings, care, treatment, and services provided and events leading to death)  66 year old man who was at [facility] for workup for fevers, N/V, anemia in setting of pancreatic cancer post chemo.  Workup (EGD, CT imaging, paracentesis) revealed hemorrhagic duodenitis, new liver lesions concerning for mets, SBP.     Was on [facility] for afib but this was held due to anemia.  Unfortunately early this am developed aphasia found to have L M2 occlusion taken for thrombectomy in early am with technical success.  PCCM consulted to help manage myriad of medical comorbidities.   He was okay for about 20 hours post procedure then had sudden reduction in consciousness.  Stat head CT showed completed MCA stroke with hemorrhagic conversion.  Family meeting held and patient transitioned to full comfort care.  Pertinent Labs and Studies  Significant Diagnostic Studies IR PERCUTANEOUS ART THROMBECTOMY/INFUSION INTRACRANIAL INC DIAG ANGIO  Result Date: 09/20/2021 INDICATION: Casey Uno. is a 66 year old male with past medical history significant for atrial fibrillation on anticoagulation which was held during current admission due to concern for GI bleed requiring transfusion, pancreatic cancer recently completed chemotherapy with possible metastasis to the liver status post biopsy pending results, admitted to [facility] on 10-01-2021 when he complained of fevers, generalized fatigue poor  appetite and nausea. Patient headache acute onset of confusion and aphasia; NIHSS 11. Last known well at 1 a.m. on 09/26/2021. No thrombo lytics were administered given recent GI bleed and transfusions. Head CT showed no large acute territorial core or hemorrhage. CT angiogram of the head and neck was positive for left M2/MCA occlusion. EXAM: ULTRASOUND-GUIDED VASCULAR ACCESS DIAGNOSTIC CEREBRAL ANGIOGRAM MECHANICAL THROMBECTOMY FLAT PANEL HEAD CT COMPARISON:  CT/CT angiogram of the head and neck September 17, 2021. MEDICATIONS: No antibiotics administered. ANESTHESIA/SEDATION: The procedure was performed under general anesthesia. CONTRAST:  50 mL Omnipaque 300 milligram/mL FLUOROSCOPY: Radiation Exposure Index (as provided by the fluoroscopic device): 572 mGy Kerma COMPLICATIONS: None immediate. TECHNIQUE: Informed written consent was obtained from the patient's wife after a thorough discussion of the procedural risks, benefits and alternatives. All questions were addressed. Maximal Sterile Barrier Technique was utilized including caps, mask, sterile gowns, sterile gloves, sterile drape, hand hygiene and skin antiseptic. A timeout was performed prior to the initiation of the procedure. The right groin was prepped and draped in the usual sterile fashion. Using a micropuncture kit and the modified Seldinger technique, access was gained to the right common femoral artery and an 8 French sheath was placed. Real-time ultrasound guidance was utilized for vascular access including the acquisition of a permanent ultrasound image documenting patency of the accessed vessel. Under fluoroscopy, a Zoom 88 guide catheter was navigated over a 6 [place] Berenstein 2 catheter and a 0.035" Terumo Glidewire into the aortic arch. The catheter was placed into the left common carotid artery and then advanced into the left internal carotid artery. The diagnostic catheter was removed. Frontal and lateral angiograms of the head were obtained.  FINDINGS: 1. Normal caliber of  the right common femoral artery, adequate for vascular access. 2. Occlusion of the distal left M2/MCA inferior division branch. 3. Patent superior division branch and bilateral ACAs. PROCEDURE: Using biplane roadmap, a Vecta 45 aspiration catheter was navigated over an Aristotle 24 microguidewire into the cavernous segment of the left ICA. The aspiration catheter was then advanced over the wire to the level of occlusion in the distal left M2 and connected to an aspiration pump. Continuous aspiration was performed for 2 minutes. The guide catheter was connected to a VacLok syringe. The aspiration catheter was subsequently removed under constant aspiration. The guide catheter was aspirated for debris. Left internal carotid artery angiograms with frontal lateral views of the head showed recanalization of the M2 segment with persistent occlusion of the M3 segment. Using biplane roadmap, a Vecta 45 aspiration catheter was navigated over an Aristotle 18 microguidewire into the cavernous segment of the left ICA. The aspiration catheter was then advanced over the wire to the level of occlusion in the left M3 and connected to an aspiration pump. Continuous aspiration was performed for 2 minutes. The guide catheter was connected to a VacLok syringe. The aspiration catheter was subsequently removed under constant aspiration. The guide catheter was aspirated for debris. Left internal carotid artery angiograms with frontal lateral views of the head showed persistent occlusion of the M3 segment. Using biplane roadmap guidance, a phenom 21 microcatheter was navigated over an Aristotle 18 microguidewire into the cavernous segment of the left ICA. The microcatheter was then navigated over the wire into the distal M3/MCA segment. Frontal and lateral angiograms with microcatheter contrast injection were obtained. Then, a 3 x 20 mm solitaire stent retriever was deployed spanning the distal M2 and M3  segments. The device was allowed to intercalated with the clot for 4 minutes. The thrombectomy device and microcatheter were removed under constant aspiration. Left internal carotid artery angiograms with frontal and lateral views of the head showed recanalization of the M3 segment. Flat panel CT of the head was obtained and post processed in a separate workstation with concurrent attending physician supervision. Selected images were sent to PACS. No evidence of hemorrhagic complication. The catheter was subsequently withdrawn. Right common femoral artery angiogram was obtained in right anterior oblique view. The puncture is at the level of the common femoral artery. The artery has normal caliber, adequate for closure device. The sheath was exchanged over the wire for a Perclose prostyle which was utilized for access closure. Immediate hemostasis was achieved. IMPRESSION: Successful and uncomplicated mechanical thrombectomy for treatment of a distal left M2/MCA posterior division branch occlusion. Complete recanalization achieved after 3 passes (TICI 3). PLAN: Transfer to ICU for post stroke care. Electronically Signed   By: Pedro Earls M.D.   On: 09/20/2021 08:54   IR US Guide Vasc Access Right  Result Date: 09/20/2021 INDICATION: Casey Gomillion. is a 66 year old male with past medical history significant for atrial fibrillation on anticoagulation which was held during current admission due to concern for GI bleed requiring transfusion, pancreatic cancer recently completed chemotherapy with possible metastasis to the liver status post biopsy pending results, admitted to [facility] long hospital on 08/27/2021 when he complained of fevers, generalized fatigue poor appetite and nausea. Patient headache acute onset of confusion and aphasia; NIHSS 11. Last known well at 1 a.m. on 09/18/2021. No thrombo lytics were administered given recent GI bleed and transfusions. Head CT showed no large acute  territorial core or hemorrhage. CT angiogram of the head and  neck was positive for left M2/MCA occlusion. EXAM: ULTRASOUND-GUIDED VASCULAR ACCESS DIAGNOSTIC CEREBRAL ANGIOGRAM MECHANICAL THROMBECTOMY FLAT PANEL HEAD CT COMPARISON:  CT/CT angiogram of the head and neck September 17, 2021. MEDICATIONS: No antibiotics administered. ANESTHESIA/SEDATION: The procedure was performed under general anesthesia. CONTRAST:  50 mL Omnipaque 300 milligram/mL FLUOROSCOPY: Radiation Exposure Index (as provided by the fluoroscopic device): 761 mGy Kerma COMPLICATIONS: None immediate. TECHNIQUE: Informed written consent was obtained from the patient's wife after a thorough discussion of the procedural risks, benefits and alternatives. All questions were addressed. Maximal Sterile Barrier Technique was utilized including caps, mask, sterile gowns, sterile gloves, sterile drape, hand hygiene and skin antiseptic. A timeout was performed prior to the initiation of the procedure. The right groin was prepped and draped in the usual sterile fashion. Using a micropuncture kit and the modified Seldinger technique, access was gained to the right common femoral artery and an 8 French sheath was placed. Real-time ultrasound guidance was utilized for vascular access including the acquisition of a permanent ultrasound image documenting patency of the accessed vessel. Under fluoroscopy, a Zoom 88 guide catheter was navigated over a 6 [place] Berenstein 2 catheter and a 0.035" Terumo Glidewire into the aortic arch. The catheter was placed into the left common carotid artery and then advanced into the left internal carotid artery. The diagnostic catheter was removed. Frontal and lateral angiograms of the head were obtained. FINDINGS: 1. Normal caliber of the right common femoral artery, adequate for vascular access. 2. Occlusion of the distal left M2/MCA inferior division branch. 3. Patent superior division branch and bilateral ACAs. PROCEDURE: Using  biplane roadmap, a Vecta 45 aspiration catheter was navigated over an Aristotle 24 microguidewire into the cavernous segment of the left ICA. The aspiration catheter was then advanced over the wire to the level of occlusion in the distal left M2 and connected to an aspiration pump. Continuous aspiration was performed for 2 minutes. The guide catheter was connected to a VacLok syringe. The aspiration catheter was subsequently removed under constant aspiration. The guide catheter was aspirated for debris. Left internal carotid artery angiograms with frontal lateral views of the head showed recanalization of the M2 segment with persistent occlusion of the M3 segment. Using biplane roadmap, a Vecta 45 aspiration catheter was navigated over an Aristotle 18 microguidewire into the cavernous segment of the left ICA. The aspiration catheter was then advanced over the wire to the level of occlusion in the left M3 and connected to an aspiration pump. Continuous aspiration was performed for 2 minutes. The guide catheter was connected to a VacLok syringe. The aspiration catheter was subsequently removed under constant aspiration. The guide catheter was aspirated for debris. Left internal carotid artery angiograms with frontal lateral views of the head showed persistent occlusion of the M3 segment. Using biplane roadmap guidance, a phenom 21 microcatheter was navigated over an Aristotle 18 microguidewire into the cavernous segment of the left ICA. The microcatheter was then navigated over the wire into the distal M3/MCA segment. Frontal and lateral angiograms with microcatheter contrast injection were obtained. Then, a 3 x 20 mm solitaire stent retriever was deployed spanning the distal M2 and M3 segments. The device was allowed to intercalated with the clot for 4 minutes. The thrombectomy device and microcatheter were removed under constant aspiration. Left internal carotid artery angiograms with frontal and lateral views of  the head showed recanalization of the M3 segment. Flat panel CT of the head was obtained and post processed in a separate workstation with concurrent  attending physician supervision. Selected images were sent to PACS. No evidence of hemorrhagic complication. The catheter was subsequently withdrawn. Right common femoral artery angiogram was obtained in right anterior oblique view. The puncture is at the level of the common femoral artery. The artery has normal caliber, adequate for closure device. The sheath was exchanged over the wire for a Perclose prostyle which was utilized for access closure. Immediate hemostasis was achieved. IMPRESSION: Successful and uncomplicated mechanical thrombectomy for treatment of a distal left M2/MCA posterior division branch occlusion. Complete recanalization achieved after 3 passes (TICI 3). PLAN: Transfer to ICU for post stroke care. Electronically Signed   By: Pedro Earls M.D.   On: 09/20/2021 08:54   IR CT Head Ltd  Result Date: 09/20/2021 INDICATION: Casey Ouch. is a 66 year old male with past medical history significant for atrial fibrillation on anticoagulation which was held during current admission due to concern for GI bleed requiring transfusion, pancreatic cancer recently completed chemotherapy with possible metastasis to the liver status post biopsy pending results, admitted to [facility] hospital on 08/18/2021 when he complained of fevers, generalized fatigue poor appetite and nausea. Patient headache acute onset of confusion and aphasia; NIHSS 11. Last known well at 1 a.m. on 09/25/2021. No thrombo lytics were administered given recent GI bleed and transfusions. Head CT showed no large acute territorial core or hemorrhage. CT angiogram of the head and neck was positive for left M2/MCA occlusion. EXAM: ULTRASOUND-GUIDED VASCULAR ACCESS DIAGNOSTIC CEREBRAL ANGIOGRAM MECHANICAL THROMBECTOMY FLAT PANEL HEAD CT COMPARISON:  CT/CT angiogram of the  head and neck September 17, 2021. MEDICATIONS: No antibiotics administered. ANESTHESIA/SEDATION: The procedure was performed under general anesthesia. CONTRAST:  50 mL Omnipaque 300 milligram/mL FLUOROSCOPY: Radiation Exposure Index (as provided by the fluoroscopic device): 595 mGy Kerma COMPLICATIONS: None immediate. TECHNIQUE: Informed written consent was obtained from the patient's wife after a thorough discussion of the procedural risks, benefits and alternatives. All questions were addressed. Maximal Sterile Barrier Technique was utilized including caps, mask, sterile gowns, sterile gloves, sterile drape, hand hygiene and skin antiseptic. A timeout was performed prior to the initiation of the procedure. The right groin was prepped and draped in the usual sterile fashion. Using a micropuncture kit and the modified Seldinger technique, access was gained to the right common femoral artery and an 8 French sheath was placed. Real-time ultrasound guidance was utilized for vascular access including the acquisition of a permanent ultrasound image documenting patency of the accessed vessel. Under fluoroscopy, a Zoom 88 guide catheter was navigated over a 6 [place] Berenstein 2 catheter and a 0.035" Terumo Glidewire into the aortic arch. The catheter was placed into the left common carotid artery and then advanced into the left internal carotid artery. The diagnostic catheter was removed. Frontal and lateral angiograms of the head were obtained. FINDINGS: 1. Normal caliber of the right common femoral artery, adequate for vascular access. 2. Occlusion of the distal left M2/MCA inferior division branch. 3. Patent superior division branch and bilateral ACAs. PROCEDURE: Using biplane roadmap, a Vecta 45 aspiration catheter was navigated over an Aristotle 24 microguidewire into the cavernous segment of the left ICA. The aspiration catheter was then advanced over the wire to the level of occlusion in the distal left M2 and connected  to an aspiration pump. Continuous aspiration was performed for 2 minutes. The guide catheter was connected to a VacLok syringe. The aspiration catheter was subsequently removed under constant aspiration. The guide catheter was aspirated for debris. Left internal  carotid artery angiograms with frontal lateral views of the head showed recanalization of the M2 segment with persistent occlusion of the M3 segment. Using biplane roadmap, a Vecta 45 aspiration catheter was navigated over an Aristotle 18 microguidewire into the cavernous segment of the left ICA. The aspiration catheter was then advanced over the wire to the level of occlusion in the left M3 and connected to an aspiration pump. Continuous aspiration was performed for 2 minutes. The guide catheter was connected to a VacLok syringe. The aspiration catheter was subsequently removed under constant aspiration. The guide catheter was aspirated for debris. Left internal carotid artery angiograms with frontal lateral views of the head showed persistent occlusion of the M3 segment. Using biplane roadmap guidance, a phenom 21 microcatheter was navigated over an Aristotle 18 microguidewire into the cavernous segment of the left ICA. The microcatheter was then navigated over the wire into the distal M3/MCA segment. Frontal and lateral angiograms with microcatheter contrast injection were obtained. Then, a 3 x 20 mm solitaire stent retriever was deployed spanning the distal M2 and M3 segments. The device was allowed to intercalated with the clot for 4 minutes. The thrombectomy device and microcatheter were removed under constant aspiration. Left internal carotid artery angiograms with frontal and lateral views of the head showed recanalization of the M3 segment. Flat panel CT of the head was obtained and post processed in a separate workstation with concurrent attending physician supervision. Selected images were sent to PACS. No evidence of hemorrhagic complication. The  catheter was subsequently withdrawn. Right common femoral artery angiogram was obtained in right anterior oblique view. The puncture is at the level of the common femoral artery. The artery has normal caliber, adequate for closure device. The sheath was exchanged over the wire for a Perclose prostyle which was utilized for access closure. Immediate hemostasis was achieved. IMPRESSION: Successful and uncomplicated mechanical thrombectomy for treatment of a distal left M2/MCA posterior division branch occlusion. Complete recanalization achieved after 3 passes (TICI 3). PLAN: Transfer to ICU for post stroke care. Electronically Signed   By: Pedro Earls M.D.   On: 09/20/2021 08:54   MR BRAIN WO CONTRAST  Result Date: 09/18/2021 CLINICAL DATA:  Follow-up examination for stroke, status post mechanical thrombectomy. EXAM: MRI HEAD WITHOUT CONTRAST TECHNIQUE: Multiplanar, multiecho pulse sequences of the brain and surrounding structures were obtained without intravenous contrast. COMPARISON:  Comparison made with multiple previous examinations from 10/12/2021. FINDINGS: Brain: Extensive confluent restricted diffusion seen throughout the left MCA territory, essentially involving the entirety of the left MCA distribution, consistent with large ischemic infarct. Involvement of the left basal ganglia including the left caudate and lentiform nuclei as well as the intervening left internal capsule. Additional scattered patchy involvement in ischemic changes noted within the left ACA territory as well. Superimposed subarachnoid hemorrhage involving the left sylvian fissure and overlying left frontal parietal region, similar to prior CT. Small volume petechial hemorrhage noted at the posterior left parieto-occipital region as well (series 14, image 35) Heidelberg classification 3c: Subarachnoid hemorrhage. Additionally, scattered patchy acute ischemic cortical and subcortical infarcts seen involving the  contralateral right cerebral hemisphere, primarily involving the right ACA distribution. Patchy involvement of the anterior genu of the corpus callosum. Few additional patchy foci in noted laterally within right frontal MCA territory as well. Additionally, patchy ischemic changes about the left greater than right occipital poles, consistent with PCA territory infarcts (series 5, image 71). Additionally, multifocal patchy ischemic infarcts noted involving the right cerebellum (series 14, image  31), with the largest area of ischemia measuring up to 1.9 cm. Minimal petechial blood products noted at this location as well (series 14, image 15). Few additional subcentimeter foci of diffusion abnormality within the contralateral left cerebellum are somewhat less dense, and could reflect acute to early subacute infarcts (series 5, images 58, 57) Extensive gyral swelling and edema about the large evolving left MCA territory infarct. Associated mass effect with early/mild 2 mm left-to-right shift. No hydrocephalus or trapping. Basilar cisterns remain patent. Underlying cerebral volume within normal limits. No mass lesion or extra-axial fluid collection. Pituitary gland suprasellar region within normal limits. Vascular: Loss of normal flow void within the left ICA to the terminus, consistent with interval occlusion (series 11, image 8). Absent flow voids within the left MCA territory, also consistent with occlusion. Left A1 segment also appears occluded. Remainder of the major intracranial vascular flow voids are otherwise maintained, although the hypoplastic left vertebral arteries not well seen. Skull and upper cervical spine: Craniocervical junction within normal limits. Diffusely decreased T1 signal intensity throughout the visualized bone marrow, likely related to patient's history of malignancy and therapy. No focal marrow replacing lesion. No scalp soft tissue abnormality. Sinuses/Orbits: Globes and orbital soft tissues  within normal limits. Mild scattered sphenoid ethmoidal and maxillary sinus disease. Trace right mastoid effusion noted. Other: None. IMPRESSION: 1. Large evolving left MCA distribution infarct, essentially involving the entirety of the left MCA territory. Developing regional mass effect with early 2 mm of left-to-right shift. 2. Superimposed subarachnoid hemorrhage centered about the left sylvian fissure, with additional scattered small volume petechial blood products as above. 3. Loss of normal flow voids within the left ICA, left MCA, and left A1 segment, compatible with interval occlusion. 4. Additional patchy acute ischemic infarcts involving the left ACA territory, contralateral right cerebral hemisphere, left greater than right PCA distributions, and right cerebellum. 5. Few additional subcentimeter acute to early subacute ischemic infarcts involving the left cerebellum. Electronically Signed   By: Jeannine Boga M.D.   On: 09/18/2021 03:14   CT HEAD WO CONTRAST (5MM)  Result Date: 09/18/2021 CLINICAL DATA:  Status post reperfusion of occluded left M2 segment. EXAM: CT HEAD WITHOUT CONTRAST TECHNIQUE: Contiguous axial images were obtained from the base of the skull through the vertex without intravenous contrast. RADIATION DOSE REDUCTION: This exam was performed according to the departmental dose-optimization program which includes automated exposure control, adjustment of the mA and/or kV according to patient size and/or use of iterative reconstruction technique. COMPARISON:  CT head without contrast 10/08/2021 at 2 a.m. CT images from IR suite at 5:13 a.m. FINDINGS: Brain: New subarachnoid hemorrhage is present within the left sylvian fissure. The left lentiform nucleus is no longer visualized. Insular ribbon is hypodense. Early loss of gray-white differentiation present in the posterior left MCA territory. The ventricles are of normal size. The brainstem and cerebellum are within normal limits.  Vascular: Asymmetric hyperdense left A1 and M1 segments noted. Skull: Calvarium is intact. No focal lytic or blastic lesions are present. No significant extracranial soft tissue lesion is present. Sinuses/Orbits: A polyp or mucous retention cyst is present the right maxillary sinus. The paranasal sinuses and mastoid air cells are otherwise clear. The globes and orbits are within normal limits. IMPRESSION: 1. New subarachnoid hemorrhage within the left Sylvian fissure. 2. Left lentiform nucleus no longer visualized suggesting infarct. 3. Early loss of gray-white differentiation in the posterior left MCA territory also likely represents infarct. 4. Asymmetric hyperdense left A1 and M1 segments. Question  reocclusion of the left ACA and MCA vessels. Critical Value/emergent results were called by telephone at the time of interpretation on 09/16/2021 at 7:17 pm to provider Dr. Rory Percy, who verbally acknowledged these results. Electronically Signed   By: San Morelle M.D.   On: 10/12/2021 19:21   CT HEAD CODE STROKE WO CONTRAST  Addendum Date: 10/15/2021   ADDENDUM REPORT: 10/09/2021 18:55 ADDENDUM: Results discussed by telephone on 10/16/2021 at 2:55 a.m. to provider Dr. Irene Pap. Electronically Signed   By: Jeannine Boga M.D.   On: 10/05/2021 18:55   Result Date: 10/11/2021 CLINICAL DATA:  Code stroke. Initial evaluation for neuro deficit, stroke suspected. EXAM: CT HEAD WITHOUT CONTRAST TECHNIQUE: Contiguous axial images were obtained from the base of the skull through the vertex without intravenous contrast. RADIATION DOSE REDUCTION: This exam was performed according to the departmental dose-optimization program which includes automated exposure control, adjustment of the mA and/or kV according to patient size and/or use of iterative reconstruction technique. COMPARISON:  Prior study from 05/04/2014. FINDINGS: Brain: Mild age-related cerebral atrophy with chronic small vessel ischemic disease. Small  focus of encephalomalacia at the left frontal parietal region consistent with a chronic ischemic infarct. Small remote lacunar infarct at the left lentiform nucleus. Additional tiny remote right cerebellar infarct. No acute intracranial hemorrhage. No visible acute large vessel territory infarct. No mass lesion, mass effect or midline shift. No hydrocephalus or extra-axial fluid collection. Vascular: No hyperdense vessel. Scattered vascular calcifications noted within the carotid siphons. Skull: Scalp soft tissues demonstrate no acute finding. Calvarium intact. Sinuses/Orbits: Optic drusen calcifications noted. Globes and orbital soft tissues demonstrate no acute finding. Mild pneumatized secretions noted within the sphenoid sinuses. Small right maxillary sinus retention cyst. No significant mastoid effusion. Other: None. ASPECTS [facility] Stroke Program Early CT Score) - Ganglionic level infarction (caudate, lentiform nuclei, internal capsule, insula, M1-M3 cortex): 7 - Supraganglionic infarction (M4-M6 cortex): 3 Total score (0-10 with 10 being normal): 10 IMPRESSION: 1. No acute intracranial abnormality. 2. ASPECTS is 10. 3. Atrophy with mild chronic small vessel ischemic disease, with small remote infarcts involving the left frontoparietal junction, left lentiform nucleus, and right cerebellum. Electronically Signed: By: Jeannine Boga M.D. On: 09/16/2021 02:27   ECHOCARDIOGRAM COMPLETE  Result Date: 10/02/2021    ECHOCARDIOGRAM REPORT   Patient Name:   Casey Cargile. Date of Exam: 10/07/2021 Medical Rec #:  [id number]          Height:       65.0 in Accession #:    [id number]         Weight:       160.1 lb Date of Birth:  1955/10/04          BSA:          1.799 m Patient Age:    16 years           BP:           124/79 mmHg Patient Gender: M                  HR:           93 bpm. Exam Location:  Inpatient Procedure: 2D Echo, Cardiac Doppler and Color Doppler Indications:    Stroke  History:         Patient has no prior history of Echocardiogram examinations.                 CAD, TIA; Arrythmias:Atrial Fibrillation.  Sonographer:  Jyl Heinz Referring Phys: [id number] ASHISH ARORA IMPRESSIONS  1. Left ventricular ejection fraction, by estimation, is 50 to 55%. The left ventricle has low normal function. The left ventricle has no regional wall motion abnormalities. The left ventricular internal cavity size was mildly dilated. Left ventricular diastolic parameters are indeterminate.  2. Right ventricular systolic function is normal. The right ventricular size is normal.  3. Left atrial size was mildly dilated.  4. The mitral valve is abnormal. Moderate to severe mitral valve regurgitation. No evidence of mitral stenosis.  5. The aortic valve is tricuspid. There is moderate calcification of the aortic valve. There is moderate thickening of the aortic valve. Aortic valve regurgitation is not visualized. Aortic valve sclerosis is present, with no evidence of aortic valve stenosis.  6. The inferior vena cava is dilated in size with >50% respiratory variability, suggesting right atrial pressure of 8 mmHg. FINDINGS  Left Ventricle: Left ventricular ejection fraction, by estimation, is 50 to 55%. The left ventricle has low normal function. The left ventricle has no regional wall motion abnormalities. The left ventricular internal cavity size was mildly dilated. There is no left ventricular hypertrophy. Left ventricular diastolic parameters are indeterminate. Right Ventricle: The right ventricular size is normal. No increase in right ventricular wall thickness. Right ventricular systolic function is normal. Left Atrium: Left atrial size was mildly dilated. Right Atrium: Right atrial size was normal in size. Pericardium: There is no evidence of pericardial effusion. Mitral Valve: The mitral valve is abnormal. There is mild thickening of the mitral valve leaflet(s). There is mild calcification of the mitral valve  leaflet(s). Moderate to severe mitral valve regurgitation. No evidence of mitral valve stenosis. Tricuspid Valve: The tricuspid valve is normal in structure. Tricuspid valve regurgitation is mild . No evidence of tricuspid stenosis. Aortic Valve: The aortic valve is tricuspid. There is moderate calcification of the aortic valve. There is moderate thickening of the aortic valve. Aortic valve regurgitation is not visualized. Aortic valve sclerosis is present, with no evidence of aortic valve stenosis. Aortic valve peak gradient measures 7.1 mmHg. Pulmonic Valve: The pulmonic valve was normal in structure. Pulmonic valve regurgitation is not visualized. No evidence of pulmonic stenosis. Aorta: The aortic root is normal in size and structure. Venous: The inferior vena cava is dilated in size with greater than 50% respiratory variability, suggesting right atrial pressure of 8 mmHg. IAS/Shunts: No atrial level shunt detected by color flow Doppler.  LEFT VENTRICLE PLAX 2D LVIDd:         4.70 cm     Diastology LVIDs:         3.50 cm     LV e' medial:    11.70 cm/s LV PW:         0.90 cm     LV E/e' medial:  8.3 LV IVS:        0.90 cm     LV e' lateral:   16.80 cm/s LVOT diam:     2.00 cm     LV E/e' lateral: 5.8 LV SV:         42 LV SV Index:   24 LVOT Area:     3.14 cm  LV Volumes (MOD) LV vol d, MOD A2C: 95.8 ml LV vol d, MOD A4C: 82.8 ml LV vol s, MOD A2C: 38.9 ml LV vol s, MOD A4C: 34.1 ml LV SV MOD A2C:     56.9 ml LV SV MOD A4C:     82.8 ml LV SV  MOD BP:      52.4 ml RIGHT VENTRICLE             IVC RV Basal diam:  2.10 cm     IVC diam: 2.00 cm RV Mid diam:    1.50 cm RV S prime:     11.20 cm/s TAPSE (M-mode): 1.3 cm LEFT ATRIUM             Index        RIGHT ATRIUM           Index LA diam:        4.20 cm 2.33 cm/m   RA Area:     10.10 cm LA Vol (A2C):   52.1 ml 28.95 ml/m  RA Volume:   17.10 ml  9.50 ml/m LA Vol (A4C):   74.2 ml 41.24 ml/m LA Biplane Vol: 64.3 ml 35.73 ml/m  AORTIC VALVE AV Area (Vmax): 1.97  cm AV Vmax:        133.00 cm/s AV Peak Grad:   7.1 mmHg LVOT Vmax:      83.50 cm/s LVOT Vmean:     60.200 cm/s LVOT VTI:       0.135 m  AORTA Ao Root diam: 3.20 cm Ao Asc diam:  3.00 cm MITRAL VALVE               TRICUSPID VALVE MV Area (PHT): 5.75 cm    TV Peak grad:   42.5 mmHg MV Decel Time: 132 msec    TV Vmax:        3.26 m/s MR Peak grad: 110.8 mmHg   TR Peak grad:   31.6 mmHg MR Mean grad: 83.0 mmHg    TR Vmax:        281.00 cm/s MR Vmax:      526.33 cm/s MR Vmean:     443.0 cm/s   SHUNTS MV E velocity: 97.50 cm/s  Systemic VTI:  0.14 m                            Systemic Diam: 2.00 cm Jenkins Rouge MD Electronically signed by Jenkins Rouge MD Signature Date/Time: 09/24/2021/11:39:48 AM    Final    CT ANGIO HEAD NECK W WO CM W PERF (CODE STROKE)  Result Date: 09/27/2021 CLINICAL DATA:  Initial evaluation for neuro deficit, stroke suspected. EXAM: CT ANGIOGRAPHY HEAD AND NECK CT PERFUSION BRAIN TECHNIQUE: Multidetector CT imaging of the head and neck was performed using the standard protocol during bolus administration of intravenous contrast. Multiplanar CT image reconstructions and MIPs were obtained to evaluate the vascular anatomy. Carotid stenosis measurements (when applicable) are obtained utilizing NASCET criteria, using the distal internal carotid diameter as the denominator. Multiphase CT imaging of the brain was performed following IV bolus contrast injection. Subsequent parametric perfusion maps were calculated using RAPID software. RADIATION DOSE REDUCTION: This exam was performed according to the departmental dose-optimization program which includes automated exposure control, adjustment of the mA and/or kV according to patient size and/or use of iterative reconstruction technique. CONTRAST:  125m OMNIPAQUE IOHEXOL 350 MG/ML SOLN COMPARISON:  CT from earlier the same day. FINDINGS: CTA NECK FINDINGS Aortic arch: Visualized aortic arch normal in caliber with standard 3 vessel morphology. No  stenosis seen about the origin the great vessels. Right carotid system: Right common and internal carotid arteries patent without stenosis or dissection. Left carotid system: Left CCA patent from its origin to the bifurcation. Mild eccentric  plaque about the left carotid bulb without significant stenosis. Left ICA patent distally without stenosis or dissection. Vertebral arteries: Both vertebral arteries arise from subclavian arteries. No proximal subclavian artery stenosis. Strongly dominant right vertebral artery with a diffusely hypoplastic left vertebral artery. Vertebral arteries patent without stenosis or dissection. Skeleton: No discrete or worrisome osseous lesions. Moderate spondylosis present at C5-6. Other neck: No other acute soft tissue abnormality within the neck. Upper chest: Moderate to large layering bilateral pleural effusions, partially visualized. Associated atelectasis. Left-sided Port-A-Cath noted. Review of the MIP images confirms the above findings CTA HEAD FINDINGS Anterior circulation: Petrous segments patent bilaterally. Atheromatous change within the carotid siphons without hemodynamically significant stenosis. Left A1 segment dominant and widely patent. Right A1 hypoplastic and/or absent, accounting for the slightly diminutive right ICA is compared to the left. Normal anterior communicating complex. Anterior cerebral arteries patent to their distal aspects without stenosis. No M1 stenosis or occlusion. Distal right MCA branches well perfused. On the left, there is acute occlusion of a left M2 branch, inferior division (series 12, image 33). Some intermittent perfusion is seen distally, which could be related to subocclusive thrombus and/or collateralization Posterior circulation: Mild nonstenotic plaque noted within the dominant right V4 segment. Right PICA patent. Hypoplastic left vertebral artery largely terminates in PICA. Left PICA patent as well. Basilar patent to its distal aspect  without stenosis. Superior cerebral arteries patent bilaterally. Left PCA supplied via the basilar. Predominant fetal type origin of the right PCA. Both PCAs remain patent to their distal aspects. Venous sinuses: Patent allowing for timing the contrast bolus. Anatomic variants: As above.  No aneurysm. Review of the MIP images confirms the above findings CT Brain Perfusion Findings: ASPECTS: 10 CBF (<30%) Volume: 58m Perfusion (Tmax>6.0s) volume: 448mMismatch Volume: 4064mnfarction Location:No acute core infarct evident by CT perfusion. 40 cc perfusion deficit involving the posterior left temporal occipital region, posterior left MCA distribution, in keeping with the above seen MCA branch occlusion. IMPRESSION: 1. Positive CTA with acute left M2 occlusion as above. 2. 40 cc perfusion deficit involving the posterior left temporoccipital region, in keeping with the above occlusion. No core infarct evident by CT perfusion. 3. Underlying mild for age atherosclerotic change elsewhere about the major arterial vasculature of the head and neck. No other hemodynamically significant or correctable stenosis. 4. Moderate to large layering bilateral pleural effusions with associated atelectasis. Critical Value/emergent results were called by telephone at the time of interpretation on 10/12/2021 at 2:55/am [facility] these results. Electronically Signed   By: BenJeannine BogaD.   On: 09/29/2021 03:13   US Korearacentesis  Result Date: 09/15/2021 INDICATION: 66 6ar old male with history of pancreatic cancer and presumed multifocal hepatic metastases with small volume ascites. EXAM: ULTRASOUND GUIDED  PARACENTESIS MEDICATIONS: None. COMPLICATIONS: None immediate. PROCEDURE: Informed written consent was obtained from the patient after a discussion of the risks, benefits and alternatives to treatment. A timeout was performed prior to the initiation of the procedure. Initial ultrasound  scanning demonstrates a small amount of ascites within the right lower abdominal quadrant. The right lower abdomen was prepped and draped in the usual sterile fashion. 1% lidocaine was used for local anesthesia. Following this, a 19 gauge, 7-cm, Yueh catheter was introduced. An ultrasound image was saved for documentation purposes. The paracentesis was performed. The catheter was removed and a dressing was applied. The patient tolerated the procedure well without immediate post procedural complication. FINDINGS: A total of approximately 100 mL  of translucent, straw-colored fluid was removed. Samples were sent to the laboratory as requested by the clinical team. IMPRESSION: Successful ultrasound-guided paracentesis yielding 100 mL of peritoneal fluid. Ruthann Cancer, MD [facility] Radiology Electronically Signed   By: Ruthann Cancer M.D.   On: 09/15/2021 15:44   US BIOPSY (LIVER)  Result Date: 09/15/2021 INDICATION: 66 year old male with history of pancreatic cancer presenting with multifocal hepatic masses of indeterminate etiology. EXAM: ULTRASOUND GUIDED LIVER LESION BIOPSY COMPARISON:  None Available. MEDICATIONS: None ANESTHESIA/SEDATION: Fentanyl 50 mcg IV; Versed 2 mg IV Total Moderate Sedation time:  12 minutes. The patient's level of consciousness and vital signs were monitored continuously by radiology nursing throughout the procedure under my direct supervision. COMPLICATIONS: None immediate. PROCEDURE: Informed written consent was obtained from the patient after a discussion of the risks, benefits and alternatives to treatment. The patient understands and consents the procedure. A timeout was performed prior to the initiation of the procedure. Ultrasound scanning was performed of the right upper abdominal quadrant demonstrates multifocal subcapsular hypoechoic masses in the anterior left lobe of the liver. A prominent anterior left lobe mass was selected  for biopsy and the procedure was planned. The right upper abdominal quadrant was prepped and draped in the usual sterile fashion. The overlying soft tissues were anesthetized with 1% lidocaine with epinephrine. A 17 gauge, 6.8 cm co-axial needle was advanced into a peripheral aspect of the lesion. This was followed by total of 3 core biopsies with an 18 gauge core device under direct ultrasound guidance. The coaxial needle tract was embolized with a small amount of Gel-Foam slurry and superficial hemostasis was obtained with manual compression. Post procedural scanning was negative for definitive area of hemorrhage or additional complication. A dressing was placed. The patient tolerated the procedure well without immediate post procedural complication. IMPRESSION: Technically successful ultrasound guided core needle biopsy of anterior left lobe liver mass. Ruthann Cancer, MD [facility] Radiology Electronically Signed   By: Ruthann Cancer M.D.   On: 09/15/2021 15:42   MR 3D Recon At Scanner  Result Date: 09/14/2021 CLINICAL DATA:  Follow-up pancreatic carcinoma. New liver lesions on recent CT. EXAM: MRI ABDOMEN WITHOUT AND WITH CONTRAST (INCLUDING MRCP) TECHNIQUE: Multiplanar multisequence MR imaging of the abdomen was performed both before and after the administration of intravenous contrast. Heavily T2-weighted images of the biliary and pancreatic ducts were obtained, and three-dimensional MRCP images were rendered by post processing. CONTRAST:  44mL GADAVIST GADOBUTROL 1 MMOL/ML IV SOLN COMPARISON:  CT on 08/05/2021 FINDINGS: Lower chest: Tiny bilateral pleural effusions. Hepatobiliary: Innumerable rim enhancing masses are seen throughout the liver which are new and consistent with diffuse liver metastases. Gallbladder is unremarkable. No evidence of biliary ductal dilatation, with common bile duct stent remains in place. Pancreas: Soft tissue mass in the  pancreatic head surrounding the common bile duct stent measures 5.9 x 5.0 cm, significantly increased in size since prior study. Diffuse pancreatic ductal dilatation is also noted. Spleen:  Within normal limits in size and appearance. Adrenals/Urinary Tract: No masses identified. No evidence of hydronephrosis. Stomach/Bowel: Unremarkable. Vascular/Lymphatic: No pathologically enlarged lymph nodes identified. No acute vascular findings. Other:  Mild-to-moderate ascites is new since previous study. Musculoskeletal:  No suspicious bone lesions identified. IMPRESSION: New diffuse liver metastases. Increased size of soft tissue mass in the pancreatic head surrounding the common bile duct stent, consistent with primary pancreatic carcinoma. New mild-to-moderate ascites and tiny bilateral pleural effusions. Electronically Signed  By: Marlaine Hind M.D.   On: 09/14/2021 15:05   MR ABDOMEN MRCP W WO CONTAST  Result Date: 09/14/2021 CLINICAL DATA:  Follow-up pancreatic carcinoma. New liver lesions on recent CT. EXAM: MRI ABDOMEN WITHOUT AND WITH CONTRAST (INCLUDING MRCP) TECHNIQUE: Multiplanar multisequence MR imaging of the abdomen was performed both before and after the administration of intravenous contrast. Heavily T2-weighted images of the biliary and pancreatic ducts were obtained, and three-dimensional MRCP images were rendered by post processing. CONTRAST:  70mL GADAVIST GADOBUTROL 1 MMOL/ML IV SOLN COMPARISON:  CT on 08/05/2021 FINDINGS: Lower chest: Tiny bilateral pleural effusions. Hepatobiliary: Innumerable rim enhancing masses are seen throughout the liver which are new and consistent with diffuse liver metastases. Gallbladder is unremarkable. No evidence of biliary ductal dilatation, with common bile duct stent remains in place. Pancreas: Soft tissue mass in the pancreatic head surrounding the common bile duct stent measures 5.9 x 5.0 cm, significantly increased in size since prior study. Diffuse pancreatic  ductal dilatation is also noted. Spleen:  Within normal limits in size and appearance. Adrenals/Urinary Tract: No masses identified. No evidence of hydronephrosis. Stomach/Bowel: Unremarkable. Vascular/Lymphatic: No pathologically enlarged lymph nodes identified. No acute vascular findings. Other:  Mild-to-moderate ascites is new since previous study. Musculoskeletal:  No suspicious bone lesions identified. IMPRESSION: New diffuse liver metastases. Increased size of soft tissue mass in the pancreatic head surrounding the common bile duct stent, consistent with primary pancreatic carcinoma. New mild-to-moderate ascites and tiny bilateral pleural effusions. Electronically Signed   By: Marlaine Hind M.D.   On: 09/14/2021 15:05   US Abdomen Limited RUQ (LIVER/GB)  Result Date: 08/29/2021 CLINICAL DATA:  Gallbladder wall thickening on CT. History of pancreatic cancer. EXAM: ULTRASOUND ABDOMEN LIMITED RIGHT UPPER QUADRANT COMPARISON:  CT of earlier today FINDINGS: Gallbladder: No gallstones. Gallbladder wall thickening at up to 7 mm. Gas within the gallbladder is likely related to common duct stent in place. Trace pericholecystic fluid. Sonographic Murphy's sign could not be evaluated as patient had received pain medications. Common bile duct: Diameter: Dilated at 16 mm. Common duct stent in place on today's CT. Liver: Multiple liver lesions, consistent with metastatic disease as on CT today. Portal vein is patent on color Doppler imaging with normal direction of blood flow towards the liver. Other: None. IMPRESSION: No gallstones. Gallbladder wall thickening and small volume pericholecystic fluid are nonspecific. Patient had received pain meds prior to scanning, therefore tenderness to gallbladder palpation could not be evaluated. Hepatic metastasis, as on today's CT. Electronically Signed   By: Abigail Miyamoto M.D.   On: 09/03/2021 17:08   CT CHEST ABDOMEN PELVIS W CONTRAST  Result Date: 08/18/2021 CLINICAL DATA:   Pancreatic cancer; intermittent fever and black tarry stools; * Tracking Code: BO * EXAM: CT CHEST, ABDOMEN, AND PELVIS WITH CONTRAST TECHNIQUE: Multidetector CT imaging of the chest, abdomen and pelvis was performed following the standard protocol during bolus administration of intravenous contrast. RADIATION DOSE REDUCTION: This exam was performed according to the departmental dose-optimization program which includes automated exposure control, adjustment of the mA and/or kV according to patient size and/or use of iterative reconstruction technique. CONTRAST:  162mL OMNIPAQUE IOHEXOL 300 MG/ML  SOLN COMPARISON:  CT abdomen and pelvis dated Aug 05, 2021 FINDINGS: CT CHEST FINDINGS Cardiovascular: Normal heart size. No pericardial effusion. Severe three-vessel coronary artery calcifications. Normal caliber thoracic aorta with mild atherosclerotic disease. Mediastinum/Nodes: Small hiatal hernia. Thyroid is unremarkable. No pathologically enlarged lymph nodes seen in the chest. Lungs/Pleura: Central airways are patent. No  consolidation, pleural effusion or pneumothorax. Bibasilar atelectasis. Musculoskeletal: No chest wall mass or suspicious bone lesions identified. CT ABDOMEN PELVIS FINDINGS Hepatobiliary: Numerous new ill-defined peripherally enhancing liver lesions are seen. Reference lesion of the left hepatic lobe measuring 15 x 13 mm on series 2, image 55. Reference new lesion of the right hepatic lobe measuring 17 x 16 mm on image 65. Decompressed gallbladder with mild wall thickening. Common bile duct stents in place with associated pneumobilia. No intrahepatic biliary ductal dilation. Pancreas: Ill-defined masslike area of the pancreatic head measuring 3.2 x 3.6 cm on image 68, new when compared to the prior exam. Spleen: Normal in size without focal abnormality. Adrenals/Urinary Tract: Adrenal glands are unremarkable. Kidneys are normal, without renal calculi, focal lesion, or hydronephrosis. Bladder is  unremarkable. Stomach/Bowel: Stomach is within normal limits. Appendix appears normal. Stool ball in the rectum with no rectal wall thickening. Mild wall thickening of the duodenum. No evidence of obstruction. Vascular/Lymphatic: Aortic atherosclerosis. No enlarged abdominal or pelvic lymph nodes. Reproductive: Mild prostatomegaly. Other: New trace ascites.  No free air. Musculoskeletal: No acute or significant osseous findings. IMPRESSION: 1. New masslike area of the pancreatic head, concerning for progressive disease. 2. Multiple new irregular peripherally enhancing liver lesions, concerning for hepatic metastatic disease. 3. New trace abdominal ascites, concerning for peritoneal carcinomatosis. 4. Mild wall thickening of the duodenum, findings can be seen in the setting of duodenitis. 5. Common bile duct stents in place with associated pneumobilia. No intrahepatic biliary ductal dilation. 6. Decompressed gallbladder with mild wall thickening, likely reactive. If there is clinical concern for acute cholecystitis, gallbladder ultrasound could be performed for further evaluation. 7. Aortic Atherosclerosis (ICD10-I70.0). Electronically Signed   By: Yetta Glassman M.D.   On: 09/09/2021 11:20   DG Chest 2 View  Result Date: 09/11/2021 CLINICAL DATA:  Fever and weakness EXAM: CHEST - 2 VIEW COMPARISON:  09/10/2021 FINDINGS: Left porta catheter with tip at the upper cavoatrial junction. Normal heart size and mediastinal contours. Coronary stenting. No acute infiltrate or edema. No effusion or pneumothorax. No acute osseous findings. Artifact from EKG leads. IMPRESSION: Negative for pneumonia. Electronically Signed   By: Jorje Guild M.D.   On: 09/04/2021 06:27   DG Chest 2 View  Result Date: 09/10/2021 CLINICAL DATA:  Suspected sepsis EXAM: CHEST - 2 VIEW COMPARISON:  04/18/2021 FINDINGS: Left Port-A-Cath remains in place, unchanged. Heart and mediastinal contours are within normal limits. No focal opacities  or effusions. No acute bony abnormality. IMPRESSION: No active cardiopulmonary disease. Electronically Signed   By: Rolm Baptise M.D.   On: 09/10/2021 23:34    Microbiology No results found for this or any previous visit (from the past 240 hour(s)).  Lab Basic Metabolic Panel: No results for input(s): "NA", "K", "CL", "CO2", "GLUCOSE", "BUN", "CREATININE", "CALCIUM", "MG", "PHOS" in the last 168 hours. Liver Function Tests: No results for input(s): "AST", "ALT", "ALKPHOS", "BILITOT", "PROT", "ALBUMIN" in the last 168 hours. No results for input(s): "LIPASE", "AMYLASE" in the last 168 hours. No results for input(s): "AMMONIA" in the last 168 hours. CBC: No results for input(s): "WBC", "NEUTROABS", "HGB", "HCT", "MCV", "PLT" in the last 168 hours. Cardiac Enzymes: No results for input(s): "CKTOTAL", "CKMB", "CKMBINDEX", "TROPONINI" in the last 168 hours. Sepsis Labs: No results for input(s): "PROCALCITON", "WBC", "LATICACIDVEN" in the last 168 hours.   Candee Furbish 09/27/2021, 4:35 PM

## 2021-10-17 NOTE — Progress Notes (Signed)
Pt passed at 1027 am Cannon Quinton,RN and Berna RN called time of death together. MD paged and notified family at bedside.

## 2021-10-17 NOTE — Progress Notes (Signed)
SLP Cancellation Note  Patient Details Name: Casey Pierce. MRN: [id number] DOB: 18-Feb-1956   Cancelled treatment:       Reason Eval/Treat Not Completed: Other (comment) Order received for cognitive-linguistic eval. Note events in chart with transition to full comfort care, on morphine drip. SLP to sign off for now.    Osie Bond., M.A. [place] Office [phone]  Secure chat preferred  10/12/21, 8:38 AM

## 2021-10-17 NOTE — Progress Notes (Signed)
At 0048, patient started coughing uncontrollably, robinul given and suctioned mouth, no secretions suctioned, morphine increased to 15 mg/hr, bolus administered per order, breathing treatment given. Family frantic at bedside, explained comfort measures taken and purposes.

## 2021-10-17 NOTE — Progress Notes (Signed)
Family members at bedside, comforting patient.  Family has requested to not draw blood or do further labs.

## 2021-10-17 NOTE — Progress Notes (Signed)
Wasted about 65 ml morphine drip witnessed by Brynda Greathouse, RN

## 2021-10-17 DEATH — deceased
